# Patient Record
Sex: Female | Born: 1945 | Race: White | Hispanic: No | Marital: Married | Smoking: Current every day smoker
Health system: Southern US, Community
[De-identification: ages and names within clinical notes are randomized; demographics above are authoritative.]

## PROBLEM LIST (undated history)

## (undated) DIAGNOSIS — E785 Hyperlipidemia, unspecified: Secondary | ICD-10-CM

## (undated) DIAGNOSIS — F419 Anxiety disorder, unspecified: Secondary | ICD-10-CM

## (undated) DIAGNOSIS — I2510 Atherosclerotic heart disease of native coronary artery without angina pectoris: Secondary | ICD-10-CM

## (undated) DIAGNOSIS — Z9289 Personal history of other medical treatment: Secondary | ICD-10-CM

## (undated) DIAGNOSIS — G629 Polyneuropathy, unspecified: Secondary | ICD-10-CM

## (undated) DIAGNOSIS — E46 Unspecified protein-calorie malnutrition: Secondary | ICD-10-CM

## (undated) DIAGNOSIS — M810 Age-related osteoporosis without current pathological fracture: Secondary | ICD-10-CM

## (undated) DIAGNOSIS — I771 Stricture of artery: Secondary | ICD-10-CM

## (undated) DIAGNOSIS — M797 Fibromyalgia: Secondary | ICD-10-CM

## (undated) DIAGNOSIS — I10 Essential (primary) hypertension: Secondary | ICD-10-CM

## (undated) DIAGNOSIS — I6529 Occlusion and stenosis of unspecified carotid artery: Secondary | ICD-10-CM

## (undated) DIAGNOSIS — R55 Syncope and collapse: Secondary | ICD-10-CM

## (undated) DIAGNOSIS — J9620 Acute and chronic respiratory failure, unspecified whether with hypoxia or hypercapnia: Secondary | ICD-10-CM

## (undated) DIAGNOSIS — I5030 Unspecified diastolic (congestive) heart failure: Secondary | ICD-10-CM

## (undated) HISTORY — DX: Atherosclerotic heart disease of native coronary artery without angina pectoris: I25.10

## (undated) HISTORY — DX: Unspecified protein-calorie malnutrition: E46

## (undated) HISTORY — DX: Personal history of other medical treatment: Z92.89

## (undated) HISTORY — DX: Acute and chronic respiratory failure, unspecified whether with hypoxia or hypercapnia: J96.20

## (undated) HISTORY — DX: Occlusion and stenosis of unspecified carotid artery: I65.29

## (undated) HISTORY — DX: Age-related osteoporosis without current pathological fracture: M81.0

## (undated) HISTORY — DX: Polyneuropathy, unspecified: G62.9

## (undated) HISTORY — PX: OTHER SURGICAL HISTORY: SHX169

## (undated) HISTORY — DX: Anxiety disorder, unspecified: F41.9

## (undated) HISTORY — DX: Syncope and collapse: R55

## (undated) HISTORY — DX: Essential (primary) hypertension: I10

## (undated) HISTORY — DX: Fibromyalgia: M79.7

## (undated) HISTORY — DX: Hyperlipidemia, unspecified: E78.5

---

## 1998-06-06 ENCOUNTER — Other Ambulatory Visit: Admission: RE | Admit: 1998-06-06 | Discharge: 1998-06-06 | Payer: Self-pay | Admitting: *Deleted

## 1998-11-10 ENCOUNTER — Ambulatory Visit (HOSPITAL_COMMUNITY): Admission: RE | Admit: 1998-11-10 | Discharge: 1998-11-10 | Payer: Self-pay | Admitting: Family Medicine

## 2000-07-10 ENCOUNTER — Other Ambulatory Visit: Admission: RE | Admit: 2000-07-10 | Discharge: 2000-07-10 | Payer: Self-pay | Admitting: *Deleted

## 2001-03-03 ENCOUNTER — Encounter: Admission: RE | Admit: 2001-03-03 | Discharge: 2001-03-03 | Payer: Self-pay | Admitting: Family Medicine

## 2001-03-03 ENCOUNTER — Encounter: Payer: Self-pay | Admitting: Family Medicine

## 2001-05-27 ENCOUNTER — Other Ambulatory Visit: Admission: RE | Admit: 2001-05-27 | Discharge: 2001-05-27 | Payer: Self-pay | Admitting: *Deleted

## 2002-07-27 ENCOUNTER — Other Ambulatory Visit: Admission: RE | Admit: 2002-07-27 | Discharge: 2002-07-27 | Payer: Self-pay | Admitting: Family Medicine

## 2003-09-15 ENCOUNTER — Other Ambulatory Visit: Admission: RE | Admit: 2003-09-15 | Discharge: 2003-09-15 | Payer: Self-pay | Admitting: Family Medicine

## 2004-02-03 ENCOUNTER — Encounter: Admission: RE | Admit: 2004-02-03 | Discharge: 2004-02-03 | Payer: Self-pay | Admitting: Family Medicine

## 2005-03-27 ENCOUNTER — Other Ambulatory Visit: Admission: RE | Admit: 2005-03-27 | Discharge: 2005-03-27 | Payer: Self-pay | Admitting: Family Medicine

## 2006-06-25 ENCOUNTER — Ambulatory Visit: Payer: Self-pay | Admitting: Gastroenterology

## 2006-07-08 ENCOUNTER — Ambulatory Visit: Payer: Self-pay | Admitting: Gastroenterology

## 2006-07-08 ENCOUNTER — Encounter: Payer: Self-pay | Admitting: Gastroenterology

## 2006-09-04 ENCOUNTER — Emergency Department (HOSPITAL_COMMUNITY): Admission: EM | Admit: 2006-09-04 | Discharge: 2006-09-04 | Payer: Self-pay | Admitting: Emergency Medicine

## 2007-10-02 ENCOUNTER — Ambulatory Visit (HOSPITAL_COMMUNITY): Admission: RE | Admit: 2007-10-02 | Discharge: 2007-10-02 | Payer: Self-pay | Admitting: Internal Medicine

## 2009-08-15 DIAGNOSIS — R55 Syncope and collapse: Secondary | ICD-10-CM

## 2009-08-15 HISTORY — DX: Syncope and collapse: R55

## 2009-09-12 ENCOUNTER — Ambulatory Visit: Payer: Self-pay | Admitting: Cardiology

## 2009-09-28 ENCOUNTER — Telehealth (INDEPENDENT_AMBULATORY_CARE_PROVIDER_SITE_OTHER): Payer: Self-pay | Admitting: *Deleted

## 2009-10-26 ENCOUNTER — Encounter: Payer: Self-pay | Admitting: Cardiology

## 2009-10-26 ENCOUNTER — Ambulatory Visit: Payer: Self-pay | Admitting: Cardiovascular Disease

## 2009-10-26 ENCOUNTER — Ambulatory Visit: Payer: Self-pay

## 2009-10-26 ENCOUNTER — Ambulatory Visit (HOSPITAL_COMMUNITY): Admission: RE | Admit: 2009-10-26 | Discharge: 2009-10-26 | Payer: Self-pay | Admitting: Cardiology

## 2009-12-05 ENCOUNTER — Ambulatory Visit: Payer: Self-pay | Admitting: Cardiology

## 2009-12-29 ENCOUNTER — Ambulatory Visit: Payer: Self-pay

## 2009-12-29 ENCOUNTER — Ambulatory Visit: Payer: Self-pay | Admitting: Cardiology

## 2009-12-29 ENCOUNTER — Ambulatory Visit (HOSPITAL_COMMUNITY): Admission: RE | Admit: 2009-12-29 | Discharge: 2009-12-29 | Payer: Self-pay | Admitting: Cardiology

## 2009-12-29 ENCOUNTER — Encounter: Payer: Self-pay | Admitting: Cardiology

## 2010-11-16 NOTE — Assessment & Plan Note (Signed)
Summary: 3 month rov/sl   CC:  pt complains of being unbalanced.  History of Present Illness: Pleasant female I saw in November of 2010 for syncope. I felt at that time it was most likely either cough syncope or vasovagal. A stress echocardiogram was performed in January of 2011 and revealed poor exercise tolerance. There were electrocardiographic changes but there were no wall motion abnormalities and the LV function was normal. Since I saw her previously she feels unsteady at times but denies any dyspnea on exertion, orthopnea, PND, pedal edema, palpitations, syncope or chest pain.  Current Medications (verified): 1)  Lorazepam 1 Mg Tabs (Lorazepam) .Marland Kitchen.. 1 Tab By Mouth At Bedtime 2)  Vitamin D (Ergocalciferol) 50000 Unit Caps (Ergocalciferol) .... Weekly 3)  Aspirin 81 Mg Tbec (Aspirin) .... Take One Tablet By Mouth Daily When Pt Remebers  Past History:  Past Medical History: Reviewed history from 09/12/2009 and no changes required. hyperlipidemia history of anxiety fibromyalgia osteoporosis  Past Surgical History: Reviewed history from 09/12/2009 and no changes required. tonsillectomy  Social History: Reviewed history from 09/12/2009 and no changes required. Tobacco Use - Yes.  Alcohol Use - yes-occasional Drug Use - no Retired  Married   Review of Systems       Pains from Fibromyalgia but no fevers or chills, productive cough, hemoptysis, dysphasia, odynophagia, melena, hematochezia, dysuria, hematuria, rash, seizure activity, orthopnea, PND, pedal edema, claudication. Remaining systems are negative.   Vital Signs:  Patient profile:   65 year old female Height:      59 inches Weight:      92 pounds BMI:     18.65 Pulse rate:   89 / minute Resp:     12 per minute BP sitting:   100 / 63  (left arm)  Vitals Entered By: Burnett Kanaris (December 05, 2009 1:53 PM)  Physical Exam  General:  Well-developed well-nourished in no acute distress.  Skin is warm and dry.   HEENT is normal.  Neck is supple. No thyromegaly.  Chest is clear to auscultation with normal expansion.  Cardiovascular exam is regular rate and rhythm. 2/6 systolic murmur left sternal border. Abdominal exam nontender or distended. No masses palpated. Extremities show no edema. neuro grossly intact    Impression & Recommendations:  Problem # 1:  CARDIAC MURMUR (ICD-785.2) Will schedule full echocardiogram although it does not sound as though she has severe aortic stenosis. Orders: Echocardiogram (Echo)  Problem # 2:  TOBACCO ABUSE (ICD-305.1) Patient counseled on discontinuing for between 3-10 minutes.  Problem # 3:  SYNCOPE (ICD-780.2) No further episodes. I will see back in 6 months. If no further episodes at that time then we will see back as needed. If she does have recurrent episodes and she will need a monitor. Her updated medication list for this problem includes:    Aspirin 81 Mg Tbec (Aspirin) .Marland Kitchen... Take one tablet by mouth daily when pt remebers  Problem # 4:  PURE HYPERCHOLESTEROLEMIA (ICD-272.0)  Problem # 5:  FIBROMYALGIA (ICD-729.1)  Patient Instructions: 1)  Your physician recommends that you schedule a follow-up appointment in: 6 MONTHS 2)  Your physician has requested that you have an echocardiogram.  Echocardiography is a painless test that uses sound waves to create images of your heart. It provides your doctor with information about the size and shape of your heart and how well your heart's chambers and valves are working.  This procedure takes approximately one hour. There are no restrictions for this procedure.

## 2011-06-28 ENCOUNTER — Encounter: Payer: Self-pay | Admitting: Gastroenterology

## 2011-09-18 ENCOUNTER — Other Ambulatory Visit (HOSPITAL_COMMUNITY): Payer: Self-pay | Admitting: Internal Medicine

## 2011-09-18 DIAGNOSIS — R011 Cardiac murmur, unspecified: Secondary | ICD-10-CM

## 2011-09-19 ENCOUNTER — Ambulatory Visit (HOSPITAL_COMMUNITY): Payer: Medicare HMO | Attending: Cardiology | Admitting: Radiology

## 2011-09-19 DIAGNOSIS — I079 Rheumatic tricuspid valve disease, unspecified: Secondary | ICD-10-CM | POA: Insufficient documentation

## 2011-09-19 DIAGNOSIS — E785 Hyperlipidemia, unspecified: Secondary | ICD-10-CM | POA: Insufficient documentation

## 2011-09-19 DIAGNOSIS — R011 Cardiac murmur, unspecified: Secondary | ICD-10-CM | POA: Insufficient documentation

## 2011-09-19 DIAGNOSIS — I059 Rheumatic mitral valve disease, unspecified: Secondary | ICD-10-CM | POA: Insufficient documentation

## 2011-09-19 DIAGNOSIS — F17200 Nicotine dependence, unspecified, uncomplicated: Secondary | ICD-10-CM | POA: Insufficient documentation

## 2011-09-20 ENCOUNTER — Encounter (HOSPITAL_COMMUNITY): Payer: Self-pay | Admitting: Internal Medicine

## 2012-07-10 ENCOUNTER — Encounter: Payer: Self-pay | Admitting: Gastroenterology

## 2012-08-05 ENCOUNTER — Encounter: Payer: Self-pay | Admitting: Physician Assistant

## 2012-08-11 ENCOUNTER — Encounter: Payer: Self-pay | Admitting: Physician Assistant

## 2012-08-11 ENCOUNTER — Ambulatory Visit (INDEPENDENT_AMBULATORY_CARE_PROVIDER_SITE_OTHER): Payer: Medicare HMO | Admitting: Physician Assistant

## 2012-08-11 VITALS — BP 93/65 | HR 78 | Ht 59.0 in | Wt 93.1 lb

## 2012-08-11 DIAGNOSIS — Z720 Tobacco use: Secondary | ICD-10-CM

## 2012-08-11 DIAGNOSIS — R079 Chest pain, unspecified: Secondary | ICD-10-CM

## 2012-08-11 DIAGNOSIS — I959 Hypotension, unspecified: Secondary | ICD-10-CM

## 2012-08-11 DIAGNOSIS — F17200 Nicotine dependence, unspecified, uncomplicated: Secondary | ICD-10-CM

## 2012-08-11 NOTE — Progress Notes (Signed)
[address] [place], [place]  [address] Phone: [phone], Fax:  [phone]  Date:  08/11/2012   Name:  Alexandra House   DOB:  26-May-1946   MRN:  [id number]  PCP:  Janalyn Rouse, MD  Primary Cardiologist:  Dr. Kirk Ruths  Primary Electrophysiologist:  None    History of Present Illness: Alexandra House is a 66 y.o. female who returns for evaluation of chest pain.  She is an active smoker who has a hx of COPD, HL, syncope, depression, mild carotid stenosis. She was evaluated by Dr. Stanford Breed in 11/10 for syncope. ETT-echocardiogram 1/11 demonstrated poor exercise tolerance but no EKG changes and no wall motion abnormalities at submaximal stress. 2-D echo 12/12: EF 99991111, grade 2 diastolic dysfunction, mild MR. Recently seen by her PCP and noted to have symptoms consistent with peripheral neuropathy as well as atypical chest discomfort. She is referred back for further evaluation.  She had noted left-sided chest discomfort for one to 2 years. It seems to be more frequent recently. She can note it with activity. She can note it at rest as well. She can exert herself without chest symptoms. It is left-sided. Feels heavy. She denies any radiating symptoms, associated shortness of breath, nausea or diaphoresis. She denies any further syncopal episodes. She denies orthopnea, PND or edema. She has dyspnea with more extreme activities. She probably describes class IIb symptoms. She denies pleuritic chest pain. She does note occasional chest discomfort lying supine.  Labs (10/13):  TSH 2.62  Wt Readings from Last 3 Encounters:  08/11/12 93 lb 1.9 oz (42.239 kg)  12/05/09 92 lb (41.731 kg)  09/12/09 92 lb (41.731 kg)     Past Medical History  Diagnosis Date  . Syncope 08/2009    a. ETT-Echo 1/11:  poor ex tol, submax exercise, no WMA or ECG changes c/w ischemia;    . Hx of echocardiogram     a. Echo 12/12: EF 99991111, grade 2 diastolic dysfunction, mild MR.  Marland Kitchen  HLD (hyperlipidemia)   . Fibromyalgia   . Neuropathy   . Anxiety   . Osteoporosis   . Carotid stenosis     Current Outpatient Prescriptions  Medication Sig Dispense Refill  . albuterol (PROVENTIL HFA;VENTOLIN HFA) 108 (90 BASE) MCG/ACT inhaler Inhale 2 puffs into the lungs every 6 (six) hours as needed.      Marland Kitchen aspirin 81 MG tablet Take 81 mg by mouth daily.      . budesonide-formoterol (SYMBICORT) 160-4.5 MCG/ACT inhaler Inhale 2 puffs into the lungs as needed.      . calcium carbonate (OS-CAL) 600 MG TABS Take 600 mg by mouth 2 (two) times daily with a meal.      . denosumab (PROLIA) 60 MG/ML SOLN injection Inject 60 mg into the skin every 6 (six) months. Administer in upper arm, thigh, or abdomen      . DULoxetine (CYMBALTA) 30 MG capsule Take 30 mg by mouth daily.      Marland Kitchen EPINEPHrine (EPIPEN JR) 0.15 MG/0.3ML injection Inject 0.15 mg into the muscle as needed.      Marland Kitchen HYDROcodone-homatropine (HYCODAN) 5-1.5 MG/5ML syrup Take by mouth every 6 (six) hours as needed.      Marland Kitchen ibuprofen (ADVIL,MOTRIN) 200 MG tablet Take 200 mg by mouth every 6 (six) hours as needed.      Marland Kitchen LORazepam (ATIVAN) 1 MG tablet Take 1 mg by mouth 2 (two) times daily as needed.      Marland Kitchen  Vitamin D, Ergocalciferol, (DRISDOL) 50000 UNITS CAPS Take 50,000 Units by mouth every 7 (seven) days.        Allergies:   No Known Allergies  Social History:   reports that she has been smoking.  She does not have any smokeless tobacco history on file.   Family History:   family history includes CAD in her mother.   ROS:  Please see the history of present illness.   She has a nonproductive cough. She notes occasional wheezing. She denies melena, hematochezia, vomiting, diarrhea.   All other systems reviewed and negative.   PHYSICAL EXAM: VS:  BP 93/65  Pulse 78  Ht 4\' 11"  (1.499 m)  Wt 93 lb 1.9 oz (42.239 kg)  BMI 18.81 kg/m2 Well nourished, well developed, in no acute distress HEENT: normal Neck: no JVD Cardiac:  normal S1,  S2; RRR; 2/6 systolic murmur heard best at the RUSB Chest: Positive tenderness to palpation over the left chest Lungs:  clear to auscultation bilaterally, no wheezing, rhonchi or rales Abd: soft, nontender, no hepatomegaly Ext: no edema Skin: warm and dry Neuro:  CNs 2-12 intact, no focal abnormalities noted  EKG:  NSR, HR 78, normal axis, nonspecific ST-T wave changes      ASSESSMENT AND PLAN:  1. Chest Pain:   She has typical and atypical features. She has significant risk factors that include family history, smoking and dyslipidemia. I have recommended proceeding with a Lexiscan Myoview. She did not achieve target heart rate on the treadmill 2 years ago. She also has some pain with lying supine as well as a systolic murmur. Last echocardiogram demonstrated only mild mitral regurgitation and normal aortic valve. I will repeat an echocardiogram to rule out pericardial effusion.    2. Tobacco Abuse:   She knows she needs to quit.  She tells me she had a recent CXR with her PCP that was ok.  3. Hypotension:    She has not eaten much or had much to drink today.  She occasionally feels lightheaded.  I have encouraged her to drink plenty of fluid.  Check a bmet and cbc today.    4. Disposition:   Follow up with Dr. Kirk Ruths in one month.   Signed, Richardson Dopp, PA-C  3:50 PM 08/11/2012

## 2012-08-11 NOTE — Patient Instructions (Addendum)
Your physician has requested that you have a lexiscan myoview DX 786.50. For further information please visit [email or web address]. Please follow instruction sheet, as given.  Your physician has requested that you have an echocardiogram DX 786.50. Echocardiography is a painless test that uses sound waves to create images of your heart. It provides your doctor with information about the size and shape of your heart and how well your heart's chambers and valves are working. This procedure takes approximately one hour. There are no restrictions for this procedure.  Your physician recommends that you return for lab work in: [place], CBC W/DIFF  Your physician recommends that you schedule a follow-up appointment in: [place] DR. CRENSHAW

## 2012-08-12 LAB — CBC WITH DIFFERENTIAL/PLATELET
Basophils Relative: 1 % (ref 0.0–3.0)
HCT: 36 % (ref 36.0–46.0)
Hemoglobin: 11.8 g/dL — ABNORMAL LOW (ref 12.0–15.0)
Lymphocytes Relative: 37.7 % (ref 12.0–46.0)
Lymphs Abs: 4 10*3/uL (ref 0.7–4.0)
Monocytes Relative: 6.1 % (ref 3.0–12.0)
Neutro Abs: 5.2 10*3/uL (ref 1.4–7.7)
RBC: 4.07 Mil/uL (ref 3.87–5.11)

## 2012-08-12 LAB — BASIC METABOLIC PANEL
CO2: 28 mEq/L (ref 19–32)
Calcium: 9.1 mg/dL (ref 8.4–10.5)
GFR: 67.77 mL/min (ref >60.00)
Potassium: 4.4 mEq/L (ref 3.5–5.1)
Sodium: 139 mEq/L (ref 135–145)

## 2012-08-13 ENCOUNTER — Telehealth: Payer: Self-pay | Admitting: *Deleted

## 2012-08-13 ENCOUNTER — Telehealth: Payer: Self-pay | Admitting: Cardiology

## 2012-08-13 NOTE — Telephone Encounter (Signed)
Message copied by Michae Kava on Wed Aug 13, 2012  9:53 AM ------      Message from: [place], [place] T      Created: Tue Aug 12, 2012  1:54 PM       Hemoglobin just slightly low      O/w labs ok      Fax labs to PCP      Continue with current treatment plan.      Richardson Dopp, PA-C  1:54 PM 08/12/2012

## 2012-08-13 NOTE — Telephone Encounter (Signed)
lmptcb about lab results, I will fax results to PCP today

## 2012-08-13 NOTE — Telephone Encounter (Signed)
Spoke with pt, aware of lab results and copy mailed to pt at her request.

## 2012-08-13 NOTE — Telephone Encounter (Signed)
Pt returning nurse call, she can be reached at [phone]

## 2012-08-19 ENCOUNTER — Ambulatory Visit (HOSPITAL_COMMUNITY): Payer: Medicare HMO | Attending: Cardiology

## 2012-08-19 DIAGNOSIS — R072 Precordial pain: Secondary | ICD-10-CM

## 2012-08-19 DIAGNOSIS — J449 Chronic obstructive pulmonary disease, unspecified: Secondary | ICD-10-CM | POA: Insufficient documentation

## 2012-08-19 DIAGNOSIS — I369 Nonrheumatic tricuspid valve disorder, unspecified: Secondary | ICD-10-CM | POA: Insufficient documentation

## 2012-08-19 DIAGNOSIS — F17200 Nicotine dependence, unspecified, uncomplicated: Secondary | ICD-10-CM | POA: Insufficient documentation

## 2012-08-19 DIAGNOSIS — J4489 Other specified chronic obstructive pulmonary disease: Secondary | ICD-10-CM | POA: Insufficient documentation

## 2012-08-19 DIAGNOSIS — R079 Chest pain, unspecified: Secondary | ICD-10-CM

## 2012-08-19 NOTE — Progress Notes (Signed)
Echocardiogram performed.  

## 2012-08-20 ENCOUNTER — Telehealth: Payer: Self-pay | Admitting: *Deleted

## 2012-08-20 ENCOUNTER — Encounter: Payer: Self-pay | Admitting: Physician Assistant

## 2012-08-20 NOTE — Telephone Encounter (Signed)
pt notified about echo results with verbal understanding today

## 2012-08-20 NOTE — Telephone Encounter (Signed)
Message copied by Michae Kava on Wed Aug 20, 2012  5:59 PM ------      Message from: [place], [place] T      Created: Wed Aug 20, 2012  5:27 PM       Normal echo.      Richardson Dopp, PA-C  5:27 PM 08/20/2012

## 2012-08-25 ENCOUNTER — Encounter (HOSPITAL_COMMUNITY): Payer: Medicare HMO

## 2012-08-25 ENCOUNTER — Ambulatory Visit (HOSPITAL_COMMUNITY): Payer: Medicare HMO | Attending: Cardiology | Admitting: Radiology

## 2012-08-25 VITALS — BP 123/70 | Ht 59.0 in | Wt 93.0 lb

## 2012-08-25 DIAGNOSIS — R112 Nausea with vomiting, unspecified: Secondary | ICD-10-CM

## 2012-08-25 DIAGNOSIS — R079 Chest pain, unspecified: Secondary | ICD-10-CM

## 2012-08-25 DIAGNOSIS — R0602 Shortness of breath: Secondary | ICD-10-CM

## 2012-08-25 DIAGNOSIS — R9431 Abnormal electrocardiogram [ECG] [EKG]: Secondary | ICD-10-CM

## 2012-08-25 DIAGNOSIS — I4949 Other premature depolarization: Secondary | ICD-10-CM

## 2012-08-25 MED ORDER — TECHNETIUM TC 99M SESTAMIBI GENERIC - CARDIOLITE
10.0000 | Freq: Once | INTRAVENOUS | Status: AC | PRN
  Administered 2012-08-25: 10 via INTRAVENOUS

## 2012-08-25 MED ORDER — TECHNETIUM TC 99M SESTAMIBI GENERIC - CARDIOLITE
30.0000 | Freq: Once | INTRAVENOUS | Status: AC | PRN
  Administered 2012-08-25: 30 via INTRAVENOUS

## 2012-08-25 MED ORDER — REGADENOSON 0.4 MG/5ML IV SOLN
0.4000 mg | Freq: Once | INTRAVENOUS | Status: AC
  Administered 2012-08-25: 0.4 mg via INTRAVENOUS

## 2012-08-25 MED ORDER — AMINOPHYLLINE 25 MG/ML IV SOLN
75.0000 mg | Freq: Once | INTRAVENOUS | Status: AC
  Administered 2012-08-25: 75 mg via INTRAVENOUS

## 2012-08-25 NOTE — Progress Notes (Signed)
[facility] [id number] [place] [address] [phone]  Cardiology Nuclear Med Study  Alexandra House is a 66 y.o. female     MRN : [id number]     DOB: 1946/03/04  Procedure Date: 08/25/2012  Nuclear Med Background Indication for Stress Test:  Evaluation for Ischemia History:  Asthma, COPD, 5-21yrs ago [place];'11 Stress Echo:no ischemia and '12 Echo:EF=50-55% Cardiac Risk Factors: Family History - CAD, Lipids and Smoker  Symptoms:  Chest Pain/Pressure(last date of chest discomfort this am of 1-2/10), DOE and Fatigue   Nuclear Pre-Procedure Caffeine/Decaff Intake:  8:00pm NPO After: 8:00pm   Lungs:  clear O2 Sat: 98% on room air. IV 0.9% NS with Angio Cath:  22g  IV Site: R Hand x 1, tolerated well IV Started by:  Irven Baltimore, RN  Chest Size (in):  32 Cup Size: B  Height: 4\' 11"  (1.499 m)  Weight:  93 lb (42.185 kg)  BMI:  Body mass index is 18.78 kg/(m^2). Tech Comments:  n/a    Nuclear Med Study 1 or 2 day study: 1 day  Stress Test Type:  Treadmill/Lexiscan  Reading MD: Kirk Ruths, MD  Order Authorizing Provider:  Kirk Ruths, MD, and Richardson Dopp, Pac  Resting Radionuclide: Technetium 90m Sestamibi  Resting Radionuclide Dose: 11.0 mCi   Stress Radionuclide:  Technetium 76m Sestamibi  Stress Radionuclide Dose: 33.0 mCi           Stress Protocol Rest HR: 69 Stress HR: 120  Rest BP: 123/70 Stress BP: 113/71  Exercise Time (min): 1:30 METS: 1.5   Predicted Max HR: 152 bpm % Max HR: 78.95 bpm Rate Pressure Product: 14880   Dose of Adenosine (mg):  n/a Dose of Lexiscan: 0.4 mg  Dose of Atropine (mg): n/a Dose of Dobutamine: n/a mcg/kg/min (at max HR)  Stress Test Technologist: Matilde Haymaker, RN  Nuclear Technologist:  Annye Rusk, CNMT     Rest Procedure:  Myocardial perfusion imaging was performed at rest 45 minutes following the intravenous administration of  Technetium 6m Sestamibi. Rest ECG: Sinus rhythm  Stress Procedure:  The patient received IV Lexiscan 0.4 mg over 15-seconds with concurrent low level exercise and then Technetium 78m Sestamibi was injected at 30-seconds while the patient continued walking one more minute.  Patient had SOB and dizziness with infusion. Patient went into complete Heart block within 30 secs in recovery. Patient then converted to second degree heart block until 1mins into recovery. Patient had STT wave changes at 2 minutes of recovery. Patient had occasional PVC's,run of SVT and rare PAC in recovery.  Patient had nausea and vomited in recovery. Aminophylline 75 mg IV given in recovery. Patient Quantitative spect images were obtained after a 45-minute delay. Dr. Harrington Challenger consulted on patient with images and EKG.  Dr. Harrington Challenger orders to discharge patient. Stress ECG: Significant ST abnormalities consistent with ischemia.  QPS Raw Data Images:  There is interference from nuclear activity from structures below the diaphragm. This does not affect the ability to read the study. Stress Images:  Normal homogeneous uptake in all areas of the myocardium. Rest Images:  Normal homogeneous uptake in all areas of the myocardium. Subtraction (SDS):  No evidence of ischemia. Transient Ischemic Dilatation (Normal <1.22):  1.02 Lung/Heart Ratio (Normal <0.45):  0.35  Quantitative Gated Spect Images QGS EDV:  31 ml QGS ESV:  6 ml  Impression Exercise Capacity:  Lexiscan with low level exercise. BP  Response:  Hypotensive blood pressure response. Clinical Symptoms:  There is dyspnea. ECG Impression:  Significant ST abnormalities consistent with ischemia; patient developed transient CHB with infusion. Comparison with Prior Nuclear Study: No images to compare  Overall Impression:  Normal stress nuclear study; note ST changes in recovery and CHB with infusion.  LV Ejection Fraction: 81%.  LV Wall Motion:  NL LV Function; NL Wall  Motion  Kirk Ruths

## 2012-08-26 ENCOUNTER — Telehealth: Payer: Self-pay | Admitting: *Deleted

## 2012-08-26 ENCOUNTER — Encounter: Payer: Self-pay | Admitting: Physician Assistant

## 2012-08-26 NOTE — Telephone Encounter (Signed)
pt notified about myoview results and wants to make sure it is ok for her to leave for her trip Thursday 11/14. I told pt that I will check with Dr. Stanford Breed to make sure and that I w/cb before Thursday. pt said ok

## 2012-08-26 NOTE — Telephone Encounter (Signed)
Would have patient seen by PA in office in AM to make sure symptoms are stable prior to traveling (ECG changes with stress test). Kirk Ruths

## 2012-08-26 NOTE — Telephone Encounter (Signed)
Message copied by Michae Kava on Tue Aug 26, 2012 11:13 AM ------      Message from: [place], [place] T      Created: Tue Aug 26, 2012 10:13 AM       No ischemia.      Keep f/u with Dr. Kirk Ruths 12/2 to discuss further.      Richardson Dopp, PA-C  10:13 AM 08/26/2012

## 2012-08-28 ENCOUNTER — Ambulatory Visit (INDEPENDENT_AMBULATORY_CARE_PROVIDER_SITE_OTHER): Payer: Medicare HMO | Admitting: Physician Assistant

## 2012-08-28 ENCOUNTER — Encounter: Payer: Self-pay | Admitting: Physician Assistant

## 2012-08-28 VITALS — BP 118/84 | HR 78 | Ht 59.0 in | Wt 95.0 lb

## 2012-08-28 DIAGNOSIS — F17200 Nicotine dependence, unspecified, uncomplicated: Secondary | ICD-10-CM

## 2012-08-28 DIAGNOSIS — R079 Chest pain, unspecified: Secondary | ICD-10-CM | POA: Insufficient documentation

## 2012-08-28 MED ORDER — NITROGLYCERIN 0.4 MG SL SUBL: 0.4000 mg | SUBLINGUAL_TABLET | SUBLINGUAL | Status: DC | PRN

## 2012-08-28 NOTE — Patient Instructions (Addendum)
Your physician recommends that you schedule a follow-up appointment in: WITH DR. CRENSHAW ON FEBRUARY 11 AT 12:30PM  Your physician recommends that you continue on your current medications as directed. Please refer to the Current Medication list given to you today.

## 2012-08-28 NOTE — Progress Notes (Signed)
[address], [place], [place]  [address] Phone: [phone], Fax:  [phone]  Date:  08/28/2012   Name:  Alexandra House   DOB:  09-26-1946   MRN:  [id number]  PCP:  Janalyn Rouse, MD  Primary Cardiologist:  Dr. Kirk Ruths  Primary Electrophysiologist:  None    History of Present Illness: Alexandra House is a 66 y.o. female who returns for f/u of chest pain.  She is an active smoker who has a hx of COPD, HL, syncope, depression, mild carotid stenosis. She was evaluated by Dr. Stanford Breed in 11/10 for syncope. ETT-echocardiogram 1/11 demonstrated poor exercise tolerance but no EKG changes and no wall motion abnormalities at submaximal stress. 2-D echo 12/12: EF 99991111, grade 2 diastolic dysfunction, mild MR. Recently seen by her PCP and noted to have symptoms consistent with peripheral neuropathy as well as atypical chest discomfort. She was referred back for further evaluation. I saw her 08/11/12. She really described left-sided chest discomfort present for one to 2 years but more frequent recently. She noted pain with activity as well as at rest.  She also noted absence of pain with activity at times. Echocardiogram and stress test were performed. Echo demonstrated mild LVH and normal LV function. Myoview images demonstrated no ischemia and an EF of 81%. She had a low-level exercise Lexiscan study and did develop significant ST abnormalities consistent with ischemia and transient complete heart block with infusion. She denies any changes in her symptoms.  She denies significant dyspnea.  No syncope.  No orthopnea, edema.  She denies assoc dyspnea, arm or jaw pain, nausea or diaphoresis with her chest symptoms.   Labs (10/13):  TSH 2.62, K 4.4, creatinine 0.9, Hgb 11.8 (MCV 88.6)   Wt Readings from Last 3 Encounters:  08/28/12 95 lb (43.092 kg)  08/25/12 93 lb (42.185 kg)  08/11/12 93 lb 1.9 oz (42.239 kg)     Past Medical History  Diagnosis Date  . Syncope  08/2009    a. ETT-Echo 1/11:  poor ex tol, submax exercise, no WMA or ECG changes c/w ischemia;    . Hx of echocardiogram     a. Echo 12/12: EF 99991111, grade 2 diastolic dysfunction, mild MR.; b. Echo 11/13:  mild LVH, EF 60-65%  . HLD (hyperlipidemia)   . Fibromyalgia   . Neuropathy   . Anxiety   . Osteoporosis   . Carotid stenosis   . Hx of cardiovascular stress test     a. Lex MV 11/13: + significant ECG changes and CHB with infusion; EF 81%, no ischemia,     Current Outpatient Prescriptions  Medication Sig Dispense Refill  . albuterol (PROVENTIL HFA;VENTOLIN HFA) 108 (90 BASE) MCG/ACT inhaler Inhale 2 puffs into the lungs every 6 (six) hours as needed.      Marland Kitchen aspirin 81 MG tablet Take 81 mg by mouth daily.      . budesonide-formoterol (SYMBICORT) 160-4.5 MCG/ACT inhaler Inhale 2 puffs into the lungs as needed.      . calcium carbonate (OS-CAL) 600 MG TABS Take 600 mg by mouth 2 (two) times daily with a meal.      . denosumab (PROLIA) 60 MG/ML SOLN injection Inject 60 mg into the skin every 6 (six) months. Administer in upper arm, thigh, or abdomen      . DULoxetine (CYMBALTA) 30 MG capsule Take 30 mg by mouth daily.      Marland Kitchen EPINEPHrine (EPIPEN JR) 0.15 MG/0.3ML injection Inject 0.15 mg  into the muscle as needed.      Marland Kitchen HYDROcodone-homatropine (HYCODAN) 5-1.5 MG/5ML syrup Take by mouth every 6 (six) hours as needed.      Marland Kitchen ibuprofen (ADVIL,MOTRIN) 200 MG tablet Take 200 mg by mouth every 6 (six) hours as needed.      Marland Kitchen LORazepam (ATIVAN) 1 MG tablet Take 1 mg by mouth 2 (two) times daily as needed.      . Vitamin D, Ergocalciferol, (DRISDOL) 50000 UNITS CAPS Take 50,000 Units by mouth every 7 (seven) days.        Allergies:   No Known Allergies  Social History:   The patient  reports that she has been smoking.  She does not have any smokeless tobacco history on file.   PHYSICAL EXAM: VS:  BP 118/84  Pulse 78  Ht 4\' 11"  (1.499 m)  Wt 95 lb (43.092 kg)  BMI 19.19 kg/m2 Well  nourished, well developed, in no acute distress HEENT: normal Neck: no JVD Cardiac:  normal S1, S2; RRR Lungs:  clear to auscultation bilaterally, no wheezing, rhonchi or rales Abd: soft, nontender, no hepatomegaly Ext: no edema Skin: warm and dry Neuro:  CNs 2-12 intact, no focal abnormalities noted   ASSESSMENT AND PLAN:  1. Chest Pain:   Her symptoms are atypical.  She had some tenderness of her left chest when I saw her before.  I reviewed her stress test with Dr. Kirk Ruths today.  She had normal images but significant EKG changes and complete heart block with infusion of Lexiscan.  Although rare, CHB can occur with Lexiscan.  She does note significant nausea and vomiting with infusion.  Question if she had a vagal episode as well.  At this point, we plan no further cardiac workup.  I will provide her with a Rx for NTG.  I have instructed her how to use it.  She has been instructed to take her ASA daily.  She is cleared to go on her trip to [place] (leaves today).  She can follow up with Dr. Kirk Ruths in 3 mos.  Given the EKG changes on her stress test, we would have a low threshold to proceed with cardiac catheterization should her symptoms change.  We discussed the importance of early follow up should her symptoms change.    2. Tobacco Abuse:   She plans to quit smoking on her trip.    Danton Sewer, PA-C  8:31 AM 08/28/2012

## 2012-09-15 ENCOUNTER — Ambulatory Visit: Payer: Medicare HMO | Admitting: Cardiology

## 2012-11-25 ENCOUNTER — Ambulatory Visit: Payer: Medicare HMO | Admitting: Cardiology

## 2013-01-06 ENCOUNTER — Encounter: Payer: Self-pay | Admitting: Cardiology

## 2013-01-06 ENCOUNTER — Ambulatory Visit (INDEPENDENT_AMBULATORY_CARE_PROVIDER_SITE_OTHER): Payer: Medicare HMO | Admitting: Cardiology

## 2013-01-06 VITALS — BP 131/66 | HR 83 | Ht 59.0 in | Wt 102.4 lb

## 2013-01-06 DIAGNOSIS — R9430 Abnormal result of cardiovascular function study, unspecified: Secondary | ICD-10-CM | POA: Insufficient documentation

## 2013-01-06 DIAGNOSIS — R0989 Other specified symptoms and signs involving the circulatory and respiratory systems: Secondary | ICD-10-CM | POA: Insufficient documentation

## 2013-01-06 DIAGNOSIS — R011 Cardiac murmur, unspecified: Secondary | ICD-10-CM

## 2013-01-06 NOTE — Assessment & Plan Note (Signed)
Schedule carotid Dopplers for left carotid bruit.

## 2013-01-06 NOTE — Progress Notes (Signed)
HPI: Pleasant female for fu of chest pain. Also with h/o COPD, HL, syncope, depression, mild carotid stenosis. ETT-echocardiogram 1/11 demonstrated poor exercise tolerance but no EKG changes and no wall motion abnormalities at submaximal stress. 2-D echo 12/12: EF 99991111, grade 2 diastolic dysfunction, mild MR. Seen in Nov 2013 by Richardson Dopp for atypical CP. Echo demonstrated mild LVH and normal LV function. Myoview in Nov 2013 demonstrated no ischemia and an EF of 81%. She had a low-level exercise Lexiscan study and did develop significant ST abnormalities consistent with ischemia and transient complete heart block with infusion. She has been treated medically with low threshold for cath if recurrent symptoms. Since she was last seen,    Current Outpatient Prescriptions  Medication Sig Dispense Refill  . albuterol (PROVENTIL HFA;VENTOLIN HFA) 108 (90 BASE) MCG/ACT inhaler Inhale 2 puffs into the lungs every 6 (six) hours as needed.      Marland Kitchen aspirin 81 MG tablet Take 81 mg by mouth daily.      . budesonide-formoterol (SYMBICORT) 160-4.5 MCG/ACT inhaler Inhale 2 puffs into the lungs as needed.      . calcium carbonate (OS-CAL) 600 MG TABS Take 600 mg by mouth 2 (two) times daily with a meal.      . denosumab (PROLIA) 60 MG/ML SOLN injection Inject 60 mg into the skin every 6 (six) months. Administer in upper arm, thigh, or abdomen      . EPINEPHrine (EPIPEN JR) 0.15 MG/0.3ML injection Inject 0.15 mg into the muscle as needed.      Marland Kitchen HYDROcodone-homatropine (HYCODAN) 5-1.5 MG/5ML syrup Take by mouth every 6 (six) hours as needed.      Marland Kitchen ibuprofen (ADVIL,MOTRIN) 200 MG tablet Take 200 mg by mouth every 6 (six) hours as needed.      Marland Kitchen LORazepam (ATIVAN) 1 MG tablet Take 1 mg by mouth 2 (two) times daily as needed.      . nitroGLYCERIN (NITROSTAT) 0.4 MG SL tablet Place 1 tablet (0.4 mg total) under the tongue every 5 (five) minutes as needed for chest pain.  25 tablet  3  . Vitamin D, Ergocalciferol,  (DRISDOL) 50000 UNITS CAPS Take 50,000 Units by mouth every 7 (seven) days.       No current facility-administered medications for this visit.     Past Medical History  Diagnosis Date  . Syncope 08/2009    a. ETT-Echo 1/11:  poor ex tol, submax exercise, no WMA or ECG changes c/w ischemia;    . Hx of echocardiogram     a. Echo 12/12: EF 99991111, grade 2 diastolic dysfunction, mild MR.; b. Echo 11/13:  mild LVH, EF 60-65%  . HLD (hyperlipidemia)   . Fibromyalgia   . Neuropathy   . Anxiety   . Osteoporosis   . Carotid stenosis   . Hx of cardiovascular stress test     a. Lex MV 11/13: + significant ECG changes and CHB with infusion; EF 81%, no ischemia,     History reviewed. No pertinent past surgical history.  History   Social History  . Marital Status: Married    Spouse Name: N/A    Number of Children: N/A  . Years of Education: N/A   Occupational History  . Not on file.   Social History Main Topics  . Smoking status: Current Some Day Smoker  . Smokeless tobacco: Not on file  . Alcohol Use: Not on file  . Drug Use: Not on file  . Sexually Active: Not on file  Other Topics Concern  . Not on file   Social History Narrative  . No narrative on file    ROS: no fevers or chills, productive cough, hemoptysis, dysphasia, odynophagia, melena, hematochezia, dysuria, hematuria, rash, seizure activity, orthopnea, PND, pedal edema, claudication. Remaining systems are negative.  Physical Exam: Well-developed well-nourished in no acute distress.  Skin is warm and dry.  HEENT is normal.  Neck is supple. Left carotid bruit Chest is clear to auscultation with normal expansion.  Cardiovascular exam is regular rate and rhythm. 2/6 systolic ejection murmur  Abdominal exam nontender or distended. No masses palpated. Extremities show no edema. neuro grossly intact  Electrocardiogram shows sinus rhythm with nonspecific ST changes.

## 2013-01-06 NOTE — Patient Instructions (Addendum)
**Note De-Identified Alexandra House Obfuscation** Your physician has requested that you have a carotid duplex. This test is an ultrasound of the carotid arteries in your neck. It looks at blood flow through these arteries that supply the brain with blood. Allow one hour for this exam. There are no restrictions or special instructions.  Your physician recommends that you continue on your current medications as directed. Please refer to the Current Medication list given to you today.  Your physician recommends that you schedule a follow-up appointment in: 3 months

## 2013-01-06 NOTE — Assessment & Plan Note (Signed)
Patient counseled on discontinuing. 

## 2013-01-06 NOTE — Assessment & Plan Note (Signed)
Chest pain is atypical and actually improved. Previous functional study showed normal perfusion but EKG changes with low level exercise and Lexiscan. I reviewed options today including medical therapy versus catheterization. Plan to continue medical therapy for now. Consider cardiac catheterization if symptoms worsen. I will see her back in 3 months.

## 2013-01-14 ENCOUNTER — Encounter (INDEPENDENT_AMBULATORY_CARE_PROVIDER_SITE_OTHER): Payer: Medicare HMO

## 2013-01-14 DIAGNOSIS — R0989 Other specified symptoms and signs involving the circulatory and respiratory systems: Secondary | ICD-10-CM

## 2013-01-14 DIAGNOSIS — I6529 Occlusion and stenosis of unspecified carotid artery: Secondary | ICD-10-CM

## 2013-03-30 ENCOUNTER — Telehealth: Payer: Self-pay | Admitting: Gastroenterology

## 2013-03-30 NOTE — Telephone Encounter (Signed)
Blood in stool, requesting pt be seen. Pt scheduled to see Dr. Deatra Ina 04/01/13@9 :Brett Canales to fax records and notify pt of appt date and time.

## 2013-04-01 ENCOUNTER — Encounter: Payer: Self-pay | Admitting: Gastroenterology

## 2013-04-01 ENCOUNTER — Ambulatory Visit (INDEPENDENT_AMBULATORY_CARE_PROVIDER_SITE_OTHER): Payer: Medicare HMO | Admitting: Gastroenterology

## 2013-04-01 VITALS — BP 98/60 | HR 80 | Ht 59.0 in | Wt 98.4 lb

## 2013-04-01 DIAGNOSIS — K921 Melena: Secondary | ICD-10-CM

## 2013-04-01 DIAGNOSIS — K5289 Other specified noninfective gastroenteritis and colitis: Secondary | ICD-10-CM

## 2013-04-01 DIAGNOSIS — K529 Noninfective gastroenteritis and colitis, unspecified: Secondary | ICD-10-CM | POA: Insufficient documentation

## 2013-04-01 NOTE — Progress Notes (Signed)
History of Present Illness: Is a 67 year old white female referred at the request of Dr. Brigitte Pulse for evaluation of rectal bleeding. Approximately 4 days ago she developed acute onset of nausea protracted vomiting, lower abdominal pain and diarrhea. Her husband states that she had some blood in her vomitus. She had several bowel movements consisting of a small amount of bright red blood. Symptoms subsided after several hours. She's not had a bowel movement in 3 days. She complains of weakness. CBC one day following the acute illness was pertinent for white count of 11.6 and hemoglobin 13.1.  She had a colonoscopy several years ago but she does not specifically recall the date.    Past Medical History  Diagnosis Date  . Syncope 08/2009    a. ETT-Echo 1/11:  poor ex tol, submax exercise, no WMA or ECG changes c/w ischemia;    . Hx of echocardiogram     a. Echo 12/12: EF 99991111, grade 2 diastolic dysfunction, mild MR.; b. Echo 11/13:  mild LVH, EF 60-65%  . HLD (hyperlipidemia)   . Fibromyalgia   . Neuropathy   . Anxiety   . Osteoporosis   . Carotid stenosis   . Hx of cardiovascular stress test     a. Lex MV 11/13: + significant ECG changes and CHB with infusion; EF 81%, no ischemia,    History reviewed. No pertinent past surgical history. family history includes CAD in her mother. Current Outpatient Prescriptions  Medication Sig Dispense Refill  . calcium carbonate (OS-CAL) 600 MG TABS Take 600 mg by mouth 2 (two) times daily with a meal.      . denosumab (PROLIA) 60 MG/ML SOLN injection Inject 60 mg into the skin every 6 (six) months. Administer in upper arm, thigh, or abdomen      . EPINEPHrine (EPIPEN JR) 0.15 MG/0.3ML injection Inject 0.15 mg into the muscle as needed.      Marland Kitchen HYDROcodone-homatropine (HYCODAN) 5-1.5 MG/5ML syrup Take by mouth every 6 (six) hours as needed.      Marland Kitchen ibuprofen (ADVIL,MOTRIN) 200 MG tablet Take 200 mg by mouth every 6 (six) hours as needed.      Marland Kitchen LORazepam  (ATIVAN) 1 MG tablet Take 1 mg by mouth 2 (two) times daily as needed.      . nitroGLYCERIN (NITROSTAT) 0.4 MG SL tablet Place 1 tablet (0.4 mg total) under the tongue every 5 (five) minutes as needed for chest pain.  25 tablet  3  . Vitamin D, Ergocalciferol, (DRISDOL) 50000 UNITS CAPS Take 50,000 Units by mouth every 7 (seven) days.      Marland Kitchen albuterol (PROVENTIL HFA;VENTOLIN HFA) 108 (90 BASE) MCG/ACT inhaler Inhale 2 puffs into the lungs every 6 (six) hours as needed.      Marland Kitchen aspirin 81 MG tablet Take 81 mg by mouth daily.      . budesonide-formoterol (SYMBICORT) 160-4.5 MCG/ACT inhaler Inhale 2 puffs into the lungs as needed.       No current facility-administered medications for this visit.   Allergies as of 04/01/2013  . (No Known Allergies)    reports that she has been smoking.  She uses smokeless tobacco. She reports that she uses illicit drugs. She reports that she does not drink alcohol.     Review of Systems: Pertinent positive and negative review of systems were noted in the above HPI section. All other review of systems were otherwise negative.  Vital signs were reviewed in today's medical record Physical Exam: General: Well developed ,  well nourished, no acute distress Skin: anicteric Head: Normocephalic and atraumatic Eyes:  sclerae anicteric, EOMI Ears: Normal auditory acuity Mouth: No deformity or lesions Neck: Supple, no masses or thyromegaly Lungs: Clear throughout to auscultation Heart: Regular rate and rhythm; no murmurs, rubs or bruits Abdomen: Soft, non tender and non distended. No masses, hepatosplenomegaly or hernias noted. Normal Bowel sounds Rectal:deferred Musculoskeletal: Symmetrical with no gross deformities  Skin: No lesions on visible extremities Pulses:  Normal pulses noted Extremities: No clubbing, cyanosis, edema or deformities noted Neurological: Alert oriented x 4, grossly nonfocal Cervical Nodes:  No significant cervical adenopathy Inguinal  Nodes: No significant inguinal adenopathy Psychological:  Alert and cooperative. Normal mood and affect

## 2013-04-01 NOTE — Patient Instructions (Addendum)
Go to the basement and pick up your hemoccult kit Please have returned next week

## 2013-04-01 NOTE — Assessment & Plan Note (Addendum)
The patient had acute onset of nausea, vomiting,  abdominal pain and diarrhea complicated by  limited rectal bleeding and perhaps some blood in the vomitus. Symptoms are suggestive of either food poisoning or gastroenteritis. Rectal bleeding may be do to hemorrhoids. Abrupt onset and resolution of symptoms mitigate against ischemic colitis.  Recommendations #1 followup Hemoccults; if negative no further GI workup. Should Hemoccults be positive I will proceed with colonoscopy. #2 if she has not had colonoscopy for 10 years then she will be scheduled for an  elective procedure.

## 2013-04-02 ENCOUNTER — Other Ambulatory Visit (INDEPENDENT_AMBULATORY_CARE_PROVIDER_SITE_OTHER): Payer: Medicare HMO

## 2013-04-02 DIAGNOSIS — K921 Melena: Secondary | ICD-10-CM

## 2013-04-02 LAB — FECAL OCCULT BLOOD, IMMUNOCHEMICAL: Fecal Occult Bld: POSITIVE

## 2013-04-06 ENCOUNTER — Ambulatory Visit (INDEPENDENT_AMBULATORY_CARE_PROVIDER_SITE_OTHER): Payer: Medicare HMO | Admitting: Cardiology

## 2013-04-06 ENCOUNTER — Encounter: Payer: Self-pay | Admitting: Cardiology

## 2013-04-06 VITALS — BP 110/80 | HR 84 | Ht 59.0 in | Wt 100.0 lb

## 2013-04-06 DIAGNOSIS — E7800 Pure hypercholesterolemia, unspecified: Secondary | ICD-10-CM

## 2013-04-06 DIAGNOSIS — R9430 Abnormal result of cardiovascular function study, unspecified: Secondary | ICD-10-CM

## 2013-04-06 DIAGNOSIS — R079 Chest pain, unspecified: Secondary | ICD-10-CM

## 2013-04-06 DIAGNOSIS — F17200 Nicotine dependence, unspecified, uncomplicated: Secondary | ICD-10-CM

## 2013-04-06 DIAGNOSIS — I679 Cerebrovascular disease, unspecified: Secondary | ICD-10-CM

## 2013-04-06 MED ORDER — PRAVASTATIN SODIUM 40 MG PO TABS: 40.0000 mg | ORAL_TABLET | Freq: Every evening | ORAL | Status: DC

## 2013-04-06 NOTE — Patient Instructions (Addendum)
Your physician wants you to follow-up in: [place] will receive a reminder letter in the mail two months in advance. If you don't receive a letter, please call our office to schedule the follow-up appointment.   START PRAVASTATIN 40 MG ONCE DAILY AT BEDTIME  Your physician recommends that you return for lab work in: [place] = [place]

## 2013-04-06 NOTE — Assessment & Plan Note (Signed)
Continue aspirin. Add Pravachol 40 mg daily. Check lipids and liver in 6 weeks. Repeat carotid Dopplers April 2015.

## 2013-04-06 NOTE — Assessment & Plan Note (Signed)
Previous functional study showed normal perfusion but EKG changes with low level exercise and Lexiscan. Reviewed options previously including medical therapy versus catheterization. Plan to continue medical therapy for now. Consider cardiac catheterization if symptoms worsen. Continue aspirin and add statin.

## 2013-04-06 NOTE — Assessment & Plan Note (Signed)
Patient counseled on discontinuing. 

## 2013-04-06 NOTE — Progress Notes (Signed)
HPI: Pleasant female for fu of chest pain. Also with h/o COPD, HL, syncope, depression, mild carotid stenosis. ETT-echocardiogram 1/11 demonstrated poor exercise tolerance but no EKG changes and no wall motion abnormalities at submaximal stress. Seen in Nov 2013 by Richardson Dopp for atypical CP. Echo demonstrated mild LVH and normal LV function. Myoview in Nov 2013 demonstrated no ischemia and an EF of 81%. She had a low-level exercise Lexiscan study and did develop significant ST abnormalities consistent with ischemia and transient complete heart block with infusion. She has been treated medically with low threshold for cath if recurrent symptoms. Carotid dopplers in April of 2014 showed 40-59% bilateral stenosis and fu recommended in one year. Since she was last seen in March 2014, patient has occasional chest heaviness after exerting herself but not with exertion. She has not had dyspnea on exertion, orthopnea, pedal edema or syncope.   Current Outpatient Prescriptions  Medication Sig Dispense Refill  . albuterol (PROVENTIL HFA;VENTOLIN HFA) 108 (90 BASE) MCG/ACT inhaler Inhale 2 puffs into the lungs every 6 (six) hours as needed.      Marland Kitchen aspirin 81 MG tablet Take 81 mg by mouth daily.      . budesonide-formoterol (SYMBICORT) 160-4.5 MCG/ACT inhaler Inhale 2 puffs into the lungs as needed.      . calcium carbonate (OS-CAL) 600 MG TABS Take 600 mg by mouth 2 (two) times daily with a meal.      . denosumab (PROLIA) 60 MG/ML SOLN injection Inject 60 mg into the skin every 6 (six) months. Administer in upper arm, thigh, or abdomen      . EPINEPHrine (EPIPEN JR) 0.15 MG/0.3ML injection Inject 0.15 mg into the muscle as needed.      Marland Kitchen HYDROcodone-homatropine (HYCODAN) 5-1.5 MG/5ML syrup Take by mouth every 6 (six) hours as needed.      Marland Kitchen ibuprofen (ADVIL,MOTRIN) 200 MG tablet Take 200 mg by mouth every 6 (six) hours as needed.      Marland Kitchen LORazepam (ATIVAN) 1 MG tablet Take 1 mg by mouth 2 (two) times daily  as needed.      . nitroGLYCERIN (NITROSTAT) 0.4 MG SL tablet Place 1 tablet (0.4 mg total) under the tongue every 5 (five) minutes as needed for chest pain.  25 tablet  3  . Vitamin D, Ergocalciferol, (DRISDOL) 50000 UNITS CAPS Take 50,000 Units by mouth every 7 (seven) days.       No current facility-administered medications for this visit.     Past Medical History  Diagnosis Date  . Syncope 08/2009    a. ETT-Echo 1/11:  poor ex tol, submax exercise, no WMA or ECG changes c/w ischemia;    . Hx of echocardiogram     a. Echo 12/12: EF 99991111, grade 2 diastolic dysfunction, mild MR.; b. Echo 11/13:  mild LVH, EF 60-65%  . HLD (hyperlipidemia)   . Fibromyalgia   . Neuropathy   . Anxiety   . Osteoporosis   . Carotid stenosis   . Hx of cardiovascular stress test     a. Lex MV 11/13: + significant ECG changes and CHB with infusion; EF 81%, no ischemia,     History reviewed. No pertinent past surgical history.  History   Social History  . Marital Status: Married    Spouse Name: N/A    Number of Children: N/A  . Years of Education: N/A   Occupational History  . Not on file.   Social History Main Topics  . Smoking status: Current  Some Day Smoker  . Smokeless tobacco: Current User  . Alcohol Use: No  . Drug Use: Yes     Comment: vapor cig  . Sexually Active: Not on file   Other Topics Concern  . Not on file   Social History Narrative  . No narrative on file    ROS: no fevers or chills, productive cough, hemoptysis, dysphasia, odynophagia, melena, hematochezia, dysuria, hematuria, rash, seizure activity, orthopnea, PND, pedal edema, claudication. Remaining systems are negative.  Physical Exam: Well-developed well-nourished in no acute distress.  Skin is warm and dry.  HEENT is normal.  Neck is supple.  Chest is clear to auscultation with normal expansion.  Cardiovascular exam is regular rate and rhythm.  Abdominal exam nontender or distended. No masses  palpated. Extremities show no edema. neuro grossly intact  ECG sinus rhythm at a rate of 84. Nonspecific ST changes.

## 2013-04-06 NOTE — Assessment & Plan Note (Signed)
Add Pravachol 40 mg daily. Check lipids and liver in 6 weeks.

## 2013-04-13 ENCOUNTER — Ambulatory Visit (AMBULATORY_SURGERY_CENTER): Payer: Medicare HMO | Admitting: *Deleted

## 2013-04-13 VITALS — Ht 59.0 in | Wt 99.4 lb

## 2013-04-13 DIAGNOSIS — K921 Melena: Secondary | ICD-10-CM

## 2013-04-13 MED ORDER — NA SULFATE-K SULFATE-MG SULF 17.5-3.13-1.6 GM/177ML PO SOLN: ORAL | Status: DC

## 2013-04-15 ENCOUNTER — Encounter: Payer: Self-pay | Admitting: Gastroenterology

## 2013-04-23 ENCOUNTER — Ambulatory Visit (AMBULATORY_SURGERY_CENTER): Payer: Medicare HMO | Admitting: Gastroenterology

## 2013-04-23 ENCOUNTER — Encounter: Payer: Self-pay | Admitting: Gastroenterology

## 2013-04-23 ENCOUNTER — Other Ambulatory Visit: Payer: Self-pay | Admitting: *Deleted

## 2013-04-23 ENCOUNTER — Other Ambulatory Visit (INDEPENDENT_AMBULATORY_CARE_PROVIDER_SITE_OTHER): Payer: Medicare HMO

## 2013-04-23 ENCOUNTER — Encounter: Payer: Medicare HMO | Admitting: Gastroenterology

## 2013-04-23 VITALS — BP 107/60 | HR 74 | Temp 97.6°F | Resp 14 | Ht 59.0 in | Wt 99.0 lb

## 2013-04-23 DIAGNOSIS — K5730 Diverticulosis of large intestine without perforation or abscess without bleeding: Secondary | ICD-10-CM

## 2013-04-23 DIAGNOSIS — K921 Melena: Secondary | ICD-10-CM

## 2013-04-23 DIAGNOSIS — K648 Other hemorrhoids: Secondary | ICD-10-CM

## 2013-04-23 LAB — CBC WITH DIFFERENTIAL/PLATELET
Basophils Relative: 0.9 % (ref 0.0–3.0)
Eosinophils Relative: 4.5 % (ref 0.0–5.0)
Lymphocytes Relative: 28.7 % (ref 12.0–46.0)
MCV: 86.3 fl (ref 78.0–100.0)
Monocytes Absolute: 0.7 10*3/uL (ref 0.1–1.0)
Neutrophils Relative %: 58.4 % (ref 43.0–77.0)
Platelets: 328 10*3/uL (ref 150.0–400.0)
RBC: 4.34 Mil/uL (ref 3.87–5.11)
WBC: 8.9 10*3/uL (ref 4.5–10.5)

## 2013-04-23 MED ORDER — SODIUM CHLORIDE 0.9 % IV SOLN: 500.0000 mL | INTRAVENOUS | Status: DC

## 2013-04-23 NOTE — Patient Instructions (Addendum)
Discharge instructions given with verbal understanding. Handouts on diverticulosis and hemorrhoids given. Resume previous medications. YOU HAD AN ENDOSCOPIC PROCEDURE TODAY AT THE [facility]: Refer to the procedure report that was given to you for any specific questions about what was found during the examination.  If the procedure report does not answer your questions, please call your gastroenterologist to clarify.  If you requested that your care partner not be given the details of your procedure findings, then the procedure report has been included in a sealed envelope for you to review at your convenience later.  YOU SHOULD EXPECT: Some feelings of bloating in the abdomen. Passage of more gas than usual.  Walking can help get rid of the air that was put into your GI tract during the procedure and reduce the bloating. If you had a lower endoscopy (such as a colonoscopy or flexible sigmoidoscopy) you may notice spotting of blood in your stool or on the toilet paper. If you underwent a bowel prep for your procedure, then you may not have a normal bowel movement for a few days.  DIET: Your first meal following the procedure should be a light meal and then it is ok to progress to your normal diet.  A half-sandwich or bowl of soup is an example of a good first meal.  Heavy or fried foods are harder to digest and may make you feel nauseous or bloated.  Likewise meals heavy in dairy and vegetables can cause extra gas to form and this can also increase the bloating.  Drink plenty of fluids but you should avoid alcoholic beverages for 24 hours.  ACTIVITY: Your care partner should take you home directly after the procedure.  You should plan to take it easy, moving slowly for the rest of the day.  You can resume normal activity the day after the procedure however you should NOT DRIVE or use heavy machinery for 24 hours (because of the sedation medicines used during the test).    SYMPTOMS TO REPORT  IMMEDIATELY: A gastroenterologist can be reached at any hour.  During normal business hours, 8:30 AM to 5:00 PM Monday through Friday, call [phone].  After hours and on weekends, please call the GI answering service at [phone] who will take a message and have the physician on call contact you.   Following lower endoscopy (colonoscopy or flexible sigmoidoscopy):  Excessive amounts of blood in the stool  Significant tenderness or worsening of abdominal pains  Swelling of the abdomen that is new, acute  Fever of 100F or higher  FOLLOW UP: If any biopsies were taken you will be contacted by phone or by letter within the next 1-3 weeks.  Call your gastroenterologist if you have not heard about the biopsies in 3 weeks.  Our staff will call the home number listed on your records the next business day following your procedure to check on you and address any questions or concerns that you may have at that time regarding the information given to you following your procedure. This is a courtesy call and so if there is no answer at the home number and we have not heard from you through the emergency physician on call, we will assume that you have returned to your regular daily activities without incident.  SIGNATURES/CONFIDENTIALITY: You and/or your care partner have signed paperwork which will be entered into your electronic medical record.  These signatures attest to the fact that that the information above on your After Visit   Summary has been reviewed and is understood.  Full responsibility of the confidentiality of this discharge information lies with you and/or your care-partner.  Hemoccult cards given in recovery room.

## 2013-04-23 NOTE — Progress Notes (Signed)
Patient did not experience any of the following events: a burn prior to discharge; a fall within the facility; wrong site/side/patient/procedure/implant event; or a hospital transfer or hospital admission upon discharge from the facility. (G8907) Patient did not have preoperative order for IV antibiotic SSI prophylaxis. (G8918)  

## 2013-04-23 NOTE — Op Note (Signed)
[place]  [address] [place], [id number]   COLONOSCOPY PROCEDURE REPORT  PATIENT: [name], [name]  MR#: [id number] BIRTHDATE: 04/27/1946 , 67  yrs. old GENDER: Female ENDOSCOPIST: Inda Castle, MD REFERRED [id number] Dione Housekeeper, M.D. PROCEDURE DATE:  04/23/2013 PROCEDURE:   Colonoscopy, diagnostic ASA CLASS:   Class II INDICATIONS:Rectal Bleeding and heme-positive stool. MEDICATIONS: MAC sedation, administered by CRNA and Propofol (Diprivan) 70 mg IV  DESCRIPTION OF PROCEDURE:   After the risks benefits and alternatives of the procedure were thoroughly explained, informed consent was obtained.  A digital rectal exam revealed no abnormalities of the rectum.   The LB [id number]  endoscope was introduced through the anus and advanced to the cecum, which was identified by both the appendix and ileocecal valve. No adverse events experienced.   The quality of the prep was excellent using Suprep  The instrument was then slowly withdrawn as the colon was fully examined.      COLON FINDINGS: Moderate diverticulosis was noted in the sigmoid colon.   Internal hemorrhoids were found.   The colon mucosa was otherwise normal.  Retroflexed views revealed no abnormalities. The time to cecum=2 minutes 47 seconds.  Withdrawal time=6 minutes 11 seconds.  The scope was withdrawn and the procedure completed. COMPLICATIONS: There were no complications.  ENDOSCOPIC IMPRESSION: 1.   Moderate diverticulosis was noted in the sigmoid colon 2.   Internal hemorrhoids 3.   The colon mucosa was otherwise normal  RECOMMENDATIONS: Followup hemeoccults in 1 week Repeat CBC   eSigned:  Inda Castle, MD 04/23/2013 10:56 AM   cc:   PATIENT NAME:  Alexandra House, Alexandra House MR#: [id number]

## 2013-04-23 NOTE — Progress Notes (Signed)
Stable to RR 

## 2013-04-24 ENCOUNTER — Telehealth: Payer: Self-pay | Admitting: *Deleted

## 2013-04-24 NOTE — Telephone Encounter (Signed)
Telephone number provided,"invalid."

## 2013-05-21 ENCOUNTER — Other Ambulatory Visit (INDEPENDENT_AMBULATORY_CARE_PROVIDER_SITE_OTHER): Payer: Medicare HMO

## 2013-05-21 ENCOUNTER — Other Ambulatory Visit: Payer: Self-pay | Admitting: *Deleted

## 2013-05-21 DIAGNOSIS — D62 Acute posthemorrhagic anemia: Secondary | ICD-10-CM

## 2013-05-21 LAB — HEMOCCULT SLIDES (X 3 CARDS)
OCCULT 1: NEGATIVE
OCCULT 2: NEGATIVE
OCCULT 3: NEGATIVE
OCCULT 5: NEGATIVE

## 2013-05-25 NOTE — Progress Notes (Signed)
Quick Note:  Please inform the patient that hemeoccults were normal and to continue current plan of action ______

## 2014-06-16 ENCOUNTER — Other Ambulatory Visit (HOSPITAL_COMMUNITY): Payer: Self-pay | Admitting: Internal Medicine

## 2014-06-16 ENCOUNTER — Ambulatory Visit (INDEPENDENT_AMBULATORY_CARE_PROVIDER_SITE_OTHER)
Admission: RE | Admit: 2014-06-16 | Discharge: 2014-06-16 | Disposition: A | Discharge status: Home / Self Care | Payer: Medicare HMO | Source: Ambulatory Visit | Attending: Vascular Surgery | Admitting: Vascular Surgery

## 2014-06-16 ENCOUNTER — Other Ambulatory Visit: Payer: Self-pay | Admitting: Vascular Surgery

## 2014-06-16 ENCOUNTER — Ambulatory Visit (HOSPITAL_COMMUNITY)
Admission: RE | Admit: 2014-06-16 | Discharge: 2014-06-16 | Disposition: A | Discharge status: Home / Self Care | Payer: Medicare HMO | Source: Ambulatory Visit | Attending: Vascular Surgery | Admitting: Vascular Surgery

## 2014-06-16 DIAGNOSIS — I739 Peripheral vascular disease, unspecified: Secondary | ICD-10-CM | POA: Insufficient documentation

## 2014-06-16 DIAGNOSIS — I6529 Occlusion and stenosis of unspecified carotid artery: Secondary | ICD-10-CM

## 2014-06-16 DIAGNOSIS — F17200 Nicotine dependence, unspecified, uncomplicated: Secondary | ICD-10-CM | POA: Diagnosis not present

## 2014-06-16 DIAGNOSIS — I658 Occlusion and stenosis of other precerebral arteries: Secondary | ICD-10-CM | POA: Insufficient documentation

## 2014-06-16 DIAGNOSIS — E785 Hyperlipidemia, unspecified: Secondary | ICD-10-CM | POA: Insufficient documentation

## 2014-06-16 DIAGNOSIS — I7389 Other specified peripheral vascular diseases: Secondary | ICD-10-CM

## 2014-11-15 DIAGNOSIS — Z681 Body mass index (BMI) 19 or less, adult: Secondary | ICD-10-CM | POA: Diagnosis not present

## 2014-11-15 DIAGNOSIS — R05 Cough: Secondary | ICD-10-CM | POA: Diagnosis not present

## 2014-11-15 DIAGNOSIS — J449 Chronic obstructive pulmonary disease, unspecified: Secondary | ICD-10-CM | POA: Diagnosis not present

## 2014-12-23 DIAGNOSIS — E559 Vitamin D deficiency, unspecified: Secondary | ICD-10-CM | POA: Diagnosis not present

## 2014-12-23 DIAGNOSIS — Z681 Body mass index (BMI) 19 or less, adult: Secondary | ICD-10-CM | POA: Diagnosis not present

## 2014-12-23 DIAGNOSIS — M810 Age-related osteoporosis without current pathological fracture: Secondary | ICD-10-CM | POA: Diagnosis not present

## 2015-01-25 DIAGNOSIS — R51 Headache: Secondary | ICD-10-CM | POA: Diagnosis not present

## 2015-01-25 DIAGNOSIS — J42 Unspecified chronic bronchitis: Secondary | ICD-10-CM | POA: Diagnosis not present

## 2015-01-25 DIAGNOSIS — R05 Cough: Secondary | ICD-10-CM | POA: Diagnosis not present

## 2015-01-25 DIAGNOSIS — Z681 Body mass index (BMI) 19 or less, adult: Secondary | ICD-10-CM | POA: Diagnosis not present

## 2015-01-25 DIAGNOSIS — J019 Acute sinusitis, unspecified: Secondary | ICD-10-CM | POA: Diagnosis not present

## 2015-04-12 ENCOUNTER — Encounter: Payer: Self-pay | Admitting: Gastroenterology

## 2015-05-13 DIAGNOSIS — Z6820 Body mass index (BMI) 20.0-20.9, adult: Secondary | ICD-10-CM | POA: Diagnosis not present

## 2015-05-13 DIAGNOSIS — L989 Disorder of the skin and subcutaneous tissue, unspecified: Secondary | ICD-10-CM | POA: Diagnosis not present

## 2015-05-26 ENCOUNTER — Other Ambulatory Visit: Payer: Self-pay | Admitting: Family Medicine

## 2015-05-26 DIAGNOSIS — Z1231 Encounter for screening mammogram for malignant neoplasm of breast: Secondary | ICD-10-CM

## 2015-06-07 DIAGNOSIS — Z1231 Encounter for screening mammogram for malignant neoplasm of breast: Secondary | ICD-10-CM | POA: Diagnosis not present

## 2015-06-10 DIAGNOSIS — L821 Other seborrheic keratosis: Secondary | ICD-10-CM | POA: Diagnosis not present

## 2015-06-10 DIAGNOSIS — D225 Melanocytic nevi of trunk: Secondary | ICD-10-CM | POA: Diagnosis not present

## 2015-06-21 DIAGNOSIS — E785 Hyperlipidemia, unspecified: Secondary | ICD-10-CM | POA: Diagnosis not present

## 2015-06-21 DIAGNOSIS — Z79899 Other long term (current) drug therapy: Secondary | ICD-10-CM | POA: Diagnosis not present

## 2015-06-21 DIAGNOSIS — R7301 Impaired fasting glucose: Secondary | ICD-10-CM | POA: Diagnosis not present

## 2015-06-21 DIAGNOSIS — R8290 Unspecified abnormal findings in urine: Secondary | ICD-10-CM | POA: Diagnosis not present

## 2015-06-21 DIAGNOSIS — M810 Age-related osteoporosis without current pathological fracture: Secondary | ICD-10-CM | POA: Diagnosis not present

## 2015-06-21 DIAGNOSIS — N390 Urinary tract infection, site not specified: Secondary | ICD-10-CM | POA: Diagnosis not present

## 2015-06-28 DIAGNOSIS — Z23 Encounter for immunization: Secondary | ICD-10-CM | POA: Diagnosis not present

## 2015-06-28 DIAGNOSIS — G629 Polyneuropathy, unspecified: Secondary | ICD-10-CM | POA: Diagnosis not present

## 2015-06-28 DIAGNOSIS — J449 Chronic obstructive pulmonary disease, unspecified: Secondary | ICD-10-CM | POA: Diagnosis not present

## 2015-06-28 DIAGNOSIS — I6523 Occlusion and stenosis of bilateral carotid arteries: Secondary | ICD-10-CM | POA: Diagnosis not present

## 2015-06-28 DIAGNOSIS — R7301 Impaired fasting glucose: Secondary | ICD-10-CM | POA: Diagnosis not present

## 2015-06-28 DIAGNOSIS — J42 Unspecified chronic bronchitis: Secondary | ICD-10-CM | POA: Diagnosis not present

## 2015-06-28 DIAGNOSIS — Z1389 Encounter for screening for other disorder: Secondary | ICD-10-CM | POA: Diagnosis not present

## 2015-06-28 DIAGNOSIS — I739 Peripheral vascular disease, unspecified: Secondary | ICD-10-CM | POA: Diagnosis not present

## 2015-06-28 DIAGNOSIS — E785 Hyperlipidemia, unspecified: Secondary | ICD-10-CM | POA: Diagnosis not present

## 2015-06-28 DIAGNOSIS — Z0000 Encounter for general adult medical examination without abnormal findings: Secondary | ICD-10-CM | POA: Diagnosis not present

## 2015-07-01 DIAGNOSIS — J449 Chronic obstructive pulmonary disease, unspecified: Secondary | ICD-10-CM | POA: Diagnosis not present

## 2015-07-01 DIAGNOSIS — Z681 Body mass index (BMI) 19 or less, adult: Secondary | ICD-10-CM | POA: Diagnosis not present

## 2015-07-01 DIAGNOSIS — R0789 Other chest pain: Secondary | ICD-10-CM | POA: Diagnosis not present

## 2015-07-06 ENCOUNTER — Other Ambulatory Visit: Payer: Self-pay | Admitting: Internal Medicine

## 2015-07-06 DIAGNOSIS — R9389 Abnormal findings on diagnostic imaging of other specified body structures: Secondary | ICD-10-CM

## 2015-07-07 ENCOUNTER — Ambulatory Visit
Admission: RE | Admit: 2015-07-07 | Discharge: 2015-07-07 | Disposition: A | Discharge status: Home / Self Care | Payer: Commercial Managed Care - HMO | Source: Ambulatory Visit | Attending: Internal Medicine | Admitting: Internal Medicine

## 2015-07-07 DIAGNOSIS — R9389 Abnormal findings on diagnostic imaging of other specified body structures: Secondary | ICD-10-CM

## 2015-07-07 DIAGNOSIS — J439 Emphysema, unspecified: Secondary | ICD-10-CM | POA: Diagnosis not present

## 2015-07-07 DIAGNOSIS — R911 Solitary pulmonary nodule: Secondary | ICD-10-CM | POA: Diagnosis not present

## 2015-07-07 DIAGNOSIS — J189 Pneumonia, unspecified organism: Secondary | ICD-10-CM | POA: Diagnosis not present

## 2015-07-07 MED ORDER — IOPAMIDOL (ISOVUE-300) INJECTION 61%
75.0000 mL | Freq: Once | INTRAVENOUS | Status: AC | PRN
  Administered 2015-07-07: 75 mL via INTRAVENOUS

## 2015-07-12 DIAGNOSIS — Z1212 Encounter for screening for malignant neoplasm of rectum: Secondary | ICD-10-CM | POA: Diagnosis not present

## 2015-11-04 DIAGNOSIS — L989 Disorder of the skin and subcutaneous tissue, unspecified: Secondary | ICD-10-CM | POA: Diagnosis not present

## 2015-11-04 DIAGNOSIS — Z681 Body mass index (BMI) 19 or less, adult: Secondary | ICD-10-CM | POA: Diagnosis not present

## 2015-11-04 DIAGNOSIS — I6523 Occlusion and stenosis of bilateral carotid arteries: Secondary | ICD-10-CM | POA: Diagnosis not present

## 2015-11-04 DIAGNOSIS — R7301 Impaired fasting glucose: Secondary | ICD-10-CM | POA: Diagnosis not present

## 2015-11-04 DIAGNOSIS — R42 Dizziness and giddiness: Secondary | ICD-10-CM | POA: Diagnosis not present

## 2015-11-04 DIAGNOSIS — I739 Peripheral vascular disease, unspecified: Secondary | ICD-10-CM | POA: Diagnosis not present

## 2015-11-28 ENCOUNTER — Other Ambulatory Visit (HOSPITAL_COMMUNITY): Payer: Self-pay | Admitting: Internal Medicine

## 2015-11-28 DIAGNOSIS — R011 Cardiac murmur, unspecified: Secondary | ICD-10-CM

## 2015-11-28 DIAGNOSIS — R42 Dizziness and giddiness: Secondary | ICD-10-CM

## 2015-12-05 ENCOUNTER — Other Ambulatory Visit (HOSPITAL_COMMUNITY): Payer: Self-pay | Admitting: Internal Medicine

## 2015-12-05 ENCOUNTER — Ambulatory Visit (HOSPITAL_COMMUNITY)
Admission: RE | Admit: 2015-12-05 | Discharge: 2015-12-05 | Disposition: A | Discharge status: Home / Self Care | Payer: Commercial Managed Care - HMO | Source: Ambulatory Visit | Attending: Vascular Surgery | Admitting: Vascular Surgery

## 2015-12-05 DIAGNOSIS — M79673 Pain in unspecified foot: Secondary | ICD-10-CM | POA: Diagnosis not present

## 2015-12-05 DIAGNOSIS — E785 Hyperlipidemia, unspecified: Secondary | ICD-10-CM | POA: Insufficient documentation

## 2015-12-05 DIAGNOSIS — I739 Peripheral vascular disease, unspecified: Secondary | ICD-10-CM

## 2015-12-05 DIAGNOSIS — R938 Abnormal findings on diagnostic imaging of other specified body structures: Secondary | ICD-10-CM | POA: Insufficient documentation

## 2015-12-06 ENCOUNTER — Other Ambulatory Visit: Payer: Self-pay

## 2015-12-06 ENCOUNTER — Ambulatory Visit (HOSPITAL_COMMUNITY): Payer: Commercial Managed Care - HMO | Attending: Cardiology

## 2015-12-06 DIAGNOSIS — I5189 Other ill-defined heart diseases: Secondary | ICD-10-CM | POA: Insufficient documentation

## 2015-12-06 DIAGNOSIS — I340 Nonrheumatic mitral (valve) insufficiency: Secondary | ICD-10-CM | POA: Insufficient documentation

## 2015-12-06 DIAGNOSIS — E785 Hyperlipidemia, unspecified: Secondary | ICD-10-CM | POA: Insufficient documentation

## 2015-12-06 DIAGNOSIS — R42 Dizziness and giddiness: Secondary | ICD-10-CM | POA: Diagnosis not present

## 2015-12-06 DIAGNOSIS — Z720 Tobacco use: Secondary | ICD-10-CM | POA: Diagnosis not present

## 2015-12-06 DIAGNOSIS — Z8249 Family history of ischemic heart disease and other diseases of the circulatory system: Secondary | ICD-10-CM | POA: Diagnosis not present

## 2015-12-06 DIAGNOSIS — R011 Cardiac murmur, unspecified: Secondary | ICD-10-CM | POA: Insufficient documentation

## 2015-12-12 DIAGNOSIS — C44311 Basal cell carcinoma of skin of nose: Secondary | ICD-10-CM | POA: Diagnosis not present

## 2015-12-12 DIAGNOSIS — D485 Neoplasm of uncertain behavior of skin: Secondary | ICD-10-CM | POA: Diagnosis not present

## 2015-12-29 DIAGNOSIS — J209 Acute bronchitis, unspecified: Secondary | ICD-10-CM | POA: Diagnosis not present

## 2015-12-29 DIAGNOSIS — J019 Acute sinusitis, unspecified: Secondary | ICD-10-CM | POA: Diagnosis not present

## 2015-12-29 DIAGNOSIS — R05 Cough: Secondary | ICD-10-CM | POA: Diagnosis not present

## 2015-12-29 DIAGNOSIS — F17200 Nicotine dependence, unspecified, uncomplicated: Secondary | ICD-10-CM | POA: Diagnosis not present

## 2015-12-29 DIAGNOSIS — Z681 Body mass index (BMI) 19 or less, adult: Secondary | ICD-10-CM | POA: Diagnosis not present

## 2016-02-01 DIAGNOSIS — F17200 Nicotine dependence, unspecified, uncomplicated: Secondary | ICD-10-CM | POA: Diagnosis not present

## 2016-02-01 DIAGNOSIS — J301 Allergic rhinitis due to pollen: Secondary | ICD-10-CM | POA: Diagnosis not present

## 2016-02-01 DIAGNOSIS — R05 Cough: Secondary | ICD-10-CM | POA: Diagnosis not present

## 2016-02-01 DIAGNOSIS — Z681 Body mass index (BMI) 19 or less, adult: Secondary | ICD-10-CM | POA: Diagnosis not present

## 2016-02-01 DIAGNOSIS — J42 Unspecified chronic bronchitis: Secondary | ICD-10-CM | POA: Diagnosis not present

## 2016-02-01 DIAGNOSIS — J449 Chronic obstructive pulmonary disease, unspecified: Secondary | ICD-10-CM | POA: Diagnosis not present

## 2016-02-13 DIAGNOSIS — C44311 Basal cell carcinoma of skin of nose: Secondary | ICD-10-CM | POA: Diagnosis not present

## 2016-04-20 DIAGNOSIS — R05 Cough: Secondary | ICD-10-CM | POA: Diagnosis not present

## 2016-04-20 DIAGNOSIS — Z681 Body mass index (BMI) 19 or less, adult: Secondary | ICD-10-CM | POA: Diagnosis not present

## 2016-04-20 DIAGNOSIS — J209 Acute bronchitis, unspecified: Secondary | ICD-10-CM | POA: Diagnosis not present

## 2016-04-20 DIAGNOSIS — J449 Chronic obstructive pulmonary disease, unspecified: Secondary | ICD-10-CM | POA: Diagnosis not present

## 2016-06-22 DIAGNOSIS — R7301 Impaired fasting glucose: Secondary | ICD-10-CM | POA: Diagnosis not present

## 2016-06-22 DIAGNOSIS — E784 Other hyperlipidemia: Secondary | ICD-10-CM | POA: Diagnosis not present

## 2016-06-22 DIAGNOSIS — Z0000 Encounter for general adult medical examination without abnormal findings: Secondary | ICD-10-CM | POA: Diagnosis not present

## 2016-06-28 DIAGNOSIS — Z0000 Encounter for general adult medical examination without abnormal findings: Secondary | ICD-10-CM | POA: Diagnosis not present

## 2016-06-28 DIAGNOSIS — F329 Major depressive disorder, single episode, unspecified: Secondary | ICD-10-CM | POA: Diagnosis not present

## 2016-06-28 DIAGNOSIS — M810 Age-related osteoporosis without current pathological fracture: Secondary | ICD-10-CM | POA: Diagnosis not present

## 2016-06-28 DIAGNOSIS — E559 Vitamin D deficiency, unspecified: Secondary | ICD-10-CM | POA: Diagnosis not present

## 2016-06-28 DIAGNOSIS — E784 Other hyperlipidemia: Secondary | ICD-10-CM | POA: Diagnosis not present

## 2016-06-28 DIAGNOSIS — I6523 Occlusion and stenosis of bilateral carotid arteries: Secondary | ICD-10-CM | POA: Diagnosis not present

## 2016-06-28 DIAGNOSIS — I7389 Other specified peripheral vascular diseases: Secondary | ICD-10-CM | POA: Diagnosis not present

## 2016-06-28 DIAGNOSIS — J449 Chronic obstructive pulmonary disease, unspecified: Secondary | ICD-10-CM | POA: Diagnosis not present

## 2016-06-28 DIAGNOSIS — R7301 Impaired fasting glucose: Secondary | ICD-10-CM | POA: Diagnosis not present

## 2016-07-05 DIAGNOSIS — Z1231 Encounter for screening mammogram for malignant neoplasm of breast: Secondary | ICD-10-CM | POA: Diagnosis not present

## 2016-07-05 DIAGNOSIS — M810 Age-related osteoporosis without current pathological fracture: Secondary | ICD-10-CM | POA: Diagnosis not present

## 2016-07-05 DIAGNOSIS — Z803 Family history of malignant neoplasm of breast: Secondary | ICD-10-CM | POA: Diagnosis not present

## 2016-11-07 DIAGNOSIS — Z681 Body mass index (BMI) 19 or less, adult: Secondary | ICD-10-CM | POA: Diagnosis not present

## 2016-11-07 DIAGNOSIS — M810 Age-related osteoporosis without current pathological fracture: Secondary | ICD-10-CM | POA: Diagnosis not present

## 2016-11-07 DIAGNOSIS — R7301 Impaired fasting glucose: Secondary | ICD-10-CM | POA: Diagnosis not present

## 2016-12-28 ENCOUNTER — Other Ambulatory Visit: Payer: Self-pay | Admitting: Internal Medicine

## 2016-12-28 DIAGNOSIS — J449 Chronic obstructive pulmonary disease, unspecified: Secondary | ICD-10-CM | POA: Diagnosis not present

## 2016-12-28 DIAGNOSIS — F17200 Nicotine dependence, unspecified, uncomplicated: Secondary | ICD-10-CM | POA: Diagnosis not present

## 2016-12-28 DIAGNOSIS — M810 Age-related osteoporosis without current pathological fracture: Secondary | ICD-10-CM | POA: Diagnosis not present

## 2016-12-28 DIAGNOSIS — Z681 Body mass index (BMI) 19 or less, adult: Secondary | ICD-10-CM | POA: Diagnosis not present

## 2016-12-28 DIAGNOSIS — F17209 Nicotine dependence, unspecified, with unspecified nicotine-induced disorders: Secondary | ICD-10-CM

## 2016-12-28 DIAGNOSIS — E784 Other hyperlipidemia: Secondary | ICD-10-CM | POA: Diagnosis not present

## 2016-12-28 DIAGNOSIS — I7389 Other specified peripheral vascular diseases: Secondary | ICD-10-CM | POA: Diagnosis not present

## 2016-12-28 DIAGNOSIS — F3289 Other specified depressive episodes: Secondary | ICD-10-CM | POA: Diagnosis not present

## 2017-01-07 ENCOUNTER — Ambulatory Visit
Admission: RE | Admit: 2017-01-07 | Discharge: 2017-01-07 | Disposition: A | Discharge status: Home / Self Care | Payer: Medicare HMO | Source: Ambulatory Visit | Attending: Internal Medicine | Admitting: Internal Medicine

## 2017-01-07 DIAGNOSIS — R918 Other nonspecific abnormal finding of lung field: Secondary | ICD-10-CM | POA: Diagnosis not present

## 2017-01-07 DIAGNOSIS — F17209 Nicotine dependence, unspecified, with unspecified nicotine-induced disorders: Secondary | ICD-10-CM

## 2017-01-15 DIAGNOSIS — M810 Age-related osteoporosis without current pathological fracture: Secondary | ICD-10-CM | POA: Diagnosis not present

## 2017-01-22 DIAGNOSIS — F329 Major depressive disorder, single episode, unspecified: Secondary | ICD-10-CM | POA: Diagnosis not present

## 2017-03-06 DIAGNOSIS — F329 Major depressive disorder, single episode, unspecified: Secondary | ICD-10-CM | POA: Diagnosis not present

## 2017-03-21 DIAGNOSIS — Z681 Body mass index (BMI) 19 or less, adult: Secondary | ICD-10-CM | POA: Diagnosis not present

## 2017-03-21 DIAGNOSIS — R0781 Pleurodynia: Secondary | ICD-10-CM | POA: Diagnosis not present

## 2017-03-21 DIAGNOSIS — J42 Unspecified chronic bronchitis: Secondary | ICD-10-CM | POA: Diagnosis not present

## 2017-04-07 ENCOUNTER — Emergency Department (HOSPITAL_COMMUNITY): Payer: Medicare HMO

## 2017-04-07 ENCOUNTER — Encounter (HOSPITAL_COMMUNITY): Payer: Self-pay | Admitting: *Deleted

## 2017-04-07 ENCOUNTER — Emergency Department (HOSPITAL_COMMUNITY)
Admission: EM | Admit: 2017-04-07 | Discharge: 2017-04-07 | Disposition: A | Discharge status: Home / Self Care | Payer: Medicare HMO | Attending: Emergency Medicine | Admitting: Emergency Medicine

## 2017-04-07 DIAGNOSIS — F17210 Nicotine dependence, cigarettes, uncomplicated: Secondary | ICD-10-CM | POA: Insufficient documentation

## 2017-04-07 DIAGNOSIS — R404 Transient alteration of awareness: Secondary | ICD-10-CM | POA: Diagnosis not present

## 2017-04-07 DIAGNOSIS — R55 Syncope and collapse: Secondary | ICD-10-CM | POA: Insufficient documentation

## 2017-04-07 DIAGNOSIS — R112 Nausea with vomiting, unspecified: Secondary | ICD-10-CM | POA: Diagnosis not present

## 2017-04-07 DIAGNOSIS — J449 Chronic obstructive pulmonary disease, unspecified: Secondary | ICD-10-CM | POA: Diagnosis not present

## 2017-04-07 LAB — URINALYSIS, ROUTINE W REFLEX MICROSCOPIC
Bacteria, UA: NONE SEEN
Bilirubin Urine: NEGATIVE
Glucose, UA: NEGATIVE mg/dL
Hgb urine dipstick: NEGATIVE
KETONES UR: 5 mg/dL — AB
Nitrite: NEGATIVE
PH: 6 (ref 5.0–8.0)
Protein, ur: NEGATIVE mg/dL
Specific Gravity, Urine: 1.008 (ref 1.005–1.030)
Squamous Epithelial / LPF: NONE SEEN

## 2017-04-07 LAB — HEPATIC FUNCTION PANEL
ALBUMIN: 3 g/dL — AB (ref 3.5–5.0)
ALK PHOS: 98 U/L (ref 38–126)
ALT: 14 U/L (ref 14–54)
AST: 28 U/L (ref 15–41)
Bilirubin, Direct: 0.1 mg/dL (ref 0.1–0.5)
Indirect Bilirubin: 0.5 mg/dL (ref 0.3–0.9)
Total Bilirubin: 0.6 mg/dL (ref 0.3–1.2)
Total Protein: 7.2 g/dL (ref 6.5–8.1)

## 2017-04-07 LAB — I-STAT TROPONIN, ED
TROPONIN I, POC: 0.01 ng/mL (ref 0.00–0.08)
Troponin i, poc: 0.01 ng/mL (ref 0.00–0.08)

## 2017-04-07 LAB — CBC
HCT: 35.7 % — ABNORMAL LOW (ref 36.0–46.0)
HEMOGLOBIN: 11.7 g/dL — AB (ref 12.0–15.0)
MCH: 27.9 pg (ref 26.0–34.0)
MCHC: 32.8 g/dL (ref 30.0–36.0)
MCV: 85.2 fL (ref 78.0–100.0)
PLATELETS: 387 10*3/uL (ref 150–400)
RBC: 4.19 MIL/uL (ref 3.87–5.11)
RDW: 13.5 % (ref 11.5–15.5)
WBC: 13.5 10*3/uL — ABNORMAL HIGH (ref 4.0–10.5)

## 2017-04-07 LAB — BASIC METABOLIC PANEL
Anion gap: 9 (ref 5–15)
BUN: 12 mg/dL (ref 6–20)
CALCIUM: 7.8 mg/dL — AB (ref 8.9–10.3)
CO2: 20 mmol/L — AB (ref 22–32)
CREATININE: 1.02 mg/dL — AB (ref 0.44–1.00)
Chloride: 103 mmol/L (ref 101–111)
GFR calc Af Amer: >60 mL/min (ref >60)
GFR calc non Af Amer: 53 mL/min — ABNORMAL LOW (ref >60)
GLUCOSE: 116 mg/dL — AB (ref 65–99)
Potassium: 3.6 mmol/L (ref 3.5–5.1)
Sodium: 132 mmol/L — ABNORMAL LOW (ref 135–145)

## 2017-04-07 LAB — LIPASE, BLOOD: Lipase: 21 U/L (ref 11–51)

## 2017-04-07 MED ORDER — SODIUM CHLORIDE 0.9 % IV BOLUS (SEPSIS)
500.0000 mL | Freq: Once | INTRAVENOUS | Status: AC
  Administered 2017-04-07: 500 mL via INTRAVENOUS

## 2017-04-07 MED ORDER — LACTATED RINGERS IV BOLUS (SEPSIS)
1000.0000 mL | Freq: Once | INTRAVENOUS | Status: AC
  Administered 2017-04-07: 1000 mL via INTRAVENOUS

## 2017-04-07 MED ORDER — ONDANSETRON HCL 4 MG/2ML IJ SOLN
4.0000 mg | Freq: Once | INTRAMUSCULAR | Status: AC
  Administered 2017-04-07: 4 mg via INTRAVENOUS
  Filled 2017-04-07: qty 2

## 2017-04-07 MED ORDER — ONDANSETRON HCL 4 MG PO TABS: 4.0000 mg | ORAL_TABLET | Freq: Three times a day (TID) | ORAL | 0 refills | Status: DC | PRN

## 2017-04-07 MED ORDER — RANITIDINE HCL 150 MG PO CAPS: 150.0000 mg | ORAL_CAPSULE | Freq: Every day | ORAL | 0 refills | Status: DC

## 2017-04-07 NOTE — ED Triage Notes (Signed)
To ED via [facility] for eval after having syncopal episode while sitting in church. Pt has had n/v x4 days. Felt weak this am. Pt alert on arrival to the ED. No cp or sob

## 2017-04-07 NOTE — ED Provider Notes (Signed)
[place] DEPT Provider Note   CSN: [id number] Arrival date & time: 04/07/17  1241     History   Chief Complaint Chief Complaint  Patient presents with  . Near Syncope  . Weakness    HPI KENYOTTA DORFMAN is a 71 y.o. female.  EVELYNN HENCH is a 71 y.o. Female who presents to the emergency department via EMS with her husband and son after a suspected syncopal episode while sitting in church today. Son reports that she was sitting in church when her head tilted backwards and her eyes rolled to the back of her head and she did not speak to them for about 15 seconds. Patient does not remember the event. She tells me she's been feeling lightheaded today. She's had 5 days of nausea and vomiting, mostly after eating. She reports normal bowel movements and has been passing gas. She denies any abdominal pain. She does me she has lost about 5 pounds in 3 months. She denies previous abdominal surgeries. No treatments prior to arrival. No seizure-like activity. She denies fever, chest pain, shortness of breath, palpitations, numbness, tingling, weakness, headache, changes to her vision, abdominal pain, diarrhea, urinary symptoms, or rashes.   The history is provided by the patient, medical records and a relative. No language interpreter was used.  Near Syncope  Pertinent negatives include no chest pain, no abdominal pain, no headaches and no shortness of breath.  Weakness  Primary symptoms include no dizziness. Associated symptoms include vomiting. Pertinent negatives include no shortness of breath, no chest pain and no headaches.    Past Medical History:  Diagnosis Date  . Anxiety   . Carotid stenosis   . Fibromyalgia   . HLD (hyperlipidemia)   . Hx of cardiovascular stress test    a. Lex MV 11/13: + significant ECG changes and CHB with infusion; EF 81%, no ischemia,   . Hx of echocardiogram    a. Echo 12/12: EF 57-84%, grade 2 diastolic dysfunction, mild MR.; b. Echo 11/13:  mild  LVH, EF 60-65%  . Neuropathy   . Osteoporosis   . Syncope 08/2009   a. ETT-Echo 1/11:  poor ex tol, submax exercise, no WMA or ECG changes c/w ischemia;      Patient Active Problem List   Diagnosis Date Noted  . Cerebrovascular disease 04/06/2013  . Noninfectious gastroenteritis and colitis 04/01/2013  . Bruit 01/06/2013  . Nonspecific abnormal unspecified cardiovascular function study 01/06/2013  . Chest pain 08/28/2012  . CARDIAC MURMUR 12/05/2009  . PURE HYPERCHOLESTEROLEMIA 09/12/2009  . TOBACCO ABUSE 09/12/2009  . FIBROMYALGIA 09/12/2009  . SYNCOPE 09/12/2009    History reviewed. No pertinent surgical history.  OB History    No data available       Home Medications    Prior to Admission medications   Medication Sig Start Date End Date Taking? Authorizing Provider  albuterol (PROVENTIL HFA;VENTOLIN HFA) 108 (90 BASE) MCG/ACT inhaler Inhale 2 puffs into the lungs every 6 (six) hours as needed for wheezing or shortness of breath.    Yes [provider]  BREO ELLIPTA 100-25 MCG/INH AEPB Inhale 1 puff into the lungs daily. 03/08/17  Yes [provider]  denosumab (PROLIA) 60 MG/ML SOLN injection Inject 60 mg into the skin every 6 (six) months. Administer in upper arm, thigh, or abdomen   Yes [provider]  HYDROcodone-homatropine (HYCODAN) 5-1.5 MG/5ML syrup Take 5 mLs by mouth every 6 (six) hours as needed for cough.    Yes [provider]  ibuprofen (ADVIL,MOTRIN) 200 MG tablet Take 200-400 mg by mouth every 6 (six) hours as needed for mild pain.    Yes [provider]  LORazepam (ATIVAN) 1 MG tablet Take 1 mg by mouth 2 (two) times daily as needed for anxiety.    Yes [provider]  pravastatin (PRAVACHOL) 40 MG tablet Take 1 tablet (40 mg total) by mouth every evening. 04/06/13 04/07/17 Yes Crenshaw, Denice Bors, MD  budesonide-formoterol [facility]) 160-4.5 MCG/ACT inhaler Inhale 2 puffs into the lungs as needed.     [provider]  calcium carbonate (OS-CAL) 600 MG TABS Take 600 mg by mouth 2 (two) times daily with a meal.    [provider]  EPINEPHrine (EPIPEN JR) 0.15 MG/0.3ML injection Inject 0.15 mg into the muscle as needed for anaphylaxis.     [provider]  nitroGLYCERIN (NITROSTAT) 0.4 MG SL tablet Place 1 tablet (0.4 mg total) under the tongue every 5 (five) minutes as needed for chest pain. 08/28/12 08/28/13  Liliane Shi, PA-C    Family History Family History  Problem Relation Age of Onset  . CAD Mother        Status post PCI  . Colon cancer Neg Hx     Social History Social History  Substance Use Topics  . Smoking status: Current Every Day Smoker    Packs/day: 0.50    Types: Cigarettes  . Smokeless tobacco: Current User     Comment: vapor cig.  Marland Kitchen Alcohol use Yes     Comment: glass wine once every 6 mos. per patient     Allergies   Bee venom   Review of Systems Review of Systems  Constitutional: Positive for fatigue. Negative for chills and fever.  HENT: Negative for congestion and sore throat.   Eyes: Negative for visual disturbance.  Respiratory: Negative for cough, shortness of breath and wheezing.   Cardiovascular: Positive for near-syncope. Negative for chest pain and palpitations.  Gastrointestinal: Positive for nausea and vomiting. Negative for abdominal pain, blood in stool and diarrhea.  Genitourinary: Negative for difficulty urinating, dysuria, hematuria and urgency.  Musculoskeletal: Negative for back pain and neck pain.  Skin: Negative for rash.  Neurological: Positive for syncope and light-headedness. Negative for dizziness, weakness, numbness and headaches.     Physical Exam Updated Vital Signs BP (!) 101/59   Pulse 77   Temp 98.4 F (36.9 C) (Oral)   Resp 16   SpO2 100%   Physical Exam  Constitutional: She is oriented to person, place, and time. She appears well-developed and well-nourished. No distress.  Nontoxic  appearing.  HENT:  Head: Normocephalic and atraumatic.  Mouth/Throat: Oropharynx is clear and moist.  Eyes: Conjunctivae and EOM are normal. Pupils are equal, round, and reactive to light. Right eye exhibits no discharge. Left eye exhibits no discharge.  Neck: Normal range of motion. Neck supple. No JVD present.  Cardiovascular: Normal rate, regular rhythm and intact distal pulses.  Exam reveals no gallop and no friction rub.   Murmur heard. Pulmonary/Chest: Effort normal and breath sounds normal. No stridor. No respiratory distress. She has no wheezes. She has no rales.  Lungs are clear to ascultation bilaterally. Symmetric chest expansion bilaterally. No increased work of breathing. No rales or rhonchi.    Abdominal: Soft. Bowel sounds are normal. She exhibits no distension. There is no tenderness. There is no guarding.  Abdomen is soft and non-tender to palpation.   Musculoskeletal: Normal range of motion. She exhibits no  edema or tenderness.  Lymphadenopathy:    She has no cervical adenopathy.  Neurological: She is alert and oriented to person, place, and time. No cranial nerve deficit or sensory deficit. She exhibits normal muscle tone. Coordination normal.  Patient is alert and oriented 3. Cranial nerves are intact. Speech is clear and coherent. No pronator drift. Finger to nose intact bilaterally. EOMs are intact bilaterally.  Strength and sensation is intact to her bilateral upper and lower extremities.  Skin: Skin is warm and dry. Capillary refill takes less than 2 seconds. No rash noted. She is not diaphoretic. No erythema. No pallor.  Psychiatric: She has a normal mood and affect. Her behavior is normal.  Nursing note and vitals reviewed.    ED Treatments / Results  Labs (all labs ordered are listed, but only abnormal results are displayed) Labs Reviewed  BASIC METABOLIC PANEL - Abnormal; Notable for the following:       Result Value   Sodium 132 (*)    CO2 20 (*)     Glucose, Bld 116 (*)    Creatinine, Ser 1.02 (*)    Calcium 7.8 (*)    GFR calc non Af Amer 53 (*)    All other components within normal limits  CBC - Abnormal; Notable for the following:    WBC 13.5 (*)    Hemoglobin 11.7 (*)    HCT 35.7 (*)    All other components within normal limits  HEPATIC FUNCTION PANEL - Abnormal; Notable for the following:    Albumin 3.0 (*)    All other components within normal limits  LIPASE, BLOOD  URINALYSIS, ROUTINE W REFLEX MICROSCOPIC  I-STAT TROPOININ, ED    EKG  EKG Interpretation  Date/Time:  Sunday April 07 2017 13:09:10 EDT Ventricular Rate:  84 PR Interval:    QRS Duration: 96 QT Interval:  392 QTC Calculation: 464 R Axis:   87 Text Interpretation:  Sinus rhythm Borderline right axis deviation Borderline repolarization abnormality No significant change since last tracing Confirmed by Gareth Morgan [phone]) on 04/07/2017 4:09:49 PM       Radiology Dg Chest 2 View  Result Date: 04/07/2017 CLINICAL DATA:  Pt states that she was at church this morning and passed out. Pt doesn't remember what happened prior to that or afterwards. Pt states that she has felt weak today because she has been sick and vomiting since Tuesday. Due to N/V, pt has not been able to keep hardly anything down. Pt reports that her back has been hurting this week as well. Non smoker. EXAM: CHEST  2 VIEW COMPARISON:  09/04/2006.  Chest CT, 01/07/2017. FINDINGS: Irregularly thickened interstitial markings bilaterally are stable from the recent prior CT, increased when compared to the prior chest radiographs. There is upper lobe pleuroparenchymal scarring, also stable. Lungs are hyperexpanded. No evidence of pneumonia. No pulmonary edema. Cardiac silhouette is normal in size. No mediastinal or hilar masses. No convincing adenopathy. Skeletal structures are demineralized but intact. IMPRESSION: 1. No acute cardiopulmonary disease. 2. COPD and interstitial lung disease.  Electronically Signed   By: Lajean Manes M.D.   On: 04/07/2017 15:47    Procedures Procedures (including critical care time)  Medications Ordered in ED Medications  lactated ringers bolus 1,000 mL (0 mLs Intravenous Stopped 04/07/17 1624)  ondansetron (ZOFRAN) injection 4 mg (4 mg Intravenous Given 04/07/17 1409)  sodium chloride 0.9 % bolus 500 mL (500 mLs Intravenous New Bag/Given 04/07/17 1615)     Initial Impression / Assessment and  Plan / ED Course  I have reviewed the triage vital signs and the nursing notes.  Pertinent labs & imaging results that were available during my care of the patient were reviewed by me and considered in my medical decision making (see chart for details).     This is a 71 y.o. Female who presents to the emergency department via EMS with her husband and son after a suspected syncopal episode while sitting in church today. Son reports that she was sitting in church when her head tilted backwards and her eyes rolled to the back of her head and she did not speak to them for about 15 seconds. Patient does not remember the event. She tells me she's been feeling lightheaded today. She's had 5 days of nausea and vomiting, mostly after eating. She reports normal bowel movements and has been passing gas. She denies any abdominal pain. She denies chest pain and shortness of breath.  On exam the patient is afebrile nontoxic appearing. She has no focal neurological deficits. EKG shows no significant change from her last tracing. Abdomen is soft and nontender to palpation. Initial troponin is not elevated. Lipase is not elevated. Hepatic function panel is unremarkable. BMP is remarkable for mildly elevated creatinine of 1.02. CBC is remarkable for white count of 13,500. Hemoglobin is around her baseline at 11.7. Chest x-ray shows COPD without acute cardiopulmonary disease.  At shift change patient is awaiting urinalysis and repeat troponin. Plan is for repeat troponin at 5:15.  If these are unremarkable and patient is no longer feeling lightheaded with position change and can tolerate by mouth, plan is for discharge likely. Patient care signed out to Payton Emerald,  MD at shift change who will disposition the patient.   This patient was discussed with and evaluated by Dr. Billy Fischer who agrees with assessment and plan.   Final Clinical Impressions(s) / ED Diagnoses   Final diagnoses:  Syncope and collapse  Non-intractable vomiting with nausea, unspecified vomiting type    New Prescriptions New Prescriptions   No medications on file     Waynetta Pean, Hershal Coria 04/07/17 1631    Gareth Morgan, MD 04/10/17 [phone]

## 2017-04-07 NOTE — ED Notes (Signed)
Patient transported to X-ray 

## 2017-04-07 NOTE — ED Notes (Signed)
ED Provider at bedside. 

## 2017-04-07 NOTE — Discharge Instructions (Signed)
Please begin taking ranitidine (zantac) once daily. Please followup closely with primary doctor. Please return for worsening symptoms.

## 2017-04-07 NOTE — ED Notes (Signed)
Family reports while sitting in church pt head went back, hitting the pew and her eyes were fixed on the ceiling. Family reports this episode last about 15 seconds.

## 2017-04-07 NOTE — ED Provider Notes (Signed)
Pt care received from Will Porfirio Mylar, [place].   71 yo female with 4x days of N/V who presents after syncopal episode while in church. Serial troponin wnl. UA w/o evidence of UTI. Pt is tolerating PO at bedside without additional vomiting. She ambulates without difficulty. Suspect syncope related to orthostatic hypotension in setting of fluid losses and poor PO intake. She will continue oral fluid hydration and return for worsening symptoms.  Return precautions provided for worsening symptoms. Pt will f/u with PCP at first availability. Pt verbalized agreement with plan.   Payton Emerald, MD 04/08/17 3317    Sharlett Iles, MD 04/08/17 1239

## 2017-04-11 DIAGNOSIS — R55 Syncope and collapse: Secondary | ICD-10-CM | POA: Diagnosis not present

## 2017-04-11 DIAGNOSIS — I6523 Occlusion and stenosis of bilateral carotid arteries: Secondary | ICD-10-CM | POA: Diagnosis not present

## 2017-04-11 DIAGNOSIS — D649 Anemia, unspecified: Secondary | ICD-10-CM | POA: Diagnosis not present

## 2017-04-11 DIAGNOSIS — Z681 Body mass index (BMI) 19 or less, adult: Secondary | ICD-10-CM | POA: Diagnosis not present

## 2017-04-11 DIAGNOSIS — R05 Cough: Secondary | ICD-10-CM | POA: Diagnosis not present

## 2017-04-11 DIAGNOSIS — E46 Unspecified protein-calorie malnutrition: Secondary | ICD-10-CM | POA: Diagnosis not present

## 2017-04-14 ENCOUNTER — Encounter (HOSPITAL_COMMUNITY): Payer: Self-pay | Admitting: *Deleted

## 2017-04-14 ENCOUNTER — Emergency Department (HOSPITAL_COMMUNITY): Payer: Medicare HMO

## 2017-04-14 ENCOUNTER — Emergency Department (HOSPITAL_COMMUNITY)
Admission: EM | Admit: 2017-04-14 | Discharge: 2017-04-14 | Disposition: A | Discharge status: Home / Self Care | Payer: Medicare HMO | Attending: Emergency Medicine | Admitting: Emergency Medicine

## 2017-04-14 DIAGNOSIS — F17210 Nicotine dependence, cigarettes, uncomplicated: Secondary | ICD-10-CM | POA: Diagnosis not present

## 2017-04-14 DIAGNOSIS — K56609 Unspecified intestinal obstruction, unspecified as to partial versus complete obstruction: Secondary | ICD-10-CM | POA: Diagnosis not present

## 2017-04-14 DIAGNOSIS — R0602 Shortness of breath: Secondary | ICD-10-CM | POA: Diagnosis not present

## 2017-04-14 DIAGNOSIS — Z79899 Other long term (current) drug therapy: Secondary | ICD-10-CM | POA: Insufficient documentation

## 2017-04-14 DIAGNOSIS — R05 Cough: Secondary | ICD-10-CM | POA: Diagnosis not present

## 2017-04-14 DIAGNOSIS — R112 Nausea with vomiting, unspecified: Secondary | ICD-10-CM | POA: Diagnosis present

## 2017-04-14 DIAGNOSIS — G43A0 Cyclical vomiting, not intractable: Secondary | ICD-10-CM

## 2017-04-14 LAB — COMPREHENSIVE METABOLIC PANEL
ALT: 21 U/L (ref 14–54)
AST: 46 U/L — AB (ref 15–41)
Albumin: 2.5 g/dL — ABNORMAL LOW (ref 3.5–5.0)
Alkaline Phosphatase: 148 U/L — ABNORMAL HIGH (ref 38–126)
Anion gap: 7 (ref 5–15)
BUN: 17 mg/dL (ref 6–20)
CHLORIDE: 103 mmol/L (ref 101–111)
CO2: 21 mmol/L — AB (ref 22–32)
CREATININE: 1.08 mg/dL — AB (ref 0.44–1.00)
Calcium: 8.1 mg/dL — ABNORMAL LOW (ref 8.9–10.3)
GFR calc Af Amer: 58 mL/min — ABNORMAL LOW (ref >60)
GFR calc non Af Amer: 50 mL/min — ABNORMAL LOW (ref >60)
Glucose, Bld: 131 mg/dL — ABNORMAL HIGH (ref 65–99)
Potassium: 3.9 mmol/L (ref 3.5–5.1)
SODIUM: 131 mmol/L — AB (ref 135–145)
Total Bilirubin: 0.3 mg/dL (ref 0.3–1.2)
Total Protein: 6.8 g/dL (ref 6.5–8.1)

## 2017-04-14 LAB — URINALYSIS, ROUTINE W REFLEX MICROSCOPIC
Bilirubin Urine: NEGATIVE
GLUCOSE, UA: NEGATIVE mg/dL
HGB URINE DIPSTICK: NEGATIVE
KETONES UR: NEGATIVE mg/dL
Leukocytes, UA: NEGATIVE
Nitrite: NEGATIVE
PROTEIN: NEGATIVE mg/dL
Specific Gravity, Urine: 1.011 (ref 1.005–1.030)
pH: 6 (ref 5.0–8.0)

## 2017-04-14 LAB — CBC
HCT: 33.1 % — ABNORMAL LOW (ref 36.0–46.0)
Hemoglobin: 11.1 g/dL — ABNORMAL LOW (ref 12.0–15.0)
MCH: 28.4 pg (ref 26.0–34.0)
MCHC: 33.5 g/dL (ref 30.0–36.0)
MCV: 84.7 fL (ref 78.0–100.0)
PLATELETS: 521 10*3/uL — AB (ref 150–400)
RBC: 3.91 MIL/uL (ref 3.87–5.11)
RDW: 13.8 % (ref 11.5–15.5)
WBC: 14.6 10*3/uL — ABNORMAL HIGH (ref 4.0–10.5)

## 2017-04-14 LAB — LIPASE, BLOOD: LIPASE: 20 U/L (ref 11–51)

## 2017-04-14 LAB — I-STAT CG4 LACTIC ACID, ED: Lactic Acid, Venous: 1.03 mmol/L (ref 0.5–1.9)

## 2017-04-14 MED ORDER — SODIUM CHLORIDE 0.9 % IV SOLN: INTRAVENOUS | Status: DC

## 2017-04-14 MED ORDER — ACETAMINOPHEN 325 MG PO TABS
650.0000 mg | ORAL_TABLET | Freq: Once | ORAL | Status: AC
  Administered 2017-04-14: 650 mg via ORAL
  Filled 2017-04-14: qty 2

## 2017-04-14 MED ORDER — SODIUM CHLORIDE 0.9 % IV BOLUS (SEPSIS)
1000.0000 mL | Freq: Once | INTRAVENOUS | Status: AC
Start: 2017-04-14 — End: 2017-04-14
  Administered 2017-04-14: 1000 mL via INTRAVENOUS

## 2017-04-14 MED ORDER — ONDANSETRON HCL 4 MG/2ML IJ SOLN
4.0000 mg | Freq: Once | INTRAMUSCULAR | Status: AC | PRN
  Administered 2017-04-14: 4 mg via INTRAVENOUS
  Filled 2017-04-14: qty 2

## 2017-04-14 NOTE — ED Provider Notes (Signed)
[place] DEPT Provider Note   CSN: [id number] Arrival date & time: 04/14/17  1413     History   Chief Complaint Chief Complaint  Patient presents with  . Emesis    HPI Alexandra House is a 71 y.o. female.  71 year old female presents with two-week history of nausea with intermittent emesis. Denies any abdominal pain. Some cramps. No urinary symptoms. No fever or chills. Her emesis seems to get worse after she coughs. No shortness of breath but had had somewhat increased cough. Denies any chest pain or chest pressure. No diarrhea. Seen here for similar symptoms a week ago and diagnosed with dehydration and given IV fluids and felt better. Was seen by her PCP 3 days ago and diagnosed with gastritis and placed on a PPI. Symptoms are also somewhat worse at night.      Past Medical History:  Diagnosis Date  . Anxiety   . Carotid stenosis   . Fibromyalgia   . HLD (hyperlipidemia)   . Hx of cardiovascular stress test    a. Lex MV 11/13: + significant ECG changes and CHB with infusion; EF 81%, no ischemia,   . Hx of echocardiogram    a. Echo 12/12: EF 40-98%, grade 2 diastolic dysfunction, mild MR.; b. Echo 11/13:  mild LVH, EF 60-65%  . Neuropathy   . Osteoporosis   . Syncope 08/2009   a. ETT-Echo 1/11:  poor ex tol, submax exercise, no WMA or ECG changes c/w ischemia;      Patient Active Problem List   Diagnosis Date Noted  . Cerebrovascular disease 04/06/2013  . Noninfectious gastroenteritis and colitis 04/01/2013  . Bruit 01/06/2013  . Nonspecific abnormal unspecified cardiovascular function study 01/06/2013  . Chest pain 08/28/2012  . CARDIAC MURMUR 12/05/2009  . PURE HYPERCHOLESTEROLEMIA 09/12/2009  . TOBACCO ABUSE 09/12/2009  . FIBROMYALGIA 09/12/2009  . SYNCOPE 09/12/2009    History reviewed. No pertinent surgical history.  OB History    No data available       Home Medications    Prior to Admission medications   Medication Sig Start Date End Date  Taking? Authorizing Provider  albuterol (PROVENTIL HFA;VENTOLIN HFA) 108 (90 BASE) MCG/ACT inhaler Inhale 2 puffs into the lungs every 6 (six) hours as needed for wheezing or shortness of breath.     [provider]  BREO ELLIPTA 100-25 MCG/INH AEPB Inhale 1 puff into the lungs daily. 03/08/17   [provider]  budesonide-formoterol (SYMBICORT) 160-4.5 MCG/ACT inhaler Inhale 2 puffs into the lungs as needed.    [provider]  calcium carbonate (OS-CAL) 600 MG TABS Take 600 mg by mouth 2 (two) times daily with a meal.    [provider]  denosumab (PROLIA) 60 MG/ML SOLN injection Inject 60 mg into the skin every 6 (six) months. Administer in upper arm, thigh, or abdomen    [provider]  EPINEPHrine (EPIPEN JR) 0.15 MG/0.3ML injection Inject 0.15 mg into the muscle as needed for anaphylaxis.     [provider]  HYDROcodone-homatropine (HYCODAN) 5-1.5 MG/5ML syrup Take 5 mLs by mouth every 6 (six) hours as needed for cough.     [provider]  ibuprofen (ADVIL,MOTRIN) 200 MG tablet Take 200-400 mg by mouth every 6 (six) hours as needed for mild pain.     [provider]  LORazepam (ATIVAN) 1 MG tablet Take 1 mg by mouth 2 (two) times daily as needed for anxiety.     [provider]  nitroGLYCERIN (  NITROSTAT) 0.4 MG SL tablet Place 1 tablet (0.4 mg total) under the tongue every 5 (five) minutes as needed for chest pain. 08/28/12 08/28/13  Richardson Dopp T, PA-C  ondansetron (ZOFRAN) 4 MG tablet Take 1 tablet (4 mg total) by mouth every 8 (eight) hours as needed for nausea or vomiting. 04/07/17   Mu, Pilar Plate, MD  pravastatin (PRAVACHOL) 40 MG tablet Take 1 tablet (40 mg total) by mouth every evening. 04/06/13 04/07/17  Lelon Perla, MD  ranitidine (ZANTAC) 150 MG capsule Take 1 capsule (150 mg total) by mouth daily. 04/07/17   Payton Emerald, MD    Family History Family History  Problem Relation Age of Onset  . CAD Mother         Status post PCI  . Colon cancer Neg Hx     Social History Social History  Substance Use Topics  . Smoking status: Current Every Day Smoker    Packs/day: 0.50    Types: Cigarettes  . Smokeless tobacco: Current User     Comment: vapor cig.  Marland Kitchen Alcohol use Yes     Comment: glass wine once every 6 mos. per patient     Allergies   Bee venom   Review of Systems Review of Systems  All other systems reviewed and are negative.    Physical Exam Updated Vital Signs BP 103/68   Pulse (!) 104   Temp 97.8 F (36.6 C) (Oral)   Resp (!) 22   Ht 1.499 m (4\' 11" )   Wt 37.5 kg (82 lb 11.2 oz)   SpO2 97%   BMI 16.70 kg/m   Physical Exam  Constitutional: She is oriented to person, place, and time. She appears well-developed and well-nourished.  Non-toxic appearance. No distress.  HENT:  Head: Normocephalic and atraumatic.  Eyes: Conjunctivae, EOM and lids are normal. Pupils are equal, round, and reactive to light.  Neck: Normal range of motion. Neck supple. No tracheal deviation present. No thyroid mass present.  Cardiovascular: Normal rate, regular rhythm and normal heart sounds.  Exam reveals no gallop.   No murmur heard. Pulmonary/Chest: Effort normal and breath sounds normal. No stridor. No respiratory distress. She has no decreased breath sounds. She has no wheezes. She has no rhonchi. She has no rales.  Abdominal: Soft. Normal appearance and bowel sounds are normal. She exhibits no distension. There is no tenderness. There is no rebound and no CVA tenderness.  Musculoskeletal: Normal range of motion. She exhibits no edema or tenderness.  Neurological: She is alert and oriented to person, place, and time. She has normal strength. No cranial nerve deficit or sensory deficit. GCS eye subscore is 4. GCS verbal subscore is 5. GCS motor subscore is 6.  Skin: Skin is warm and dry. No abrasion and no rash noted.  Psychiatric: She has a normal mood and affect. Her speech is normal  and behavior is normal.  Nursing note and vitals reviewed.    ED Treatments / Results  Labs (all labs ordered are listed, but only abnormal results are displayed) Labs Reviewed  COMPREHENSIVE METABOLIC PANEL - Abnormal; Notable for the following:       Result Value   Sodium 131 (*)    CO2 21 (*)    Glucose, Bld 131 (*)    Creatinine, Ser 1.08 (*)    Calcium 8.1 (*)    Albumin 2.5 (*)    AST 46 (*)    Alkaline Phosphatase 148 (*)    GFR calc non Af  Amer 50 (*)    GFR calc Af Amer 58 (*)    All other components within normal limits  CBC - Abnormal; Notable for the following:    WBC 14.6 (*)    Hemoglobin 11.1 (*)    HCT 33.1 (*)    Platelets 521 (*)    All other components within normal limits  LIPASE, BLOOD  URINALYSIS, ROUTINE W REFLEX MICROSCOPIC  I-STAT CG4 LACTIC ACID, ED    EKG  EKG Interpretation None       Radiology No results found.  Procedures Procedures (including critical care time)  Medications Ordered in ED Medications  sodium chloride 0.9 % bolus 1,000 mL (not administered)  0.9 %  sodium chloride infusion (not administered)     Initial Impression / Assessment and Plan / ED Course  I have reviewed the triage vital signs and the nursing notes.  Pertinent labs & imaging results that were available during my care of the patient were reviewed by me and considered in my medical decision making (see chart for details).    Patient has no signs of dehydration here. Suspect that she needs to have an evaluation by gastroenterologist. She sees Dr. Benson Norway and will follow-up with him  Final Clinical Impressions(s) / ED Diagnoses   Final diagnoses:  SOB (shortness of breath)    New Prescriptions New Prescriptions   No medications on file     Lacretia Leigh, MD 04/14/17 [phone]

## 2017-04-14 NOTE — ED Triage Notes (Signed)
Pt was seen here last week for syncope and dehydration. Family reports that despite taking zofran, pt still having n/v. Denies diarrhea. Has back pain but denies urinary symptoms.

## 2017-04-14 NOTE — ED Notes (Signed)
Patient transported to x-ray. ?

## 2017-04-14 NOTE — ED Notes (Signed)
Dr. Zenia Resides notified that patient is requesting Tylenol. Gave verbal order for 650mg  Tylenol - see orders.

## 2017-04-14 NOTE — ED Notes (Signed)
Patient verbalized understanding of discharge instructions and denies any further needs or questions at this time. VS stable. Patient ambulatory with steady gait. Escorted to ED entrance in wheelchair.   

## 2017-04-14 NOTE — Discharge Instructions (Signed)
You need to be seen by a gastroenterologist. Follow-up with Dr. Benson Norway

## 2017-04-15 ENCOUNTER — Other Ambulatory Visit: Payer: Self-pay | Admitting: Gastroenterology

## 2017-04-15 DIAGNOSIS — R112 Nausea with vomiting, unspecified: Secondary | ICD-10-CM

## 2017-04-15 DIAGNOSIS — R634 Abnormal weight loss: Secondary | ICD-10-CM | POA: Diagnosis not present

## 2017-04-15 DIAGNOSIS — R740 Nonspecific elevation of levels of transaminase and lactic acid dehydrogenase [LDH]: Secondary | ICD-10-CM | POA: Diagnosis not present

## 2017-04-16 DIAGNOSIS — R634 Abnormal weight loss: Secondary | ICD-10-CM | POA: Diagnosis not present

## 2017-04-16 DIAGNOSIS — R112 Nausea with vomiting, unspecified: Secondary | ICD-10-CM | POA: Diagnosis not present

## 2017-04-16 DIAGNOSIS — K449 Diaphragmatic hernia without obstruction or gangrene: Secondary | ICD-10-CM | POA: Diagnosis not present

## 2017-04-18 ENCOUNTER — Ambulatory Visit
Admission: RE | Admit: 2017-04-18 | Discharge: 2017-04-18 | Disposition: A | Discharge status: Home / Self Care | Payer: Medicare HMO | Source: Ambulatory Visit | Attending: Gastroenterology | Admitting: Gastroenterology

## 2017-04-18 DIAGNOSIS — R112 Nausea with vomiting, unspecified: Secondary | ICD-10-CM

## 2017-04-20 ENCOUNTER — Encounter (HOSPITAL_COMMUNITY): Payer: Self-pay | Admitting: Emergency Medicine

## 2017-04-20 ENCOUNTER — Inpatient Hospital Stay (HOSPITAL_COMMUNITY)
Admission: EM | Admit: 2017-04-20 | Discharge: 2017-05-09 | DRG: 871 | Disposition: A | Discharge status: Skilled Nursing Facility | Payer: Medicare HMO | Attending: Internal Medicine | Admitting: Internal Medicine

## 2017-04-20 ENCOUNTER — Emergency Department (HOSPITAL_COMMUNITY): Payer: Medicare HMO

## 2017-04-20 ENCOUNTER — Inpatient Hospital Stay (HOSPITAL_COMMUNITY): Payer: Medicare HMO

## 2017-04-20 DIAGNOSIS — E872 Acidosis: Secondary | ICD-10-CM | POA: Diagnosis present

## 2017-04-20 DIAGNOSIS — I959 Hypotension, unspecified: Secondary | ICD-10-CM | POA: Diagnosis not present

## 2017-04-20 DIAGNOSIS — G9340 Encephalopathy, unspecified: Secondary | ICD-10-CM

## 2017-04-20 DIAGNOSIS — R1312 Dysphagia, oropharyngeal phase: Secondary | ICD-10-CM | POA: Diagnosis not present

## 2017-04-20 DIAGNOSIS — Z931 Gastrostomy status: Secondary | ICD-10-CM

## 2017-04-20 DIAGNOSIS — T68XXXA Hypothermia, initial encounter: Secondary | ICD-10-CM | POA: Diagnosis not present

## 2017-04-20 DIAGNOSIS — E440 Moderate protein-calorie malnutrition: Secondary | ICD-10-CM | POA: Diagnosis present

## 2017-04-20 DIAGNOSIS — R7989 Other specified abnormal findings of blood chemistry: Secondary | ICD-10-CM | POA: Diagnosis present

## 2017-04-20 DIAGNOSIS — M810 Age-related osteoporosis without current pathological fracture: Secondary | ICD-10-CM | POA: Diagnosis present

## 2017-04-20 DIAGNOSIS — K7200 Acute and subacute hepatic failure without coma: Secondary | ICD-10-CM | POA: Diagnosis present

## 2017-04-20 DIAGNOSIS — E876 Hypokalemia: Secondary | ICD-10-CM

## 2017-04-20 DIAGNOSIS — I5032 Chronic diastolic (congestive) heart failure: Secondary | ICD-10-CM | POA: Diagnosis present

## 2017-04-20 DIAGNOSIS — J9601 Acute respiratory failure with hypoxia: Secondary | ICD-10-CM | POA: Diagnosis not present

## 2017-04-20 DIAGNOSIS — R748 Abnormal levels of other serum enzymes: Secondary | ICD-10-CM

## 2017-04-20 DIAGNOSIS — J9690 Respiratory failure, unspecified, unspecified whether with hypoxia or hypercapnia: Secondary | ICD-10-CM | POA: Diagnosis not present

## 2017-04-20 DIAGNOSIS — R570 Cardiogenic shock: Secondary | ICD-10-CM | POA: Diagnosis not present

## 2017-04-20 DIAGNOSIS — R9431 Abnormal electrocardiogram [ECG] [EKG]: Secondary | ICD-10-CM | POA: Diagnosis not present

## 2017-04-20 DIAGNOSIS — R6520 Severe sepsis without septic shock: Secondary | ICD-10-CM | POA: Diagnosis not present

## 2017-04-20 DIAGNOSIS — I441 Atrioventricular block, second degree: Secondary | ICD-10-CM | POA: Diagnosis present

## 2017-04-20 DIAGNOSIS — R0602 Shortness of breath: Secondary | ICD-10-CM | POA: Diagnosis not present

## 2017-04-20 DIAGNOSIS — E875 Hyperkalemia: Secondary | ICD-10-CM | POA: Diagnosis present

## 2017-04-20 DIAGNOSIS — R531 Weakness: Secondary | ICD-10-CM | POA: Diagnosis not present

## 2017-04-20 DIAGNOSIS — I214 Non-ST elevation (NSTEMI) myocardial infarction: Secondary | ICD-10-CM

## 2017-04-20 DIAGNOSIS — K529 Noninfective gastroenteritis and colitis, unspecified: Secondary | ICD-10-CM | POA: Diagnosis not present

## 2017-04-20 DIAGNOSIS — E785 Hyperlipidemia, unspecified: Secondary | ICD-10-CM | POA: Diagnosis present

## 2017-04-20 DIAGNOSIS — I6523 Occlusion and stenosis of bilateral carotid arteries: Secondary | ICD-10-CM | POA: Diagnosis present

## 2017-04-20 DIAGNOSIS — J440 Chronic obstructive pulmonary disease with acute lower respiratory infection: Secondary | ICD-10-CM | POA: Diagnosis present

## 2017-04-20 DIAGNOSIS — R0902 Hypoxemia: Secondary | ICD-10-CM | POA: Diagnosis not present

## 2017-04-20 DIAGNOSIS — E860 Dehydration: Secondary | ICD-10-CM | POA: Diagnosis present

## 2017-04-20 DIAGNOSIS — A419 Sepsis, unspecified organism: Principal | ICD-10-CM | POA: Diagnosis present

## 2017-04-20 DIAGNOSIS — J449 Chronic obstructive pulmonary disease, unspecified: Secondary | ICD-10-CM | POA: Diagnosis present

## 2017-04-20 DIAGNOSIS — I248 Other forms of acute ischemic heart disease: Secondary | ICD-10-CM | POA: Diagnosis not present

## 2017-04-20 DIAGNOSIS — F17210 Nicotine dependence, cigarettes, uncomplicated: Secondary | ICD-10-CM | POA: Diagnosis present

## 2017-04-20 DIAGNOSIS — I272 Pulmonary hypertension, unspecified: Secondary | ICD-10-CM | POA: Diagnosis not present

## 2017-04-20 DIAGNOSIS — I360 Nonrheumatic tricuspid (valve) stenosis: Secondary | ICD-10-CM | POA: Diagnosis not present

## 2017-04-20 DIAGNOSIS — R001 Bradycardia, unspecified: Secondary | ICD-10-CM | POA: Diagnosis not present

## 2017-04-20 DIAGNOSIS — K760 Fatty (change of) liver, not elsewhere classified: Secondary | ICD-10-CM | POA: Diagnosis not present

## 2017-04-20 DIAGNOSIS — R680 Hypothermia, not associated with low environmental temperature: Secondary | ICD-10-CM | POA: Diagnosis present

## 2017-04-20 DIAGNOSIS — Z4659 Encounter for fitting and adjustment of other gastrointestinal appliance and device: Secondary | ICD-10-CM

## 2017-04-20 DIAGNOSIS — R112 Nausea with vomiting, unspecified: Secondary | ICD-10-CM

## 2017-04-20 DIAGNOSIS — Z681 Body mass index (BMI) 19 or less, adult: Secondary | ICD-10-CM

## 2017-04-20 DIAGNOSIS — N179 Acute kidney failure, unspecified: Secondary | ICD-10-CM | POA: Diagnosis present

## 2017-04-20 DIAGNOSIS — J432 Centrilobular emphysema: Secondary | ICD-10-CM | POA: Diagnosis present

## 2017-04-20 DIAGNOSIS — R4182 Altered mental status, unspecified: Secondary | ICD-10-CM | POA: Diagnosis not present

## 2017-04-20 DIAGNOSIS — I5023 Acute on chronic systolic (congestive) heart failure: Secondary | ICD-10-CM | POA: Diagnosis present

## 2017-04-20 DIAGNOSIS — J9600 Acute respiratory failure, unspecified whether with hypoxia or hypercapnia: Secondary | ICD-10-CM

## 2017-04-20 DIAGNOSIS — Z01818 Encounter for other preprocedural examination: Secondary | ICD-10-CM

## 2017-04-20 DIAGNOSIS — G9341 Metabolic encephalopathy: Secondary | ICD-10-CM | POA: Diagnosis present

## 2017-04-20 DIAGNOSIS — R6521 Severe sepsis with septic shock: Secondary | ICD-10-CM | POA: Diagnosis not present

## 2017-04-20 DIAGNOSIS — Z4682 Encounter for fitting and adjustment of non-vascular catheter: Secondary | ICD-10-CM | POA: Diagnosis not present

## 2017-04-20 DIAGNOSIS — I509 Heart failure, unspecified: Secondary | ICD-10-CM | POA: Diagnosis not present

## 2017-04-20 DIAGNOSIS — R918 Other nonspecific abnormal finding of lung field: Secondary | ICD-10-CM | POA: Diagnosis not present

## 2017-04-20 DIAGNOSIS — E162 Hypoglycemia, unspecified: Secondary | ICD-10-CM | POA: Diagnosis present

## 2017-04-20 DIAGNOSIS — Z720 Tobacco use: Secondary | ICD-10-CM | POA: Diagnosis not present

## 2017-04-20 DIAGNOSIS — R188 Other ascites: Secondary | ICD-10-CM | POA: Diagnosis not present

## 2017-04-20 DIAGNOSIS — R2681 Unsteadiness on feet: Secondary | ICD-10-CM | POA: Diagnosis not present

## 2017-04-20 DIAGNOSIS — R5381 Other malaise: Secondary | ICD-10-CM | POA: Diagnosis not present

## 2017-04-20 DIAGNOSIS — M797 Fibromyalgia: Secondary | ICD-10-CM | POA: Diagnosis present

## 2017-04-20 DIAGNOSIS — E871 Hypo-osmolality and hyponatremia: Secondary | ICD-10-CM | POA: Diagnosis present

## 2017-04-20 DIAGNOSIS — R0682 Tachypnea, not elsewhere classified: Secondary | ICD-10-CM | POA: Diagnosis present

## 2017-04-20 DIAGNOSIS — D7289 Other specified disorders of white blood cells: Secondary | ICD-10-CM | POA: Diagnosis not present

## 2017-04-20 DIAGNOSIS — F419 Anxiety disorder, unspecified: Secondary | ICD-10-CM | POA: Diagnosis present

## 2017-04-20 DIAGNOSIS — R740 Nonspecific elevation of levels of transaminase and lactic acid dehydrogenase [LDH]: Secondary | ICD-10-CM | POA: Diagnosis present

## 2017-04-20 DIAGNOSIS — J189 Pneumonia, unspecified organism: Secondary | ICD-10-CM | POA: Diagnosis not present

## 2017-04-20 DIAGNOSIS — Z79899 Other long term (current) drug therapy: Secondary | ICD-10-CM

## 2017-04-20 DIAGNOSIS — Z978 Presence of other specified devices: Secondary | ICD-10-CM

## 2017-04-20 DIAGNOSIS — R579 Shock, unspecified: Secondary | ICD-10-CM | POA: Diagnosis not present

## 2017-04-20 DIAGNOSIS — Z66 Do not resuscitate: Secondary | ICD-10-CM | POA: Diagnosis present

## 2017-04-20 DIAGNOSIS — J90 Pleural effusion, not elsewhere classified: Secondary | ICD-10-CM | POA: Diagnosis not present

## 2017-04-20 DIAGNOSIS — R41841 Cognitive communication deficit: Secondary | ICD-10-CM | POA: Diagnosis not present

## 2017-04-20 DIAGNOSIS — J690 Pneumonitis due to inhalation of food and vomit: Secondary | ICD-10-CM

## 2017-04-20 DIAGNOSIS — R601 Generalized edema: Secondary | ICD-10-CM | POA: Diagnosis not present

## 2017-04-20 DIAGNOSIS — I5042 Chronic combined systolic (congestive) and diastolic (congestive) heart failure: Secondary | ICD-10-CM | POA: Diagnosis not present

## 2017-04-20 DIAGNOSIS — R739 Hyperglycemia, unspecified: Secondary | ICD-10-CM | POA: Diagnosis present

## 2017-04-20 DIAGNOSIS — Z716 Tobacco abuse counseling: Secondary | ICD-10-CM

## 2017-04-20 DIAGNOSIS — E039 Hypothyroidism, unspecified: Secondary | ICD-10-CM | POA: Diagnosis present

## 2017-04-20 DIAGNOSIS — I9589 Other hypotension: Secondary | ICD-10-CM | POA: Diagnosis not present

## 2017-04-20 DIAGNOSIS — Z8249 Family history of ischemic heart disease and other diseases of the circulatory system: Secondary | ICD-10-CM

## 2017-04-20 DIAGNOSIS — R778 Other specified abnormalities of plasma proteins: Secondary | ICD-10-CM | POA: Diagnosis present

## 2017-04-20 DIAGNOSIS — Z0189 Encounter for other specified special examinations: Secondary | ICD-10-CM

## 2017-04-20 DIAGNOSIS — Z801 Family history of malignant neoplasm of trachea, bronchus and lung: Secondary | ICD-10-CM

## 2017-04-20 DIAGNOSIS — J80 Acute respiratory distress syndrome: Secondary | ICD-10-CM | POA: Diagnosis not present

## 2017-04-20 DIAGNOSIS — M6281 Muscle weakness (generalized): Secondary | ICD-10-CM | POA: Diagnosis not present

## 2017-04-20 LAB — TYPE AND SCREEN
ABO/RH(D): AB POS
Antibody Screen: NEGATIVE

## 2017-04-20 LAB — COMPREHENSIVE METABOLIC PANEL
ALK PHOS: 238 U/L — AB (ref 38–126)
ALT: 48 U/L (ref 14–54)
AST: 113 U/L — AB (ref 15–41)
Albumin: 2.5 g/dL — ABNORMAL LOW (ref 3.5–5.0)
Anion gap: 16 — ABNORMAL HIGH (ref 5–15)
BILIRUBIN TOTAL: 0.4 mg/dL (ref 0.3–1.2)
BUN: 35 mg/dL — AB (ref 6–20)
CALCIUM: 8.1 mg/dL — AB (ref 8.9–10.3)
CHLORIDE: 101 mmol/L (ref 101–111)
CO2: 18 mmol/L — ABNORMAL LOW (ref 22–32)
CREATININE: 3.09 mg/dL — AB (ref 0.44–1.00)
GFR, EST AFRICAN AMERICAN: 16 mL/min — AB (ref >60)
GFR, EST NON AFRICAN AMERICAN: 14 mL/min — AB (ref >60)
Glucose, Bld: 140 mg/dL — ABNORMAL HIGH (ref 65–99)
Potassium: 4.9 mmol/L (ref 3.5–5.1)
Sodium: 135 mmol/L (ref 135–145)
TOTAL PROTEIN: 7 g/dL (ref 6.5–8.1)

## 2017-04-20 LAB — I-STAT VENOUS BLOOD GAS, ED
ACID-BASE DEFICIT: 11 mmol/L — AB (ref 0.0–2.0)
Acid-base deficit: 8 mmol/L — ABNORMAL HIGH (ref 0.0–2.0)
BICARBONATE: 15.5 mmol/L — AB (ref 20.0–28.0)
Bicarbonate: 19.3 mmol/L — ABNORMAL LOW (ref 20.0–28.0)
O2 Saturation: 41 %
O2 Saturation: 52 %
PCO2 VEN: 46.6 mmHg (ref 44.0–60.0)
PH VEN: 7.252 (ref 7.250–7.430)
PO2 VEN: 32 mmHg (ref 32.0–45.0)
TCO2: 21 mmol/L (ref 0–100)
TCO2: 17 mmol/L (ref 0–100)
pCO2, Ven: 35.1 mmHg — ABNORMAL LOW (ref 44.0–60.0)
pH, Ven: 7.224 — ABNORMAL LOW (ref 7.250–7.430)
pO2, Ven: 28 mmHg — CL (ref 32.0–45.0)

## 2017-04-20 LAB — URINALYSIS, ROUTINE W REFLEX MICROSCOPIC
BACTERIA UA: NONE SEEN
Bilirubin Urine: NEGATIVE
Glucose, UA: NEGATIVE mg/dL
Hgb urine dipstick: NEGATIVE
KETONES UR: NEGATIVE mg/dL
LEUKOCYTES UA: NEGATIVE
Nitrite: NEGATIVE
PH: 5 (ref 5.0–8.0)
PROTEIN: 30 mg/dL — AB
RBC / HPF: NONE SEEN RBC/hpf (ref 0–5)
Specific Gravity, Urine: 1.021 (ref 1.005–1.030)

## 2017-04-20 LAB — CBC WITH DIFFERENTIAL/PLATELET
BASOS ABS: 0 10*3/uL (ref 0.0–0.1)
BASOS PCT: 0 %
EOS ABS: 0 10*3/uL (ref 0.0–0.7)
Eosinophils Relative: 0 %
HCT: 34 % — ABNORMAL LOW (ref 36.0–46.0)
HEMOGLOBIN: 10.9 g/dL — AB (ref 12.0–15.0)
LYMPHS ABS: 5 10*3/uL — AB (ref 0.7–4.0)
LYMPHS PCT: 18 %
MCH: 27.5 pg (ref 26.0–34.0)
MCHC: 32.1 g/dL (ref 30.0–36.0)
MCV: 85.9 fL (ref 78.0–100.0)
Monocytes Absolute: 1.1 10*3/uL — ABNORMAL HIGH (ref 0.1–1.0)
Monocytes Relative: 4 %
NEUTROS ABS: 21.6 10*3/uL — AB (ref 1.7–7.7)
Neutrophils Relative %: 78 %
Platelets: 709 10*3/uL — ABNORMAL HIGH (ref 150–400)
RBC: 3.96 MIL/uL (ref 3.87–5.11)
RDW: 14 % (ref 11.5–15.5)
WBC: 27.7 10*3/uL — ABNORMAL HIGH (ref 4.0–10.5)

## 2017-04-20 LAB — I-STAT CHEM 8, ED
BUN: 36 mg/dL — ABNORMAL HIGH (ref 6–20)
CALCIUM ION: 1.01 mmol/L — AB (ref 1.15–1.40)
CHLORIDE: 102 mmol/L (ref 101–111)
Creatinine, Ser: 2.8 mg/dL — ABNORMAL HIGH (ref 0.44–1.00)
Glucose, Bld: 132 mg/dL — ABNORMAL HIGH (ref 65–99)
HCT: 40 % (ref 36.0–46.0)
HEMOGLOBIN: 13.6 g/dL (ref 12.0–15.0)
POTASSIUM: 4.8 mmol/L (ref 3.5–5.1)
SODIUM: 137 mmol/L (ref 135–145)
TCO2: 21 mmol/L (ref 0–100)

## 2017-04-20 LAB — I-STAT CG4 LACTIC ACID, ED
LACTIC ACID, VENOUS: 6.39 mmol/L — AB (ref 0.5–1.9)
Lactic Acid, Venous: 3.8 mmol/L (ref 0.5–1.9)

## 2017-04-20 LAB — POC OCCULT BLOOD, ED: FECAL OCCULT BLD: NEGATIVE

## 2017-04-20 LAB — MAGNESIUM
MAGNESIUM: 1.8 mg/dL (ref 1.7–2.4)
Magnesium: 1.9 mg/dL (ref 1.7–2.4)

## 2017-04-20 LAB — CORTISOL: CORTISOL PLASMA: 29.9 ug/dL

## 2017-04-20 LAB — TROPONIN I
Troponin I: 2.02 ng/mL (ref <0.03)
Troponin I: 2.08 ng/mL (ref <0.03)

## 2017-04-20 LAB — HEPARIN LEVEL (UNFRACTIONATED)

## 2017-04-20 LAB — MRSA PCR SCREENING: MRSA BY PCR: NEGATIVE

## 2017-04-20 LAB — PROTIME-INR
INR: 1.1
PROTHROMBIN TIME: 14.2 s (ref 11.4–15.2)

## 2017-04-20 LAB — PHOSPHORUS
Phosphorus: 5.2 mg/dL — ABNORMAL HIGH (ref 2.5–4.6)
Phosphorus: 4.7 mg/dL — ABNORMAL HIGH (ref 2.5–4.6)

## 2017-04-20 LAB — I-STAT TROPONIN, ED: TROPONIN I, POC: 2.05 ng/mL — AB (ref 0.00–0.08)

## 2017-04-20 LAB — LIPASE, BLOOD: LIPASE: 19 U/L (ref 11–51)

## 2017-04-20 LAB — TSH: TSH: 4.801 u[IU]/mL — ABNORMAL HIGH (ref 0.350–4.500)

## 2017-04-20 LAB — PROCALCITONIN: Procalcitonin: 1.07 ng/mL

## 2017-04-20 LAB — ABO/RH: ABO/RH(D): AB POS

## 2017-04-20 MED ORDER — SODIUM CHLORIDE 0.9 % IV BOLUS (SEPSIS)
250.0000 mL | Freq: Once | INTRAVENOUS | Status: AC
  Administered 2017-04-20: 250 mL via INTRAVENOUS

## 2017-04-20 MED ORDER — SODIUM CHLORIDE 0.9 % IV BOLUS (SEPSIS)
1000.0000 mL | Freq: Once | INTRAVENOUS | Status: AC
  Administered 2017-04-20: 1000 mL via INTRAVENOUS

## 2017-04-20 MED ORDER — NOREPINEPHRINE BITARTRATE 1 MG/ML IV SOLN
0.0000 ug/min | INTRAVENOUS | Status: DC
  Filled 2017-04-20: qty 4

## 2017-04-20 MED ORDER — SODIUM BICARBONATE 8.4 % IV SOLN
INTRAVENOUS | Status: DC
  Administered 2017-04-20: 18:00:00 via INTRAVENOUS
  Filled 2017-04-20 (×3): qty 150

## 2017-04-20 MED ORDER — ACETAMINOPHEN 325 MG PO TABS
650.0000 mg | ORAL_TABLET | Freq: Four times a day (QID) | ORAL | Status: DC | PRN
  Administered 2017-04-21: 650 mg via ORAL
  Filled 2017-04-20: qty 2

## 2017-04-20 MED ORDER — HEPARIN (PORCINE) IN NACL 100-0.45 UNIT/ML-% IJ SOLN
300.0000 [IU]/h | INTRAMUSCULAR | Status: DC
  Administered 2017-04-20: 300 [IU]/h via INTRAVENOUS
  Filled 2017-04-20: qty 250

## 2017-04-20 MED ORDER — ONDANSETRON HCL 4 MG/2ML IJ SOLN
4.0000 mg | Freq: Four times a day (QID) | INTRAMUSCULAR | Status: DC | PRN
  Administered 2017-04-20 – 2017-04-28 (×2): 4 mg via INTRAVENOUS
  Filled 2017-04-20 (×2): qty 2

## 2017-04-20 MED ORDER — ONDANSETRON HCL 4 MG/2ML IJ SOLN
INTRAMUSCULAR | Status: AC
  Filled 2017-04-20: qty 2

## 2017-04-20 MED ORDER — SODIUM CHLORIDE 0.9% FLUSH
3.0000 mL | Freq: Two times a day (BID) | INTRAVENOUS | Status: DC
  Administered 2017-04-20 – 2017-04-23 (×7): 3 mL via INTRAVENOUS

## 2017-04-20 MED ORDER — SODIUM CHLORIDE 0.9 % IV SOLN
INTRAVENOUS | Status: DC
  Administered 2017-04-20: 150 mL/h via INTRAVENOUS
  Administered 2017-04-20: 20:00:00 via INTRAVENOUS
  Administered 2017-04-21: 125 mL/h via INTRAVENOUS
  Administered 2017-04-22 – 2017-04-23 (×4): via INTRAVENOUS
  Administered 2017-04-23: 1000 mL via INTRAVENOUS
  Administered 2017-04-26 – 2017-04-30 (×3): via INTRAVENOUS

## 2017-04-20 MED ORDER — ACETAMINOPHEN 650 MG RE SUPP
650.0000 mg | Freq: Four times a day (QID) | RECTAL | Status: DC | PRN
  Administered 2017-04-21: 650 mg via RECTAL
  Filled 2017-04-20: qty 1

## 2017-04-20 MED ORDER — LACTATED RINGERS IV BOLUS (SEPSIS)
1000.0000 mL | Freq: Once | INTRAVENOUS | Status: DC
  Administered 2017-04-20: 1000 mL via INTRAVENOUS

## 2017-04-20 MED ORDER — GUAIFENESIN ER 600 MG PO TB12
600.0000 mg | ORAL_TABLET | Freq: Two times a day (BID) | ORAL | Status: DC
  Administered 2017-04-20 – 2017-05-02 (×5): 600 mg via ORAL
  Filled 2017-04-20 (×25): qty 1

## 2017-04-20 MED ORDER — SODIUM CHLORIDE 0.9 % IV BOLUS (SEPSIS)
500.0000 mL | Freq: Once | INTRAVENOUS | Status: AC
  Administered 2017-04-20: 500 mL via INTRAVENOUS

## 2017-04-20 MED ORDER — ATROPINE SULFATE 1 MG/ML IJ SOLN
0.4000 mg | Freq: Once | INTRAMUSCULAR | Status: AC
  Administered 2017-04-20: 0.4 mg via INTRAVENOUS
  Filled 2017-04-20: qty 1

## 2017-04-20 MED ORDER — VANCOMYCIN HCL IN DEXTROSE 1-5 GM/200ML-% IV SOLN
1000.0000 mg | Freq: Once | INTRAVENOUS | Status: AC
  Administered 2017-04-20: 1000 mg via INTRAVENOUS
  Filled 2017-04-20: qty 200

## 2017-04-20 MED ORDER — ONDANSETRON HCL 4 MG PO TABS: 4.0000 mg | ORAL_TABLET | Freq: Four times a day (QID) | ORAL | Status: DC | PRN

## 2017-04-20 MED ORDER — PIPERACILLIN-TAZOBACTAM IN DEX 2-0.25 GM/50ML IV SOLN
2.2500 g | Freq: Three times a day (TID) | INTRAVENOUS | Status: DC
  Administered 2017-04-20 – 2017-04-24 (×11): 2.25 g via INTRAVENOUS
  Filled 2017-04-20 (×14): qty 50

## 2017-04-20 MED ORDER — SODIUM CHLORIDE 0.9 % IV BOLUS (SEPSIS)
1500.0000 mL | Freq: Once | INTRAVENOUS | Status: AC
  Administered 2017-04-20: 1500 mL via INTRAVENOUS

## 2017-04-20 MED ORDER — PIPERACILLIN-TAZOBACTAM 3.375 G IVPB 30 MIN
3.3750 g | Freq: Once | INTRAVENOUS | Status: AC
  Administered 2017-04-20: 3.375 g via INTRAVENOUS
  Filled 2017-04-20: qty 50

## 2017-04-20 MED ORDER — NOREPINEPHRINE BITARTRATE 1 MG/ML IV SOLN: 0.0000 ug/min | INTRAVENOUS | Status: DC

## 2017-04-20 MED ORDER — HEPARIN (PORCINE) IN NACL 100-0.45 UNIT/ML-% IJ SOLN
700.0000 [IU]/h | INTRAMUSCULAR | Status: DC
  Administered 2017-04-21: 600 [IU]/h via INTRAVENOUS
  Administered 2017-04-23 – 2017-04-24 (×2): 700 [IU]/h via INTRAVENOUS
  Filled 2017-04-20 (×6): qty 250

## 2017-04-20 MED ORDER — ONDANSETRON HCL 4 MG/2ML IJ SOLN
4.0000 mg | Freq: Once | INTRAMUSCULAR | Status: AC
  Administered 2017-04-20: 4 mg via INTRAVENOUS

## 2017-04-20 MED ORDER — MOMETASONE FURO-FORMOTEROL FUM 200-5 MCG/ACT IN AERO
2.0000 | INHALATION_SPRAY | Freq: Two times a day (BID) | RESPIRATORY_TRACT | Status: DC
  Administered 2017-04-21: 2 via RESPIRATORY_TRACT
  Filled 2017-04-20: qty 8.8

## 2017-04-20 MED ORDER — FLUTICASONE FUROATE-VILANTEROL 100-25 MCG/INH IN AEPB
1.0000 | INHALATION_SPRAY | Freq: Every day | RESPIRATORY_TRACT | Status: DC
  Administered 2017-04-21: 09:00:00 1 via RESPIRATORY_TRACT
  Filled 2017-04-20: qty 28

## 2017-04-20 MED ORDER — ALBUTEROL SULFATE (2.5 MG/3ML) 0.083% IN NEBU: 3.0000 mL | INHALATION_SOLUTION | Freq: Four times a day (QID) | RESPIRATORY_TRACT | Status: DC | PRN

## 2017-04-20 MED ORDER — ASPIRIN 81 MG PO CHEW
324.0000 mg | CHEWABLE_TABLET | Freq: Once | ORAL | Status: AC
  Administered 2017-04-20: 324 mg via ORAL
  Filled 2017-04-20: qty 4

## 2017-04-20 NOTE — Progress Notes (Signed)
Pharmacy Antibiotic Note  Alexandra House is a 71 y.o. female admitted on 04/20/2017 with sepsis.  Pharmacy has been consulted for Vancomycin and Zosyn dosing.  Vancomycin 1,000mg  x 1 dose given in ED. Zosyn 3.75g x 1 dose given in ED.   Height: 4\' 11"  (149.9 cm) Weight: 82 lb (37.2 kg) IBW/kg (Calculated) : 43.2   Recent Labs Lab 04/14/17 1433 04/14/17 1449 04/20/17 1050 04/20/17 1119 04/20/17 1156  WBC 14.6*  --  27.7*  --   --   CREATININE 1.08*  --  3.09*  --  2.80*  LATICACIDVEN  --  1.03  --  6.39*  --       Allergies  Allergen Reactions  . Bee Venom Anaphylaxis and Hives   Assessment:  71 yo F presenting w/ sepsis. WBC 27.7 HR 35 BP 71/35 T 96 Hgb 10.9 PLT 709 Lactic acid 6.39; BCx x1, not 2 Cx b/c not enough blood  Estimated Creatinine Clearance: 10.5 mL/min (A) (by C-G formula based on SCr of 2.8 mg/dL (H)).  Plan: Hold off on Vancomycin maintenance dose until vancomycin level comes back on 7/9.  zosyn 2.25g IV q8h for poor renal function.  Antimicrobials this admission: Vancomycin 04/20/17 >>  Zosyn 04/20/17 >>   Dose adjustments this admission: Vanc: hold maintenance dose  Zosyn 2.25g q8h  Microbiology results: F/u BCx:  F/u UCx:   Thank you for allowing pharmacy to be a part of this patient's care.  Nida Boatman, PharmD PGY1 Acute Care Pharmacy Resident Pager: [phone] 04/20/2017 12:39 PM   I discussed / reviewed the pharmacy note by Dr. Haydee Salter and I agree with the resident's findings and plans as documented.   Bonnita Nasuti Pharm.D. CPP, BCPS Clinical Pharmacist [phone] 04/20/2017 1:36 PM

## 2017-04-20 NOTE — ED Triage Notes (Signed)
Pt returns for continues to have weakness, N/V that has been ongoing for more than 4 weeks. Pt has been seen here for same and family is requesting that she be admitted.

## 2017-04-20 NOTE — Progress Notes (Signed)
ANTICOAGULATION CONSULT NOTE - Initial Consult  Pharmacy Consult for heparin  Indication: chest pain/ACS  Allergies  Allergen Reactions  . Bee Venom Anaphylaxis and Hives    Patient Measurements: Height: 4\' 11"  (149.9 cm) Weight: 82 lb (37.2 kg) IBW/kg (Calculated) : 43.2   Vital Signs: Temp: 96 F (35.6 C) (07/07 1245) Temp Source: Rectal (07/07 1245) BP: 102/67 (07/07 1330) Pulse Rate: 44 (07/07 1330)  Labs:  Recent Labs  04/20/17 1050 04/20/17 1156  HGB 10.9* 13.6  HCT 34.0* 40.0  PLT 709*  --   LABPROT 14.2  --   INR 1.10  --   CREATININE 3.09* 2.80*    Estimated Creatinine Clearance: 10.5 mL/min (A) (by C-G formula based on SCr of 2.8 mg/dL (H)).   Medical History: Past Medical History:  Diagnosis Date  . Anxiety   . Carotid stenosis   . Fibromyalgia   . HLD (hyperlipidemia)   . Hx of cardiovascular stress test    a. Lex MV 11/13: + significant ECG changes and CHB with infusion; EF 81%, no ischemia,   . Hx of echocardiogram    a. Echo 12/12: EF 65-79%, grade 2 diastolic dysfunction, mild MR.; b. Echo 11/13:  mild LVH, EF 60-65%  . Neuropathy   . Osteoporosis   . Syncope 08/2009   a. ETT-Echo 1/11:  poor ex tol, submax exercise, no WMA or ECG changes c/w ischemia;       Assessment: 71yof admitted with ARF, N/V dehydration, possible sepsis and bump in troponin 2.  Heparin ordered for possible ACS. H/H low 11/34 pltc700, no bleeding noted.  Goal of Therapy:  Heparin level 0.3-0.7 units/ml Monitor platelets by anticoagulation protocol: Yes   Plan:  No bolus Heparin drip 300 uts/hr  Draw LH 6hr after start Daily HL, CBC  Bonnita Nasuti Pharm.D. CPP, BCPS Clinical Pharmacist [phone] 04/20/2017 1:39 PM

## 2017-04-20 NOTE — Consult Note (Signed)
CARDIOLOGY CONSULT NOTE  Patient ID: Alexandra House MRN: [id number] DOB/AGE: [birth year] 73 y.o.  Admit date: 04/20/2017 Primary Physician Marton Redwood, MD Primary Cardiologist Dr. Stanford Breed Chief Complaint  Weakness  Requesting   Dr. Tomi Bamberger.   HPI:  The patient presented with nausea and vomiting.  She has had an continued problem with this requiring ED visits and leading to EGD and ultrasound.   She has had weight loss and loss of functional level.  Today she presented to the ED with weakness.  This has been progressive but she was even more so this AM.  Her husband says that she was not really able to get out of bed.  She did not have some vague mild chest discomfort but she reports that this was very mild.  She has had continued nausea and anorexia.  She was not having any jaw or arm pain.  She had an episode of syncope two weeks ago while at church but she has not had any since then and no presyncope or orthostasis.  She has not have new SOB, PND or orthopnea.  In the ED she is found to have bradycardia.  She was hypotensive.  Creat is acutely elevated and lactic acid is elevated.  WBC is 27.7.   Her BP has responded to hydration.  We are consulted secondary to a troponin of 2.05.     Past Medical History:  Diagnosis Date  . Anxiety   . Carotid stenosis   . Fibromyalgia   . HLD (hyperlipidemia)   . Hx of cardiovascular stress test    a. Lex MV 11/13: + significant ECG changes and CHB with infusion; EF 81%, no ischemia,   . Hx of echocardiogram    a. Echo 12/12: EF 70-17%, grade 2 diastolic dysfunction, mild MR.; b. Echo 11/13:  mild LVH, EF 60-65%  . Neuropathy   . Osteoporosis   . Syncope 08/2009   a. ETT-Echo 1/11:  poor ex tol, submax exercise, no WMA or ECG changes c/w ischemia;      Past Surgical History:  Procedure Laterality Date  . None      Allergies  Allergen Reactions  . Bee Venom Anaphylaxis and Hives    (Not in a hospital admission) Family History  Problem  Relation Age of Onset  . CAD Mother 59       Status post PCI  . Lung cancer Father   . Colon cancer Neg Hx     Social History   Social History  . Marital status: Married    Spouse name: N/A  . Number of children: 2  . Years of education: N/A   Occupational History  . Not on file.   Social History Main Topics  . Smoking status: Current Every Day Smoker    Packs/day: 0.50    Types: Cigarettes  . Smokeless tobacco: Current User     Comment: vapor cig.  Marland Kitchen Alcohol use Yes     Comment: glass wine once every 6 mos. per patient  . Drug use: No  . Sexual activity: Not on file   Other Topics Concern  . Not on file   Social History Narrative   Lives with husband        ROS:    As stated in the HPI and negative for all other systems.  Physical Exam: Blood pressure (!) 109/51, pulse (!) 43, temperature (!) 96 F (35.6 C), temperature source Rectal, resp. rate (!) 23, height 4\' 11"  (1.499 m),  weight 82 lb (37.2 kg), SpO2 97 %. Marland [place] GENERAL:  Frail appearing and chronically ill.  No acute distress.  HEENT:  Pupils equal round and reactive, fundi not visualized, oral mucosa unremarkable NECK:  No jugular venous distention, waveform within normal limits, carotid upstroke brisk and symmetric, no bruits, no thyromegaly LYMPHATICS:  No cervical, inguinal adenopathy LUNGS:  Clear to auscultation bilaterally BACK:  No CVA tenderness CHEST:  Unremarkable HEART:  PMI not displaced or sustained,S1 and S2 within normal limits, no S3, no S4, no clicks, no rubs, no murmurs ABD:  Flat, positive bowel sounds normal in frequency in pitch, no bruits, no rebound, no guarding, no midline pulsatile mass, no hepatomegaly, no splenomegaly EXT:  2 plus pulses throughout, no edema, no cyanosis no clubbing SKIN:  No rashes no nodules NEURO:  Cranial nerves II through XII grossly intact, motor grossly intact throughout, diffuse muscle wasting.  PSYCH:  Cognitively intact, oriented to person place and  time   Labs: Lab Results  Component Value Date   BUN 36 (H) 04/20/2017   Lab Results  Component Value Date   CREATININE 2.80 (H) 04/20/2017   Lab Results  Component Value Date   NA 137 04/20/2017   K 4.8 04/20/2017   CL 102 04/20/2017   CO2 18 (L) 04/20/2017   No results found for: TROPONINI Lab Results  Component Value Date   WBC 27.7 (H) 04/20/2017   HGB 13.6 04/20/2017   HCT 40.0 04/20/2017   MCV 85.9 04/20/2017   PLT 709 (H) 04/20/2017   No results found for: CHOL, HDL, LDLCALC, LDLDIRECT, TRIG, CHOLHDL Lab Results  Component Value Date   ALT 48 04/20/2017   AST 113 (H) 04/20/2017   ALKPHOS 238 (H) 04/20/2017   BILITOT 0.4 04/20/2017     Radiology:   CXR: Monitoring leads overlie the patient. Normal cardiac and mediastinal  contours. Grossly unchanged coarse interstitial pulmonary opacities. Biapical pleuroparenchymal thickening. No pleural effusion or Pneumothorax.  EKG:  Sinus bradycardia, rate 39 with anterior T wave inversion.  T wave inversion is new compared with EKG 6/24 and rate is slower.   ASSESSMENT AND PLAN:   ELEVATED TROPONIN:  I don't suspect that an acute coronary syndrome is the primary event. Somewhat nonspecific given the AKI.  She does have acute EKG changes. I did a bedside echo and the overall EF appears to be relatively well controlled although the images were suboptimal to evaluate regional wall motion.  No effusion.  I suspect that the overall problem is possibly sepsis with a GI process that has been ongoing leading to the failure to thrive and weight loss.  For now we can continue the heparin and continue to cycle enzymes.  Check a full echo.  Follow up EKGs.     SignedMinus Breeding 04/20/2017, 2:42 PM

## 2017-04-20 NOTE — ED Provider Notes (Signed)
[facility] DEPT Provider Note   CSN: [id number] Arrival date & time: 04/20/17  1018     History   Chief Complaint Chief Complaint  Patient presents with  . Weakness  . Emesis    HPI Alexandra House is a 71 y.o. female.  HPI Patient presents to the emergency room for evaluation of persistent nausea and vomiting and weakness. Patient started having symptoms several weeks ago. She came to the emergency room on both June 24 as well as July 1 because of worsening symptoms. Patient states she's been vomiting a couple times per day. Anytime she tries to eat or drink she will vomit. She's not had any diarrhea but has noticed some dark stools. She denies any abdominal pain but feels that she is having pain in her left upper back. She denies any shortness of breath. No headache. No chest pain. Patient has had additional outpatient workup by her primary doctor as well as a GI doctor. She had an abdominal ultrasound on July 5 that was unremarkable. She also had an endoscopy according to her husband. Past Medical History:  Diagnosis Date  . Anxiety   . Carotid stenosis   . Fibromyalgia   . HLD (hyperlipidemia)   . Hx of cardiovascular stress test    a. Lex MV 11/13: + significant ECG changes and CHB with infusion; EF 81%, no ischemia,   . Hx of echocardiogram    a. Echo 12/12: EF 71-06%, grade 2 diastolic dysfunction, mild MR.; b. Echo 11/13:  mild LVH, EF 60-65%  . Neuropathy   . Osteoporosis   . Syncope 08/2009   a. ETT-Echo 1/11:  poor ex tol, submax exercise, no WMA or ECG changes c/w ischemia;      Patient Active Problem List   Diagnosis Date Noted  . Cerebrovascular disease 04/06/2013  . Noninfectious gastroenteritis and colitis 04/01/2013  . Bruit 01/06/2013  . Nonspecific abnormal unspecified cardiovascular function study 01/06/2013  . Chest pain 08/28/2012  . CARDIAC MURMUR 12/05/2009  . PURE HYPERCHOLESTEROLEMIA 09/12/2009  . TOBACCO ABUSE 09/12/2009  . FIBROMYALGIA  09/12/2009  . SYNCOPE 09/12/2009    History reviewed. No pertinent surgical history.  OB History    No data available       Home Medications    Prior to Admission medications   Medication Sig Start Date End Date Taking? Authorizing Provider  benzonatate (TESSALON) 100 MG capsule Take 100 mg by mouth every 8 (eight) hours as needed for cough. 04/11/17  Yes [provider]  BREO ELLIPTA 100-25 MCG/INH AEPB Inhale 1 puff into the lungs daily. 03/08/17  Yes [provider]  HYDROcodone-homatropine (HYCODAN) 5-1.5 MG/5ML syrup Take 5 mLs by mouth every 6 (six) hours as needed for cough.    Yes [provider]  omeprazole (PRILOSEC) 40 MG capsule Take 40 mg by mouth daily. 04/11/17  Yes [provider]  ranitidine (ZANTAC) 150 MG capsule Take 1 capsule (150 mg total) by mouth daily. 04/07/17  Yes Mu, Pilar Plate, MD  albuterol (PROVENTIL HFA;VENTOLIN HFA) 108 (90 BASE) MCG/ACT inhaler Inhale 2 puffs into the lungs every 6 (six) hours as needed for wheezing or shortness of breath.     [provider]  budesonide-formoterol (SYMBICORT) 160-4.5 MCG/ACT inhaler Inhale 2 puffs into the lungs as needed.    [provider]  calcium carbonate (OS-CAL) 600 MG TABS Take 600 mg by mouth 2 (two) times daily with a meal.    [provider]  denosumab (PROLIA) 60 MG/ML  SOLN injection Inject 60 mg into the skin every 6 (six) months. Administer in upper arm, thigh, or abdomen    [provider]  EPINEPHrine (EPIPEN JR) 0.15 MG/0.3ML injection Inject 0.15 mg into the muscle as needed for anaphylaxis.     [provider]  ibuprofen (ADVIL,MOTRIN) 200 MG tablet Take 200-400 mg by mouth every 6 (six) hours as needed for mild pain.     [provider]  LORazepam (ATIVAN) 1 MG tablet Take 1 mg by mouth 2 (two) times daily as needed for anxiety.     [provider]  nitroGLYCERIN (NITROSTAT) 0.4 MG SL tablet Place 1 tablet (0.4  mg total) under the tongue every 5 (five) minutes as needed for chest pain. 08/28/12 08/28/13  Richardson Dopp T, PA-C  ondansetron (ZOFRAN) 4 MG tablet Take 1 tablet (4 mg total) by mouth every 8 (eight) hours as needed for nausea or vomiting. 04/07/17   Mu, Pilar Plate, MD  pravastatin (PRAVACHOL) 40 MG tablet Take 1 tablet (40 mg total) by mouth every evening. 04/06/13 04/07/17  Lelon Perla, MD    Family History Family History  Problem Relation Age of Onset  . CAD Mother        Status post PCI  . Colon cancer Neg Hx     Social History Social History  Substance Use Topics  . Smoking status: Current Every Day Smoker    Packs/day: 0.50    Types: Cigarettes  . Smokeless tobacco: Current User     Comment: vapor cig.  Marland Kitchen Alcohol use Yes     Comment: glass wine once every 6 mos. per patient     Allergies   Bee venom   Review of Systems Review of Systems  Constitutional: Negative for fever.  Respiratory: Negative for shortness of breath.   Gastrointestinal: Positive for nausea.  Neurological: Positive for weakness.  All other systems reviewed and are negative.    Physical Exam Updated Vital Signs BP (!) 93/39   Pulse (!) 32   Resp (!) 23   Ht 1.499 m (4\' 11" )   Wt 37.2 kg (82 lb)   SpO2 96%   BMI 16.56 kg/m   Physical Exam  Constitutional: She appears distressed.  Elderly, frail  HENT:  Head: Normocephalic and atraumatic.  Right Ear: External ear normal.  Left Ear: External ear normal.  Eyes: Conjunctivae are normal. Right eye exhibits no discharge. Left eye exhibits no discharge. No scleral icterus.  Neck: Neck supple. No tracheal deviation present.  Cardiovascular: Normal rate, regular rhythm and intact distal pulses.   Pulmonary/Chest: Effort normal and breath sounds normal. No stridor. No respiratory distress. She has no wheezes. She has no rales.  Abdominal: Soft. Bowel sounds are normal. She exhibits no distension. There is tenderness. There is no rebound and  no guarding.  Mild in the epigastric region  Genitourinary:  Genitourinary Comments: Brown stool  Musculoskeletal: She exhibits no edema or tenderness.  Neurological: She is alert. No cranial nerve deficit (no facial droop, extraocular movements intact, no slurred speech) or sensory deficit. She exhibits normal muscle tone. She displays no seizure activity. Coordination normal.  Generalized weakness  Skin: Skin is warm and dry. No rash noted. There is pallor.  Psychiatric: She has a normal mood and affect.  Nursing note and vitals reviewed.    ED Treatments / Results  Labs (all labs ordered are listed, but only abnormal results are displayed) Labs Reviewed  COMPREHENSIVE METABOLIC PANEL - Abnormal; Notable for  the following:       Result Value   CO2 18 (*)    Glucose, Bld 140 (*)    BUN 35 (*)    Creatinine, Ser 3.09 (*)    Calcium 8.1 (*)    Albumin 2.5 (*)    AST 113 (*)    Alkaline Phosphatase 238 (*)    GFR calc non Af Amer 14 (*)    GFR calc Af Amer 16 (*)    Anion gap 16 (*)    All other components within normal limits  CBC WITH DIFFERENTIAL/PLATELET - Abnormal; Notable for the following:    WBC 27.7 (*)    Hemoglobin 10.9 (*)    HCT 34.0 (*)    Platelets 709 (*)    All other components within normal limits  I-STAT CG4 LACTIC ACID, ED - Abnormal; Notable for the following:    Lactic Acid, Venous 6.39 (*)    All other components within normal limits  I-STAT TROPOININ, ED - Abnormal; Notable for the following:    Troponin i, poc 2.05 (*)    All other components within normal limits  I-STAT VENOUS BLOOD GAS, ED - Abnormal; Notable for the following:    pH, Ven 7.224 (*)    pO2, Ven 28.0 (*)    Bicarbonate 19.3 (*)    Acid-base deficit 8.0 (*)    All other components within normal limits  I-STAT CHEM 8, ED - Abnormal; Notable for the following:    BUN 36 (*)    Creatinine, Ser 2.80 (*)    Glucose, Bld 132 (*)    Calcium, Ion 1.01 (*)    All other components  within normal limits  CULTURE, BLOOD (ROUTINE X 2)  CULTURE, BLOOD (ROUTINE X 2)  URINE CULTURE  PROTIME-INR  LIPASE, BLOOD  URINALYSIS, ROUTINE W REFLEX MICROSCOPIC  I-STAT CG4 LACTIC ACID, ED  POC OCCULT BLOOD, ED  TYPE AND SCREEN  ABO/RH    EKG  EKG Interpretation  Date/Time:  Saturday April 20 2017 11:08:25 EDT Ventricular Rate:  39 PR Interval:    QRS Duration: 99 QT Interval:  617 QTC Calculation: 497 R Axis:   88 Text Interpretation:  Sinus bradycardia Borderline right axis deviation Abnrm T, consider ischemia, anterolateral lds bradycardia and t wave changes are new since last tracing Confirmed by Dorie Rank [phone]) on 04/20/2017 11:26:33 AM Also confirmed by Dorie Rank [phone]), editor Drema Pry [phone])  on 04/20/2017 11:41:17 AM       Radiology Dg Chest Port 1 View  Result Date: 04/20/2017 CLINICAL DATA:  Patient with continued weakness, nausea and vomiting. EXAM: PORTABLE CHEST 1 VIEW COMPARISON:  Chest radiograph 04/14/2017 FINDINGS: Monitoring leads overlie the patient. Normal cardiac and mediastinal contours. Grossly unchanged coarse interstitial pulmonary opacities. Biapical pleuroparenchymal thickening. No pleural effusion or pneumothorax. IMPRESSION: No acute cardiopulmonary process. Chronic interstitial pulmonary opacities. Electronically Signed   By: Lovey Newcomer M.D.   On: 04/20/2017 11:14    Procedures Procedures (including critical care time)  Medications Ordered in ED Medications  sodium chloride 0.9 % bolus 1,000 mL (1,000 mLs Intravenous New Bag/Given 04/20/17 1203)    And  sodium chloride 0.9 % bolus 250 mL (0 mLs Intravenous Stopped 04/20/17 1209)  piperacillin-tazobactam (ZOSYN) IVPB 3.375 g (3.375 g Intravenous New Bag/Given 04/20/17 1204)  vancomycin (VANCOCIN) IVPB 1000 mg/200 mL premix (1,000 mg Intravenous New Bag/Given 04/20/17 1210)  aspirin chewable tablet 324 mg (not administered)  sodium chloride 0.9 % bolus 1,000 mL (0 mLs  Intravenous  Stopped 04/20/17 1132)    And  sodium chloride 0.9 % bolus 250 mL (250 mLs Intravenous New Bag/Given 04/20/17 1202)     Initial Impression / Assessment and Plan / ED Course  I have reviewed the triage vital signs and the nursing notes.  Pertinent labs & imaging results that were available during my care of the patient were reviewed by me and considered in my medical decision making (see chart for details).  Clinical Course as of Apr 21 1211  Sat Apr 20, 2017  1057 ABD [place] result reviewed from earlier 2 days ago. No evidence of abdominal aortic aneurysm Family also states that she had an endoscopy that showed a hiatal hernia but otherwise normal  [JK]  1105 Previous imaging tests reviewed. Patient also had a CT scan of her chest on March 18. No evidence of any aneurysm.  [RD]  4081 Patient presents to the emergency room with persistent nausea vomiting and weakness. She is hypotensive and bradycardic in the emergency room. We'll start with fluid hydration. EKG and cardiac enzymes ordered in addition to sepsis and abd pain workup.  [JK]  1128 Blood pressure is improving with fluids. Laboratory tests are notable for what appears to be a metabolic acidosis. Patient also has an elevated lactic acid level. Her history is not suggestive of an infectious etiology but I will cover her with antibiotics to cover for possible sepsis.  [JK]  1152 Her persistent bradycardia is concerning.  CMET is not back.  I am concerned about acute renal failure as a possibility causing her bradycardia, elevated troponin an her nausea.  Possible nstemi as well  [JK]  1210 Discussed with Dr Percival Spanish, cardiology.  Will see patient.  I will consult with the medical service considering her multiple other medical issues  [JK]    Clinical Course User Index [JK] Dorie Rank, MD   Patient presents to the emergency room for evaluation of persistent issues with nausea vomiting.  She has had an extensive outpatient workup. Patient today  however is hypotensive and bradycardic. Elevated troponin concerning for nstemi.  Bradycardia but no heart block.  Her laboratory tests are also concerning for the possibility of sepsis. We'll continue treatment for possible sepsis and an NSTEMI. Admit to the hospital for further treatment  Final Clinical Impressions(s) / ED Diagnoses   Final diagnoses:  NSTEMI (non-ST elevated myocardial infarction) ([place])  AKI (acute kidney injury) ([place])  Elevated lactic acid level      Dorie Rank, MD 04/20/17 1214

## 2017-04-20 NOTE — Plan of Care (Signed)
PULMONARY / CRITICAL CARE MEDICINE   Name: [name] MRN: [id number] DOB: [birth year]    ADMISSION DATE:  04/20/2017 CHIEF COMPLAINT:   Abdominal pain, nausea vomitting, weight loss, weakness  HISTORY OF PRESENT ILLNESS:    Alexandra House is a 71 y.o. female with medical history significant for tobacco abuse with COPD, osteoporosis, dyslipidemia, bilateral carotid stenosis, history of syncope, and history of depression. For the last 4 weeks patient has developed intractable nausea and vomiting without abdominal pain. She has been unable to keep down anything that she has eaten or drunk. She is not having dysphagia. She is unable to keep medications down.   She has been to the ER on several occasions with no definitive etiology found and has briefly improved after the administration of IV fluids. She has undergone outpatient ultrasound of the abdomen and EGD which are also unrevealing. She is not had any fevers or chills. She's had a weight loss of at least 10 pounds over 4 weeks.   She's not had any blood in her emesis. Since developing the symptoms she has lost the urge to smoke. Husband reports that for 6 months prior to the symptoms patient's activity tolerance has markedly decreased and she primarily complained of dyspnea on exertion noting she was no longer able to do activities she likes such as going shopping.   Today she presents to the ER with symptoms as above. She was noted to be hypothermic, tachypneic, bradycardic with pulses as low as 32 bpm, initial blood pressure was 79/39 with an MAP of 48. She was not hypoxemic. She had an acute kidney injury with a creatinine of 3.09. Lactic acid was elevated at 6.39. White count was 27,700 with left shift, platelets were also elevated at 709,000. Troponin elevated at 2.05.   Clinically she appears significantly dehydrated. She had poor capillary refill and her color was poor noting she was quite pale in appearance.   She has received  normal saline bolus in the ER her blood pressure has rebounded to 107/35   Patient having nausea and vomitting off and on for days now. Recent ABG USG 04/18/17 unrevealing. Weight loss and weakness present on admission. Also vague abdominal discomfort present.   She was not having any jaw or arm pain.  She had an episode of syncope two weeks ago while at church but she has not had any since then and no presyncope or orthostasis.  She has not have new SOB, PND or orthopnea.  In the ED she is found to have bradycardia.  She was hypotensive.  Creat is acutely elevated and lactic acid is elevated.  WBC is 27.7.   Her BP has responded to hydration.    PAST MEDICAL HISTORY :  She  has a past medical history of Anxiety; Carotid stenosis; Fibromyalgia; HLD (hyperlipidemia); cardiovascular stress test; echocardiogram; Neuropathy; Osteoporosis; and Syncope (08/2009).  PAST SURGICAL HISTORY: She  has a past surgical history that includes None.  Allergies  Allergen Reactions  . Bee Venom Anaphylaxis and Hives    No current facility-administered medications on file prior to encounter.    Current Outpatient Prescriptions on File Prior to Encounter  Medication Sig  . BREO ELLIPTA 100-25 MCG/INH AEPB Inhale 1 puff into the lungs daily.  . budesonide-formoterol (SYMBICORT) 160-4.5 MCG/ACT inhaler Inhale 2 puffs into the lungs as needed.  . calcium carbonate (OS-CAL) 600 MG TABS Take 600 mg by mouth 2 (two) times daily with a meal.  . denosumab (PROLIA) 60  MG/ML SOLN injection Inject 60 mg into the skin every 6 (six) months. Administer in upper arm, thigh, or abdomen  . HYDROcodone-homatropine (HYCODAN) 5-1.5 MG/5ML syrup Take 5 mLs by mouth every 6 (six) hours as needed for cough.   Marland Kitchen LORazepam (ATIVAN) 1 MG tablet Take 1 mg by mouth 2 (two) times daily as needed for anxiety.   . ondansetron (ZOFRAN) 4 MG tablet Take 1 tablet (4 mg total) by mouth every 8 (eight) hours as needed for nausea or vomiting.   . ranitidine (ZANTAC) 150 MG capsule Take 1 capsule (150 mg total) by mouth daily.  Marland Kitchen albuterol (PROVENTIL HFA;VENTOLIN HFA) 108 (90 BASE) MCG/ACT inhaler Inhale 2 puffs into the lungs every 6 (six) hours as needed for wheezing or shortness of breath.   . EPINEPHrine (EPIPEN JR) 0.15 MG/0.3ML injection Inject 0.15 mg into the muscle as needed for anaphylaxis.   Marland Kitchen ibuprofen (ADVIL,MOTRIN) 200 MG tablet Take 200-400 mg by mouth every 6 (six) hours as needed for mild pain.   . pravastatin (PRAVACHOL) 40 MG tablet Take 1 tablet (40 mg total) by mouth every evening. (Patient not taking: Reported on 04/20/2017)    FAMILY HISTORY:  Her indicated that her mother is deceased. She indicated that her father is deceased. She indicated that the status of her neg hx is unknown.    SOCIAL HISTORY: She  reports that she has been smoking Cigarettes.  She has been smoking about 0.50 packs per day. She uses smokeless tobacco. She reports that she drinks alcohol. She reports that she does not use drugs.  REVIEW OF SYSTEMS:   As above VITAL SIGNS: BP 117/75 (BP Location: Right Arm)   Pulse (!) 46   Temp 97.8 F (36.6 C) (Rectal)   Resp 15   Ht 4\' 11"  (1.499 m)   Wt 42.8 kg (94 lb 6.4 oz)   SpO2 100%   BMI 19.07 kg/m   INTAKE / OUTPUT: No intake/output data recorded.  PHYSICAL EXAMINATION: General:  Alert awake oriented 3 , sick appearance, lethargic Neuro:  Nonfocal, follows commands HEENT:  Atraumatic normocephalic PERRLA EOMI Cardiovascular:  S1-S2 positive, Lungs:  Clear to auscultation bilaterally Abdomen:  Soft vaguely tender nondistended bowel sounds present and faint Musculoskeletal:  No cyanosis clubbing edema Skin:  No rashes  LABS:  BMET  Recent Labs Lab 04/14/17 1433 04/20/17 1050 04/20/17 1156  NA 131* 135 137  K 3.9 4.9 4.8  CL 103 101 102  CO2 21* 18*  --   BUN 17 35* 36*  CREATININE 1.08* 3.09* 2.80*  GLUCOSE 131* 140* 132*    Electrolytes  Recent Labs Lab  04/14/17 1433 04/20/17 1050 04/20/17 1340  CALCIUM 8.1* 8.1*  --   MG  --   --  1.8  PHOS  --   --  5.2*    CBC  Recent Labs Lab 04/14/17 1433 04/20/17 1050 04/20/17 1156  WBC 14.6* 27.7*  --   HGB 11.1* 10.9* 13.6  HCT 33.1* 34.0* 40.0  PLT 521* 709*  --     Coag's  Recent Labs Lab 04/20/17 1050  INR 1.10    Sepsis Markers  Recent Labs Lab 04/14/17 1449 04/20/17 1119 04/20/17 1340 04/20/17 1353  LATICACIDVEN 1.03 6.39*  --  3.80*  PROCALCITON  --   --  1.07  --     ABG No results for input(s): PHART, PCO2ART, PO2ART in the last 168 hours.  Liver Enzymes  Recent Labs Lab 04/14/17 1433 04/20/17 1050  AST 46* 113*  ALT 21 48  ALKPHOS 148* 238*  BILITOT 0.3 0.4  ALBUMIN 2.5* 2.5*    Cardiac Enzymes  Recent Labs Lab 04/20/17 1340  TROPONINI 2.02*    Glucose No results for input(s): GLUCAP in the last 168 hours.  Imaging Ct Abdomen Pelvis Wo Contrast  Result Date: 04/20/2017 CLINICAL DATA:  Patient with weakness, nausea and vomiting. EXAM: CT CHEST, ABDOMEN AND PELVIS WITHOUT CONTRAST TECHNIQUE: Multidetector CT imaging of the chest, abdomen and pelvis was performed following the standard protocol without IV contrast. COMPARISON:  CT chest 01/07/2017 FINDINGS: CT CHEST FINDINGS Cardiovascular: Normal heart size. No pericardial effusion. Aortic atherosclerosis. Coronary arterial atherosclerosis. Mediastinum/Nodes: Interval development of mediastinal adenopathy including a 1.1 cm subcarinal lymph node (image 27; series 3) and a 1.0 cm prevascular lymph node. Additionally there is new fullness within the hila bilaterally most suggestive of hilar adenopathy, poorly visualized without IV contrast. No axillary adenopathy. Small hiatal hernia. Lungs/Pleura: Centrilobular and paraseptal emphysematous changes. Interval development of patchy irregular consolidative opacities throughout the lungs bilaterally with a somewhat basilar predominance. Additionally  there is new peribronchial thickening. No pleural effusion or pneumothorax. Musculoskeletal: No aggressive or acute appearing osseous lesions. CT ABDOMEN PELVIS FINDINGS Hepatobiliary: Liver is diffusely low in attenuation compatible with steatosis. Additionally there is suggestion of mild gallbladder wall thickening measuring up to 7 mm (image 66; series 3). There is fat stranding within the porta hepatis and about the pancreas. Small amount of fluid inferior to the right hepatic lobe. Pancreas: Mild parenchymal atrophy. Small amount of surrounding fat stranding. Spleen: Unremarkable Adrenals/Urinary Tract: Adrenal glands are normal. Kidneys are symmetric in size. No hydronephrosis. Urinary bladder is unremarkable. Stomach/Bowel: No abnormal bowel wall thickening or evidence for bowel obstruction. Decompressed sigmoid colon. Sigmoid colonic diverticulosis without evidence for acute diverticulitis. No free intraperitoneal air. Vascular/Lymphatic: Normal caliber abdominal aorta. No retroperitoneal lymphadenopathy. Reproductive: Uterus and adnexal structures are unremarkable. Other: None. Musculoskeletal: No aggressive or acute appearing osseous lesions. IMPRESSION: Interval development of irregular patchy consolidative opacities throughout the lungs bilaterally concerning for the possibility of multifocal infection. There is fat stranding within the porta hepatis and about the pancreatic parenchyma. Findings are nonspecific in etiology and may be reactive. Sequelae of acalculous cholecystitis, pancreatitis or potentially hepatitis are not excluded. Recommend clinical and laboratory correlation. Hepatic steatosis. These results will be called to the ordering clinician or representative by the Radiologist Assistant, and communication documented in the PACS or zVision Dashboard. Electronically Signed   By: Lovey Newcomer M.D.   On: 04/20/2017 16:03   Ct Chest Wo Contrast  Result Date: 04/20/2017 CLINICAL DATA:  Patient  with weakness, nausea and vomiting. EXAM: CT CHEST, ABDOMEN AND PELVIS WITHOUT CONTRAST TECHNIQUE: Multidetector CT imaging of the chest, abdomen and pelvis was performed following the standard protocol without IV contrast. COMPARISON:  CT chest 01/07/2017 FINDINGS: CT CHEST FINDINGS Cardiovascular: Normal heart size. No pericardial effusion. Aortic atherosclerosis. Coronary arterial atherosclerosis. Mediastinum/Nodes: Interval development of mediastinal adenopathy including a 1.1 cm subcarinal lymph node (image 27; series 3) and a 1.0 cm prevascular lymph node. Additionally there is new fullness within the hila bilaterally most suggestive of hilar adenopathy, poorly visualized without IV contrast. No axillary adenopathy. Small hiatal hernia. Lungs/Pleura: Centrilobular and paraseptal emphysematous changes. Interval development of patchy irregular consolidative opacities throughout the lungs bilaterally with a somewhat basilar predominance. Additionally there is new peribronchial thickening. No pleural effusion or pneumothorax. Musculoskeletal: No aggressive or acute appearing osseous lesions. CT ABDOMEN PELVIS FINDINGS Hepatobiliary:  Liver is diffusely low in attenuation compatible with steatosis. Additionally there is suggestion of mild gallbladder wall thickening measuring up to 7 mm (image 66; series 3). There is fat stranding within the porta hepatis and about the pancreas. Small amount of fluid inferior to the right hepatic lobe. Pancreas: Mild parenchymal atrophy. Small amount of surrounding fat stranding. Spleen: Unremarkable Adrenals/Urinary Tract: Adrenal glands are normal. Kidneys are symmetric in size. No hydronephrosis. Urinary bladder is unremarkable. Stomach/Bowel: No abnormal bowel wall thickening or evidence for bowel obstruction. Decompressed sigmoid colon. Sigmoid colonic diverticulosis without evidence for acute diverticulitis. No free intraperitoneal air. Vascular/Lymphatic: Normal caliber  abdominal aorta. No retroperitoneal lymphadenopathy. Reproductive: Uterus and adnexal structures are unremarkable. Other: None. Musculoskeletal: No aggressive or acute appearing osseous lesions. IMPRESSION: Interval development of irregular patchy consolidative opacities throughout the lungs bilaterally concerning for the possibility of multifocal infection. There is fat stranding within the porta hepatis and about the pancreatic parenchyma. Findings are nonspecific in etiology and may be reactive. Sequelae of acalculous cholecystitis, pancreatitis or potentially hepatitis are not excluded. Recommend clinical and laboratory correlation. Hepatic steatosis. These results will be called to the ordering clinician or representative by the Radiologist Assistant, and communication documented in the PACS or zVision Dashboard. Electronically Signed   By: Lovey Newcomer M.D.   On: 04/20/2017 16:03   Dg Chest Port 1 View  Result Date: 04/20/2017 CLINICAL DATA:  Patient with continued weakness, nausea and vomiting. EXAM: PORTABLE CHEST 1 VIEW COMPARISON:  Chest radiograph 04/14/2017 FINDINGS: Monitoring leads overlie the patient. Normal cardiac and mediastinal contours. Grossly unchanged coarse interstitial pulmonary opacities. Biapical pleuroparenchymal thickening. No pleural effusion or pneumothorax. IMPRESSION: No acute cardiopulmonary process. Chronic interstitial pulmonary opacities. Electronically Signed   By: Lovey Newcomer M.D.   On: 04/20/2017 11:14    DISCUSSION:  We were consulted for admission to ICU however since BP has responded well with IVF and BP is now in normal range and saturation is 98% on room air, she will get admitted to step down unit.  Cardiology has seen the patient for NSTEMI and patient is on heparin gtt for anticoagulation.  CT A/P non specific stranding around liver and pancreas with normal LFTs. I suspect mesentric ischemia considering her history of weight loss and extensive GI work up  which has been unrevealing , lactic acidosis and inability to keep anything down.  IVF and anticoagulation should help her. Agree with broad spectrum antibiotics. Will add bicarbonate gtt to help compensate for metabolic acidosis. CT chest also showing patchy opacity suggestive of concomitant pneumonia  Patient is DNR.  Would recommend GI consultation also. With her Cr being elevated, CT Angio of abdomen may not be an option. Colonoscopy/EGD can help when suspecting mesentric ischemia. Would leave it upto GI specialist to decide on that.  Follow serial lactate, her initial value was 6.8 which improved to 3.0 after resuscitation  Patient is critical and should she get worse or hypotensive, please call us back and we would be happy to provide critical care services for her.   As of now, her BP is 117/75, HR 45, Saturation 98% on room air, Temp 97.8. Venous blood gas showing metabolic acidosis with inadequate respiratory compensation.   FAMILY  D/w husband at bedside  STAFF NOTE: I, Sharia Reeve, MD have personally reviewed patient's available data, including medical history, events of note, physical examination and test results as part of my evaluation. The patient is critically ill with multiple organ systems failure and requires high complexity  decision making for assessment and support, frequent evaluation and titration of therapies, application of advanced monitoring technologies and extensive interpretation of multiple databases.   Critical Care Time devoted to patient care services described in this note is 40  Minutes. This time reflects time of care of this signee: Sharia Reeve, MD.    [facility] Pager: [phone]  04/20/2017, 5:18 PM

## 2017-04-20 NOTE — Progress Notes (Signed)
Spoke with Dr Aggie Moats, pt does not want a foley catheter. MD stated pt would have to have foley placed for I and O if she is unable to void. Will continue to monitor. Placed pt on warming pad with rectal probe.

## 2017-04-20 NOTE — Progress Notes (Signed)
ANTICOAGULATION CONSULT NOTE - Follow Up Consult  Pharmacy Consult for Heparin Indication: chest pain/ACS  Allergies  Allergen Reactions  . Bee Venom Anaphylaxis and Hives    Patient Measurements: Height: 4\' 11"  (149.9 cm) Weight: 94 lb 6.4 oz (42.8 kg) IBW/kg (Calculated) : 43.2  Vital Signs: Temp: 98.2 F (36.8 C) (07/07 1933) Temp Source: Oral (07/07 1933) BP: 104/43 (07/07 1933) Pulse Rate: 46 (07/07 1933)  Labs:  Recent Labs  04/20/17 1050 04/20/17 1156 04/20/17 1340 04/20/17 2004  HGB 10.9* 13.6  --   --   HCT 34.0* 40.0  --   --   PLT 709*  --   --   --   LABPROT 14.2  --   --   --   INR 1.10  --   --   --   HEPARINUNFRC  --   --   --  <0.10*  CREATININE 3.09* 2.80*  --   --   TROPONINI  --   --  2.02*  --     Estimated Creatinine Clearance: 12.1 mL/min (A) (by C-G formula based on SCr of 2.8 mg/dL (H)).   Medications:  Heparin @ 300 units/hr  Assessment: 71yof started on heparin earlier today for possible ACS. Initial heparin level is < 0.10. No issues with infusion.   Goal of Therapy:  Heparin level 0.3-0.7 units/ml Monitor platelets by anticoagulation protocol: Yes   Plan:  1) Increase heparin to 500 units/hr 2) Follow up daily heparin level  Deboraha Sprang 04/20/2017,8:55 PM

## 2017-04-20 NOTE — ED Notes (Signed)
Tomi Bamberger, Md aware of one IV access, IV team order placed

## 2017-04-20 NOTE — Progress Notes (Signed)
Bladder scan only shows 73mls, MD notified.

## 2017-04-20 NOTE — H&P (Signed)
History and Physical    Alexandra House SEG:[id number] DOB: 1946/09/01 DOA: 04/20/2017   PCP: Marton Redwood, MD   Patient coming from/Resides with: Private residence/husband  Chief Complaint: Generalized weakness with intractable nausea and vomiting for 4 weeks  HPI: Alexandra House is a 71 y.o. female with medical history significant for tobacco abuse with COPD, osteoporosis, dyslipidemia, bilateral carotid stenosis, history of syncope, and history of depression. For the last 4 weeks patient has developed intractable nausea and vomiting without abdominal pain. She has been unable to keep down anything that she has eaten or drunk. She is not having dysphagia. She is unable to keep medications down. She has been to the ER on several occasions with no definitive etiology found and has briefly improved after the administration of IV fluids. She has undergone outpatient ultrasound of the abdomen and EGD which are also unrevealing. She is not had any fevers or chills. She's had a weight loss of at least 10 pounds over 4 weeks. She's not had any blood in her emesis. Since developing the symptoms she has lost the urge to smoke. Husband reports that for 6 months prior to the symptoms patient's activity tolerance has markedly decreased and she primarily complained of dyspnea on exertion noting she was no longer able to do activities she likes such as going shopping.   Today she presents to the ER with symptoms as above. She was noted to be hypothermic, tachypneic, bradycardic with pulses as low as 32 bpm, initial blood pressure was 79/39 with an MAP of 48. She was not hypoxemic. She had an acute kidney injury with a creatinine of 3.09. Lactic acid was elevated at 6.39. White count was 27,700 with left shift, platelets were also elevated at 709,000. Troponin elevated at 2.05. Clinically she appears significantly dehydrated. She had poor capillary refill and her color was poor noting she was quite pale in  appearance. She has received 1200 mL normal saline bolus in the ER her blood pressure has rebounded to 107/35 and she is now receiving an additional 500 mL saline bolus followed by IV fluids at 150 mL/hr. and warming blanket history is benign applied. PCCM was consulted and felt the patient was appropriate to be admitted to stepdown unit. IV Levophed is available at the bedside in the event patient's blood pressure decreases again after warming. Discussed extensively with husband and patient is a DO NOT RESUSCITATE although patient/has been agreeable with short-term pressors and utilization of BiPAP if necessary. Patient is awake and oriented. Because of bradycardia and abnormal troponin and cardiology has been consulted by the EDP.  ED Course:  Vital Signs: BP (!) 107/35   Pulse (!) 43   Temp (!) 96 F (35.6 C) (Rectal)   Resp 18   Ht 4\' 11"  (1.499 m)   Wt 37.2 kg (82 lb)   SpO2 100%   BMI 16.56 kg/m  PCXR: Neg Lab data: Sodium 135, potassium 4.9, chloride 101, CO2 18, anion gap 16, glucose 140, BUN 35, creatinine 3.9, alkaline phosphatase 238, albumin 2.5, LFTs normal except for AST 113, troponin 2.05, lactic acid 6.39, white count 27,700 with neutrophils 70% and absolute neutrophils 21.6%, hemoglobin 10.9, platelets 709,000, coags are normal, urinalysis and culture pending collection, blood cultures have been obtained in the ER Medications and treatments: NS bolus 500 mL, Zosyn 3.375 g IV 1, vancomycin 1 g IV 1, atropine 0.4 mg IV 1  Review of Systems:  In addition to the HPI above,  No Fever-chills,  myalgias or other constitutional symptoms No Headache, changes with Vision or hearing, new weakness, tingling, numbness in any extremity, dizziness, dysarthria or word finding difficulty, gait disturbance or imbalance, tremors or seizure activity No problems swallowing food or Liquids, indigestion/reflux, choking or coughing while eating, abdominal pain with or after eating No Chest pain,  Cough, palpitations, orthopnea  No Abdominal pain, melena,hematochezia, dark tarry stools, constipation No dysuria, malodorous urine, hematuria or flank pain No new skin rashes, lesions, masses or bruises, No new joint pains, aches, swelling or redness No recent unintentional weight gain No polyuria, polydypsia or polyphagia   Past Medical History:  Diagnosis Date  . Anxiety   . Carotid stenosis   . Fibromyalgia   . HLD (hyperlipidemia)   . Hx of cardiovascular stress test    a. Lex MV 11/13: + significant ECG changes and CHB with infusion; EF 81%, no ischemia,   . Hx of echocardiogram    a. Echo 12/12: EF 95-09%, grade 2 diastolic dysfunction, mild MR.; b. Echo 11/13:  mild LVH, EF 60-65%  . Neuropathy   . Osteoporosis   . Syncope 08/2009   a. ETT-Echo 1/11:  poor ex tol, submax exercise, no WMA or ECG changes c/w ischemia;      History reviewed. No pertinent surgical history.  Social History   Social History  . Marital status: Married    Spouse name: N/A  . Number of children: N/A  . Years of education: N/A   Occupational History  . Not on file.   Social History Main Topics  . Smoking status: Current Every Day Smoker    Packs/day: 0.50    Types: Cigarettes  . Smokeless tobacco: Current User     Comment: vapor cig.  Marland Kitchen Alcohol use Yes     Comment: glass wine once every 6 mos. per patient  . Drug use: No  . Sexual activity: Not on file   Other Topics Concern  . Not on file   Social History Narrative  . No narrative on file    Mobility: Independent Work history: Not obtained   Allergies  Allergen Reactions  . Bee Venom Anaphylaxis and Hives    Family History  Problem Relation Age of Onset  . CAD Mother        Status post PCI  . Colon cancer Neg Hx      Prior to Admission medications   Medication Sig Start Date End Date Taking? Authorizing Provider  benzonatate (TESSALON) 100 MG capsule Take 100 mg by mouth every 8 (eight) hours as needed for  cough. 04/11/17  Yes [provider]  BREO ELLIPTA 100-25 MCG/INH AEPB Inhale 1 puff into the lungs daily. 03/08/17  Yes [provider]  budesonide-formoterol (SYMBICORT) 160-4.5 MCG/ACT inhaler Inhale 2 puffs into the lungs as needed.   Yes [provider]  calcium carbonate (OS-CAL) 600 MG TABS Take 600 mg by mouth 2 (two) times daily with a meal.   Yes [provider]  denosumab (PROLIA) 60 MG/ML SOLN injection Inject 60 mg into the skin every 6 (six) months. Administer in upper arm, thigh, or abdomen   Yes [provider]  ENSURE PLUS (ENSURE PLUS) LIQD Take 237 mLs by mouth daily.   Yes [provider]  HYDROcodone-homatropine (HYCODAN) 5-1.5 MG/5ML syrup Take 5 mLs by mouth every 6 (six) hours as needed for cough.    Yes [provider]  LORazepam (ATIVAN) 1 MG tablet Take 1 mg by mouth 2 (two) times  daily as needed for anxiety.    Yes [provider]  Multiple Vitamins-Minerals (MULTIVITAMIN GUMMIES ADULT PO) Take 2 tablets by mouth daily.   Yes [provider]  omeprazole (PRILOSEC) 40 MG capsule Take 40 mg by mouth daily. 04/11/17  Yes [provider]  ondansetron (ZOFRAN) 4 MG tablet Take 1 tablet (4 mg total) by mouth every 8 (eight) hours as needed for nausea or vomiting. 04/07/17  Yes Mu, Pilar Plate, MD  ranitidine (ZANTAC) 150 MG capsule Take 1 capsule (150 mg total) by mouth daily. 04/07/17  Yes Mu, Pilar Plate, MD  albuterol (PROVENTIL HFA;VENTOLIN HFA) 108 (90 BASE) MCG/ACT inhaler Inhale 2 puffs into the lungs every 6 (six) hours as needed for wheezing or shortness of breath.     [provider]  EPINEPHrine (EPIPEN JR) 0.15 MG/0.3ML injection Inject 0.15 mg into the muscle as needed for anaphylaxis.     [provider]  ibuprofen (ADVIL,MOTRIN) 200 MG tablet Take 200-400 mg by mouth every 6 (six) hours as needed for mild pain.     [provider]  pravastatin (PRAVACHOL) 40 MG  tablet Take 1 tablet (40 mg total) by mouth every evening. Patient not taking: Reported on 04/20/2017 04/06/13 04/07/17  Lelon Perla, MD    Physical Exam: Vitals:   04/20/17 1215 04/20/17 1225 04/20/17 1230 04/20/17 1245  BP: (!) 71/56  (!) 107/35   Pulse:  (!) 45 (!) 43   Resp: (!) 24 (!) 22 18   Temp:    (!) 96 F (35.6 C)  TempSrc:    Rectal  SpO2: 99% 100% 100%   Weight:      Height:          Constitutional: Sleepy but easily awakened, very pale in appearance and skin quite cool to the touch Eyes: PERRL, lids normal without edema and conjunctivae pale; no scleredema ENMT: Mucous membranes are dry- Posterior pharynx clear of any exudate or lesions. Neck: normal, supple, no masses, no thyromegaly Respiratory: clear to auscultation bilaterally, no wheezing, no crackles but diminished throughout Normal respiratory effort. No accessory muscle use. Not hypoxic but given hypotension and bradycardia 2 L nasal cannula applied.  Cardiovascular: Regular bradycardic rate and sinus rhythm, no murmurs / rubs / gallops. No extremity edema. 2+ pedal pulses. No carotid bruits. Poor capillary refill greater than 2 seconds Abdomen: ?? tenderness left side abdomen/lower quadrant, no masses palpated although questionable fullness of her suprapubic region. No hepatosplenomegaly. Bowel sounds positive but hypoactive.  Musculoskeletal: no clubbing / cyanosis. No joint deformity upper and lower extremities. Good ROM, no contractures. Normal muscle tone.  Skin: no rashes, lesions, ulcers. No induration-very pale and skin quite cool to touch with poor capillary refill is noted above Neurologic: CN 2-12 grossly intact. Sensation intact, DTR normal. Strength 5/5 x all 4 extremities.  Psychiatric: Awakens easily and oriented x 3.    Labs on Admission: I have personally reviewed following labs and imaging studies  CBC:  Recent Labs Lab 04/14/17 1433 04/20/17 1050 04/20/17 1156  WBC 14.6* 27.7*  --     NEUTROABS  --  21.6*  --   HGB 11.1* 10.9* 13.6  HCT 33.1* 34.0* 40.0  MCV 84.7 85.9  --   PLT 521* 709*  --    Basic Metabolic Panel:  Recent Labs Lab 04/14/17 1433 04/20/17 1050 04/20/17 1156  NA 131* 135 137  K 3.9 4.9 4.8  CL 103 101 102  CO2 21* 18*  --   GLUCOSE  131* 140* 132*  BUN 17 35* 36*  CREATININE 1.08* 3.09* 2.80*  CALCIUM 8.1* 8.1*  --    GFR: Estimated Creatinine Clearance: 10.5 mL/min (A) (by C-G formula based on SCr of 2.8 mg/dL (H)). Liver Function Tests:  Recent Labs Lab 04/14/17 1433 04/20/17 1050  AST 46* 113*  ALT 21 48  ALKPHOS 148* 238*  BILITOT 0.3 0.4  PROT 6.8 7.0  ALBUMIN 2.5* 2.5*    Recent Labs Lab 04/14/17 1433 04/20/17 1050  LIPASE 20 19   No results for input(s): AMMONIA in the last 168 hours. Coagulation Profile:  Recent Labs Lab 04/20/17 1050  INR 1.10   Cardiac Enzymes: No results for input(s): CKTOTAL, CKMB, CKMBINDEX, TROPONINI in the last 168 hours. BNP (last 3 results) No results for input(s): PROBNP in the last 8760 hours. HbA1C: No results for input(s): HGBA1C in the last 72 hours. CBG: No results for input(s): GLUCAP in the last 168 hours. Lipid Profile: No results for input(s): CHOL, HDL, LDLCALC, TRIG, CHOLHDL, LDLDIRECT in the last 72 hours. Thyroid Function Tests: No results for input(s): TSH, T4TOTAL, FREET4, T3FREE, THYROIDAB in the last 72 hours. Anemia Panel: No results for input(s): VITAMINB12, FOLATE, FERRITIN, TIBC, IRON, RETICCTPCT in the last 72 hours. Urine analysis:    Component Value Date/Time   COLORURINE YELLOW 04/14/2017 1549   APPEARANCEUR CLEAR 04/14/2017 1549   LABSPEC 1.011 04/14/2017 1549   PHURINE 6.0 04/14/2017 1549   GLUCOSEU NEGATIVE 04/14/2017 1549   HGBUR NEGATIVE 04/14/2017 1549   BILIRUBINUR NEGATIVE 04/14/2017 1549   KETONESUR NEGATIVE 04/14/2017 1549   PROTEINUR NEGATIVE 04/14/2017 1549   NITRITE NEGATIVE 04/14/2017 1549   LEUKOCYTESUR NEGATIVE 04/14/2017 1549    Sepsis Labs: @LABRCNTIP (procalcitonin:4,lacticidven:4) )No results found for this or any previous visit (from the past 240 hour(s)).   Radiological Exams on Admission: Dg Chest Port 1 View  Result Date: 04/20/2017 CLINICAL DATA:  Patient with continued weakness, nausea and vomiting. EXAM: PORTABLE CHEST 1 VIEW COMPARISON:  Chest radiograph 04/14/2017 FINDINGS: Monitoring leads overlie the patient. Normal cardiac and mediastinal contours. Grossly unchanged coarse interstitial pulmonary opacities. Biapical pleuroparenchymal thickening. No pleural effusion or pneumothorax. IMPRESSION: No acute cardiopulmonary process. Chronic interstitial pulmonary opacities. Electronically Signed   By: Lovey Newcomer M.D.   On: 04/20/2017 11:14    EKG: (Independently reviewed) Sinus bradycardia with ventricular rate 39 bpm, QTC 497 ms, T-wave inversion V2 through V5 with subtle downsloping likely rate related although ischemia cannot be completely excluded since there's questionable similar T-wave inversion with downsloping ST segment in lead 1  Assessment/Plan Principal Problem:   Hypotension/?? Sepsis -Patient presents with altered mental status, hypotension, bradycardia, mild elevation AST and elevated lactic acid without source of infection likely related to profound dehydration in setting of intractable nausea and vomiting but sepsis unable to be excluded at time of admission -Initiate adult sepsis focused orders -Continue broad-spectrum empiric antibiotics -Has completed at 30 mL/kg fluid challenge based on lactic acid greater than 4.0 -Continue to cycle lactic acid -Procalcitonin -Serum cortisol -Follow up on blood and urine culture-obtain urinalysis -Insert Foley catheter for accurate urinary output -Give additional 500 mL normal saline bolus and continue IV fluids at 150 mL/hr -Okay to use pressors if indicated for short-term use  Active Problems:   Severe dehydration/Intractable nausea and  vomiting -Underwent outpatient EGD on 7/3 with Dr. Benson Norway with no findings to explain patient's current symptoms -Complete abdominal ultrasound on 7/5 without findings that would explain patient's symptoms -Continue supportive care as above  with IV fluid hydration -Symptom management with anti-emetics -No abdominal distention or other signs consistent with bowel obstruction -Obtain CT abdomen and pelvis without contrast (in setting of AKI) to look for other intra-abdominal pathology that would explain symptoms **There is non-specified stranding around the liver and pancreas-lipase normal and only AST mildly elevated -Patient with marked thrombocytosis likely explained by severe dehydration-follow CBC after hydration    Acute kidney injury  -Has occurred in setting of profound dehydration with associated hypoperfusion and hypotension -Baseline: BUN 17 and creatinine 1.08 -Current: BUN 35 and creatinine 3.09 with decrease in creatinine to 2.80 after 1200 mL IV fluid in ER -Continue to follow labs -Avoid nephrotoxins if possible    Hypothermia -Likely related to hypoperfusion in setting of profound dehydration -Continue warming blanket -Anticipate rebound hypotension once patient becomes normothermic therefore may require short-term pressors -Follow body temperature hourly or per nursing protocol for warming blanket -Follow-up on cortisol as above -TSH    Elevated lactic acid level -Sepsis and differential as above -Likely explained by hypoperfusion related to above stated problems -Continue to cycle lactis acid -Treat underlying causes    Sinus bradycardia -Likely secondary to hypothermia -No evidence of heart block and was not on beta blockers prior to admission -Treat underlying causes -Agree with cardiology consultation -Cycle troponin -For completeness of exam obtain echocardiogram    COPD (chronic obstructive pulmonary disease)/tobacco abuse -Patient has been smoking for  greater than 25 years and only quit 4 weeks ago when she developed GI symptoms as described above -Has 4 mm subpleural left lower lobe nodule which is stable based on most recent CT of the chest March 2018 -Given presentation no explanation for patient's symptoms will obtain CT of the chest to ensure no acute changes since this most recent CT -Continue preadmission MDI -Flutter valve    Elevated troponin -Likely reflective of demand ischemia in setting of hypothermia, profound dehydration and recent hypotension -Until ischemia rule out as precaution EDP has requested heparin infusion and we will continue -Continue to cycle troponin -Cardiology consulted as above -EKG has some new changes that could be related to ischemia although likely related to demand ischemia; patient apparently with 6 months of dyspnea on exertion as well    HLD (hyperlipidemia) -Hold preadmission statin    Carotid stenosis, bilateral -Carotid Dopplers in 2014 with bilateral 40-59% stenosis -Currently asymptomatic      DVT prophylaxis: Heparin infusion Code Status: DO NOT RESUSCITATE but okay with pressors and BiPAP Family Communication: Husband Disposition Plan: Home Consults called: Cardiology/[place]; PCCM    Cormick Moss L. ANP-BC [facility] Pager [phone]   If 7PM-7AM, please contact night-coverage [email or web address] Password [email or web address]  04/20/2017, 1:23 PM

## 2017-04-20 NOTE — ED Notes (Signed)
Admitting PA verbalized the pt is appropriate for 4E Stepdown, Cardiologist speaking with pt at this time

## 2017-04-20 NOTE — ED Notes (Signed)
Pt placed on zoll pads due to heart rate in the 30's

## 2017-04-20 NOTE — ED Notes (Signed)
Cardiology at bedside.

## 2017-04-21 ENCOUNTER — Inpatient Hospital Stay (HOSPITAL_COMMUNITY): Payer: Medicare HMO

## 2017-04-21 ENCOUNTER — Other Ambulatory Visit: Payer: Self-pay

## 2017-04-21 DIAGNOSIS — I959 Hypotension, unspecified: Secondary | ICD-10-CM

## 2017-04-21 DIAGNOSIS — R112 Nausea with vomiting, unspecified: Secondary | ICD-10-CM

## 2017-04-21 DIAGNOSIS — R7989 Other specified abnormal findings of blood chemistry: Secondary | ICD-10-CM

## 2017-04-21 DIAGNOSIS — I214 Non-ST elevation (NSTEMI) myocardial infarction: Secondary | ICD-10-CM

## 2017-04-21 DIAGNOSIS — J690 Pneumonitis due to inhalation of food and vomit: Secondary | ICD-10-CM

## 2017-04-21 DIAGNOSIS — J9601 Acute respiratory failure with hypoxia: Secondary | ICD-10-CM

## 2017-04-21 DIAGNOSIS — R6521 Severe sepsis with septic shock: Secondary | ICD-10-CM

## 2017-04-21 DIAGNOSIS — A419 Sepsis, unspecified organism: Principal | ICD-10-CM

## 2017-04-21 DIAGNOSIS — N179 Acute kidney failure, unspecified: Secondary | ICD-10-CM

## 2017-04-21 DIAGNOSIS — R9431 Abnormal electrocardiogram [ECG] [EKG]: Secondary | ICD-10-CM

## 2017-04-21 DIAGNOSIS — R6520 Severe sepsis without septic shock: Secondary | ICD-10-CM

## 2017-04-21 LAB — COMPREHENSIVE METABOLIC PANEL
ALT: 54 U/L (ref 14–54)
ALT: 71 U/L — AB (ref 14–54)
AST: 142 U/L — ABNORMAL HIGH (ref 15–41)
AST: 195 U/L — AB (ref 15–41)
Albumin: 1.7 g/dL — ABNORMAL LOW (ref 3.5–5.0)
Albumin: 1.8 g/dL — ABNORMAL LOW (ref 3.5–5.0)
Alkaline Phosphatase: 168 U/L — ABNORMAL HIGH (ref 38–126)
Alkaline Phosphatase: 142 U/L — ABNORMAL HIGH (ref 38–126)
Anion gap: 10 (ref 5–15)
Anion gap: 18 — ABNORMAL HIGH (ref 5–15)
BILIRUBIN TOTAL: 0.7 mg/dL (ref 0.3–1.2)
BILIRUBIN TOTAL: 0.5 mg/dL (ref 0.3–1.2)
BUN: 31 mg/dL — ABNORMAL HIGH (ref 6–20)
BUN: 32 mg/dL — AB (ref 6–20)
CALCIUM: 5.3 mg/dL — AB (ref 8.9–10.3)
CHLORIDE: 108 mmol/L (ref 101–111)
CO2: 18 mmol/L — ABNORMAL LOW (ref 22–32)
CO2: 18 mmol/L — ABNORMAL LOW (ref 22–32)
CREATININE: 2.55 mg/dL — AB (ref 0.44–1.00)
Calcium: 5.7 mg/dL — CL (ref 8.9–10.3)
Chloride: 108 mmol/L (ref 101–111)
Creatinine, Ser: 2.64 mg/dL — ABNORMAL HIGH (ref 0.44–1.00)
GFR calc non Af Amer: 17 mL/min — ABNORMAL LOW (ref >60)
GFR, EST AFRICAN AMERICAN: 20 mL/min — AB (ref >60)
GFR, EST AFRICAN AMERICAN: 20 mL/min — AB (ref >60)
GFR, EST NON AFRICAN AMERICAN: 18 mL/min — AB (ref >60)
Glucose, Bld: 146 mg/dL — ABNORMAL HIGH (ref 65–99)
Glucose, Bld: 163 mg/dL — ABNORMAL HIGH (ref 65–99)
POTASSIUM: 4.1 mmol/L (ref 3.5–5.1)
Potassium: 3.1 mmol/L — ABNORMAL LOW (ref 3.5–5.1)
Sodium: 136 mmol/L (ref 135–145)
Sodium: 144 mmol/L (ref 135–145)
TOTAL PROTEIN: 5.1 g/dL — AB (ref 6.5–8.1)
TOTAL PROTEIN: 4.6 g/dL — AB (ref 6.5–8.1)

## 2017-04-21 LAB — CBC
HEMATOCRIT: 27.8 % — AB (ref 36.0–46.0)
HEMATOCRIT: 26.1 % — AB (ref 36.0–46.0)
Hemoglobin: 9.1 g/dL — ABNORMAL LOW (ref 12.0–15.0)
Hemoglobin: 8.4 g/dL — ABNORMAL LOW (ref 12.0–15.0)
MCH: 26.8 pg (ref 26.0–34.0)
MCH: 27.1 pg (ref 26.0–34.0)
MCHC: 32 g/dL (ref 30.0–36.0)
MCHC: 32.2 g/dL (ref 30.0–36.0)
MCV: 83.7 fL (ref 78.0–100.0)
MCV: 84.2 fL (ref 78.0–100.0)
PLATELETS: 624 10*3/uL — AB (ref 150–400)
Platelets: 636 10*3/uL — ABNORMAL HIGH (ref 150–400)
RBC: 3.32 MIL/uL — AB (ref 3.87–5.11)
RBC: 3.1 MIL/uL — AB (ref 3.87–5.11)
RDW: 14.1 % (ref 11.5–15.5)
RDW: 14.2 % (ref 11.5–15.5)
WBC: 20.1 10*3/uL — AB (ref 4.0–10.5)
WBC: 22.2 10*3/uL — AB (ref 4.0–10.5)

## 2017-04-21 LAB — ECHOCARDIOGRAM COMPLETE
CHL CUP MV DEC (S): 156
CHL CUP STROKE VOLUME: 17 mL
EWDT: 156 ms
FS: 16 % — AB (ref 28–44)
Height: 60 in
IV/PV OW: 0.87
LA diam end sys: 32 mm
LA diam index: 2.27 cm/m2
LASIZE: 32 mm
LAVOL: 34.8 mL
LAVOLA4C: 30.3 mL
LAVOLIN: 24.7 mL/m2
LDCA: 2.54 cm2
LV PW d: 7.88 mm — AB (ref 0.6–1.1)
LV sys vol: 28 mL (ref 14–42)
LVDIAVOL: 44 mL — AB (ref 46–106)
LVDIAVOLIN: 31 mL/m2
LVOT SV: 29 mL
LVOT VTI: 11.5 cm
LVOTD: 18 mm
LVOTPV: 65 cm/s
LVSYSVOLIN: 20 mL/m2
MV Peak grad: 5 mmHg
MV pk A vel: 103 m/s
MV pk E vel: 110 m/s
Simpson's disk: 38
TAPSE: 14.3 mm
Weight: 1656 oz

## 2017-04-21 LAB — COOXEMETRY PANEL
Carboxyhemoglobin: 1 % (ref 0.5–1.5)
Carboxyhemoglobin: 0.3 % — ABNORMAL LOW (ref 0.5–1.5)
Carboxyhemoglobin: 0.7 % (ref 0.5–1.5)
METHEMOGLOBIN: 1 % (ref 0.0–1.5)
Methemoglobin: 0.8 % (ref 0.0–1.5)
Methemoglobin: 1.2 % (ref 0.0–1.5)
O2 SAT: 59.3 %
O2 SAT: 69.3 %
O2 Saturation: 64.5 %
TOTAL HEMOGLOBIN: 8.8 g/dL — AB (ref 12.0–16.0)
TOTAL HEMOGLOBIN: 13.4 g/dL (ref 12.0–16.0)
TOTAL HEMOGLOBIN: 8.4 g/dL — AB (ref 12.0–16.0)

## 2017-04-21 LAB — HEPARIN LEVEL (UNFRACTIONATED)
HEPARIN UNFRACTIONATED: 0.14 [IU]/mL — AB (ref 0.30–0.70)
Heparin Unfractionated: <0.1 IU/mL — ABNORMAL LOW (ref 0.30–0.70)

## 2017-04-21 LAB — LACTIC ACID, PLASMA
Lactic Acid, Venous: 9.7 mmol/L (ref 0.5–1.9)
Lactic Acid, Venous: 6.4 mmol/L (ref 0.5–1.9)

## 2017-04-21 LAB — TROPONIN I: TROPONIN I: 3.82 ng/mL — AB (ref <0.03)

## 2017-04-21 LAB — BLOOD GAS, ARTERIAL
ACID-BASE DEFICIT: 16.2 mmol/L — AB (ref 0.0–2.0)
BICARBONATE: 9 mmol/L — AB (ref 20.0–28.0)
DRAWN BY: 441371
O2 Content: 4 L/min
O2 Saturation: 97 %
PATIENT TEMPERATURE: 96.3
pH, Arterial: 7.331 — ABNORMAL LOW (ref 7.350–7.450)
pO2, Arterial: 121 mmHg — ABNORMAL HIGH (ref 83.0–108.0)

## 2017-04-21 LAB — GLUCOSE, CAPILLARY
Glucose-Capillary: 148 mg/dL — ABNORMAL HIGH (ref 65–99)
Glucose-Capillary: 143 mg/dL — ABNORMAL HIGH (ref 65–99)

## 2017-04-21 LAB — BRAIN NATRIURETIC PEPTIDE: B Natriuretic Peptide: 1528.1 pg/mL — ABNORMAL HIGH (ref 0.0–100.0)

## 2017-04-21 MED ORDER — SODIUM CHLORIDE 0.9% FLUSH: 10.0000 mL | INTRAVENOUS | Status: DC | PRN

## 2017-04-21 MED ORDER — ORAL CARE MOUTH RINSE
15.0000 mL | Freq: Two times a day (BID) | OROMUCOSAL | Status: DC
  Administered 2017-04-21: 15 mL via OROMUCOSAL

## 2017-04-21 MED ORDER — SODIUM BICARBONATE 8.4 % IV SOLN
INTRAVENOUS | Status: AC
  Administered 2017-04-21: 50 meq
  Filled 2017-04-21: qty 100

## 2017-04-21 MED ORDER — MIDAZOLAM HCL 2 MG/2ML IJ SOLN
INTRAMUSCULAR | Status: AC
  Administered 2017-04-21: 2 mg
  Filled 2017-04-21: qty 2

## 2017-04-21 MED ORDER — FENTANYL BOLUS VIA INFUSION
50.0000 ug | INTRAVENOUS | Status: DC | PRN
  Administered 2017-04-22: 50 ug via INTRAVENOUS
  Filled 2017-04-21: qty 50

## 2017-04-21 MED ORDER — FENTANYL CITRATE (PF) 100 MCG/2ML IJ SOLN: 50.0000 ug | Freq: Once | INTRAMUSCULAR | Status: DC

## 2017-04-21 MED ORDER — BUDESONIDE 0.5 MG/2ML IN SUSP
0.5000 mg | Freq: Two times a day (BID) | RESPIRATORY_TRACT | Status: DC
  Administered 2017-04-21 – 2017-05-09 (×35): 0.5 mg via RESPIRATORY_TRACT
  Filled 2017-04-21 (×37): qty 2

## 2017-04-21 MED ORDER — CHLORHEXIDINE GLUCONATE CLOTH 2 % EX PADS
6.0000 | MEDICATED_PAD | Freq: Every day | CUTANEOUS | Status: DC
Start: 2017-04-22 — End: 2017-05-09
  Administered 2017-04-22 – 2017-05-09 (×15): 6 via TOPICAL

## 2017-04-21 MED ORDER — FAMOTIDINE 40 MG/5ML PO SUSR: 20.0000 mg | Freq: Two times a day (BID) | ORAL | Status: DC

## 2017-04-21 MED ORDER — CHLORHEXIDINE GLUCONATE 0.12 % MT SOLN: 15.0000 mL | Freq: Two times a day (BID) | OROMUCOSAL | Status: DC

## 2017-04-21 MED ORDER — DOBUTAMINE IN D5W 4-5 MG/ML-% IV SOLN
5.0000 ug/kg/min | INTRAVENOUS | Status: DC
  Administered 2017-04-21: 2.5 ug/kg/min via INTRAVENOUS
  Administered 2017-04-24: 5 ug/kg/min via INTRAVENOUS
  Filled 2017-04-21 (×3): qty 250

## 2017-04-21 MED ORDER — PANTOPRAZOLE SODIUM 40 MG IV SOLR
40.0000 mg | Freq: Two times a day (BID) | INTRAVENOUS | Status: DC
  Administered 2017-04-21 – 2017-04-30 (×19): 40 mg via INTRAVENOUS
  Filled 2017-04-21 (×21): qty 40

## 2017-04-21 MED ORDER — SODIUM BICARBONATE 8.4 % IV SOLN
50.0000 meq | Freq: Once | INTRAVENOUS | Status: AC
  Administered 2017-04-21: 50 meq via INTRAVENOUS

## 2017-04-21 MED ORDER — ROCURONIUM BROMIDE 50 MG/5ML IV SOLN
40.0000 mg | Freq: Once | INTRAVENOUS | Status: AC
  Administered 2017-04-21: 40 mg via INTRAVENOUS

## 2017-04-21 MED ORDER — ARFORMOTEROL TARTRATE 15 MCG/2ML IN NEBU
15.0000 ug | INHALATION_SOLUTION | Freq: Two times a day (BID) | RESPIRATORY_TRACT | Status: DC
  Administered 2017-04-21 – 2017-05-09 (×35): 15 ug via RESPIRATORY_TRACT
  Filled 2017-04-21 (×37): qty 2

## 2017-04-21 MED ORDER — MIDAZOLAM HCL 2 MG/2ML IJ SOLN
1.0000 mg | INTRAMUSCULAR | Status: DC | PRN
  Administered 2017-04-22: 1 mg via INTRAVENOUS
  Administered 2017-04-26: 2 mg via INTRAVENOUS
  Filled 2017-04-21 (×2): qty 2

## 2017-04-21 MED ORDER — METHYLPREDNISOLONE SODIUM SUCC 125 MG IJ SOLR
60.0000 mg | Freq: Two times a day (BID) | INTRAMUSCULAR | Status: DC
  Administered 2017-04-21: 60 mg via INTRAVENOUS

## 2017-04-21 MED ORDER — FENTANYL 2500MCG IN NS 250ML (10MCG/ML) PREMIX INFUSION
25.0000 ug/h | INTRAVENOUS | Status: DC
  Administered 2017-04-21: 50 ug/h via INTRAVENOUS
  Administered 2017-04-22: 125 ug/h via INTRAVENOUS
  Filled 2017-04-21 (×3): qty 250

## 2017-04-21 MED ORDER — SODIUM CHLORIDE 0.9 % IV SOLN
1.0000 g | Freq: Once | INTRAVENOUS | Status: AC
  Administered 2017-04-21: 1 g via INTRAVENOUS
  Filled 2017-04-21: qty 10

## 2017-04-21 MED ORDER — IPRATROPIUM-ALBUTEROL 0.5-2.5 (3) MG/3ML IN SOLN
3.0000 mL | Freq: Four times a day (QID) | RESPIRATORY_TRACT | Status: DC
  Administered 2017-04-21 – 2017-04-24 (×14): 3 mL via RESPIRATORY_TRACT
  Filled 2017-04-21 (×14): qty 3

## 2017-04-21 MED ORDER — SODIUM CHLORIDE 0.9% FLUSH
10.0000 mL | Freq: Two times a day (BID) | INTRAVENOUS | Status: DC
  Administered 2017-04-21 – 2017-05-01 (×16): 10 mL
  Administered 2017-05-01: 20 mL
  Administered 2017-05-02 – 2017-05-07 (×5): 10 mL

## 2017-04-21 MED ORDER — SODIUM CHLORIDE 0.9 % IV BOLUS (SEPSIS)
500.0000 mL | Freq: Once | INTRAVENOUS | Status: AC
  Administered 2017-04-21: 500 mL via INTRAVENOUS

## 2017-04-21 MED ORDER — ETOMIDATE 2 MG/ML IV SOLN
20.0000 mg | Freq: Once | INTRAVENOUS | Status: AC
  Administered 2017-04-21: 20 mg via INTRAVENOUS

## 2017-04-21 MED ORDER — FENTANYL CITRATE (PF) 100 MCG/2ML IJ SOLN
INTRAMUSCULAR | Status: AC
  Administered 2017-04-21: 75 ug
  Filled 2017-04-21: qty 2

## 2017-04-21 MED ORDER — DOCUSATE SODIUM 50 MG/5ML PO LIQD: 100.0000 mg | Freq: Two times a day (BID) | ORAL | Status: DC | PRN

## 2017-04-21 MED ORDER — ATROPINE SULFATE 1 MG/10ML IJ SOSY
PREFILLED_SYRINGE | INTRAMUSCULAR | Status: AC
  Administered 2017-04-21: 1 mg
  Filled 2017-04-21: qty 10

## 2017-04-21 MED ORDER — ORAL CARE MOUTH RINSE
15.0000 mL | Freq: Four times a day (QID) | OROMUCOSAL | Status: DC
  Administered 2017-04-21 – 2017-04-25 (×14): 15 mL via OROMUCOSAL

## 2017-04-21 MED ORDER — MIDAZOLAM HCL 2 MG/2ML IJ SOLN
1.0000 mg | INTRAMUSCULAR | Status: AC | PRN
  Administered 2017-04-21 – 2017-04-22 (×3): 1 mg via INTRAVENOUS
  Filled 2017-04-21 (×3): qty 2

## 2017-04-21 MED ORDER — CHLORHEXIDINE GLUCONATE 0.12% ORAL RINSE (MEDLINE KIT)
15.0000 mL | Freq: Two times a day (BID) | OROMUCOSAL | Status: DC
  Administered 2017-04-21 – 2017-04-25 (×9): 15 mL via OROMUCOSAL

## 2017-04-21 MED ORDER — METHYLPREDNISOLONE SODIUM SUCC 125 MG IJ SOLR
INTRAMUSCULAR | Status: AC
Start: 2017-04-21 — End: 2017-04-21
  Filled 2017-04-21: qty 2

## 2017-04-21 MED ORDER — NOREPINEPHRINE BITARTRATE 1 MG/ML IV SOLN
0.0000 ug/min | INTRAVENOUS | Status: DC
  Administered 2017-04-21: 10 ug/min via INTRAVENOUS
  Administered 2017-04-22: 6 ug/min via INTRAVENOUS
  Administered 2017-04-22 – 2017-04-23 (×2): 4 ug/min via INTRAVENOUS
  Administered 2017-04-23: 11 ug/min via INTRAVENOUS
  Administered 2017-04-24: 10 ug/min via INTRAVENOUS
  Administered 2017-04-24: 7 ug/min via INTRAVENOUS
  Filled 2017-04-21 (×10): qty 4

## 2017-04-21 MED ORDER — ALBUMIN HUMAN 25 % IV SOLN
12.5000 g | Freq: Once | INTRAVENOUS | Status: AC
  Administered 2017-04-21: 12.5 g via INTRAVENOUS
  Filled 2017-04-21: qty 50

## 2017-04-21 NOTE — Progress Notes (Signed)
Pt brady to 55, MD notified and came to bedside. Rapid Response called. Atropine 1mg  given, 2L NS bolus given, heparin on hold per MD. Levophed started by rapid response RN, EKG completed. Spouse called and at bedside. Will transfer pt to ICU.

## 2017-04-21 NOTE — H&P (Addendum)
[facility] PROGRESS NOTE    Progress Note  Alexandra House  LEX:[id number] DOB: 27-Apr-1946 DOA: 04/20/2017 PCP: Marton Redwood, MD     Brief Narrative:   Alexandra House is an 71 y.o. female past medical history of COPD, osteoporosis and history of syncope though relates 4 weeks of intractable nausea vomiting without pain she had an EGD and abdominal ultrasound which were unremarkable. She relates she's lost about 10 pounds over the last 4 weeks for nausea and vomiting. She was she's also had dyspnea with productive cough in the ED she was found to be hypothermic tachypneic bradycardic in the 30s with a blood pressure initially of 79/39 and a map of 48 she was not however hypoxemic, but she was also in acute renal failure with lactic acid 6.3 white count of 27 and a platelet count of 700,000, cardiac biomarkers were 2.4. She was started on the sepsis pathway with IV empiric antibiotics fluid resuscitation and critical care was consulted and recommended management in the stepdown.  Today when I went and asked the nurse called me as she was significantly bradycardic her blood pressure was in the 80s after 5 L of normal saline bolus, coughing and complaining of shortness of breath on physical exam she had a JVD with crackles in her lungs bilaterally. We ordered Lactic acid , Prcalcitonin and chest x-ray, she was started on the third peripherally as she continued to be bradycardic after several doses of atropine, her heart rate was in the 40s with a blood pressure of 80/70. Critical care was called and they recommended transfer to the floor. She was also given IV Solu-Medrol. She also has new acute renal failure baseline creatinine less than 1 on admission it was 2.8  Assessment/Plan:   Severe Septic shock with sepsis ([place]) due to CAP: CT chest showed Bilateral pulmonary infiltrate, Hypothermic with a white count of 27,000, platelet count is 700,000 lactic acid on admission of 6.8, which improved  to 3.8 after 5 L of normal saline, new acute renal failure , anion gap metabolic acidosis. She has gotten 5.0L of NS, now her blood pressure is lower than on admission in BP and is now bradycardic, repeated lactic acid is pending. She relates she cont to have Nausea and vomting with SOB. She is on empiric Vanc and zosyn, and a stress dose steroids. Will start levophed peripherally. I had a long discussion with the patient and her husband and they relate she would want to be occluded.  Acute respiratory failure with hypoxia due to community-acquired pneumonia and probable COPD: On physical exam she's not wheezing, she was started empirically on IV vancomycin and Zosyn. Continue inhalers. Repeated chest x-ray as she has JVD with crackles at bases, also get an ABG.  AKI: In the setting of sepsis.Baseline creatinine less than 1.0 on admission 2.8 Cont IV fluid hydration and start levophed.  Severe dehydration/Intractable nausea and vomiting Multifactorial in the setting of hypotension and pneumonia continue supportive care with Zofran and Phenergan when necessary.  Hypothermia: In the setting of sepsis This is improved.  Sinus bradycardia/elevated troponins: I think is likely due to sepsis etiology no improvement IV fluid or atropine, did improve after fluid was started. Cardiology was consulted, totally EKG showed no evidence of heart block patient is not on aspirin prior to admission. 2-D echo is pending. Cardiology does not suspect acute coronary syndrome as a primary event elevation in troponins can probably be due to acute renal failure in the setting of  sepsis. 2-D echoes is pending at this point.  Elevated lactic acid level Due to sepsis initially responds to IV fluids on admission was 6.8, after IV fluid hydration is 3.8.   DVT prophylaxis: heparin Family Communication:husband and son Disposition Plan/Barrier to D/C: unable to determine Code Status: Full code       IV  Access:    Peripheral IV   Procedures and diagnostic studies:   Ct Abdomen Pelvis Wo Contrast  Result Date: 04/20/2017 CLINICAL DATA:  Patient with weakness, nausea and vomiting. EXAM: CT CHEST, ABDOMEN AND PELVIS WITHOUT CONTRAST TECHNIQUE: Multidetector CT imaging of the chest, abdomen and pelvis was performed following the standard protocol without IV contrast. COMPARISON:  CT chest 01/07/2017 FINDINGS: CT CHEST FINDINGS Cardiovascular: Normal heart size. No pericardial effusion. Aortic atherosclerosis. Coronary arterial atherosclerosis. Mediastinum/Nodes: Interval development of mediastinal adenopathy including a 1.1 cm subcarinal lymph node (image 27; series 3) and a 1.0 cm prevascular lymph node. Additionally there is new fullness within the hila bilaterally most suggestive of hilar adenopathy, poorly visualized without IV contrast. No axillary adenopathy. Small hiatal hernia. Lungs/Pleura: Centrilobular and paraseptal emphysematous changes. Interval development of patchy irregular consolidative opacities throughout the lungs bilaterally with a somewhat basilar predominance. Additionally there is new peribronchial thickening. No pleural effusion or pneumothorax. Musculoskeletal: No aggressive or acute appearing osseous lesions. CT ABDOMEN PELVIS FINDINGS Hepatobiliary: Liver is diffusely low in attenuation compatible with steatosis. Additionally there is suggestion of mild gallbladder wall thickening measuring up to 7 mm (image 66; series 3). There is fat stranding within the porta hepatis and about the pancreas. Small amount of fluid inferior to the right hepatic lobe. Pancreas: Mild parenchymal atrophy. Small amount of surrounding fat stranding. Spleen: Unremarkable Adrenals/Urinary Tract: Adrenal glands are normal. Kidneys are symmetric in size. No hydronephrosis. Urinary bladder is unremarkable. Stomach/Bowel: No abnormal bowel wall thickening or evidence for bowel obstruction. Decompressed  sigmoid colon. Sigmoid colonic diverticulosis without evidence for acute diverticulitis. No free intraperitoneal air. Vascular/Lymphatic: Normal caliber abdominal aorta. No retroperitoneal lymphadenopathy. Reproductive: Uterus and adnexal structures are unremarkable. Other: None. Musculoskeletal: No aggressive or acute appearing osseous lesions. IMPRESSION: Interval development of irregular patchy consolidative opacities throughout the lungs bilaterally concerning for the possibility of multifocal infection. There is fat stranding within the porta hepatis and about the pancreatic parenchyma. Findings are nonspecific in etiology and may be reactive. Sequelae of acalculous cholecystitis, pancreatitis or potentially hepatitis are not excluded. Recommend clinical and laboratory correlation. Hepatic steatosis. These results will be called to the ordering clinician or representative by the Radiologist Assistant, and communication documented in the PACS or zVision Dashboard. Electronically Signed   By: Lovey Newcomer M.D.   On: 04/20/2017 16:03   Ct Chest Wo Contrast  Result Date: 04/20/2017 CLINICAL DATA:  Patient with weakness, nausea and vomiting. EXAM: CT CHEST, ABDOMEN AND PELVIS WITHOUT CONTRAST TECHNIQUE: Multidetector CT imaging of the chest, abdomen and pelvis was performed following the standard protocol without IV contrast. COMPARISON:  CT chest 01/07/2017 FINDINGS: CT CHEST FINDINGS Cardiovascular: Normal heart size. No pericardial effusion. Aortic atherosclerosis. Coronary arterial atherosclerosis. Mediastinum/Nodes: Interval development of mediastinal adenopathy including a 1.1 cm subcarinal lymph node (image 27; series 3) and a 1.0 cm prevascular lymph node. Additionally there is new fullness within the hila bilaterally most suggestive of hilar adenopathy, poorly visualized without IV contrast. No axillary adenopathy. Small hiatal hernia. Lungs/Pleura: Centrilobular and paraseptal emphysematous changes.  Interval development of patchy irregular consolidative opacities throughout the lungs bilaterally with a somewhat basilar predominance.  Additionally there is new peribronchial thickening. No pleural effusion or pneumothorax. Musculoskeletal: No aggressive or acute appearing osseous lesions. CT ABDOMEN PELVIS FINDINGS Hepatobiliary: Liver is diffusely low in attenuation compatible with steatosis. Additionally there is suggestion of mild gallbladder wall thickening measuring up to 7 mm (image 66; series 3). There is fat stranding within the porta hepatis and about the pancreas. Small amount of fluid inferior to the right hepatic lobe. Pancreas: Mild parenchymal atrophy. Small amount of surrounding fat stranding. Spleen: Unremarkable Adrenals/Urinary Tract: Adrenal glands are normal. Kidneys are symmetric in size. No hydronephrosis. Urinary bladder is unremarkable. Stomach/Bowel: No abnormal bowel wall thickening or evidence for bowel obstruction. Decompressed sigmoid colon. Sigmoid colonic diverticulosis without evidence for acute diverticulitis. No free intraperitoneal air. Vascular/Lymphatic: Normal caliber abdominal aorta. No retroperitoneal lymphadenopathy. Reproductive: Uterus and adnexal structures are unremarkable. Other: None. Musculoskeletal: No aggressive or acute appearing osseous lesions. IMPRESSION: Interval development of irregular patchy consolidative opacities throughout the lungs bilaterally concerning for the possibility of multifocal infection. There is fat stranding within the porta hepatis and about the pancreatic parenchyma. Findings are nonspecific in etiology and may be reactive. Sequelae of acalculous cholecystitis, pancreatitis or potentially hepatitis are not excluded. Recommend clinical and laboratory correlation. Hepatic steatosis. These results will be called to the ordering clinician or representative by the Radiologist Assistant, and communication documented in the PACS or zVision  Dashboard. Electronically Signed   By: Lovey Newcomer M.D.   On: 04/20/2017 16:03   Dg Chest Port 1 View  Result Date: 04/20/2017 CLINICAL DATA:  Patient with continued weakness, nausea and vomiting. EXAM: PORTABLE CHEST 1 VIEW COMPARISON:  Chest radiograph 04/14/2017 FINDINGS: Monitoring leads overlie the patient. Normal cardiac and mediastinal contours. Grossly unchanged coarse interstitial pulmonary opacities. Biapical pleuroparenchymal thickening. No pleural effusion or pneumothorax. IMPRESSION: No acute cardiopulmonary process. Chronic interstitial pulmonary opacities. Electronically Signed   By: Lovey Newcomer M.D.   On: 04/20/2017 11:14     Medical Consultants:    None.  Anti-Infectives:   IV vancomycin and Zosyn.  Subjective:    [name] she will initiate does not feel better than yesterday, she still short of breath with productive cough and dizzy. She continues to have nausea which has not improved.  Objective:    Vitals:   04/21/17 1110 04/21/17 1115 04/21/17 1118 04/21/17 1120  BP: (!) 80/50 (!) 80/55 94/64 100/60  Pulse: (!) 48 (!) 59 (!) 52 (!) 53  Resp: (!) 24 19 19 19   Temp:      TempSrc:      SpO2: 96% 100% 100% 100%  Weight:      Height:        Intake/Output Summary (Last 24 hours) at 04/21/17 1154 Last data filed at 04/21/17 0927  Gross per 24 hour  Intake          4185.65 ml  Output               90 ml  Net          4095.65 ml   Filed Weights   04/20/17 1031 04/20/17 1537 04/21/17 0405  Weight: 37.2 kg (82 lb) 42.8 kg (94 lb 6.4 oz) 46.9 kg (103 lb 8 oz)    Exam: General exam: Acute distress mildly confused and restless. Respiratory system: Good air movement with crackles at bases bilaterally. Cardiovascular system: Bradycardic with regular rate with positive JVD no murmurs Gastrointestinal system: Abdomen is soft nontender nondistended Central nervous system: He is awake alert and oriented  3 but at times in distress. Extremities without any  difficulties extraocular movements are intact Extremities: No lower extremity edema. Skin: No rashes. Psychiatry: Judgment and insight appeared normal, mood and affect are appropriate.   Data Reviewed:    Labs: Basic Metabolic Panel:  Recent Labs Lab 04/14/17 1433 04/20/17 1050 04/20/17 1156 04/20/17 1340 04/20/17 2004 04/21/17 0138  NA 131* 135 137  --   --  136  K 3.9 4.9 4.8  --   --  4.1  CL 103 101 102  --   --  108  CO2 21* 18*  --   --   --  18*  GLUCOSE 131* 140* 132*  --   --  146*  BUN 17 35* 36*  --   --  31*  CREATININE 1.08* 3.09* 2.80*  --   --  2.64*  CALCIUM 8.1* 8.1*  --   --   --  5.7*  MG  --   --   --  1.8 1.9  --   PHOS  --   --   --  5.2* 4.7*  --    GFR Estimated Creatinine Clearance: 12.9 mL/min (A) (by C-G formula based on SCr of 2.64 mg/dL (H)). Liver Function Tests:  Recent Labs Lab 04/14/17 1433 04/20/17 1050 04/21/17 0138  AST 46* 113* 142*  ALT 21 48 54  ALKPHOS 148* 238* 168*  BILITOT 0.3 0.4 0.7  PROT 6.8 7.0 5.1*  ALBUMIN 2.5* 2.5* 1.7*    Recent Labs Lab 04/14/17 1433 04/20/17 1050  LIPASE 20 19   No results for input(s): AMMONIA in the last 168 hours. Coagulation profile  Recent Labs Lab 04/20/17 1050  INR 1.10    CBC:  Recent Labs Lab 04/14/17 1433 04/20/17 1050 04/20/17 1156 04/21/17 0138  WBC 14.6* 27.7*  --  20.1*  NEUTROABS  --  21.6*  --   --   HGB 11.1* 10.9* 13.6 9.1*  HCT 33.1* 34.0* 40.0 27.8*  MCV 84.7 85.9  --  83.7  PLT 521* 709*  --  624*   Cardiac Enzymes:  Recent Labs Lab 04/20/17 1340 04/20/17 2004 04/21/17 0138  TROPONINI 2.02* 2.08* 3.82*   BNP (last 3 results) No results for input(s): PROBNP in the last 8760 hours. CBG: No results for input(s): GLUCAP in the last 168 hours. D-Dimer: No results for input(s): DDIMER in the last 72 hours. Hgb A1c: No results for input(s): HGBA1C in the last 72 hours. Lipid Profile: No results for input(s): CHOL, HDL, LDLCALC, TRIG,  CHOLHDL, LDLDIRECT in the last 72 hours. Thyroid function studies:  Recent Labs  04/20/17 1340  TSH 4.801*   Anemia work up: No results for input(s): VITAMINB12, FOLATE, FERRITIN, TIBC, IRON, RETICCTPCT in the last 72 hours. Sepsis Labs:  Recent Labs Lab 04/14/17 1433 04/14/17 1449 04/20/17 1050 04/20/17 1119 04/20/17 1340 04/20/17 1353 04/21/17 0138  PROCALCITON  --   --   --   --  1.07  --   --   WBC 14.6*  --  27.7*  --   --   --  20.1*  LATICACIDVEN  --  1.03  --  6.39*  --  3.80*  --    Microbiology Recent Results (from the past 240 hour(s))  Culture, blood (Routine x 2)     Status: None (Preliminary result)   Collection Time: 04/20/17 11:07 AM  Result Value Ref Range Status   Specimen Description BLOOD LEFT WRIST  Final   Special Requests  Final    BOTTLES DRAWN AEROBIC ONLY Blood Culture results may not be optimal due to an inadequate volume of blood received in culture bottles   Culture NO GROWTH < 24 HOURS  Final   Report Status PENDING  Incomplete  Culture, blood (Routine x 2)     Status: None (Preliminary result)   Collection Time: 04/20/17  1:40 PM  Result Value Ref Range Status   Specimen Description BLOOD RIGHT HAND  Final   Special Requests   Final    BOTTLES DRAWN AEROBIC AND ANAEROBIC Blood Culture adequate volume   Culture NO GROWTH < 24 HOURS  Final   Report Status PENDING  Incomplete  MRSA PCR Screening     Status: None   Collection Time: 04/20/17  3:35 PM  Result Value Ref Range Status   MRSA by PCR NEGATIVE NEGATIVE Final    Comment:        The GeneXpert MRSA Assay (FDA approved for NASAL specimens only), is one component of a comprehensive MRSA colonization surveillance program. It is not intended to diagnose MRSA infection nor to guide or monitor treatment for MRSA infections.      Medications:   . fluticasone furoate-vilanterol  1 puff Inhalation Daily  . guaiFENesin  600 mg Oral BID  . methylPREDNISolone (SOLU-MEDROL)  injection  60 mg Intravenous Q12H  . methylPREDNISolone sodium succinate      . mometasone-formoterol  2 puff Inhalation BID  . pantoprazole (PROTONIX) IV  40 mg Intravenous Q12H  . sodium chloride flush  3 mL Intravenous Q12H   Continuous Infusions: . sodium chloride 125 mL/hr (04/21/17 0927)  . heparin Stopped (04/21/17 1116)  . norepinephrine (LEVOPHED) Adult infusion 10 mcg/min (04/21/17 1114)  . piperacillin-tazobactam (ZOSYN)  IV 2.25 g (04/21/17 0609)     LOS: 1 day   Charlynne Cousins  [facility] Pager [phone]  *Please refer to [email or web address], password [email or web address] to get updated schedule on who will round on this patient, as hospitalists switch teams weekly. If 7PM-7AM, please contact night-coverage at [email or web address], password [email or web address] for any overnight needs.  04/21/2017, 11:54 AM

## 2017-04-21 NOTE — Progress Notes (Signed)
Rn notified of pt temp of 97.3 f Orally

## 2017-04-21 NOTE — Progress Notes (Signed)
RN notified of pt temp of 97.4 F orally

## 2017-04-21 NOTE — Procedures (Signed)
Central Venous Catheter Insertion Procedure Note Alexandra House [id number] Jul 31, 1946  Procedure: Insertion of Central Venous Catheter Indications: Assessment of intravascular volume  Procedure Details Consent: Risks of procedure as well as the alternatives and risks of each were explained to the (patient/caregiver).  Consent for procedure obtained. Time Out: Verified patient identification, verified procedure, site/side was marked, verified correct patient position, special equipment/implants available, medications/allergies/relevent history reviewed, required imaging and test results available.  Performed  Maximum sterile technique was used including antiseptics, cap, gloves, gown, hand hygiene, mask and sheet. Skin prep: Chlorhexidine; local anesthetic administered A antimicrobial bonded/coated triple lumen catheter was placed in the left internal jugular vein using the Seldinger technique.  Ultrasound was used to verify the patency of the vein and for real time needle guidance.  Evaluation Blood flow good Complications: No apparent complications Patient did tolerate procedure well. Chest X-ray ordered to verify placement.  CXR: pending.  Alexandra House 04/21/2017, 1:39 PM

## 2017-04-21 NOTE — Progress Notes (Signed)
Lung sounds have crackles, fluid stopped per Dr Olevia Bowens

## 2017-04-21 NOTE — Progress Notes (Signed)
ANTICOAGULATION CONSULT NOTE - Follow Up Consult  Pharmacy Consult for Heparin  Indication: chest pain/ACS  Allergies  Allergen Reactions  . Bee Venom Anaphylaxis and Hives    Patient Measurements: Height: 4\' 11"  (149.9 cm) Weight: 94 lb 6.4 oz (42.8 kg) IBW/kg (Calculated) : 43.2  Vital Signs: Temp: 98.6 F (37 C) (07/07 2339) Temp Source: Rectal (07/07 2339) BP: 98/53 (07/07 2339) Pulse Rate: 47 (07/07 2339)  Labs:  Recent Labs  04/20/17 1050 04/20/17 1156 04/20/17 1340 04/20/17 2004 04/21/17 0138  HGB 10.9* 13.6  --   --  9.1*  HCT 34.0* 40.0  --   --  27.8*  PLT 709*  --   --   --  624*  LABPROT 14.2  --   --   --   --   INR 1.10  --   --   --   --   HEPARINUNFRC  --   --   --  <0.10* 0.14*  CREATININE 3.09* 2.80*  --   --  2.64*  TROPONINI  --   --  2.02* 2.08* 3.82*    Estimated Creatinine Clearance: 12.8 mL/min (A) (by C-G formula based on SCr of 2.64 mg/dL (H)).  Assessment: Heparin for elevated troponin (trending up), heparin level is sub-therapeutic this AM, no issues per RN.   Goal of Therapy:  Heparin level 0.3-0.7 units/ml Monitor platelets by anticoagulation protocol: Yes   Plan:  -Inc heparin to 600 units/hr -1100 HL  Narda Bonds 04/21/2017,3:08 AM

## 2017-04-21 NOTE — Progress Notes (Signed)
PULMONARY / CRITICAL CARE MEDICINE   Name: Alexandra House MRN: [id number] DOB: [birth year]    ADMISSION DATE:  04/20/2017 CONSULTATION DATE:  04/21/17  REFERRING MD:  Dr. Aileen Fass   CHIEF COMPLAINT:  AMS, Hypotension, Hypothermia, Bradycardia   BRIEF SUMMARY:   71 y/o F who was admitted 7/7 with reports of weakness.  Per chart review, the patient reported she had been feeling weak for more than 4 weeks.  She also had been experiencing persistent nausea & vomiting.  She was seen in the ER on June 24th and July 1st for the same.  She reported she had been vomiting 2-3 times per day, has not been able to eat and has lost >10lbs in the last few weeks.  She denied abdominal pain.  No diarrhea but the patient noted dark stool.  She was recently referred to GI as an outpatient.  Abdominal US on 7/5 which was negative for gallstones, hepatic abnormality, normal pancreas & no hydronephrosis.  She also had an EGD but results are not available in EPIC. In addition, she had shortness of breath with a productive cough.    Initial ER evaluation notable for bradycardia in the 30's, hypotension (79/39), tachypnea, hypothermic.  Labs - Na 135, K 4.9, BUN 35 / Sr Cr 3.09, Ionized calcium 1.01, phos 5.2, mag 1.8, alk phos 238, albumin 2.5, lactic acid 6.39, lipase 19, AST 113 / ALT 48, troponin 2.05, WBC 27.7, hgb 10.9 and platelets 624. Initial CXR showed unchanged coarse interstitial opacities.  She was treated with IVF with improvement in BP.  The patient was admitted per [facility] and treated for suspected sepsis.  She developed worsening hypotension, bradycardia, altered mental status and increased work of breathing on 7/8.  PCCM called back for re-evaluation.  SUBJECTIVE:    VITAL SIGNS: BP 134/70 (BP Location: Right Arm)   Pulse (!) 53   Temp (!) 97.3 F (36.3 C) (Oral)   Resp 19   Ht '4\' 11"'  (1.638 m)   Wt 103 lb 8 oz (46.9 kg)   SpO2 100%   BMI 20.90 kg/m   HEMODYNAMICS:    VENTILATOR SETTINGS:     INTAKE / OUTPUT: I/O last 3 completed shifts: In: 4507.7 [I.V.:2907.7; IV Piggyback:1600] Out: 50 [Urine:50]  PHYSICAL EXAMINATION:  General:  In marked respiratory distress HENT: NCAT OP with dry mucus membranes PULM: CTA B, tachypnea, accessory muscle use CV: Bradycardic rhythm, JVD noted GI: minimal bowel sounds, soft, mildly tender to palpation but no guarding or rebound MSK: normal bulk and tone Neuro: anxious, awake, confused   LABS:  BMET  Recent Labs Lab 04/14/17 1433 04/20/17 1050 04/20/17 1156 04/21/17 0138  NA 131* 135 137 136  K 3.9 4.9 4.8 4.1  CL 103 101 102 108  CO2 21* 18*  --  18*  BUN 17 35* 36* 31*  CREATININE 1.08* 3.09* 2.80* 2.64*  GLUCOSE 131* 140* 132* 146*    Electrolytes  Recent Labs Lab 04/14/17 1433 04/20/17 1050 04/20/17 1340 04/20/17 2004 04/21/17 0138  CALCIUM 8.1* 8.1*  --   --  5.7*  MG  --   --  1.8 1.9  --   PHOS  --   --  5.2* 4.7*  --     CBC  Recent Labs Lab 04/14/17 1433 04/20/17 1050 04/20/17 1156 04/21/17 0138  WBC 14.6* 27.7*  --  20.1*  HGB 11.1* 10.9* 13.6 9.1*  HCT 33.1* 34.0* 40.0 27.8*  PLT 521* 709*  --  624*  Coag's  Recent Labs Lab 04/20/17 1050  INR 1.10    Sepsis Markers  Recent Labs Lab 04/14/17 1449 04/20/17 1119 04/20/17 1340 04/20/17 1353  LATICACIDVEN 1.03 6.39*  --  3.80*  PROCALCITON  --   --  1.07  --     ABG No results for input(s): PHART, PCO2ART, PO2ART in the last 168 hours.  Liver Enzymes  Recent Labs Lab 04/14/17 1433 04/20/17 1050 04/21/17 0138  AST 46* 113* 142*  ALT 21 48 54  ALKPHOS 148* 238* 168*  BILITOT 0.3 0.4 0.7  ALBUMIN 2.5* 2.5* 1.7*    Cardiac Enzymes  Recent Labs Lab 04/20/17 1340 04/20/17 2004 04/21/17 0138  TROPONINI 2.02* 2.08* 3.82*    Glucose  Recent Labs Lab 04/21/17 1142  GLUCAP 148*    Imaging Ct Abdomen Pelvis Wo Contrast  Result Date: 04/20/2017 CLINICAL DATA:  Patient with weakness, nausea and vomiting.  EXAM: CT CHEST, ABDOMEN AND PELVIS WITHOUT CONTRAST TECHNIQUE: Multidetector CT imaging of the chest, abdomen and pelvis was performed following the standard protocol without IV contrast. COMPARISON:  CT chest 01/07/2017 FINDINGS: CT CHEST FINDINGS Cardiovascular: Normal heart size. No pericardial effusion. Aortic atherosclerosis. Coronary arterial atherosclerosis. Mediastinum/Nodes: Interval development of mediastinal adenopathy including a 1.1 cm subcarinal lymph node (image 27; series 3) and a 1.0 cm prevascular lymph node. Additionally there is new fullness within the hila bilaterally most suggestive of hilar adenopathy, poorly visualized without IV contrast. No axillary adenopathy. Small hiatal hernia. Lungs/Pleura: Centrilobular and paraseptal emphysematous changes. Interval development of patchy irregular consolidative opacities throughout the lungs bilaterally with a somewhat basilar predominance. Additionally there is new peribronchial thickening. No pleural effusion or pneumothorax. Musculoskeletal: No aggressive or acute appearing osseous lesions. CT ABDOMEN PELVIS FINDINGS Hepatobiliary: Liver is diffusely low in attenuation compatible with steatosis. Additionally there is suggestion of mild gallbladder wall thickening measuring up to 7 mm (image 66; series 3). There is fat stranding within the porta hepatis and about the pancreas. Small amount of fluid inferior to the right hepatic lobe. Pancreas: Mild parenchymal atrophy. Small amount of surrounding fat stranding. Spleen: Unremarkable Adrenals/Urinary Tract: Adrenal glands are normal. Kidneys are symmetric in size. No hydronephrosis. Urinary bladder is unremarkable. Stomach/Bowel: No abnormal bowel wall thickening or evidence for bowel obstruction. Decompressed sigmoid colon. Sigmoid colonic diverticulosis without evidence for acute diverticulitis. No free intraperitoneal air. Vascular/Lymphatic: Normal caliber abdominal aorta. No retroperitoneal  lymphadenopathy. Reproductive: Uterus and adnexal structures are unremarkable. Other: None. Musculoskeletal: No aggressive or acute appearing osseous lesions. IMPRESSION: Interval development of irregular patchy consolidative opacities throughout the lungs bilaterally concerning for the possibility of multifocal infection. There is fat stranding within the porta hepatis and about the pancreatic parenchyma. Findings are nonspecific in etiology and may be reactive. Sequelae of acalculous cholecystitis, pancreatitis or potentially hepatitis are not excluded. Recommend clinical and laboratory correlation. Hepatic steatosis. These results will be called to the ordering clinician or representative by the Radiologist Assistant, and communication documented in the PACS or zVision Dashboard. Electronically Signed   By: Lovey Newcomer M.D.   On: 04/20/2017 16:03   Ct Chest Wo Contrast  Result Date: 04/20/2017 CLINICAL DATA:  Patient with weakness, nausea and vomiting. EXAM: CT CHEST, ABDOMEN AND PELVIS WITHOUT CONTRAST TECHNIQUE: Multidetector CT imaging of the chest, abdomen and pelvis was performed following the standard protocol without IV contrast. COMPARISON:  CT chest 01/07/2017 FINDINGS: CT CHEST FINDINGS Cardiovascular: Normal heart size. No pericardial effusion. Aortic atherosclerosis. Coronary arterial atherosclerosis. Mediastinum/Nodes: Interval development of mediastinal adenopathy  including a 1.1 cm subcarinal lymph node (image 27; series 3) and a 1.0 cm prevascular lymph node. Additionally there is new fullness within the hila bilaterally most suggestive of hilar adenopathy, poorly visualized without IV contrast. No axillary adenopathy. Small hiatal hernia. Lungs/Pleura: Centrilobular and paraseptal emphysematous changes. Interval development of patchy irregular consolidative opacities throughout the lungs bilaterally with a somewhat basilar predominance. Additionally there is new peribronchial thickening. No  pleural effusion or pneumothorax. Musculoskeletal: No aggressive or acute appearing osseous lesions. CT ABDOMEN PELVIS FINDINGS Hepatobiliary: Liver is diffusely low in attenuation compatible with steatosis. Additionally there is suggestion of mild gallbladder wall thickening measuring up to 7 mm (image 66; series 3). There is fat stranding within the porta hepatis and about the pancreas. Small amount of fluid inferior to the right hepatic lobe. Pancreas: Mild parenchymal atrophy. Small amount of surrounding fat stranding. Spleen: Unremarkable Adrenals/Urinary Tract: Adrenal glands are normal. Kidneys are symmetric in size. No hydronephrosis. Urinary bladder is unremarkable. Stomach/Bowel: No abnormal bowel wall thickening or evidence for bowel obstruction. Decompressed sigmoid colon. Sigmoid colonic diverticulosis without evidence for acute diverticulitis. No free intraperitoneal air. Vascular/Lymphatic: Normal caliber abdominal aorta. No retroperitoneal lymphadenopathy. Reproductive: Uterus and adnexal structures are unremarkable. Other: None. Musculoskeletal: No aggressive or acute appearing osseous lesions. IMPRESSION: Interval development of irregular patchy consolidative opacities throughout the lungs bilaterally concerning for the possibility of multifocal infection. There is fat stranding within the porta hepatis and about the pancreatic parenchyma. Findings are nonspecific in etiology and may be reactive. Sequelae of acalculous cholecystitis, pancreatitis or potentially hepatitis are not excluded. Recommend clinical and laboratory correlation. Hepatic steatosis. These results will be called to the ordering clinician or representative by the Radiologist Assistant, and communication documented in the PACS or zVision Dashboard. Electronically Signed   By: Lovey Newcomer M.D.   On: 04/20/2017 16:03   Dg Chest Port 1 View  Result Date: 04/21/2017 CLINICAL DATA:  Shortness of breath.  COPD.  Syncope.  Smoker.  EXAM: PORTABLE CHEST 1 VIEW COMPARISON:  CT 04/20/2017 plain films 04/20/2017. FINDINGS: External pacer projects over the left side of the chest. Midline trachea. Mild cardiomegaly. Biapical pleural thickening. No pleural effusion or pneumothorax. Since the prior plain film, patchy right lower and left perihilar airspace disease is consistent with developing infection. This is superimposed upon hyperinflation and chronic interstitial thickening of COPD/chronic bronchitis. IMPRESSION: Multifocal airspace opacities, most consistent with infection. Appears progressive since the radiograph of 1 day prior but grossly similar to yesterday's CT. Electronically Signed   By: Abigail Miyamoto M.D.   On: 04/21/2017 12:03     STUDIES:  7/05  [place] ABD >> abdominal US on 7/5 which was negative for gallstones, hepatic abnormality, normal pancreas & no hydronephrosis.   7/7 CT chest/abdomen/pelvis> emphysema, mult-lobar pneumonia, fatty liver, fat stranding around head of pancreas  CULTURES: BCx2 7/7 >>  UC 7/7 >>  resp culture 7/8 >   ANTIBIOTICS: Zosyn 7/7 >>  Vanco 7/7 >>   SIGNIFICANT EVENTS: 7/07  Admit with N/V, weakness, hypotension   LINES/TUBES: ETT 7/8 >>  [facility] 7/8 >>   DISCUSSION: 71 y/o female with a past medical history of osteoporosis treated with prolia was admitted with several months of nausea, vomiting which lead to dyspnea related to aspiration pneumonia and septic shock.  Now with progressive metabolic acidosis, acute respiratory failure and likely demand ischemia.  DDx of shock 7/8 is septic shock vs cardiogenic shock.    ASSESSMENT / PLAN:  PULMONARY A: Acute Respiratory  Failure with hypoxemia due to aspiration pneumonia  Centrilobular Emphysema  P:   Full mechanical vent support > high RR for now to keep up with metabolic acidosis, but will need to watch closely for air trapping VAP prevention Daily WUA/SBT D/C Breo Start brovana/pulmicort Start duoneb  CARDIOVASCULAR A:   Bradycardia  Elevated Troponin - suspect demand ischemia  Hx Grade II Diastolic Dysfunction, LVEF Hx Carotid Stenosis  Shock: presumably septic vs cardiogenic P:  Echo now Continue heparin Check probnp Levophed titrated to MAP > 65 Check CVP Check [facility] tele  RENAL A:   AKI Non-gap metabolic acidosis P:   Monitor BMET and UOP Replace electrolytes as needed Check   GASTROINTESTINAL A:   Nausea / Vomiting, Weight Loss - ? If related to Prolia  Elevated alk phos without evidence of biliary obstruction P:   NPO / OGT Pepcid for SUP  Consider tube feedings tomorrow  HEMATOLOGIC A:   Anemia  P:  Monitor for bleeding  INFECTIOUS A:   Aspiration pneumonia P:   Send resp culture Continue zosyn  ENDOCRINE A:   Hyperglycemia    P:   SSI  NEUROLOGIC A:   Mild confusion/acute encephalopathy due to sepsis/shock P:   RASS goal: -1 Fentanyl gtt Versed prn   FAMILY  - Updates: updated husband and son at bedside at length 7/8  - Inter-disciplinary family meet or Palliative Care meeting due by:  day 7   CC Time: 90 minutes independent of procedures  Roselie Awkward, MD [facility] Pager: [phone] Cell: [phone] After 3pm or if no response, call [phone]

## 2017-04-21 NOTE — Progress Notes (Signed)
[facility] Progress Note Patient Name: Alexandra House DOB: [birth year] MRN: [id number]   Date of Service  04/21/2017  HPI/Events of Note  Lactic Acid = 9.7 --> 6.4. Currently on Dobutamine IV infusion at 5 mcg/kg/min for COOX = 59.3.   eICU Interventions  Will order: 1. Continue to trend lactic acid.  2. Await CzOOX at 12 midnight.      Intervention Category Major Interventions: Acid-Base disturbance - evaluation and management  Henritta Mutz Eugene 04/21/2017, 10:06 PM

## 2017-04-21 NOTE — Progress Notes (Signed)
[facility] Progress Note Patient Name: Alexandra House DOB: 06/27/1946 MRN: [id number]   Date of Service  04/21/2017  HPI/Events of Note  COOX = 59.3% on Dobutamine at 2.5 mcg/kg/min. BP = 106/61 and HR = 92.   eICU Interventions  Will order: 1. Increase Dobutamine iV infusion to 5 mcg/kg/min. 2. Repeat COOX at 12 midnight.      Intervention Category Major Interventions: Acid-Base disturbance - evaluation and management  Sommer,Steven Eugene 04/21/2017, 9:43 PM

## 2017-04-21 NOTE — Significant Event (Signed)
Rapid Response Event Note  Overview: Time Called: 1100 Arrival Time: 1104 Event Type: Hypotension, Cardiac  Initial Focused Assessment: Patient with symptomatic hypotension and bradycardia per staff. Dr Venetia Constable at bedside Upon my arrival patient in trendelenburg with NS bolus infusing.  She was beginning to become restless and wanted to sit up.  Per Rn no crackles noted. BP 80/50  SB 40-50 post 1 atropine, RR 30-40  O2 sats 100% on RA  Temp 97.3 She is dusky and mottled.  Interventions: Adjusted bed position and assisted patient to sitting up right. Placed patient on 4L [place] Levophed started at 66mcg Crackles and JVD noted.  Fluids stopped Solumedrol given IV BP improved to 100/60 Then dropped to 68/40  And she began to complain of being dizzy. Levophed increased to 27mcg Patient color much improved but she is still very restless and confused. Family at bedside, MD spoke with Husband and son  Transferred to [place] (if not transferred):  Event Summary: Name of Physician Notified: Aileen Fass at bedside at    Name of Consulting Physician Notified: CCM at 1105  Outcome: Transferred (Comment)  Event End Time: [place]  Raliegh Ip

## 2017-04-21 NOTE — Progress Notes (Signed)
Rt attempted to collect a sputum sample, but was unsuccessful at this time.

## 2017-04-21 NOTE — Patient Care Conference (Signed)
Pt transferred to 53M room 13 with Rapid Response RN. Bedside report given to 53M RN

## 2017-04-21 NOTE — Progress Notes (Signed)
Received call from [place] from IV team, she wanted OK from MD that it is ok to place picc with Cr 2.64. MD paged, waiting for call back.

## 2017-04-21 NOTE — Progress Notes (Signed)
Critical lactic acid 9.7 results received from [facility] and relayed to Dr Oletta Darter eMD.

## 2017-04-21 NOTE — Progress Notes (Signed)
  Echocardiogram 2D Echocardiogram has been performed.  Alexandra House 04/21/2017, 4:58 PM

## 2017-04-21 NOTE — Progress Notes (Signed)
Progress Note  Patient Name: Alexandra House Date of Encounter: 04/21/2017  Primary Cardiologist:   Dr. Stanford Breed  Subjective   No chest pain.   However, she is acutely ill.   Possible aspiration  Inpatient Medications    Scheduled Meds: . fentaNYL      . fluticasone furoate-vilanterol  1 puff Inhalation Daily  . guaiFENesin  600 mg Oral BID  . methylPREDNISolone (SOLU-MEDROL) injection  60 mg Intravenous Q12H  . methylPREDNISolone sodium succinate      . midazolam      . mometasone-formoterol  2 puff Inhalation BID  . pantoprazole (PROTONIX) IV  40 mg Intravenous Q12H  . sodium bicarbonate  50 mEq Intravenous Once  . sodium chloride flush  3 mL Intravenous Q12H   Continuous Infusions: . sodium chloride 125 mL/hr (04/21/17 0927)  . calcium gluconate    . heparin Stopped (04/21/17 1116)  . norepinephrine (LEVOPHED) Adult infusion 5 mcg/min (04/21/17 1224)  . piperacillin-tazobactam (ZOSYN)  IV 2.25 g (04/21/17 0609)   PRN Meds: acetaminophen **OR** acetaminophen, albuterol, ondansetron **OR** ondansetron (ZOFRAN) IV   Vital Signs    Vitals:   04/21/17 1118 04/21/17 1120 04/21/17 1157 04/21/17 1200  BP: 94/64 100/60  134/70  Pulse: (!) 52 (!) 53    Resp: 19 19    Temp:   (!) 97.3 F (36.3 C)   TempSrc:   Oral   SpO2: 100% 100%    Weight:      Height:        Intake/Output Summary (Last 24 hours) at 04/21/17 1254 Last data filed at 04/21/17 0927  Gross per 24 hour  Intake          3685.65 ml  Output               90 ml  Net          3595.65 ml   Filed Weights   04/20/17 1031 04/20/17 1537 04/21/17 0405  Weight: 82 lb (37.2 kg) 94 lb 6.4 oz (42.8 kg) 103 lb 8 oz (46.9 kg)    Telemetry    NSR, sinus brady - Personally Reviewed  ECG    NA - Personally Reviewed  Physical Exam   GEN: Positive acute distress.   Neck: No  JVD Cardiac: rRR, NO murmurs, rubs, or gallops.  Respiratory: Clear  to auscultation bilaterally. GI: Soft, nontender,  non-distended  MS: No  edema; No deformity. Neuro:  Nonfocal  Psych: Normal affect   Labs    Chemistry Recent Labs Lab 04/14/17 1433 04/20/17 1050 04/20/17 1156 04/21/17 0138  NA 131* 135 137 136  K 3.9 4.9 4.8 4.1  CL 103 101 102 108  CO2 21* 18*  --  18*  GLUCOSE 131* 140* 132* 146*  BUN 17 35* 36* 31*  CREATININE 1.08* 3.09* 2.80* 2.64*  CALCIUM 8.1* 8.1*  --  5.7*  PROT 6.8 7.0  --  5.1*  ALBUMIN 2.5* 2.5*  --  1.7*  AST 46* 113*  --  142*  ALT 21 48  --  54  ALKPHOS 148* 238*  --  168*  BILITOT 0.3 0.4  --  0.7  GFRNONAA 50* 14*  --  17*  GFRAA 58* 16*  --  20*  ANIONGAP 7 16*  --  10     Hematology Recent Labs Lab 04/14/17 1433 04/20/17 1050 04/20/17 1156 04/21/17 0138  WBC 14.6* 27.7*  --  20.1*  RBC 3.91 3.96  --  3.32*  HGB 11.1* 10.9* 13.6 9.1*  HCT 33.1* 34.0* 40.0 27.8*  MCV 84.7 85.9  --  83.7  MCH 28.4 27.5  --  26.8  MCHC 33.5 32.1  --  32.0  RDW 13.8 14.0  --  14.1  PLT 521* 709*  --  624*    Cardiac Enzymes Recent Labs Lab 04/20/17 1340 04/20/17 2004 04/21/17 0138  TROPONINI 2.02* 2.08* 3.82*    Recent Labs Lab 04/20/17 1116  TROPIPOC 2.05*     BNPNo results for input(s): BNP, PROBNP in the last 168 hours.   DDimer No results for input(s): DDIMER in the last 168 hours.   Radiology    Ct Abdomen Pelvis Wo Contrast  Result Date: 04/20/2017 CLINICAL DATA:  Patient with weakness, nausea and vomiting. EXAM: CT CHEST, ABDOMEN AND PELVIS WITHOUT CONTRAST TECHNIQUE: Multidetector CT imaging of the chest, abdomen and pelvis was performed following the standard protocol without IV contrast. COMPARISON:  CT chest 01/07/2017 FINDINGS: CT CHEST FINDINGS Cardiovascular: Normal heart size. No pericardial effusion. Aortic atherosclerosis. Coronary arterial atherosclerosis. Mediastinum/Nodes: Interval development of mediastinal adenopathy including a 1.1 cm subcarinal lymph node (image 27; series 3) and a 1.0 cm prevascular lymph node.  Additionally there is new fullness within the hila bilaterally most suggestive of hilar adenopathy, poorly visualized without IV contrast. No axillary adenopathy. Small hiatal hernia. Lungs/Pleura: Centrilobular and paraseptal emphysematous changes. Interval development of patchy irregular consolidative opacities throughout the lungs bilaterally with a somewhat basilar predominance. Additionally there is new peribronchial thickening. No pleural effusion or pneumothorax. Musculoskeletal: No aggressive or acute appearing osseous lesions. CT ABDOMEN PELVIS FINDINGS Hepatobiliary: Liver is diffusely low in attenuation compatible with steatosis. Additionally there is suggestion of mild gallbladder wall thickening measuring up to 7 mm (image 66; series 3). There is fat stranding within the porta hepatis and about the pancreas. Small amount of fluid inferior to the right hepatic lobe. Pancreas: Mild parenchymal atrophy. Small amount of surrounding fat stranding. Spleen: Unremarkable Adrenals/Urinary Tract: Adrenal glands are normal. Kidneys are symmetric in size. No hydronephrosis. Urinary bladder is unremarkable. Stomach/Bowel: No abnormal bowel wall thickening or evidence for bowel obstruction. Decompressed sigmoid colon. Sigmoid colonic diverticulosis without evidence for acute diverticulitis. No free intraperitoneal air. Vascular/Lymphatic: Normal caliber abdominal aorta. No retroperitoneal lymphadenopathy. Reproductive: Uterus and adnexal structures are unremarkable. Other: None. Musculoskeletal: No aggressive or acute appearing osseous lesions. IMPRESSION: Interval development of irregular patchy consolidative opacities throughout the lungs bilaterally concerning for the possibility of multifocal infection. There is fat stranding within the porta hepatis and about the pancreatic parenchyma. Findings are nonspecific in etiology and may be reactive. Sequelae of acalculous cholecystitis, pancreatitis or potentially  hepatitis are not excluded. Recommend clinical and laboratory correlation. Hepatic steatosis. These results will be called to the ordering clinician or representative by the Radiologist Assistant, and communication documented in the PACS or zVision Dashboard. Electronically Signed   By: Lovey Newcomer M.D.   On: 04/20/2017 16:03   Ct Chest Wo Contrast  Result Date: 04/20/2017 CLINICAL DATA:  Patient with weakness, nausea and vomiting. EXAM: CT CHEST, ABDOMEN AND PELVIS WITHOUT CONTRAST TECHNIQUE: Multidetector CT imaging of the chest, abdomen and pelvis was performed following the standard protocol without IV contrast. COMPARISON:  CT chest 01/07/2017 FINDINGS: CT CHEST FINDINGS Cardiovascular: Normal heart size. No pericardial effusion. Aortic atherosclerosis. Coronary arterial atherosclerosis. Mediastinum/Nodes: Interval development of mediastinal adenopathy including a 1.1 cm subcarinal lymph node (image 27; series 3) and a 1.0 cm prevascular lymph node. Additionally there is new fullness  within the hila bilaterally most suggestive of hilar adenopathy, poorly visualized without IV contrast. No axillary adenopathy. Small hiatal hernia. Lungs/Pleura: Centrilobular and paraseptal emphysematous changes. Interval development of patchy irregular consolidative opacities throughout the lungs bilaterally with a somewhat basilar predominance. Additionally there is new peribronchial thickening. No pleural effusion or pneumothorax. Musculoskeletal: No aggressive or acute appearing osseous lesions. CT ABDOMEN PELVIS FINDINGS Hepatobiliary: Liver is diffusely low in attenuation compatible with steatosis. Additionally there is suggestion of mild gallbladder wall thickening measuring up to 7 mm (image 66; series 3). There is fat stranding within the porta hepatis and about the pancreas. Small amount of fluid inferior to the right hepatic lobe. Pancreas: Mild parenchymal atrophy. Small amount of surrounding fat stranding. Spleen:  Unremarkable Adrenals/Urinary Tract: Adrenal glands are normal. Kidneys are symmetric in size. No hydronephrosis. Urinary bladder is unremarkable. Stomach/Bowel: No abnormal bowel wall thickening or evidence for bowel obstruction. Decompressed sigmoid colon. Sigmoid colonic diverticulosis without evidence for acute diverticulitis. No free intraperitoneal air. Vascular/Lymphatic: Normal caliber abdominal aorta. No retroperitoneal lymphadenopathy. Reproductive: Uterus and adnexal structures are unremarkable. Other: None. Musculoskeletal: No aggressive or acute appearing osseous lesions. IMPRESSION: Interval development of irregular patchy consolidative opacities throughout the lungs bilaterally concerning for the possibility of multifocal infection. There is fat stranding within the porta hepatis and about the pancreatic parenchyma. Findings are nonspecific in etiology and may be reactive. Sequelae of acalculous cholecystitis, pancreatitis or potentially hepatitis are not excluded. Recommend clinical and laboratory correlation. Hepatic steatosis. These results will be called to the ordering clinician or representative by the Radiologist Assistant, and communication documented in the PACS or zVision Dashboard. Electronically Signed   By: Lovey Newcomer M.D.   On: 04/20/2017 16:03   Dg Chest Port 1 View  Result Date: 04/21/2017 CLINICAL DATA:  Shortness of breath.  COPD.  Syncope.  Smoker. EXAM: PORTABLE CHEST 1 VIEW COMPARISON:  CT 04/20/2017 plain films 04/20/2017. FINDINGS: External pacer projects over the left side of the chest. Midline trachea. Mild cardiomegaly. Biapical pleural thickening. No pleural effusion or pneumothorax. Since the prior plain film, patchy right lower and left perihilar airspace disease is consistent with developing infection. This is superimposed upon hyperinflation and chronic interstitial thickening of COPD/chronic bronchitis. IMPRESSION: Multifocal airspace opacities, most consistent with  infection. Appears progressive since the radiograph of 1 day prior but grossly similar to yesterday's CT. Electronically Signed   By: Abigail Miyamoto M.D.   On: 04/21/2017 12:03   Dg Chest Port 1 View  Result Date: 04/20/2017 CLINICAL DATA:  Patient with continued weakness, nausea and vomiting. EXAM: PORTABLE CHEST 1 VIEW COMPARISON:  Chest radiograph 04/14/2017 FINDINGS: Monitoring leads overlie the patient. Normal cardiac and mediastinal contours. Grossly unchanged coarse interstitial pulmonary opacities. Biapical pleuroparenchymal thickening. No pleural effusion or pneumothorax. IMPRESSION: No acute cardiopulmonary process. Chronic interstitial pulmonary opacities. Electronically Signed   By: Lovey Newcomer M.D.   On: 04/20/2017 11:14    Cardiac Studies   Echo pending.  Patient Profile     71 y.o. female who presented with progressive weakness and ongoing abdominal complaints.  In the ED she was hypotensive.  Creat is acutely elevated and lactic acid is elevated.  WBC is 27.7.   Her BP has responded to hydration.  We are consulted secondary to a troponin of 2.05.    Assessment & Plan    HYPOTENSION:  Suspect septic shock.  Now with possible aspiration.  She is going to need intubation. Acute management per CCM.  Echo will be done  today.  Bedside echo yesterday with poor windows but overall preserved EF.    ELEVATED TROPONIN:  I think this is secondary.  Continuing heparin for now.  BRADYCARDIA:  Continued bradycardia.  However, rate now up slightly with acute illness.  No pauses.  Continue to follow.    Signed, Minus Breeding, MD  04/21/2017, 12:54 PM

## 2017-04-21 NOTE — Procedures (Signed)
Intubation Procedure Note SHAENA PARKERSON [id number] 08-05-1946  Procedure: Intubation Indications: Airway protection and maintenance  Procedure Details Consent: Risks of procedure as well as the alternatives and risks of each were explained to the (patient/caregiver).  Consent for procedure obtained. Time Out: Verified patient identification, verified procedure, site/side was marked, verified correct patient position, special equipment/implants available, medications/allergies/relevent history reviewed, required imaging and test results available.  Performed  Drugs Etomidate 40m Versed 12m Fentanyl 25 mcg, Rocuronium 4084mDL x 1 with MAC 3 blade Grade 1 view 7.5 ET tube passed through cords under direct visualization Placement confirmed with bilateral breath sounds, positive EtCO2 change and smoke in tube   Evaluation Hemodynamic Status: BP stable throughout; O2 sats: stable throughout Patient's Current Condition: stable Complications: No apparent complications Patient did tolerate procedure well. Chest X-ray ordered to verify placement.  CXR: pending.   DouSimonne Maffucci8/2018

## 2017-04-21 NOTE — Progress Notes (Signed)
[facility] Progress Note Patient Name: Alexandra House DOB: 03/23/1946 MRN: [id number]   Date of Service  04/21/2017  HPI/Events of Note  Lactic Acid = 9.7 - BP = 131/69 (MAP = 86). CVP = 10-11. Hgb = 8.4 and Coox = 64.5%.  eICU Interventions  Will order: 1. Dobuamine IV infusion at 2.5 mcg/kg/min. 2. Repeat COOX at 7 PM.     Intervention Category Major Interventions: Acid-Base disturbance - evaluation and management  Allexis Bordenave Eugene 04/21/2017, 5:00 PM

## 2017-04-22 ENCOUNTER — Inpatient Hospital Stay (HOSPITAL_COMMUNITY): Payer: Medicare HMO

## 2017-04-22 DIAGNOSIS — E876 Hypokalemia: Secondary | ICD-10-CM

## 2017-04-22 DIAGNOSIS — I248 Other forms of acute ischemic heart disease: Secondary | ICD-10-CM

## 2017-04-22 LAB — CBC
HEMATOCRIT: 24 % — AB (ref 36.0–46.0)
HEMOGLOBIN: 8.1 g/dL — AB (ref 12.0–15.0)
MCH: 27.7 pg (ref 26.0–34.0)
MCHC: 33.8 g/dL (ref 30.0–36.0)
MCV: 82.2 fL (ref 78.0–100.0)
Platelets: 585 10*3/uL — ABNORMAL HIGH (ref 150–400)
RBC: 2.92 MIL/uL — ABNORMAL LOW (ref 3.87–5.11)
RDW: 13.9 % (ref 11.5–15.5)
WBC: 22.2 10*3/uL — ABNORMAL HIGH (ref 4.0–10.5)

## 2017-04-22 LAB — LACTIC ACID, PLASMA: LACTIC ACID, VENOUS: 2.3 mmol/L — AB (ref 0.5–1.9)

## 2017-04-22 LAB — GLUCOSE, CAPILLARY
GLUCOSE-CAPILLARY: 130 mg/dL — AB (ref 65–99)
GLUCOSE-CAPILLARY: 127 mg/dL — AB (ref 65–99)
Glucose-Capillary: 115 mg/dL — ABNORMAL HIGH (ref 65–99)
Glucose-Capillary: 143 mg/dL — ABNORMAL HIGH (ref 65–99)
Glucose-Capillary: 138 mg/dL — ABNORMAL HIGH (ref 65–99)

## 2017-04-22 LAB — BLOOD GAS, ARTERIAL
Acid-base deficit: 6.3 mmol/L — ABNORMAL HIGH (ref 0.0–2.0)
BICARBONATE: 17.3 mmol/L — AB (ref 20.0–28.0)
FIO2: 30
MECHVT: 360 mL
O2 Saturation: 98.7 %
PEEP/CPAP: 5 cmH2O
PH ART: 7.432 (ref 7.350–7.450)
PO2 ART: 158 mmHg — AB (ref 83.0–108.0)
Patient temperature: 97.3
RATE: 30 resp/min
SAMPLE TYPE: 441371
pCO2 arterial: 26.2 mmHg — ABNORMAL LOW (ref 32.0–48.0)

## 2017-04-22 LAB — VANCOMYCIN, RANDOM: Vancomycin Rm: 13

## 2017-04-22 LAB — HEPARIN LEVEL (UNFRACTIONATED)
Heparin Unfractionated: 0.44 IU/mL (ref 0.30–0.70)
Heparin Unfractionated: 0.16 IU/mL — ABNORMAL LOW (ref 0.30–0.70)

## 2017-04-22 LAB — BASIC METABOLIC PANEL
ANION GAP: 10 (ref 5–15)
BUN: 31 mg/dL — ABNORMAL HIGH (ref 6–20)
CO2: 21 mmol/L — AB (ref 22–32)
Calcium: 5.6 mg/dL — CL (ref 8.9–10.3)
Chloride: 111 mmol/L (ref 101–111)
Creatinine, Ser: 1.89 mg/dL — ABNORMAL HIGH (ref 0.44–1.00)
GFR calc Af Amer: 29 mL/min — ABNORMAL LOW (ref >60)
GFR calc non Af Amer: 25 mL/min — ABNORMAL LOW (ref >60)
GLUCOSE: 126 mg/dL — AB (ref 65–99)
POTASSIUM: 2.7 mmol/L — AB (ref 3.5–5.1)
Sodium: 142 mmol/L (ref 135–145)

## 2017-04-22 LAB — URINE CULTURE: Culture: NO GROWTH

## 2017-04-22 MED ORDER — CALCIUM GLUCONATE 10 % IV SOLN
1.0000 g | Freq: Once | INTRAVENOUS | Status: AC
  Administered 2017-04-22: 1 g via INTRAVENOUS
  Filled 2017-04-22: qty 10

## 2017-04-22 MED ORDER — PRO-STAT SUGAR FREE PO LIQD
30.0000 mL | Freq: Two times a day (BID) | ORAL | Status: DC
  Administered 2017-04-22: 30 mL
  Filled 2017-04-22: qty 30

## 2017-04-22 MED ORDER — POTASSIUM CHLORIDE 20 MEQ/15ML (10%) PO SOLN
40.0000 meq | ORAL | Status: AC
  Administered 2017-04-22 (×2): 40 meq
  Filled 2017-04-22 (×2): qty 30

## 2017-04-22 MED ORDER — VITAL AF 1.2 CAL PO LIQD
1000.0000 mL | ORAL | Status: DC
  Administered 2017-04-23 – 2017-04-24 (×2): 1000 mL
  Filled 2017-04-22 (×3): qty 1000

## 2017-04-22 MED ORDER — VANCOMYCIN HCL 500 MG IV SOLR
500.0000 mg | INTRAVENOUS | Status: DC
  Administered 2017-04-22 – 2017-04-24 (×2): 500 mg via INTRAVENOUS
  Filled 2017-04-22 (×2): qty 500

## 2017-04-22 MED ORDER — LORAZEPAM 2 MG/ML IJ SOLN
1.0000 mg | Freq: Two times a day (BID) | INTRAMUSCULAR | Status: DC | PRN
  Administered 2017-04-22 – 2017-04-26 (×7): 1 mg via INTRAVENOUS
  Filled 2017-04-22 (×7): qty 1

## 2017-04-22 MED ORDER — VITAL HIGH PROTEIN PO LIQD
1000.0000 mL | ORAL | Status: DC
  Administered 2017-04-22: 1000 mL

## 2017-04-22 NOTE — Progress Notes (Signed)
PULMONARY / CRITICAL CARE MEDICINE   Name: Alexandra House MRN: [id number] DOB: 1946-04-25    ADMISSION DATE:  04/20/2017 CONSULTATION DATE:  04/21/17  REFERRING MD:  Dr. Aileen Fass   CHIEF COMPLAINT:  AMS, Hypotension, Hypothermia, Bradycardia   HISTORY OF PRESENT ILLNESS:   The patient presented with nausea and vomiting.  She has had an continued problem with this requiring ED visits and leading to EGD and ultrasound.   She has had weight loss and loss of functional level.  Today she presented to the ED with weakness.  This has been progressive but she was even more so this AM.  Her husband says that she was not really able to get out of bed.  She did not have some vague mild chest discomfort but she reports that this was very mild.  She has had continued nausea and anorexia.  She was not having any jaw or arm pain.  She had an episode of syncope two weeks ago while at church but she has not had any since then and no presyncope or orthostasis.  She has not have new SOB, PND or orthopnea.  In the ED she is found to have bradycardia.  She was hypotensive.  Creat is acutely elevated and lactic acid is elevated.  WBC is 27.7.   Her BP has responded to hydration.  We are consulted secondary to a troponin of 2.05.      SUBJECTIVE:  No acute overnight events. Levophed turned off overnight. Back on at 53mg this AM. Appearing somewhat agitated this AM.   VITAL SIGNS: BP (!) 119/102 (BP Location: Right Arm)   Pulse 68   Temp 98 F (36.7 C) (Oral)   Resp (!) 25   Ht 5' (1.524 m)   Wt 100 lb 15.5 oz (45.8 kg)   SpO2 100%   BMI 19.72 kg/m   HEMODYNAMICS: CVP:  [3 mmHg-11 mmHg] 9 mmHg  VENTILATOR SETTINGS: Vent Mode: PRVC FiO2 (%):  [40 %-100 %] 40 % Set Rate:  [30 bmp] 30 bmp Vt Set:  [360 mL] 360 mL PEEP:  [5 cmH20] 5 cmH20 Plateau Pressure:  [14 cmH20-18 cmH20] 15 cmH20  INTAKE / OUTPUT: I/O last 3 completed shifts: In: 5547.5 [I.V.:5077.5; Other:120; IV Piggyback:350] Out: 720  [Urine:720]  PHYSICAL EXAMINATION: General:  71yoF intubated sitting up in bed appearing uncomfortable, but in NAD Neuro: alert, responds to commands, attempting to speak HEENT:  AT/[place], MMM, PERRL Cardiovascular:  RRR, no MRG Lungs:  CTABL Abdomen:  +BS, soft, NTND Musculoskeletal:  No gross deformities Extremities: trace edema  LABS:  BMET  Recent Labs Lab 04/21/17 0138 04/21/17 1357 04/22/17 0343  NA 136 144 142  K 4.1 3.1* 2.7*  CL 108 108 111  CO2 18* 18* 21*  BUN 31* 32* 31*  CREATININE 2.64* 2.55* 1.89*  GLUCOSE 146* 163* 126*    Electrolytes  Recent Labs Lab 04/20/17 1340 04/20/17 2004 04/21/17 0138 04/21/17 1357 04/22/17 0343  CALCIUM  --   --  5.7* 5.3* 5.6*  MG 1.8 1.9  --   --   --   PHOS 5.2* 4.7*  --   --   --     CBC  Recent Labs Lab 04/21/17 0138 04/21/17 1357 04/22/17 0343  WBC 20.1* 22.2* 22.2*  HGB 9.1* 8.4* 8.1*  HCT 27.8* 26.1* 24.0*  PLT 624* 636* 585*    Coag's  Recent Labs Lab 04/20/17 1050  INR 1.10    Sepsis Markers  Recent  Labs Lab 04/20/17 1340 04/20/17 1353 04/21/17 1357 04/21/17 1755  LATICACIDVEN  --  3.80* 9.7* 6.4*  PROCALCITON 1.07  --   --   --     ABG  Recent Labs Lab 04/21/17 1220 04/21/17 1557  PHART 7.331* 7.432  PCO2ART BELOW REPORTABLE RANGE 26.2*  PO2ART 121.00* 158*    Liver Enzymes  Recent Labs Lab 04/20/17 1050 04/21/17 0138 04/21/17 1357  AST 113* 142* 195*  ALT 48 54 71*  ALKPHOS 238* 168* 142*  BILITOT 0.4 0.7 0.5  ALBUMIN 2.5* 1.7* 1.8*    Cardiac Enzymes  Recent Labs Lab 04/20/17 1340 04/20/17 2004 04/21/17 0138  TROPONINI 2.02* 2.08* 3.82*    Glucose  Recent Labs Lab 04/21/17 1142 04/21/17 2004  GLUCAP 148* 143*    Imaging Dg Chest Port 1 View  Result Date: 04/22/2017 CLINICAL DATA:  Initial evaluation for acute respiratory failure with hypoxia. EXAM: PORTABLE CHEST 1 VIEW COMPARISON:  Prior radiograph from 04/21/2017. FINDINGS: Patient remains  intubated with the tip of the endotracheal tube positioned approximately 2.7 cm above the carina. Left IJ approach centra venous catheter remains in place with tip overlying the distal SVC. Enteric tube courses in the the abdomen. Stable cardiac and mediastinal silhouettes. Lungs mildly hypoinflated. Similar bilateral coarse interstitial opacities. Slightly worsened hazy density within the peripheral right lung base. Suspected small right pleural effusion. No pulmonary edema. No pneumothorax. Osseous structures unchanged. IMPRESSION: 1. Support apparatus in satisfactory position as above. 2. Similar coarse bilateral interstitial opacities, which may reflect sequelae of underlying infection in the correct clinical setting. Slightly increased hazy opacity at the peripheral right lung base as compared to previous. 3. Suspected small right pleural effusion. Electronically Signed   By: Jeannine Boga M.D.   On: 04/22/2017 06:53   Dg Chest Port 1 View  Result Date: 04/21/2017 CLINICAL DATA:  Patient status post intubation. Acute respiratory failure. EXAM: PORTABLE CHEST 1 VIEW COMPARISON:  Chest radiograph 04/21/2017 FINDINGS: ET tube terminates in the proximal right mainstem bronchus. Left IJ central venous catheter tip projects over the superior vena cava. Enteric tube appears coiled overlying the thoracic inlet. Pacer pad overlies the left hemithorax. Monitoring leads overlie the patient. Stable cardiac and mediastinal contours. Grossly unchanged diffuse bilateral coarse interstitial pulmonary opacities. No pleural effusion or pneumothorax. IMPRESSION: ET tube terminates in the proximal right mainstem bronchus, recommend retraction approximately 2.5 cm. Enteric tube appears coiled at the thoracic inlet, recommend repositioning. Similar-appearing diffuse bilateral coarse interstitial pulmonary opacities concerning for pneumonia in the appropriate clinical setting. Followup PA and lateral chest X-ray is  recommended in 3-4 weeks following trial of antibiotic therapy to ensure resolution and exclude underlying malignancy. These results were called by telephone at the time of interpretation on 04/21/2017 at 2:17 pm to Nurse Eilleen Kempf , who verbally acknowledged these results. Electronically Signed   By: Lovey Newcomer M.D.   On: 04/21/2017 14:24   Dg Chest Port 1 View  Result Date: 04/21/2017 CLINICAL DATA:  Shortness of breath.  COPD.  Syncope.  Smoker. EXAM: PORTABLE CHEST 1 VIEW COMPARISON:  CT 04/20/2017 plain films 04/20/2017. FINDINGS: External pacer projects over the left side of the chest. Midline trachea. Mild cardiomegaly. Biapical pleural thickening. No pleural effusion or pneumothorax. Since the prior plain film, patchy right lower and left perihilar airspace disease is consistent with developing infection. This is superimposed upon hyperinflation and chronic interstitial thickening of COPD/chronic bronchitis. IMPRESSION: Multifocal airspace opacities, most consistent with infection. Appears progressive since the  radiograph of 1 day prior but grossly similar to yesterday's CT. Electronically Signed   By: Abigail Miyamoto M.D.   On: 04/21/2017 12:03   Dg Abd Portable 1v  Result Date: 04/21/2017 CLINICAL DATA:  Orogastric tube placement EXAM: PORTABLE ABDOMEN - 1 VIEW COMPARISON:  CT abdomen pelvis April 20, 2017 FINDINGS: Orogastric tube tip and side port are in the stomach. There is no bowel dilatation or air-fluid level to suggest bowel obstruction. No free air. There is aortoiliac atherosclerosis. Visualized lung bases are clear. IMPRESSION: Orogastric tube tip and side port in stomach. No bowel obstruction or free air evident. There is aortoiliac atherosclerosis. Aortic Atherosclerosis (ICD10-I70.0). Electronically Signed   By: Lowella Grip III M.D.   On: 04/21/2017 15:32     STUDIES:  7/05  [place] ABD >> abdominal US on 7/5 which was negative for gallstones, hepatic abnormality, normal  pancreas & no hydronephrosis.   7/7 CT chest/abdomen/pelvis> emphysema, mult-lobar pneumonia, fatty liver, fat stranding around head of pancreas  CULTURES: BCx2 7/7 >>  UC 7/7 >>  resp culture 7/8 >   ANTIBIOTICS: Zosyn 7/7 >> 7/9 Vanco 7/7 >> 7/9  SIGNIFICANT EVENTS: 7/07  Admit with N/V, weakness, hypotension  7/8 >> intubated  LINES/TUBES: ETT 7/8 >>  CVC [facility] 7/8 >>  PIV x2 LA forearm, right wrist 7/8>>  DISCUSSION: 71 y/o female with a past medical history of osteoporosis treated with prolia was admitted with several months of nausea, vomiting which lead to dyspnea related to aspiration pneumonia and septic shock.  Now with progressive metabolic acidosis, acute respiratory failure and likely demand ischemia.  DDx of shock 7/8 is septic shock vs cardiogenic shock.    ASSESSMENT / PLAN:  PULMONARY A: Acute Respiratory Failure with hypoxemia due to aspiration pneumonia  Centrilobular Emphysema  P:   Full mechanical vent support > high RR for now to keep up with metabolic acidosis, but will need to watch closely for air trapping VAP prevention Daily WUA/SBT Start brovana/pulmicort, mucinex Start duoneb  CARDIOVASCULAR A:  Bradycardia  Elevated Troponin - suspect demand ischemia, 2.02>2.08>3.8 Hx Grade II Diastolic Dysfunction, LVEF Hx Carotid Stenosis  Shock: presumably septic vs cardiogenic P:  Echo now; Regional wall motion abnormality: Hypokinesis of the basal-mid   anteroseptal, basal-mid inferoseptal, basal-mid inferior, and   apical septal myocardium. Continue heparin Check probnp Levophed titrated to MAP > 65, map to 55 Check CVP Check coox tele  RENAL A:   AKI Non-gap metabolic acidosis P:   Monitor BMET and UOP Replace electrolytes as needed; KCl 48mQ per tube q4hrs  GASTROINTESTINAL A:   Nausea / Vomiting, Weight Loss - ? If related to Prolia  Elevated alk phos without evidence of biliary obstruction P:   NPO / OGT Pepcid for  SUP  Consider tube feedings 7/9  HEMATOLOGIC A:   Anemia, Dilutional? P:  Monitor for bleeding Transfusion threshold <8  INFECTIOUS A:   Aspiration pneumonia P:   Send resp culture Continue zosyn Added vancomycin, monitor troph level  ENDOCRINE A:   Hyperglycemia    P:   SSI  NEUROLOGIC A:   Mild confusion/acute encephalopathy due to sepsis/shock P:   RASS goal: -1 Fentanyl gtt Versed prn SBT daily Added ativan 16mBID PRN (patient takes this at home)   FAMILY  - Updates: updated husband and son at bedside at length 7/9  - Inter-disciplinary family meet or Palliative Care meeting due by:  day 7    [facility] Pager: [phone]  04/22/2017, 7:46 AM

## 2017-04-22 NOTE — Progress Notes (Signed)
CRITICAL VALUE ALERT  Critical Value:  K+ 2.7 Ca 5.6  Date & Time Notied:  04/22/17 0435  Provider Notified: Dr. Elsworth Soho  Orders Received/Actions taken: will replace electrolytes per order

## 2017-04-22 NOTE — Progress Notes (Signed)
Pharmacy Antibiotic Note  Alexandra House is a 71 y.o. female admitted on 04/20/2017 with sepsis.  Pharmacy has been consulted for Vancomycin dosing. Random vancomycin level this AM is 13 ( around 40 hours after Vancomycin 1000 mg IV x 1)  Plan: -Start vancomycin 500 mg IV q48h -Already on Zosyn -Re-check drug levels as indicated    Height: 5' (152.4 cm) Weight: 103 lb 8 oz (46.9 kg) IBW/kg (Calculated) : 45.5  Temp (24hrs), Avg:97.5 F (36.4 C), Min:96.3 F (35.7 C), Max:98.2 F (36.8 C)   Recent Labs Lab 04/20/17 1050 04/20/17 1119 04/20/17 1156 04/20/17 1353 04/21/17 0138 04/21/17 1357 04/21/17 1755 04/22/17 0343  WBC 27.7*  --   --   --  20.1* 22.2*  --  22.2*  CREATININE 3.09*  --  2.80*  --  2.64* 2.55*  --  1.89*  LATICACIDVEN  --  6.39*  --  3.80*  --  9.7* 6.4*  --   VANCORANDOM  --   --   --   --   --   --   --  13    Estimated Creatinine Clearance: 19 mL/min (A) (by C-G formula based on SCr of 1.89 mg/dL (H)).    Allergies  Allergen Reactions  . Bee Venom Anaphylaxis and Hives     Narda Bonds 04/22/2017 4:39 AM

## 2017-04-22 NOTE — Progress Notes (Signed)
Progress Note  Patient Name: Alexandra House Date of Encounter: 04/22/2017  Primary Cardiologist:   Dr. Stanford Breed  Subjective   No chest pain.  Awake on the vent. Wants to get the tube out. Son at bedside.  Inpatient Medications    Scheduled Meds: . arformoterol  15 mcg Nebulization BID  . budesonide (PULMICORT) nebulizer solution  0.5 mg Nebulization BID  . chlorhexidine gluconate (MEDLINE KIT)  15 mL Mouth Rinse BID  . Chlorhexidine Gluconate Cloth  6 each Topical Daily  . fentaNYL (SUBLIMAZE) injection  50 mcg Intravenous Once  . guaiFENesin  600 mg Oral BID  . ipratropium-albuterol  3 mL Nebulization Q6H  . mouth rinse  15 mL Mouth Rinse QID  . pantoprazole (PROTONIX) IV  40 mg Intravenous Q12H  . potassium chloride  40 mEq Per Tube Q4H  . sodium chloride flush  10-40 mL Intracatheter Q12H  . sodium chloride flush  3 mL Intravenous Q12H   Continuous Infusions: . sodium chloride 125 mL/hr at 04/22/17 0139  . DOBUTamine 5 mcg/kg/min (04/21/17 2143)  . fentaNYL infusion INTRAVENOUS 75 mcg/hr (04/22/17 0719)  . heparin 600 Units/hr (04/21/17 1745)  . norepinephrine (LEVOPHED) Adult infusion 4 mcg/min (04/22/17 0729)  . piperacillin-tazobactam (ZOSYN)  IV Stopped (04/22/17 3832)  . vancomycin Stopped (04/22/17 0610)   PRN Meds: acetaminophen **OR** acetaminophen, docusate, fentaNYL, midazolam, ondansetron **OR** ondansetron (ZOFRAN) IV, sodium chloride flush   Vital Signs    Vitals:   04/22/17 0700 04/22/17 0715 04/22/17 0730 04/22/17 0745  BP: (!) 69/39 115/77 (!) 119/102 (!) 81/43  Pulse: 64 97 92 68  Resp: 17 19 (!) 21 (!) 25  Temp:      TempSrc:      SpO2: 99% 100% 100% 100%  Weight:      Height:        Intake/Output Summary (Last 24 hours) at 04/22/17 0804 Last data filed at 04/22/17 0700  Gross per 24 hour  Intake          2671.85 ml  Output              670 ml  Net          2001.85 ml   Filed Weights   04/20/17 1537 04/21/17 0405 04/22/17 0500    Weight: 94 lb 6.4 oz (42.8 kg) 103 lb 8 oz (46.9 kg) 100 lb 15.5 oz (45.8 kg)    Telemetry    NSR, sinus brady - PACs and ?Wenkebach  ECG    NA - Personally Reviewed  Physical Exam   GEN: Positive agitation 2nd breathing tube, otherwise not acutely uncomfortable Neck: No  JVD seen but difficult to assess 2nd lines and equipment Cardiac: RRR, no sig R/G, soft murmur Respiratory: bibasilar rales GI: Soft, nontender, non-distended  MS: No  edema; No deformity. Neuro:  Nonfocal, wants to get the tube out Psych: Normal affect   Labs    Chemistry Recent Labs Lab 04/20/17 1050  04/21/17 0138 04/21/17 1357 04/22/17 0343  NA 135  < > 136 144 142  K 4.9  < > 4.1 3.1* 2.7*  CL 101  < > 108 108 111  CO2 18*  --  18* 18* 21*  GLUCOSE 140*  < > 146* 163* 126*  BUN 35*  < > 31* 32* 31*  CREATININE 3.09*  < > 2.64* 2.55* 1.89*  CALCIUM 8.1*  --  5.7* 5.3* 5.6*  PROT 7.0  --  5.1* 4.6*  --  ALBUMIN 2.5*  --  1.7* 1.8*  --   AST 113*  --  142* 195*  --   ALT 48  --  54 71*  --   ALKPHOS 238*  --  168* 142*  --   BILITOT 0.4  --  0.7 0.5  --   GFRNONAA 14*  --  17* 18* 25*  GFRAA 16*  --  20* 20* 29*  ANIONGAP 16*  --  10 18* 10  < > = values in this interval not displayed.   Hematology  Recent Labs Lab 04/21/17 0138 04/21/17 1357 04/22/17 0343  WBC 20.1* 22.2* 22.2*  RBC 3.32* 3.10* 2.92*  HGB 9.1* 8.4* 8.1*  HCT 27.8* 26.1* 24.0*  MCV 83.7 84.2 82.2  MCH 26.8 27.1 27.7  MCHC 32.0 32.2 33.8  RDW 14.1 14.2 13.9  PLT 624* 636* 585*    Cardiac Enzymes  Recent Labs Lab 04/20/17 1340 04/20/17 2004 04/21/17 0138  TROPONINI 2.02* 2.08* 3.82*     Recent Labs Lab 04/20/17 1116  TROPIPOC 2.05*     BNP  Recent Labs Lab 04/21/17 1357  BNP 1,528.1*    Radiology    Ct Abdomen Pelvis Wo Contrast Result Date: 04/20/2017 CLINICAL DATA:  Patient with weakness, nausea and vomiting. EXAM: CT CHEST, ABDOMEN AND PELVIS WITHOUT CONTRAST TECHNIQUE: Multidetector  CT imaging of the chest, abdomen and pelvis was performed following the standard protocol without IV contrast. COMPARISON:  CT chest 01/07/2017 FINDINGS: CT CHEST FINDINGS Cardiovascular: Normal heart size. No pericardial effusion. Aortic atherosclerosis. Coronary arterial atherosclerosis. Mediastinum/Nodes: Interval development of mediastinal adenopathy including a 1.1 cm subcarinal lymph node (image 27; series 3) and a 1.0 cm prevascular lymph node. Additionally there is new fullness within the hila bilaterally most suggestive of hilar adenopathy, poorly visualized without IV contrast. No axillary adenopathy. Small hiatal hernia. Lungs/Pleura: Centrilobular and paraseptal emphysematous changes. Interval development of patchy irregular consolidative opacities throughout the lungs bilaterally with a somewhat basilar predominance. Additionally there is new peribronchial thickening. No pleural effusion or pneumothorax. Musculoskeletal: No aggressive or acute appearing osseous lesions. CT ABDOMEN PELVIS FINDINGS Hepatobiliary: Liver is diffusely low in attenuation compatible with steatosis. Additionally there is suggestion of mild gallbladder wall thickening measuring up to 7 mm (image 66; series 3). There is fat stranding within the porta hepatis and about the pancreas. Small amount of fluid inferior to the right hepatic lobe. Pancreas: Mild parenchymal atrophy. Small amount of surrounding fat stranding. Spleen: Unremarkable Adrenals/Urinary Tract: Adrenal glands are normal. Kidneys are symmetric in size. No hydronephrosis. Urinary bladder is unremarkable. Stomach/Bowel: No abnormal bowel wall thickening or evidence for bowel obstruction. Decompressed sigmoid colon. Sigmoid colonic diverticulosis without evidence for acute diverticulitis. No free intraperitoneal air. Vascular/Lymphatic: Normal caliber abdominal aorta. No retroperitoneal lymphadenopathy. Reproductive: Uterus and adnexal structures are unremarkable.  Other: None. Musculoskeletal: No aggressive or acute appearing osseous lesions. IMPRESSION: Interval development of irregular patchy consolidative opacities throughout the lungs bilaterally concerning for the possibility of multifocal infection. There is fat stranding within the porta hepatis and about the pancreatic parenchyma. Findings are nonspecific in etiology and may be reactive. Sequelae of acalculous cholecystitis, pancreatitis or potentially hepatitis are not excluded. Recommend clinical and laboratory correlation. Hepatic steatosis. These results will be called to the ordering clinician or representative by the Radiologist Assistant, and communication documented in the PACS or zVision Dashboard. Electronically Signed   By: Lovey Newcomer M.D.   On: 04/20/2017 16:03   Ct Chest Wo Contrast Result Date: 04/20/2017 CLINICAL  DATA:  Patient with weakness, nausea and vomiting. EXAM: CT CHEST, ABDOMEN AND PELVIS WITHOUT CONTRAST TECHNIQUE: Multidetector CT imaging of the chest, abdomen and pelvis was performed following the standard protocol without IV contrast. COMPARISON:  CT chest 01/07/2017 FINDINGS: CT CHEST FINDINGS Cardiovascular: Normal heart size. No pericardial effusion. Aortic atherosclerosis. Coronary arterial atherosclerosis. Mediastinum/Nodes: Interval development of mediastinal adenopathy including a 1.1 cm subcarinal lymph node (image 27; series 3) and a 1.0 cm prevascular lymph node. Additionally there is new fullness within the hila bilaterally most suggestive of hilar adenopathy, poorly visualized without IV contrast. No axillary adenopathy. Small hiatal hernia. Lungs/Pleura: Centrilobular and paraseptal emphysematous changes. Interval development of patchy irregular consolidative opacities throughout the lungs bilaterally with a somewhat basilar predominance. Additionally there is new peribronchial thickening. No pleural effusion or pneumothorax. Musculoskeletal: No aggressive or acute appearing  osseous lesions. CT ABDOMEN PELVIS FINDINGS Hepatobiliary: Liver is diffusely low in attenuation compatible with steatosis. Additionally there is suggestion of mild gallbladder wall thickening measuring up to 7 mm (image 66; series 3). There is fat stranding within the porta hepatis and about the pancreas. Small amount of fluid inferior to the right hepatic lobe. Pancreas: Mild parenchymal atrophy. Small amount of surrounding fat stranding. Spleen: Unremarkable Adrenals/Urinary Tract: Adrenal glands are normal. Kidneys are symmetric in size. No hydronephrosis. Urinary bladder is unremarkable. Stomach/Bowel: No abnormal bowel wall thickening or evidence for bowel obstruction. Decompressed sigmoid colon. Sigmoid colonic diverticulosis without evidence for acute diverticulitis. No free intraperitoneal air. Vascular/Lymphatic: Normal caliber abdominal aorta. No retroperitoneal lymphadenopathy. Reproductive: Uterus and adnexal structures are unremarkable. Other: None. Musculoskeletal: No aggressive or acute appearing osseous lesions. IMPRESSION: Interval development of irregular patchy consolidative opacities throughout the lungs bilaterally concerning for the possibility of multifocal infection. There is fat stranding within the porta hepatis and about the pancreatic parenchyma. Findings are nonspecific in etiology and may be reactive. Sequelae of acalculous cholecystitis, pancreatitis or potentially hepatitis are not excluded. Recommend clinical and laboratory correlation. Hepatic steatosis. These results will be called to the ordering clinician or representative by the Radiologist Assistant, and communication documented in the PACS or zVision Dashboard. Electronically Signed   By: Lovey Newcomer M.D.   On: 04/20/2017 16:03   Dg Chest Port 1 View Result Date: 04/22/2017 CLINICAL DATA:  Initial evaluation for acute respiratory failure with hypoxia. EXAM: PORTABLE CHEST 1 VIEW COMPARISON:  Prior radiograph from  04/21/2017. FINDINGS: Patient remains intubated with the tip of the endotracheal tube positioned approximately 2.7 cm above the carina. Left IJ approach centra venous catheter remains in place with tip overlying the distal SVC. Enteric tube courses in the the abdomen. Stable cardiac and mediastinal silhouettes. Lungs mildly hypoinflated. Similar bilateral coarse interstitial opacities. Slightly worsened hazy density within the peripheral right lung base. Suspected small right pleural effusion. No pulmonary edema. No pneumothorax. Osseous structures unchanged. IMPRESSION: 1. Support apparatus in satisfactory position as above. 2. Similar coarse bilateral interstitial opacities, which may reflect sequelae of underlying infection in the correct clinical setting. Slightly increased hazy opacity at the peripheral right lung base as compared to previous. 3. Suspected small right pleural effusion. Electronically Signed   By: Jeannine Boga M.D.   On: 04/22/2017 06:53   Dg Chest Port 1 View Result Date: 04/21/2017 CLINICAL DATA:  Patient status post intubation. Acute respiratory failure. EXAM: PORTABLE CHEST 1 VIEW COMPARISON:  Chest radiograph 04/21/2017 FINDINGS: ET tube terminates in the proximal right mainstem bronchus. Left IJ central venous catheter tip projects over the superior vena cava.  Enteric tube appears coiled overlying the thoracic inlet. Pacer pad overlies the left hemithorax. Monitoring leads overlie the patient. Stable cardiac and mediastinal contours. Grossly unchanged diffuse bilateral coarse interstitial pulmonary opacities. No pleural effusion or pneumothorax. IMPRESSION: ET tube terminates in the proximal right mainstem bronchus, recommend retraction approximately 2.5 cm. Enteric tube appears coiled at the thoracic inlet, recommend repositioning. Similar-appearing diffuse bilateral coarse interstitial pulmonary opacities concerning for pneumonia in the appropriate clinical setting. Followup PA  and lateral chest X-ray is recommended in 3-4 weeks following trial of antibiotic therapy to ensure resolution and exclude underlying malignancy. These results were called by telephone at the time of interpretation on 04/21/2017 at 2:17 pm to Nurse Eilleen Kempf , who verbally acknowledged these results. Electronically Signed   By: Lovey Newcomer M.D.   On: 04/21/2017 14:24   Dg Chest Port 1 View Result Date: 04/21/2017 CLINICAL DATA:  Shortness of breath.  COPD.  Syncope.  Smoker. EXAM: PORTABLE CHEST 1 VIEW COMPARISON:  CT 04/20/2017 plain films 04/20/2017. FINDINGS: External pacer projects over the left side of the chest. Midline trachea. Mild cardiomegaly. Biapical pleural thickening. No pleural effusion or pneumothorax. Since the prior plain film, patchy right lower and left perihilar airspace disease is consistent with developing infection. This is superimposed upon hyperinflation and chronic interstitial thickening of COPD/chronic bronchitis. IMPRESSION: Multifocal airspace opacities, most consistent with infection. Appears progressive since the radiograph of 1 day prior but grossly similar to yesterday's CT. Electronically Signed   By: Abigail Miyamoto M.D.   On: 04/21/2017 12:03   Dg Chest Port 1 View Result Date: 04/20/2017 CLINICAL DATA:  Patient with continued weakness, nausea and vomiting. EXAM: PORTABLE CHEST 1 VIEW COMPARISON:  Chest radiograph 04/14/2017 FINDINGS: Monitoring leads overlie the patient. Normal cardiac and mediastinal contours. Grossly unchanged coarse interstitial pulmonary opacities. Biapical pleuroparenchymal thickening. No pleural effusion or pneumothorax. IMPRESSION: No acute cardiopulmonary process. Chronic interstitial pulmonary opacities. Electronically Signed   By: Lovey Newcomer M.D.   On: 04/20/2017 11:14   Dg Abd Portable 1v Result Date: 04/21/2017 CLINICAL DATA:  Orogastric tube placement EXAM: PORTABLE ABDOMEN - 1 VIEW COMPARISON:  CT abdomen pelvis April 20, 2017 FINDINGS:  Orogastric tube tip and side port are in the stomach. There is no bowel dilatation or air-fluid level to suggest bowel obstruction. No free air. There is aortoiliac atherosclerosis. Visualized lung bases are clear. IMPRESSION: Orogastric tube tip and side port in stomach. No bowel obstruction or free air evident. There is aortoiliac atherosclerosis. Aortic Atherosclerosis (ICD10-I70.0). Electronically Signed   By: Lowella Grip III M.D.   On: 04/21/2017 15:32    Cardiac Studies   Echo 04/21/2017 - Left ventricle: The cavity size was normal. Wall thickness was   normal. Systolic function was mildly reduced. The estimated   ejection fraction was 45%. - Regional wall motion abnormality: Hypokinesis of the basal-mid   anteroseptal, basal-mid inferoseptal, basal-mid inferior, and   apical septal myocardium. - Mitral valve: There was moderate regurgitation. - Right ventricle: Systolic function was mildly reduced. - Tricuspid valve: There was mild regurgitation.  Patient Profile     71 y.o. female who presented with progressive weakness and ongoing abdominal complaints.  In the ED she was hypotensive.  Creat is acutely elevated and lactic acid is elevated.  WBC is 27.7.   Her BP has responded to hydration.  We are consulted secondary to a troponin of 2.05.    Assessment & Plan    HYPOTENSION:  Suspect septic shock.  Now with possible aspiration.  She is intubated. Management per CCM.   ELEVATED TROPONIN:  Likely secondary to her acute illness.  Continuing heparin for now.  Echo with mildly decreased EF, WMA may not have clinical significance. Reassess after recovery from acute illness, family agrees with holding off on ischemic eval until she can be reassessed after recovery.  BRADYCARDIA:  Still with bradycardia at times. No pauses. Continue to follow. Not on rate-lowering rx. HR increases appropriately when she coughs or gets agitated.   Hypokalemia: has total of 80 meq ordered po today.  Consider changing NS 125 cc/hr to LR or IVF w/ K+, will leave to CCM  Signed, Barrett, Rhonda, PA-C  04/22/2017, 8:04 AM    I have seen and examined the patient along with Barrett, Suanne Marker, PA-C.  I have reviewed the chart, notes and new data.  I agree with PA's note.  Key new complaints: intubated, but alert and trying to speak Key examination changes: RRR, no S3, no JVD, no edema, rhonchi bilat. Key new findings / data: reviewed the echo images. While the wall motion abnormalities are definite, they are peculiar, not following typical anatomical coronary distribution.  No further bradycardia is seen. At times, rhythm is suggestive of second degree AV block, Mobitz type I. Currently on dobutamine and norepi IV.  PLAN: Differential diagnosis is CAD with demand myocardial ischemia/small infarction versus takotsubo sd. Repeat echo in a week. If wall motion abnormalities are still present, plan coronary angiography once recovered from the acute illness. If wall motion abnormalities persist, would plan an outpatient nuclear perfusion study. Bradycardia might become an issue again as catecholaminergic drugs are weaned off. Avoid negative chronotropes.  Sanda Klein, MD, [place] [phone] 04/22/2017, 9:38 AM

## 2017-04-22 NOTE — Progress Notes (Signed)
ANTICOAGULATION CONSULT NOTE - Follow Up Consult  Pharmacy Consult for Heparin  Indication: chest pain/ACS  Allergies  Allergen Reactions  . Bee Venom Anaphylaxis and Hives    Patient Measurements: Height: 5' (152.4 cm) Weight: 103 lb 8 oz (46.9 kg) IBW/kg (Calculated) : 45.5  Vital Signs: Temp: 98 F (36.7 C) (07/09 0340) Temp Source: Oral (07/09 0340) BP: 58/41 (07/09 0411) Pulse Rate: 98 (07/09 0411)  Labs:  Recent Labs  04/20/17 1050  04/20/17 1340  04/20/17 2004 04/21/17 0138 04/21/17 1357 04/22/17 0343  HGB 10.9*  < >  --   --   --  9.1* 8.4* 8.1*  HCT 34.0*  < >  --   --   --  27.8* 26.1* 24.0*  PLT 709*  --   --   --   --  624* 636* 585*  LABPROT 14.2  --   --   --   --   --   --   --   INR 1.10  --   --   --   --   --   --   --   HEPARINUNFRC  --   --   --   < > <0.10* 0.14* <0.10* 0.44  CREATININE 3.09*  < >  --   --   --  2.64* 2.55* 1.89*  TROPONINI  --   --  2.02*  --  2.08* 3.82*  --   --   < > = values in this interval not displayed.  Estimated Creatinine Clearance: 19 mL/min (A) (by C-G formula based on SCr of 1.89 mg/dL (H)).  Assessment: Heparin for elevated troponin , heparin level is therapeutic this AM, cardiology following, heparin running at 600 units/hr  Goal of Therapy:  Heparin level 0.3-0.7 units/ml Monitor platelets by anticoagulation protocol: Yes   Plan:  -Cont heparin at 600 units/hr -1200 HL  Narda Bonds 04/22/2017,4:36 AM

## 2017-04-22 NOTE — Progress Notes (Signed)
[facility] Progress Note Patient Name: Alexandra House DOB: [birth year] MRN: [id number]   Date of Service  04/22/2017  HPI/Events of Note    eICU Interventions  Hypokalemia -repleted      Intervention Category Intermediate Interventions: Electrolyte abnormality - evaluation and management  ALVA,RAKESH V. 04/22/2017, 4:39 AM

## 2017-04-22 NOTE — Consult Note (Signed)
Reason for Consult: Sepsis, recent history of nausea and vomiting Referring Physician: CCM  [name] HPI: This is a 71 year old female who is well-known to me for her recent work up of nausea, vomiting, and leukocytosis.  Her symptoms started approximately 3 weeks ago.  She suffered with nausea and vomiting one week before her first visit to the ER.  The work up at that time and the subsequent work up one week later was negative for any clearly identifiable abnormalities with the exception of leukocytosis.  The patient was evaluated in the office on 04/15/2017 in the office and then underwent an EGD the next day, which was normal.  A RUQ U/S was obtained and it was negative for any stones or biliary ductal dilation.  On Thursday I relayed the information to the patient and at that time she seemed stable, even well, however, her husband Ludwig Clarks reports that she under reports her symptoms.  The next day I spoke with her husband as he called late and very concerned.  I encouraged them to present to the ER if she continues to worsen.  She was resistant to going to the ER, but on Saturday AM her husband brought her to the ER again with a subsequent admission.  She was hypotensive and it was not certain if she was in septic shock versus cardiogenic shock.  Currently she is on levophed and dobutamine.  Her urine output is lower, but her creatinine has declined.  There is concern that she has an aspiration pneumonia.    Past Medical History:  Diagnosis Date  . Anxiety   . Carotid stenosis   . Fibromyalgia   . HLD (hyperlipidemia)   . Hx of cardiovascular stress test    a. Lex MV 11/13: + significant ECG changes and CHB with infusion; EF 81%, no ischemia,   . Hx of echocardiogram    a. Echo 12/12: EF 67-54%, grade 2 diastolic dysfunction, mild MR.; b. Echo 11/13:  mild LVH, EF 60-65%  . Neuropathy   . Osteoporosis   . Syncope 08/2009   a. ETT-Echo 1/11:  poor ex tol, submax exercise, no WMA or ECG  changes c/w ischemia;      Past Surgical History:  Procedure Laterality Date  . None      Family History  Problem Relation Age of Onset  . CAD Mother 19       Status post PCI  . Lung cancer Father   . Colon cancer Neg Hx     Social History:  reports that she has been smoking Cigarettes.  She has been smoking about 0.50 packs per day. She uses smokeless tobacco. She reports that she drinks alcohol. She reports that she does not use drugs.  Allergies:  Allergies  Allergen Reactions  . Bee Venom Anaphylaxis and Hives    Medications:  Scheduled: . arformoterol  15 mcg Nebulization BID  . budesonide (PULMICORT) nebulizer solution  0.5 mg Nebulization BID  . chlorhexidine gluconate (MEDLINE KIT)  15 mL Mouth Rinse BID  . Chlorhexidine Gluconate Cloth  6 each Topical Daily  . fentaNYL (SUBLIMAZE) injection  50 mcg Intravenous Once  . guaiFENesin  600 mg Oral BID  . ipratropium-albuterol  3 mL Nebulization Q6H  . mouth rinse  15 mL Mouth Rinse QID  . pantoprazole (PROTONIX) IV  40 mg Intravenous Q12H  . sodium chloride flush  10-40 mL Intracatheter Q12H  . sodium chloride flush  3 mL Intravenous Q12H  Continuous: . sodium chloride 125 mL/hr at 04/22/17 1805  . DOBUTamine 5 mcg/kg/min (04/21/17 2143)  . feeding supplement (VITAL AF 1.2 CAL)    . fentaNYL infusion INTRAVENOUS 125 mcg/hr (04/22/17 1232)  . heparin 600 Units/hr (04/22/17 0700)  . norepinephrine (LEVOPHED) Adult infusion 6 mcg/min (04/22/17 1819)  . piperacillin-tazobactam (ZOSYN)  IV Stopped (04/22/17 1427)  . vancomycin Stopped (04/22/17 9702)    Results for orders placed or performed during the hospital encounter of 04/20/17 (from the past 24 hour(s))  .Cooxemetry Panel (carboxy, met, total hgb, O2 sat)     Status: Abnormal   Collection Time: 04/21/17  7:10 PM  Result Value Ref Range   Total hemoglobin 13.4 12.0 - 16.0 g/dL   O2 Saturation 59.3 %   Carboxyhemoglobin 0.3 (L) 0.5 - 1.5 %   Methemoglobin  1.0 0.0 - 1.5 %  Glucose, capillary     Status: Abnormal   Collection Time: 04/21/17  8:04 PM  Result Value Ref Range   Glucose-Capillary 143 (H) 65 - 99 mg/dL   Comment 1 Notify RN   .Cooxemetry Panel (carboxy, met, total hgb, O2 sat)     Status: Abnormal   Collection Time: 04/21/17 11:20 PM  Result Value Ref Range   Total hemoglobin 8.4 (L) 12.0 - 16.0 g/dL   O2 Saturation 69.3 %   Carboxyhemoglobin 0.7 0.5 - 1.5 %   Methemoglobin 1.2 0.0 - 1.5 %  Vancomycin, random     Status: None   Collection Time: 04/22/17  3:43 AM  Result Value Ref Range   Vancomycin Rm 13   Basic metabolic panel     Status: Abnormal   Collection Time: 04/22/17  3:43 AM  Result Value Ref Range   Sodium 142 135 - 145 mmol/L   Potassium 2.7 (LL) 3.5 - 5.1 mmol/L   Chloride 111 101 - 111 mmol/L   CO2 21 (L) 22 - 32 mmol/L   Glucose, Bld 126 (H) 65 - 99 mg/dL   BUN 31 (H) 6 - 20 mg/dL   Creatinine, Ser 1.89 (H) 0.44 - 1.00 mg/dL   Calcium 5.6 (LL) 8.9 - 10.3 mg/dL   GFR calc non Af Amer 25 (L) >60 mL/min   GFR calc Af Amer 29 (L) >60 mL/min   Anion gap 10 5 - 15  CBC     Status: Abnormal   Collection Time: 04/22/17  3:43 AM  Result Value Ref Range   WBC 22.2 (H) 4.0 - 10.5 K/uL   RBC 2.92 (L) 3.87 - 5.11 MIL/uL   Hemoglobin 8.1 (L) 12.0 - 15.0 g/dL   HCT 24.0 (L) 36.0 - 46.0 %   MCV 82.2 78.0 - 100.0 fL   MCH 27.7 26.0 - 34.0 pg   MCHC 33.8 30.0 - 36.0 g/dL   RDW 13.9 11.5 - 15.5 %   Platelets 585 (H) 150 - 400 K/uL  Heparin level (unfractionated)     Status: None   Collection Time: 04/22/17  3:43 AM  Result Value Ref Range   Heparin Unfractionated 0.44 0.30 - 0.70 IU/mL  Glucose, capillary     Status: Abnormal   Collection Time: 04/22/17  8:08 AM  Result Value Ref Range   Glucose-Capillary 115 (H) 65 - 99 mg/dL   Comment 1 Notify RN   Glucose, capillary     Status: Abnormal   Collection Time: 04/22/17 11:56 AM  Result Value Ref Range   Glucose-Capillary 130 (H) 65 - 99 mg/dL   Comment 1  Capillary Specimen   Heparin level (unfractionated)     Status: Abnormal   Collection Time: 04/22/17 12:10 PM  Result Value Ref Range   Heparin Unfractionated 0.16 (L) 0.30 - 0.70 IU/mL  Glucose, capillary     Status: Abnormal   Collection Time: 04/22/17  3:33 PM  Result Value Ref Range   Glucose-Capillary 127 (H) 65 - 99 mg/dL   Comment 1 Capillary Specimen   Lactic acid, plasma     Status: Abnormal   Collection Time: 04/22/17  5:00 PM  Result Value Ref Range   Lactic Acid, Venous 2.3 (HH) 0.5 - 1.9 mmol/L     Dg Chest Port 1 View  Result Date: 04/22/2017 CLINICAL DATA:  Initial evaluation for acute respiratory failure with hypoxia. EXAM: PORTABLE CHEST 1 VIEW COMPARISON:  Prior radiograph from 04/21/2017. FINDINGS: Patient remains intubated with the tip of the endotracheal tube positioned approximately 2.7 cm above the carina. Left IJ approach centra venous catheter remains in place with tip overlying the distal SVC. Enteric tube courses in the the abdomen. Stable cardiac and mediastinal silhouettes. Lungs mildly hypoinflated. Similar bilateral coarse interstitial opacities. Slightly worsened hazy density within the peripheral right lung base. Suspected small right pleural effusion. No pulmonary edema. No pneumothorax. Osseous structures unchanged. IMPRESSION: 1. Support apparatus in satisfactory position as above. 2. Similar coarse bilateral interstitial opacities, which may reflect sequelae of underlying infection in the correct clinical setting. Slightly increased hazy opacity at the peripheral right lung base as compared to previous. 3. Suspected small right pleural effusion. Electronically Signed   By: Jeannine Boga M.D.   On: 04/22/2017 06:53   Dg Chest Port 1 View  Result Date: 04/21/2017 CLINICAL DATA:  Patient status post intubation. Acute respiratory failure. EXAM: PORTABLE CHEST 1 VIEW COMPARISON:  Chest radiograph 04/21/2017 FINDINGS: ET tube terminates in the proximal right  mainstem bronchus. Left IJ central venous catheter tip projects over the superior vena cava. Enteric tube appears coiled overlying the thoracic inlet. Pacer pad overlies the left hemithorax. Monitoring leads overlie the patient. Stable cardiac and mediastinal contours. Grossly unchanged diffuse bilateral coarse interstitial pulmonary opacities. No pleural effusion or pneumothorax. IMPRESSION: ET tube terminates in the proximal right mainstem bronchus, recommend retraction approximately 2.5 cm. Enteric tube appears coiled at the thoracic inlet, recommend repositioning. Similar-appearing diffuse bilateral coarse interstitial pulmonary opacities concerning for pneumonia in the appropriate clinical setting. Followup PA and lateral chest X-ray is recommended in 3-4 weeks following trial of antibiotic therapy to ensure resolution and exclude underlying malignancy. These results were called by telephone at the time of interpretation on 04/21/2017 at 2:17 pm to Nurse Eilleen Kempf , who verbally acknowledged these results. Electronically Signed   By: Lovey Newcomer M.D.   On: 04/21/2017 14:24   Dg Chest Port 1 View  Result Date: 04/21/2017 CLINICAL DATA:  Shortness of breath.  COPD.  Syncope.  Smoker. EXAM: PORTABLE CHEST 1 VIEW COMPARISON:  CT 04/20/2017 plain films 04/20/2017. FINDINGS: External pacer projects over the left side of the chest. Midline trachea. Mild cardiomegaly. Biapical pleural thickening. No pleural effusion or pneumothorax. Since the prior plain film, patchy right lower and left perihilar airspace disease is consistent with developing infection. This is superimposed upon hyperinflation and chronic interstitial thickening of COPD/chronic bronchitis. IMPRESSION: Multifocal airspace opacities, most consistent with infection. Appears progressive since the radiograph of 1 day prior but grossly similar to yesterday's CT. Electronically Signed   By: Abigail Miyamoto M.D.   On: 04/21/2017 12:03  Dg Abd  Portable 1v  Result Date: 04/21/2017 CLINICAL DATA:  Orogastric tube placement EXAM: PORTABLE ABDOMEN - 1 VIEW COMPARISON:  CT abdomen pelvis April 20, 2017 FINDINGS: Orogastric tube tip and side port are in the stomach. There is no bowel dilatation or air-fluid level to suggest bowel obstruction. No free air. There is aortoiliac atherosclerosis. Visualized lung bases are clear. IMPRESSION: Orogastric tube tip and side port in stomach. No bowel obstruction or free air evident. There is aortoiliac atherosclerosis. Aortic Atherosclerosis (ICD10-I70.0). Electronically Signed   By: Lowella Grip III M.D.   On: 04/21/2017 15:32    ROS:  As stated above in the HPI otherwise negative.  Blood pressure 113/61, pulse 67, temperature (!) 97.3 F (36.3 C), temperature source Axillary, resp. rate (!) 23, height 4' 10" (1.473 m), weight 45.8 kg (100 lb 15.5 oz), SpO2 100 %.    PE: Gen: NAD, mild to moderately sedated, restless HEENT:  [place]/AT, EOMI Lungs: CTA Bilaterally CV: RRR without M/G/R ABM: Soft, NTND, +BS Ext: edematous upper extremities  Assessment/Plan: 1) Sepsis versus cardiogenic shock. 2) Leukocytosis. 3) Recent history of nausea and vomiting. 4) Respiratory failure.   Currently I am unable to assess her GI issues, however, these are secondary to her current critical state.  I am very familiar with her husband, who is my patient, and requested that I see her in the office last week  I will continue provide GI support during this admission.  Hopefully she will improve from the critical care standpoint and I can assess her GI symptoms better at that juncture.    Plan: 1) Continue supportive care per CCM and Cardiology.  Matea Stanard D 04/22/2017, 6:57 PM

## 2017-04-22 NOTE — Progress Notes (Signed)
Initial Nutrition Assessment  DOCUMENTATION CODES:   Non-severe (moderate) malnutrition in context of chronic illness  INTERVENTION:    Vital AF 1.2 at 40 ml/h (960 ml per day)   Provides 1152 kcal, 72 gm protein, 779 ml free water daily  NUTRITION DIAGNOSIS:   Malnutrition (moderate) related to chronic illness (smoker, neuropathy, osteoporosis, CAD) as evidenced by mild depletion of body fat, mild depletion of muscle mass, percent weight loss (12% weight loss within one month).  GOAL:   Patient will meet greater than or equal to 90% of their needs  MONITOR:   Vent status, TF tolerance, Labs, I & O's  REASON FOR ASSESSMENT:   Consult Enteral/tube feeding initiation and management  ASSESSMENT:   71 yo female with PMH of fibromyalgia, HLD, neuropathy, osteoporosis, anxiety, carotid stenosis, who was admitted on 7/7 with progressive metabolic acidosis, acute respiratory failure, requiring intubation.   Discussed patient in ICU rounds and with RN today. Received MD Consult for TF initiation and management. Nutrition-Focused physical exam completed. Findings are mild-moderate fat depletion, mild-moderate muscle depletion, and mild edema.  Patient is currently intubated on ventilator support MV: 11.3 L/min Temp (24hrs), Avg:97.7 F (36.5 C), Min:97.3 F (36.3 C), Max:98.2 F (36.8 C)   Labs reviewed: potassium 2.7 (L) CBG's: 115-130 Medications reviewed.  Per discussion with patient's husband and family, patient "eats like a bird." She recently had a GI illness and lost ~10 lbs in the past month. 12% weight loss within the past month is significant. Current weight is above usual weight due to fluid overload.  Diet Order:  Diet NPO time specified  Skin:  Reviewed, no issues  Last BM:  7/8  Height:   Ht Readings from Last 1 Encounters:  04/22/17 4\' 10"  (1.473 m)    Weight:   Wt Readings from Last 1 Encounters:  04/22/17 100 lb 15.5 oz (45.8 kg)    Ideal  Body Weight:  43.9 kg  BMI:  Body mass index is 21.1 kg/m.  Estimated Nutritional Needs:   Kcal:  1141  Protein:  65-75 gm  Fluid:  1.3-1.5 L  EDUCATION NEEDS:   No education needs identified at this time  Molli Barrows, [place], [place], [place] Pager [phone] After Hours Pager [phone]

## 2017-04-22 NOTE — Progress Notes (Signed)
ANTICOAGULATION CONSULT NOTE - Follow Up Consult  Pharmacy Consult for heparin Indication: chest pain/ACS  Allergies  Allergen Reactions  . Bee Venom Anaphylaxis and Hives    Patient Measurements: Height: 5' (152.4 cm) Weight: 100 lb 15.5 oz (45.8 kg) IBW/kg (Calculated) : 45.5 Heparin Dosing Weight: 43 kg  Vital Signs: Temp: 97.9 F (36.6 C) (07/09 0810) Temp Source: Oral (07/09 0810) BP: 127/65 (07/09 0815) Pulse Rate: 106 (07/09 0815)  Labs:  Recent Labs  04/20/17 1050  04/20/17 1340  04/20/17 2004 04/21/17 0138 04/21/17 1357 04/22/17 0343  HGB 10.9*  < >  --   --   --  9.1* 8.4* 8.1*  HCT 34.0*  < >  --   --   --  27.8* 26.1* 24.0*  PLT 709*  --   --   --   --  624* 636* 585*  LABPROT 14.2  --   --   --   --   --   --   --   INR 1.10  --   --   --   --   --   --   --   HEPARINUNFRC  --   --   --   < > <0.10* 0.14* <0.10* 0.44  CREATININE 3.09*  < >  --   --   --  2.64* 2.55* 1.89*  TROPONINI  --   --  2.02*  --  2.08* 3.82*  --   --   < > = values in this interval not displayed.  Estimated Creatinine Clearance: 19 mL/min (A) (by C-G formula based on SCr of 1.89 mg/dL (H)).  Assessment: CC/HPI: 71 yo F presenting to ED w/ persistent N/V and weakness, sepsis r/o   PMH: anxiety, carotid stenosis, fibromyalgia, HLD, neuropathy, osteoporosis, syncope  Anticoag: none pta on hep gtt 600 units/hr with lvl 0.44  Renal: AKI SCr 1.89, K 2.7, Ca 5.6 (co 7.4)  Heme/Onc: H&H 8.1/24, Plt 505  Goal of Therapy:  Heparin level 0.3-0.7 units/ml Monitor platelets by anticoagulation protocol: Yes   Plan:  Heparin 600 units/hrs Daily HL, CBC  Levester Fresh, PharmD, BCPS, BCCCP Clinical Pharmacist Clinical phone for 04/22/2017 from 7a-3:30p: [phone] If after 3:30p, please call main pharmacy at: [phone] 04/22/2017 8:20 AM

## 2017-04-22 NOTE — Care Management Note (Signed)
Case Management Note  Patient Details  Name: Alexandra House MRN: [id number] Date of Birth: December 20, 1945  Subjective/Objective:   Pt admitted with AMS                 Action/Plan:   PTA from home with husband.  Pt is intubated   Expected Discharge Date:                  Expected Discharge Plan:     In-House Referral:     Discharge planning Services  CM Consult  Post Acute Care Choice:    Choice offered to:     DME Arranged:    DME Agency:     HH Arranged:    HH Agency:     Status of Service:     If discussed at [employer], dates discussed:    Additional Comments:  Maryclare Labrador, RN 04/22/2017, 4:41 PM

## 2017-04-23 ENCOUNTER — Inpatient Hospital Stay (HOSPITAL_COMMUNITY): Payer: Medicare HMO

## 2017-04-23 DIAGNOSIS — I441 Atrioventricular block, second degree: Secondary | ICD-10-CM

## 2017-04-23 LAB — GLUCOSE, CAPILLARY
GLUCOSE-CAPILLARY: 118 mg/dL — AB (ref 65–99)
GLUCOSE-CAPILLARY: 136 mg/dL — AB (ref 65–99)
GLUCOSE-CAPILLARY: 139 mg/dL — AB (ref 65–99)
GLUCOSE-CAPILLARY: 142 mg/dL — AB (ref 65–99)
Glucose-Capillary: 134 mg/dL — ABNORMAL HIGH (ref 65–99)
Glucose-Capillary: 107 mg/dL — ABNORMAL HIGH (ref 65–99)

## 2017-04-23 LAB — BASIC METABOLIC PANEL
Anion gap: 9 (ref 5–15)
BUN: 34 mg/dL — AB (ref 6–20)
CALCIUM: 5.6 mg/dL — AB (ref 8.9–10.3)
CO2: 18 mmol/L — ABNORMAL LOW (ref 22–32)
CREATININE: 1.5 mg/dL — AB (ref 0.44–1.00)
Chloride: 115 mmol/L — ABNORMAL HIGH (ref 101–111)
GFR, EST AFRICAN AMERICAN: 39 mL/min — AB (ref >60)
GFR, EST NON AFRICAN AMERICAN: 33 mL/min — AB (ref >60)
Glucose, Bld: 152 mg/dL — ABNORMAL HIGH (ref 65–99)
Potassium: 3.8 mmol/L (ref 3.5–5.1)
SODIUM: 142 mmol/L (ref 135–145)

## 2017-04-23 LAB — HEPATIC FUNCTION PANEL
ALBUMIN: 1.7 g/dL — AB (ref 3.5–5.0)
ALK PHOS: 155 U/L — AB (ref 38–126)
ALT: 76 U/L — AB (ref 14–54)
AST: 70 U/L — ABNORMAL HIGH (ref 15–41)
Bilirubin, Direct: <0.1 mg/dL — ABNORMAL LOW (ref 0.1–0.5)
TOTAL PROTEIN: 4.8 g/dL — AB (ref 6.5–8.1)
Total Bilirubin: 0.2 mg/dL — ABNORMAL LOW (ref 0.3–1.2)

## 2017-04-23 LAB — HEPARIN LEVEL (UNFRACTIONATED)
Heparin Unfractionated: 0.26 IU/mL — ABNORMAL LOW (ref 0.30–0.70)
Heparin Unfractionated: 0.3 IU/mL (ref 0.30–0.70)

## 2017-04-23 LAB — CBC
HCT: 27.4 % — ABNORMAL LOW (ref 36.0–46.0)
HEMOGLOBIN: 8.7 g/dL — AB (ref 12.0–15.0)
MCH: 27.2 pg (ref 26.0–34.0)
MCHC: 31.8 g/dL (ref 30.0–36.0)
MCV: 85.6 fL (ref 78.0–100.0)
PLATELETS: 542 10*3/uL — AB (ref 150–400)
RBC: 3.2 MIL/uL — AB (ref 3.87–5.11)
RDW: 14.4 % (ref 11.5–15.5)
WBC: 38.7 10*3/uL — AB (ref 4.0–10.5)

## 2017-04-23 LAB — LIPASE, BLOOD: LIPASE: 25 U/L (ref 11–51)

## 2017-04-23 LAB — COOXEMETRY PANEL
Carboxyhemoglobin: 0.5 % (ref 0.5–1.5)
Methemoglobin: 1.4 % (ref 0.0–1.5)
O2 SAT: 53.3 %
Total hemoglobin: 8 g/dL — ABNORMAL LOW (ref 12.0–16.0)

## 2017-04-23 LAB — APTT: APTT: 68 s — AB (ref 24–36)

## 2017-04-23 LAB — PROCALCITONIN: Procalcitonin: 0.72 ng/mL

## 2017-04-23 LAB — TROPONIN I
TROPONIN I: 4.04 ng/mL — AB (ref <0.03)
Troponin I: 4.39 ng/mL (ref <0.03)

## 2017-04-23 LAB — AMYLASE: Amylase: 38 U/L (ref 28–100)

## 2017-04-23 MED ORDER — CALCIUM GLUCONATE 10 % IV SOLN
1.0000 g | Freq: Once | INTRAVENOUS | Status: AC
  Administered 2017-04-23: 1 g via INTRAVENOUS
  Filled 2017-04-23: qty 10

## 2017-04-23 MED ORDER — ASPIRIN 325 MG PO TABS
325.0000 mg | ORAL_TABLET | Freq: Every day | ORAL | Status: DC
  Administered 2017-04-23 – 2017-05-02 (×10): 325 mg
  Filled 2017-04-23 (×11): qty 1

## 2017-04-23 NOTE — Progress Notes (Signed)
PULMONARY / CRITICAL CARE MEDICINE   Name: Alexandra House MRN: [id number] DOB: Feb 26, 1946    ADMISSION DATE:  04/20/2017 REFERRING MD:Dr. Aileen House   CHIEF COMPLAINT:AMS, Hypotension, Hypothermia, Bradycardia   HISTORY OF PRESENT ILLNESS:   [name] who initially presented with bradycardia in the 30's, hypotension (79/39), tachypnea, hypothermic. She was treated with IVF with improvement in BP.  The patient was admitted per [facility] and treated for suspected sepsis.  She developed worsening hypotension, bradycardia, altered mental status and increased work of breathing on 7/8.  PCCM called back for re-evaluation. Currently intubated and on pressors.   SUBJECTIVE:  No acute overnight events. Levophed up to 69mg this AM and continuing dobutamine.  VITAL SIGNS: BP 130/61   Pulse 90   Temp (!) 97.4 F (36.3 C) (Oral) Comment: Brandy, RN notified  Resp (!) 27   Ht _0  (1.473 m)   Wt 112 lb 7 oz (51 kg)   SpO2 100%   BMI 23.50 kg/m   HEMODYNAMICS: CVP:  [9 mmHg-15 mmHg] 15 mmHg  VENTILATOR SETTINGS: Vent Mode: PRVC FiO2 (%):  [40 %] 40 % Set Rate:  [30 bmp] 30 bmp Vt Set:  [360 mL] 360 mL PEEP:  [5 cmH20] 5 cmH20 Pressure Support:  [12 cmH20] 12 cmH20 Plateau Pressure:  [17 cmH20-19 cmH20] 17 cmH20  INTAKE / OUTPUT: I/O last 3 completed shifts: In: 7275.4 [I.V.:5863.4; Other:110; NG/GT:942; IV Piggyback:360] Out: 773 [Urine:773]  PHYSICAL EXAMINATION: General:  [name] intubated sitting up in bed appearing comfortable and in NAD Neuro: alert, responds to commands HEENT:  AT/[place], MMM, PERRL Cardiovascular:  RRR, no MRG Lungs:  CTABL Abdomen:  +BS, soft, NTND Musculoskeletal:  No gross deformities Extremities: trace edema  LABS:  BMET  Recent Labs Lab 04/21/17 0138 04/21/17 1357 04/22/17 0343  NA 136 144 142  K 4.1 3.1* 2.7*  CL 108 108 111  CO2 18* 18* 21*  BUN 31* 32* 31*  CREATININE 2.64* 2.55* 1.89*  GLUCOSE 146* 163* 126*     Electrolytes  Recent Labs Lab 04/20/17 1340 04/20/17 2004 04/21/17 0138 04/21/17 1357 04/22/17 0343  CALCIUM  --   --  5.7* 5.3* 5.6*  MG 1.8 1.9  --   --   --   PHOS 5.2* 4.7*  --   --   --     CBC  Recent Labs Lab 04/21/17 0138 04/21/17 1357 04/22/17 0343  WBC 20.1* 22.2* 22.2*  HGB 9.1* 8.4* 8.1*  HCT 27.8* 26.1* 24.0*  PLT 624* 636* 585*    Coag's  Recent Labs Lab 04/20/17 1050  INR 1.10    Sepsis Markers  Recent Labs Lab 04/20/17 1340  04/21/17 1357 04/21/17 1755 04/22/17 1700  LATICACIDVEN  --   < > 9.7* 6.4* 2.3*  PROCALCITON 1.07  --   --   --   --   < > = values in this interval not displayed.  ABG  Recent Labs Lab 04/21/17 1220 04/21/17 1557  PHART 7.331* 7.432  PCO2ART BELOW REPORTABLE RANGE 26.2*  PO2ART 121.00* 158*    Liver Enzymes  Recent Labs Lab 04/20/17 1050 04/21/17 0138 04/21/17 1357  AST 113* 142* 195*  ALT 48 54 71*  ALKPHOS 238* 168* 142*  BILITOT 0.4 0.7 0.5  ALBUMIN 2.5* 1.7* 1.8*    Cardiac Enzymes  Recent Labs Lab 04/20/17 1340 04/20/17 2004 04/21/17 0138  TROPONINI 2.02* 2.08* 3.82*    Glucose  Recent Labs Lab 04/22/17 0808 04/22/17 1156 04/22/17 1533  04/22/17 1936 04/22/17 2332 04/23/17 0323  GLUCAP 115* 130* 127* 143* 138* 139*    Imaging No results found.   STUDIES: 7/05 [place] ABD >>abdominal US on 7/5 which was negative for gallstones, hepatic abnormality, normal pancreas &no hydronephrosis.  7/7 CT chest/abdomen/pelvis> emphysema, mult-lobar pneumonia, fatty liver, fat stranding around head of pancreas  CULTURES: BCx2 7/7 >> UC 7/7 >> resp culture 7/8 > Ucx: 7/7>> negative  ANTIBIOTICS: Zosyn 7/7 >> Vanco 7/7 >>  SIGNIFICANT EVENTS: 7/07 Admit with N/V, weakness, hypotension  7/8 >> intubated  LINES/TUBES: ETT 7/8 >> CVC LIJ 7/8 >> PIV x2 LA forearm, right wrist 7/8>>  DISCUSSION: 71 y/o female with a past medical history of osteoporosis  treated with prolia was admitted with several months of nausea, vomiting which lead to dyspnea related to aspiration pneumonia and septic shock. Now with progressive metabolic acidosis, acute respiratory failure and likely demand ischemia. DDx of shock 7/8 is septic shock vs cardiogenic shock.   ASSESSMENT / PLAN:  PULMONARY A: Acute Respiratory Failure with hypoxemia due to aspiration pneumonia  Centrilobular Emphysema  P: Full mechanical vent support  VAP prevention Daily WUA/SBT Start brovana/pulmicort, mucinex Start duoneb  CARDIOVASCULAR A:  Bradycardia  Elevated Troponin - suspect demand ischemia, 2.02>2.08>3.8 Hx Grade II Diastolic Dysfunction, LVEF Hx Carotid Stenosis  Shock: presumably septic vs cardiogenic P: Continue heparin Check probnp Levophed titrated to MAP > 65, MAP 81 Check CVP: 15 tele  RENAL A:  AKI Non-gap metabolic acidosis Hypokalemia, resolved Hypocalcemia: 5.6 s/p calcium gluconate  P: Monitor BMET and UOP Replace electrolytes as needed; KCl 76mQ per tube q4hrs Calcium gluconate  GASTROINTESTINAL A:  Nausea / Vomiting, Weight Loss - ? If related to Prolia  Elevated alk phos without evidence of biliary obstruction P: NPO / OGT Pepcid for SUP  Consider tube feedings 7/9 GI following; no recommendations at this time.   HEMATOLOGIC A:  Anemia, stable. Hgb 8.7 (8.1) P: Monitor for bleeding Transfusion threshold <8  INFECTIOUS A:  Aspiration pneumonia P: F/u resp culture Continue zosyn Cont vanc  ENDOCRINE A:  Hyperglycemia  P: SSI  NEUROLOGIC A:  Mild confusion/acute encephalopathy due to sepsis/shock P: RASS goal: -1 Fentanyl gtt Versed prn SBT daily ativan 167mBID PRN    FAMILY - Updates: updated husband and son at bedside at length  - Inter-disciplinary family meet or Palliative Care meeting due by: day 7   Pulmonary and [place]: [phone]/07/2017, 7:24 AM

## 2017-04-23 NOTE — Progress Notes (Signed)
Progress Note  Patient Name: DONNICE NIELSEN Date of Encounter: 04/23/2017  Primary Cardiologist:   Dr. Stanford Breed  Subjective   Denies chest pain.  Awake on the vent. Responds to verbal. Son at bedside.  Inpatient Medications    Scheduled Meds: . arformoterol  15 mcg Nebulization BID  . budesonide (PULMICORT) nebulizer solution  0.5 mg Nebulization BID  . chlorhexidine gluconate (MEDLINE KIT)  15 mL Mouth Rinse BID  . Chlorhexidine Gluconate Cloth  6 each Topical Daily  . fentaNYL (SUBLIMAZE) injection  50 mcg Intravenous Once  . guaiFENesin  600 mg Oral BID  . ipratropium-albuterol  3 mL Nebulization Q6H  . mouth rinse  15 mL Mouth Rinse QID  . pantoprazole (PROTONIX) IV  40 mg Intravenous Q12H  . potassium chloride  40 mEq Per Tube Q4H  . sodium chloride flush  10-40 mL Intracatheter Q12H  . sodium chloride flush  3 mL Intravenous Q12H   Continuous Infusions: . sodium chloride 125 mL/hr at 04/23/17 0200  . DOBUTamine 5 mcg/kg/min (04/23/17 0800)  . feeding supplement (VITAL AF 1.2 CAL) 1,000 mL (04/23/17 0602)  . fentaNYL infusion INTRAVENOUS Stopped (04/23/17 0829)  . heparin 700 Units/hr (04/23/17 4917)  . norepinephrine (LEVOPHED) Adult infusion 11 mcg/min (04/23/17 0829)  . piperacillin-tazobactam (ZOSYN)  IV Stopped (04/23/17 0612)  . vancomycin Stopped (04/22/17 0610)   PRN Meds: acetaminophen **OR** acetaminophen, docusate, fentaNYL, LORazepam, midazolam, ondansetron **OR** ondansetron (ZOFRAN) IV, sodium chloride flush   Vital Signs    Vitals:   04/23/17 0645 04/23/17 0700 04/23/17 0800 04/23/17 0817  BP: 127/64 130/61 (!) 80/40 (!) 94/45  Pulse: 86 90 79 71  Resp: (!) 32 (!) _0 Temp:   97.9 F (36.6 C)   TempSrc:   Oral   SpO2: 100% 100% 100% 100%  Weight:      Height:        Intake/Output Summary (Last 24 hours) at 04/23/17 0834 Last data filed at 04/23/17 0800  Gross per 24 hour  Intake          5292.38 ml  Output              298 ml    Net          4994.38 ml   Filed Weights   04/21/17 0405 04/22/17 0500 04/23/17 0446  Weight: 103 lb 8 oz (46.9 kg) 100 lb 15.5 oz (45.8 kg) 112 lb 7 oz (51 kg)    Telemetry    NSR, sinus brady - PACs and Wenkebach overnight, prolonged between 10 pm and midnight  ECG    NA - Personally Reviewed  Physical Exam   GEN: Tolerating breathing tube pretty well today, not quite ready for extubation, not acutely uncomfortable Neck: JVD elevated on the R, Lines and equipment on the L Cardiac: RRR, no sig R/G, soft murmur Respiratory: bibasilar rales GI: Soft, nontender, non-distended  MS: 2+ LE edema; No deformity. Neuro:  Nonfocal, wants to get the tube out Psych: Normal affect   Labs    Chemistry Recent Labs Lab 04/20/17 1050  04/21/17 0138 04/21/17 1357 04/22/17 0343 04/23/17 0745  NA 135  < > 136 144 142 142  K 4.9  < > 4.1 3.1* 2.7* 3.8  CL 101  < > 108 108 111 115*  CO2 18*  --  18* 18* 21* 18*  GLUCOSE 140*  < > 146* 163* 126* 152*  BUN 35*  < > 31* 32* 31* 34*  CREATININE 3.09*  < > 2.64* 2.55* 1.89* 1.50*  CALCIUM 8.1*  --  5.7* 5.3* 5.6* 5.6*  PROT 7.0  --  5.1* 4.6*  --   --   ALBUMIN 2.5*  --  1.7* 1.8*  --   --   AST 113*  --  142* 195*  --   --   ALT 48  --  54 71*  --   --   ALKPHOS 238*  --  168* 142*  --   --   BILITOT 0.4  --  0.7 0.5  --   --   GFRNONAA 14*  --  17* 18* 25* 33*  GFRAA 16*  --  20* 20* 29* 39*  ANIONGAP 16*  --  10 18* 10 9  < > = values in this interval not displayed.   Hematology  Recent Labs Lab 04/21/17 1357 04/22/17 0343 04/23/17 0745  WBC 22.2* 22.2* 38.7*  RBC 3.10* 2.92* 3.20*  HGB 8.4* 8.1* 8.7*  HCT 26.1* 24.0* 27.4*  MCV 84.2 82.2 85.6  MCH 27.1 27.7 27.2  MCHC 32.2 33.8 31.8  RDW 14.2 13.9 14.4  PLT 636* 585* 542*    Cardiac Enzymes  Recent Labs Lab 04/20/17 1340 04/20/17 2004 04/21/17 0138  TROPONINI 2.02* 2.08* 3.82*     Recent Labs Lab 04/20/17 1116  TROPIPOC 2.05*     BNP  Recent  Labs Lab 04/21/17 1357  BNP 1,528.1*    Radiology    Dg Chest Port 1 View - no CXR 07/10 Result Date: 04/22/2017 CLINICAL DATA:  Initial evaluation for acute respiratory failure with hypoxia. EXAM: PORTABLE CHEST 1 VIEW COMPARISON:  Prior radiograph from 04/21/2017. FINDINGS: Patient remains intubated with the tip of the endotracheal tube positioned approximately 2.7 cm above the carina. Left IJ approach centra venous catheter remains in place with tip overlying the distal SVC. Enteric tube courses in the the abdomen. Stable cardiac and mediastinal silhouettes. Lungs mildly hypoinflated. Similar bilateral coarse interstitial opacities. Slightly worsened hazy density within the peripheral right lung base. Suspected small right pleural effusion. No pulmonary edema. No pneumothorax. Osseous structures unchanged. IMPRESSION: 1. Support apparatus in satisfactory position as above. 2. Similar coarse bilateral interstitial opacities, which may reflect sequelae of underlying infection in the correct clinical setting. Slightly increased hazy opacity at the peripheral right lung base as compared to previous. 3. Suspected small right pleural effusion. Electronically Signed   By: Benjamin  McClintock M.D.   On: 04/22/2017 06:53   Cardiac Studies   Echo 04/21/2017 - Left ventricle: The cavity size was normal. Wall thickness was   normal. Systolic function was mildly reduced. The estimated   ejection fraction was 45%. - Regional wall motion abnormality: Hypokinesis of the basal-mid   anteroseptal, basal-mid inferoseptal, basal-mid inferior, and   apical septal myocardium. - Mitral valve: There was moderate regurgitation. - Right ventricle: Systolic function was mildly reduced. - Tricuspid valve: There was mild regurgitation.  Patient Profile     71 y.o. female who presented with progressive weakness and ongoing abdominal complaints.  In the ED she was hypotensive.  Creat is acutely elevated and lactic acid  is elevated.  WBC is 27.7.   Her BP has responded to hydration.  We are consulted secondary to a troponin of 2.05.    Assessment & Plan    HYPOTENSION:  Suspect septic shock.  Now with possible aspiration.  She is intubated. Management per CCM. Increased Levo from yesterday, not ready for extubation. - still   on IVF NS at 125 cc/hr, I/O + by 11.9 L since admission, volume overload on exam. Diuretics per MDs  ELEVATED TROPONIN:  Likely secondary to her acute illness.   - Has been on heparin since admission, MD advise on duration of therapy.   - Echo with mildly decreased EF, WMA may not have clinical significance. Reassess after recovery from acute illness, family agrees with holding off on ischemic eval until she can be reassessed after recovery.  BRADYCARDIA:  Still with bradycardia 2nd Wenkebach at times. No pauses > 2 sec. Continue to follow. Not on rate-lowering rx. HR increases appropriately when she coughs or gets agitated.   Hypokalemia: treated and improved  Signed, Barrett, Rhonda, PA-C  04/23/2017, 8:34 AM    I have seen and examined the patient along with Barrett, Rhonda, PA-C.  I have reviewed the chart, notes and new data.  I agree with PA's note.  PLAN: Differential diagnosis is CAD with demand myocardial ischemia/small infarction versus takotsubo sd. Repeat echo in a week. If wall motion abnormalities are still present, plan coronary angiography once recovered from the acute illness. If wall motion abnormalities persist, would plan an outpatient nuclear perfusion study. Avoid negative chronotropes.  Mihai Croitoru, MD, FACC [facility] [phone] 04/23/2017, 8:34 AM     

## 2017-04-23 NOTE — Progress Notes (Signed)
Patient has no secretions at this time for a sputum sample. RT will continue to try.

## 2017-04-23 NOTE — Progress Notes (Signed)
ANTICOAGULATION CONSULT NOTE - Follow Up Consult  Pharmacy Consult for heparin Indication: chest pain/ACS  Allergies  Allergen Reactions  . Bee Venom Anaphylaxis and Hives    Patient Measurements: Height: 4\' 10"  (147.3 cm) Weight: 112 lb 7 oz (51 kg) IBW/kg (Calculated) : 40.9 Heparin Dosing Weight: 43 kg  Vital Signs: Temp: 97.9 F (36.6 C) (07/10 1140) Temp Source: Oral (07/10 1140) BP: 103/64 (07/10 1200) Pulse Rate: 71 (07/10 1200)  Labs:  Recent Labs  04/20/17 1340  04/20/17 2004  04/21/17 0138 04/21/17 1357 04/22/17 0343 04/22/17 1210 04/23/17 0201 04/23/17 0745 04/23/17 1046  HGB  --   --   --   < > 9.1* 8.4* 8.1*  --   --  8.7*  --   HCT  --   --   --   < > 27.8* 26.1* 24.0*  --   --  27.4*  --   PLT  --   --   --   < > 624* 636* 585*  --   --  542*  --   APTT  --   --   --   --   --   --   --   --   --  68*  --   HEPARINUNFRC  --   < > <0.10*  --  0.14* <0.10* 0.44 0.16* 0.26*  --  0.30  CREATININE  --   --   --   < > 2.64* 2.55* 1.89*  --   --  1.50*  --   TROPONINI 2.02*  --  2.08*  --  3.82*  --   --   --   --   --   --   < > = values in this interval not displayed.  Estimated Creatinine Clearance: 23.7 mL/min (A) (by C-G formula based on SCr of 1.5 mg/dL (H)).  Assessment: CC/HPI: 71 yo F presenting to ED w/ persistent N/V and weakness, sepsis r/o   PMH: anxiety, carotid stenosis, fibromyalgia, HLD, neuropathy, osteoporosis, syncope  Anticoag: none pta on hep gtt 700 units/hr with lvl 0.3  Renal: AKI SCr 1.5, Ca 5.6  Heme/Onc: H&H 8.7/27.4, Plt 542  Goal of Therapy:  Heparin level 0.3-0.7 units/ml Monitor platelets by anticoagulation protocol: Yes   Plan:  Heparin 700 units/hrs Daily HL, CBC  Levester Fresh, PharmD, BCPS, BCCCP Clinical Pharmacist Clinical phone for 04/23/2017 from 7a-3:30p: [phone] If after 3:30p, please call main pharmacy at: [phone] 04/23/2017 12:22 PM

## 2017-04-23 NOTE — Progress Notes (Signed)
Sputum collected and sent to lab 

## 2017-04-23 NOTE — Progress Notes (Signed)
[place] Progress Note Patient Name: Alexandra House DOB: 1945/11/26 MRN: [id number]   Date of Service  04/23/2017  HPI/Events of Note  Trop I 4.39 (slightly up from previously). Pt is on DBA and NE infusions. Has been on heparin gtt since admission. Not on ASA. EKG from 07/08 reviewed. Cardiology has seen patient. There will be little further that they can add presently  eICU Interventions  Continue resuscitation, vasopressors, inotrope Continue heparin Add ASA Repeat trop I and EKG in AM 07/11     Intervention Category Major Interventions: Other:  Wilhelmina Mcardle 04/23/2017, 6:23 PM

## 2017-04-23 NOTE — Progress Notes (Signed)
Subjective: No acute events.  Still on vent.  No significant change.  Objective: Vital signs in last 24 hours: Temp:  [97.4 F (36.3 C)-98.7 F (37.1 C)] 97.9 F (36.6 C) (07/10 1538) Pulse Rate:  [48-90] 62 (07/10 1500) Resp:  [0-34] 0 (07/10 1500) BP: (59-154)/(35-106) 74/41 (07/10 1500) SpO2:  [98 %-100 %] 99 % (07/10 1516) FiO2 (%):  [30 %-40 %] 30 % (07/10 1516) Weight:  [51 kg (112 lb 7 oz)] 51 kg (112 lb 7 oz) (07/10 0446) Last BM Date: 04/21/17  Intake/Output from previous day: 07/09 0701 - 07/10 0700 In: 5239 [I.V.:4137; NG/GT:942; IV Piggyback:160] Out: 268 [Urine:268] Intake/Output this shift: Total I/O In: 1603.3 [I.V.:1213.3; NG/GT:280; IV Piggyback:110] Out: 185 [Urine:185]  General appearance: semi-conscious, responsive Resp: clear to auscultation bilaterally Cardio: regular rate and rhythm GI: soft, non-tender; bowel sounds normal; no masses,  no organomegaly Extremities: edematous upper extremities  Lab Results:  Recent Labs  04/21/17 1357 04/22/17 0343 04/23/17 0745  WBC 22.2* 22.2* 38.7*  HGB 8.4* 8.1* 8.7*  HCT 26.1* 24.0* 27.4*  PLT 636* 585* 542*   BMET  Recent Labs  04/21/17 1357 04/22/17 0343 04/23/17 0745  NA 144 142 142  K 3.1* 2.7* 3.8  CL 108 111 115*  CO2 18* 21* 18*  GLUCOSE 163* 126* 152*  BUN 32* 31* 34*  CREATININE 2.55* 1.89* 1.50*  CALCIUM 5.3* 5.6* 5.6*   LFT  Recent Labs  04/23/17 1046  PROT 4.8*  ALBUMIN 1.7*  AST 70*  ALT 76*  ALKPHOS 155*  BILITOT 0.2*  BILIDIR <0.1*  IBILI NOT CALCULATED   PT/INR No results for input(s): LABPROT, INR in the last 72 hours. Hepatitis Panel No results for input(s): HEPBSAG, HCVAB, HEPAIGM, HEPBIGM in the last 72 hours. C-Diff No results for input(s): CDIFFTOX in the last 72 hours. Fecal Lactopherrin No results for input(s): FECLLACTOFRN in the last 72 hours.  Studies/Results: Dg Chest Port 1 View  Result Date: 04/23/2017 CLINICAL DATA:  Respiratory failure,  evaluate endotracheal tube position EXAM: PORTABLE CHEST 1 VIEW COMPARISON:  Portable chest x-ray of 04/22/2017 FINDINGS: The tip of the endotracheal tube is approximately 1.9 cm above the carina. Left central venous line tip overlies the lower SVC. Minimal pulmonary vascular congestion is present. Small pleural effusions cannot be excluded. Heart size is stable. IMPRESSION: 1. Tip of endotracheal tube 1.9 cm above the carina. 2. Little change in probable mild pulmonary vascular congestion and small effusions. Electronically Signed   By: Ivar Drape M.D.   On: 04/23/2017 11:09   Dg Chest Port 1 View  Result Date: 04/22/2017 CLINICAL DATA:  Initial evaluation for acute respiratory failure with hypoxia. EXAM: PORTABLE CHEST 1 VIEW COMPARISON:  Prior radiograph from 04/21/2017. FINDINGS: Patient remains intubated with the tip of the endotracheal tube positioned approximately 2.7 cm above the carina. Left IJ approach centra venous catheter remains in place with tip overlying the distal SVC. Enteric tube courses in the the abdomen. Stable cardiac and mediastinal silhouettes. Lungs mildly hypoinflated. Similar bilateral coarse interstitial opacities. Slightly worsened hazy density within the peripheral right lung base. Suspected small right pleural effusion. No pulmonary edema. No pneumothorax. Osseous structures unchanged. IMPRESSION: 1. Support apparatus in satisfactory position as above. 2. Similar coarse bilateral interstitial opacities, which may reflect sequelae of underlying infection in the correct clinical setting. Slightly increased hazy opacity at the peripheral right lung base as compared to previous. 3. Suspected small right pleural effusion. Electronically Signed   By: Jeannine Boga  M.D.   On: 04/22/2017 06:53    Medications:  Scheduled: . arformoterol  15 mcg Nebulization BID  . budesonide (PULMICORT) nebulizer solution  0.5 mg Nebulization BID  . chlorhexidine gluconate (MEDLINE KIT)  15  mL Mouth Rinse BID  . Chlorhexidine Gluconate Cloth  6 each Topical Daily  . fentaNYL (SUBLIMAZE) injection  50 mcg Intravenous Once  . guaiFENesin  600 mg Oral BID  . ipratropium-albuterol  3 mL Nebulization Q6H  . mouth rinse  15 mL Mouth Rinse QID  . pantoprazole (PROTONIX) IV  40 mg Intravenous Q12H  . sodium chloride flush  10-40 mL Intracatheter Q12H  . sodium chloride flush  3 mL Intravenous Q12H   Continuous: . sodium chloride Stopped (04/23/17 1400)  . DOBUTamine 5 mcg/kg/min (04/23/17 0800)  . feeding supplement (VITAL AF 1.2 CAL) 1,000 mL (04/23/17 0602)  . fentaNYL infusion INTRAVENOUS 35 mcg/hr (04/23/17 1200)  . heparin 700 Units/hr (04/23/17 0349)  . norepinephrine (LEVOPHED) Adult infusion 3 mcg/min (04/23/17 1400)  . piperacillin-tazobactam (ZOSYN)  IV Stopped (04/23/17 0612)  . vancomycin Stopped (04/22/17 0610)    Assessment/Plan: 1) Aspiration pneumonia. 2) Persistent hypotension. 3) Increased leukocytosis. 4) Respiratory failure. 5) AKI.   There is no significant change.  Her WBC has increased and she still remains on levophed and dobutamine.  Unable to wean from vent at this time.  No evidence of any diarrhea to help explain increase in WBC, i.e., C. Diff.  Creatinine has improved, but urine output is declining.  She remains in critical condition.  Plan: 1) Continue with current supportive care.  LOS: 3 days   Shalicia Craghead D 04/23/2017, 4:14 PM

## 2017-04-23 NOTE — Progress Notes (Signed)
ANTICOAGULATION CONSULT NOTE - Follow Up Consult  Pharmacy Consult for heparin Indication: chest pain/ACS  Allergies  Allergen Reactions  . Bee Venom Anaphylaxis and Hives    Patient Measurements: Height: 4\' 10"  (147.3 cm) Weight: 100 lb 15.5 oz (45.8 kg) IBW/kg (Calculated) : 40.9 Heparin Dosing Weight: 43 kg  Vital Signs: Temp: 98.7 F (37.1 C) (07/09 2337) Temp Source: Axillary (07/09 2337) BP: 154/59 (07/10 0000) Pulse Rate: 80 (07/10 0000)  Labs:  Recent Labs  04/20/17 1050  04/20/17 1340  04/20/17 2004 04/21/17 0138 04/21/17 1357 04/22/17 0343 04/22/17 1210 04/23/17 0201  HGB 10.9*  < >  --   --   --  9.1* 8.4* 8.1*  --   --   HCT 34.0*  < >  --   --   --  27.8* 26.1* 24.0*  --   --   PLT 709*  --   --   --   --  624* 636* 585*  --   --   LABPROT 14.2  --   --   --   --   --   --   --   --   --   INR 1.10  --   --   --   --   --   --   --   --   --   HEPARINUNFRC  --   --   --   < > <0.10* 0.14* <0.10* 0.44 0.16* 0.26*  CREATININE 3.09*  < >  --   --   --  2.64* 2.55* 1.89*  --   --   TROPONINI  --   --  2.02*  --  2.08* 3.82*  --   --   --   --   < > = values in this interval not displayed.  Estimated Creatinine Clearance: 17.1 mL/min (A) (by C-G formula based on SCr of 1.89 mg/dL (H)).  Assessment: CC/HPI: 71 yo F presenting to ED w/ persistent N/V and weakness, sepsis r/o. Pharmacy consulted to dose heparin for ACS secondary to elevated troponins. No oral anticoagulation PTA.    Heparin level subtherapeutic at 0.26 with no interruptions/issues per RN. Hgb is low-stable at 8.1 (last CBC 7/9). No overt s/s bleeding noted.    Goal of Therapy:  Heparin level 0.3-0.7 units/ml Monitor platelets by anticoagulation protocol: Yes   Plan:  Increase heparin gtt to 700 units/hr Heparin level and CBC in 8 hours Daily heparin level and CBC Monitor for s/s bleeding F/u cards plans   Argie Ramming, PharmD Clinical Pharmacist 04/23/17 2:50 AM

## 2017-04-24 ENCOUNTER — Inpatient Hospital Stay (HOSPITAL_COMMUNITY): Payer: Medicare HMO

## 2017-04-24 LAB — BASIC METABOLIC PANEL
ANION GAP: 7 (ref 5–15)
BUN: 35 mg/dL — ABNORMAL HIGH (ref 6–20)
CALCIUM: 6 mg/dL — AB (ref 8.9–10.3)
CO2: 18 mmol/L — AB (ref 22–32)
CREATININE: 1.35 mg/dL — AB (ref 0.44–1.00)
Chloride: 116 mmol/L — ABNORMAL HIGH (ref 101–111)
GFR, EST AFRICAN AMERICAN: 44 mL/min — AB (ref >60)
GFR, EST NON AFRICAN AMERICAN: 38 mL/min — AB (ref >60)
Glucose, Bld: 121 mg/dL — ABNORMAL HIGH (ref 65–99)
Potassium: 4.3 mmol/L (ref 3.5–5.1)
SODIUM: 141 mmol/L (ref 135–145)

## 2017-04-24 LAB — GLUCOSE, CAPILLARY
GLUCOSE-CAPILLARY: 105 mg/dL — AB (ref 65–99)
GLUCOSE-CAPILLARY: 116 mg/dL — AB (ref 65–99)
GLUCOSE-CAPILLARY: 146 mg/dL — AB (ref 65–99)
GLUCOSE-CAPILLARY: 150 mg/dL — AB (ref 65–99)
Glucose-Capillary: 144 mg/dL — ABNORMAL HIGH (ref 65–99)
Glucose-Capillary: 123 mg/dL — ABNORMAL HIGH (ref 65–99)

## 2017-04-24 LAB — CBC
HCT: 25.3 % — ABNORMAL LOW (ref 36.0–46.0)
HEMOGLOBIN: 7.9 g/dL — AB (ref 12.0–15.0)
MCH: 27.2 pg (ref 26.0–34.0)
MCHC: 31.2 g/dL (ref 30.0–36.0)
MCV: 87.2 fL (ref 78.0–100.0)
PLATELETS: 532 10*3/uL — AB (ref 150–400)
RBC: 2.9 MIL/uL — AB (ref 3.87–5.11)
RDW: 14.9 % (ref 11.5–15.5)
WBC: 36.7 10*3/uL — AB (ref 4.0–10.5)

## 2017-04-24 LAB — LACTIC ACID, PLASMA: Lactic Acid, Venous: 0.8 mmol/L (ref 0.5–1.9)

## 2017-04-24 LAB — TROPONIN I: TROPONIN I: 3.61 ng/mL — AB (ref <0.03)

## 2017-04-24 LAB — HEPARIN LEVEL (UNFRACTIONATED): Heparin Unfractionated: 0.37 IU/mL (ref 0.30–0.70)

## 2017-04-24 MED ORDER — IPRATROPIUM-ALBUTEROL 0.5-2.5 (3) MG/3ML IN SOLN: 3.0000 mL | RESPIRATORY_TRACT | Status: DC | PRN

## 2017-04-24 MED ORDER — SODIUM CHLORIDE 0.9 % IV SOLN
1.0000 g | Freq: Once | INTRAVENOUS | Status: AC
  Administered 2017-04-24: 1 g via INTRAVENOUS
  Filled 2017-04-24: qty 10

## 2017-04-24 MED ORDER — DOCUSATE SODIUM 50 MG/5ML PO LIQD
100.0000 mg | Freq: Two times a day (BID) | ORAL | Status: DC
  Administered 2017-04-24 – 2017-04-25 (×3): 100 mg
  Filled 2017-04-24 (×2): qty 10

## 2017-04-24 MED ORDER — SENNOSIDES 8.8 MG/5ML PO SYRP
5.0000 mL | ORAL_SOLUTION | Freq: Two times a day (BID) | ORAL | Status: DC
  Administered 2017-04-24 – 2017-04-25 (×3): 5 mL
  Filled 2017-04-24 (×3): qty 5

## 2017-04-24 MED ORDER — ATROPINE SULFATE 1 MG/10ML IJ SOSY
PREFILLED_SYRINGE | INTRAMUSCULAR | Status: AC
  Filled 2017-04-24: qty 10

## 2017-04-24 MED ORDER — PIPERACILLIN-TAZOBACTAM 3.375 G IVPB
3.3750 g | Freq: Three times a day (TID) | INTRAVENOUS | Status: DC
  Administered 2017-04-24 – 2017-04-26 (×6): 3.375 g via INTRAVENOUS
  Filled 2017-04-24 (×7): qty 50

## 2017-04-24 NOTE — Progress Notes (Signed)
ANTICOAGULATION CONSULT NOTE - Follow Up Consult  Pharmacy Consult for heparin Indication: chest pain/ACS  Allergies  Allergen Reactions  . Bee Venom Anaphylaxis and Hives    Patient Measurements: Height: 4\' 10"  (147.3 cm) Weight: 119 lb 4.3 oz (54.1 kg) IBW/kg (Calculated) : 40.9 Heparin Dosing Weight: 43 kg  Vital Signs: Temp: 98.3 F (36.8 C) (07/11 0805) Temp Source: Oral (07/11 0805) BP: 119/66 (07/11 0700) Pulse Rate: 88 (07/11 0700)  Labs:  Recent Labs  04/22/17 0343  04/23/17 0201 04/23/17 0745 04/23/17 1046 04/23/17 1700 04/23/17 2205 04/24/17 0330  HGB 8.1*  --   --  8.7*  --   --   --  7.9*  HCT 24.0*  --   --  27.4*  --   --   --  25.3*  PLT 585*  --   --  542*  --   --   --  532*  APTT  --   --   --  68*  --   --   --   --   HEPARINUNFRC 0.44  < > 0.26*  --  0.30  --   --  0.37  CREATININE 1.89*  --   --  1.50*  --   --   --  1.35*  TROPONINI  --   --   --   --   --  4.39* 4.04* 3.61*  < > = values in this interval not displayed.  Estimated Creatinine Clearance: 27.1 mL/min (A) (by C-G formula based on SCr of 1.35 mg/dL (H)).  Assessment: CC/HPI: 71 yo F presenting to ED w/ persistent N/V and weakness, sepsis r/o   PMH: anxiety, carotid stenosis, fibromyalgia, HLD, neuropathy, osteoporosis, syncope  Anticoag: none pta on hep gtt 700 units/hr with lvl 0.37  Renal: AKI SCr 1.35, Ca 6  Heme/Onc: H&H 7.9/25.3, PLt 532  Goal of Therapy:  Heparin level 0.3-0.7 units/ml Monitor platelets by anticoagulation protocol: Yes   Plan:  Heparin 700 units/hrs Daily HL, CBC  Levester Fresh, PharmD, BCPS, BCCCP Clinical Pharmacist Clinical phone for 04/24/2017 from 7a-3:30p: [phone] If after 3:30p, please call main pharmacy at: [phone] 04/24/2017 8:07 AM

## 2017-04-24 NOTE — Progress Notes (Signed)
Pharmacy Antibiotic Note  Alexandra House is a 71 y.o. female admitted on 04/20/2017 with sepsis.    Admitted with AKI; now with some improvement  Plan: Adjust zosyn to 3.375 gm iv q8h Dc vanc Continue to follow cx, including new ones drawn 7/10  Height: 4\' 10"  (147.3 cm) Weight: 119 lb 4.3 oz (54.1 kg) IBW/kg (Calculated) : 40.9  Temp (24hrs), Avg:98 F (36.7 C), Min:97.9 F (36.6 C), Max:98.3 F (36.8 C)   Recent Labs Lab 04/20/17 1119  04/20/17 1353 04/21/17 0138 04/21/17 1357 04/21/17 1755 04/22/17 0343 04/22/17 1700 04/23/17 0745 04/24/17 0330  WBC  --   --   --  20.1* 22.2*  --  22.2*  --  38.7* 36.7*  CREATININE  --   < >  --  2.64* 2.55*  --  1.89*  --  1.50* 1.35*  LATICACIDVEN 6.39*  --  3.80*  --  9.7* 6.4*  --  2.3*  --   --   VANCORANDOM  --   --   --   --   --   --  13  --   --   --   < > = values in this interval not displayed.  Estimated Creatinine Clearance: 27.1 mL/min (A) (by C-G formula based on SCr of 1.35 mg/dL (H)).    Allergies  Allergen Reactions  . Bee Venom Anaphylaxis and Hives   Levester Fresh, PharmD, BCPS, BCCCP Clinical Pharmacist Clinical phone for 04/24/2017 from 7a-3:30p: [phone] If after 3:30p, please call main pharmacy at: [phone] 04/24/2017 8:09 AM

## 2017-04-24 NOTE — Progress Notes (Signed)
Progress Note  Patient Name: Alexandra House Date of Encounter: 04/24/2017  Primary Cardiologist:   Dr. Stanford Breed  Subjective   Denies chest pain.  Awake on the vent. Responds to verbal. Family at bedside.  Inpatient Medications    Scheduled Meds: . arformoterol  15 mcg Nebulization BID  . budesonide (PULMICORT) nebulizer solution  0.5 mg Nebulization BID  . chlorhexidine gluconate (MEDLINE KIT)  15 mL Mouth Rinse BID  . Chlorhexidine Gluconate Cloth  6 each Topical Daily  . fentaNYL (SUBLIMAZE) injection  50 mcg Intravenous Once  . guaiFENesin  600 mg Oral BID  . ipratropium-albuterol  3 mL Nebulization Q6H  . mouth rinse  15 mL Mouth Rinse QID  . pantoprazole (PROTONIX) IV  40 mg Intravenous Q12H  . potassium chloride  40 mEq Per Tube Q4H  . sodium chloride flush  10-40 mL Intracatheter Q12H  . sodium chloride flush  3 mL Intravenous Q12H   Continuous Infusions: . sodium chloride Stopped (04/23/17 1400)  . DOBUTamine 5 mcg/kg/min (04/23/17 0800)  . feeding supplement (VITAL AF 1.2 CAL) 1,000 mL (04/23/17 0602)  . fentaNYL infusion INTRAVENOUS 25 mcg/hr (04/24/17 0419)  . heparin 700 Units/hr (04/23/17 5003)  . norepinephrine (LEVOPHED) Adult infusion 5 mcg/min (04/24/17 0502)  . piperacillin-tazobactam (ZOSYN)  IV Stopped (04/24/17 0646)  . vancomycin Stopped (04/24/17 0518)   PRN Meds: acetaminophen **OR** acetaminophen, docusate, fentaNYL, LORazepam, midazolam, ondansetron **OR** ondansetron (ZOFRAN) IV, sodium chloride flush   Vital Signs    Vitals:   04/24/17 0700 04/24/17 0720 04/24/17 0724 04/24/17 0726  BP: 119/66     Pulse: 88     Resp: (!) 24     Temp:      TempSrc:      SpO2: 100% 100% 100% 100%  Weight:      Height:        Intake/Output Summary (Last 24 hours) at 04/24/17 0742 Last data filed at 04/24/17 0700  Gross per 24 hour  Intake          3217.74 ml  Output              758 ml  Net          2459.74 ml   Filed Weights   04/22/17 0500  04/23/17 0446 04/24/17 0336  Weight: 100 lb 15.5 oz (45.8 kg) 112 lb 7 oz (51 kg) 119 lb 4.3 oz (54.1 kg)    Telemetry    NSR, sinus brady - Frequent PACs, some Wenkebach overnight, HR lowest about 3 am. No pauses > 2 sec  ECG    07/11, SR, improved T waves from 07/07, no pathologic Q waves but decreased voltage - Personally Reviewed  Physical Exam   GEN: Tolerating breathing tube pretty well today, coughs at times, not acutely uncomfortable Neck: JVD elevated on the R, Lines and equipment on the L Cardiac: RRR, no sig R/G, soft murmur Respiratory: on the vent,  Coarse rhonchi,  Especially with cough  GI: Soft, nontender, non-distended  MS: 2+ LE edema 1+ UE edema; No deformity. Neuro:  Nonfocal, wants to get the tube out Psych: Normal affect   Labs    Chemistry  Recent Labs Lab 04/21/17 0138 04/21/17 1357 04/22/17 0343 04/23/17 0745 04/23/17 1046 04/24/17 0330  NA 136 144 142 142  --  141  K 4.1 3.1* 2.7* 3.8  --  4.3  CL 108 108 111 115*  --  116*  CO2 18* 18* 21* 18*  --  18*  GLUCOSE 146* 163* 126* 152*  --  121*  BUN 31* 32* 31* 34*  --  35*  CREATININE 2.64* 2.55* 1.89* 1.50*  --  1.35*  CALCIUM 5.7* 5.3* 5.6* 5.6*  --  6.0*  PROT 5.1* 4.6*  --   --  4.8*  --   ALBUMIN 1.7* 1.8*  --   --  1.7*  --   AST 142* 195*  --   --  70*  --   ALT 54 71*  --   --  76*  --   ALKPHOS 168* 142*  --   --  155*  --   BILITOT 0.7 0.5  --   --  0.2*  --   GFRNONAA 17* 18* 25* 33*  --  38*  GFRAA 20* 20* 29* 39*  --  44*  ANIONGAP 10 18* 10 9  --  7     Hematology  Recent Labs Lab 04/22/17 0343 04/23/17 0745 04/24/17 0330  WBC 22.2* 38.7* 36.7*  RBC 2.92* 3.20* 2.90*  HGB 8.1* 8.7* 7.9*  HCT 24.0* 27.4* 25.3*  MCV 82.2 85.6 87.2  MCH 27.7 27.2 27.2  MCHC 33.8 31.8 31.2  RDW 13.9 14.4 14.9  PLT 585* 542* 532*    Cardiac Enzymes  Recent Labs Lab 04/21/17 0138 04/23/17 1700 04/23/17 2205 04/24/17 0330  TROPONINI 3.82* 4.39* 4.04* 3.61*     Recent  Labs Lab 04/20/17 1116  TROPIPOC 2.05*     BNP  Recent Labs Lab 04/21/17 1357  BNP 1,528.1*    Radiology    Dg Chest Port 1 View Result Date: 04/23/2017 CLINICAL DATA:  Respiratory failure, evaluate endotracheal tube position EXAM: PORTABLE CHEST 1 VIEW COMPARISON:  Portable chest x-ray of 04/22/2017 FINDINGS: The tip of the endotracheal tube is approximately 1.9 cm above the carina. Left central venous line tip overlies the lower SVC. Minimal pulmonary vascular congestion is present. Small pleural effusions cannot be excluded. Heart size is stable. IMPRESSION: 1. Tip of endotracheal tube 1.9 cm above the carina. 2. Little change in probable mild pulmonary vascular congestion and small effusions. Electronically Signed   By: Ivar Drape M.D.   On: 04/23/2017 11:09   Dg Chest Port 1 View - no CXR 07/10 Result Date: 04/22/2017 CLINICAL DATA:  Initial evaluation for acute respiratory failure with hypoxia. EXAM: PORTABLE CHEST 1 VIEW COMPARISON:  Prior radiograph from 04/21/2017. FINDINGS: Patient remains intubated with the tip of the endotracheal tube positioned approximately 2.7 cm above the carina. Left IJ approach centra venous catheter remains in place with tip overlying the distal SVC. Enteric tube courses in the the abdomen. Stable cardiac and mediastinal silhouettes. Lungs mildly hypoinflated. Similar bilateral coarse interstitial opacities. Slightly worsened hazy density within the peripheral right lung base. Suspected small right pleural effusion. No pulmonary edema. No pneumothorax. Osseous structures unchanged. IMPRESSION: 1. Support apparatus in satisfactory position as above. 2. Similar coarse bilateral interstitial opacities, which may reflect sequelae of underlying infection in the correct clinical setting. Slightly increased hazy opacity at the peripheral right lung base as compared to previous. 3. Suspected small right pleural effusion. Electronically Signed   By: Jeannine Boga  M.D.   On: 04/22/2017 06:53   Cardiac Studies   Echo 04/21/2017 - Left ventricle: The cavity size was normal. Wall thickness was   normal. Systolic function was mildly reduced. The estimated   ejection fraction was 45%. - Regional wall motion abnormality: Hypokinesis of the basal-mid   anteroseptal, basal-mid inferoseptal,  basal-mid inferior, and   apical septal myocardium. - Mitral valve: There was moderate regurgitation. - Right ventricle: Systolic function was mildly reduced. - Tricuspid valve: There was mild regurgitation.  Patient Profile     71 y.o. female who presented with progressive weakness and ongoing abdominal complaints.  In the ED she was hypotensive.  Creat is acutely elevated and lactic acid is elevated.  WBC is 27.7.   Her BP has responded to hydration.  We are consulted secondary to a troponin of 2.05.    Assessment & Plan    HYPOTENSION:  Suspect septic shock. She is intubated.  - Management per CCM. Decreased Levo from yesterday, no change in DBA - still on IVF NS at 125 cc/hr, I/O + by 14.3 L since admission, wt up 25 lbs, volume overload on exam but CXR with only mild extra volume. Diuretics per MDs  ELEVATED TROPONIN:  Likely secondary to her acute illness.   - Has been on heparin since admission, MD advise on duration of therapy.  Troponin was 2, now > 4 but trending down - Echo with mildly decreased EF, WMA - Reassess after recovery from acute illness, family understands that we may hold off on ischemic eval until she can be reassessed after recovery. - recheck echo in 1 week or prior to d/c>>if still w/ WMA, may need cath  BRADYCARDIA:  Still with bradycardia 2nd Wenkebach at times. No pauses > 2 sec. Continue to follow. Not on rate-lowering rx. HR increases appropriately when she coughs or gets agitated.    Jonetta Speak, PA-C  04/24/2017, 7:42 AM     Attending Note:   The patient was seen and examined.  Agree with assessment and plan as  noted above.  Changes made to the above note as needed.  Patient seen and independently examined with Rosaria Ferries, PA .   We discussed all aspects of the encounter. I agree with the assessment and plan as stated above.  1.  Elevated troponin- likely demand ischemia due to underlying illness, shock.  LV function is mildly depressed.  She has an underlying infection ( WBC = 36.7K) with septic shock. The mildly depressed LV function, MR is not the cause of her hypotension.   2.   COPD  3.  Sepsis syndrome     I have spent a total of 40 minutes with patient reviewing hospital  notes , telemetry, EKGs, labs and examining patient as well as establishing an assessment and plan that was discussed with the patient. > 50% of time was spent in direct patient care.    Thayer Headings, Brooke Bonito., MD, [facility] 04/24/2017, 8:20 AM [address]  [place] Pager [phone]

## 2017-04-24 NOTE — Progress Notes (Signed)
Made Dr Vaughan Browner aware of pt decreased urine output. Will continue to monitor.

## 2017-04-24 NOTE — Progress Notes (Signed)
PULMONARY / CRITICAL CARE MEDICINE   Name: Alexandra House MRN: [id number] DOB: 27-Jun-1946    ADMISSION DATE:  04/20/2017 REFERRING MD:Dr. Aileen Fass   CHIEF COMPLAINT:AMS, Hypotension, Hypothermia, Bradycardia   HISTORY OF PRESENT ILLNESS: [name] who initially presented with bradycardia in the 30's, hypotension (79/39), tachypnea, hypothermic. She was treated with IVF with improvement in BP. The patient was admitted per [facility] and treated for suspected sepsis. She developed worsening hypotension, bradycardia, altered mental status and increased work of breathing on 7/8. PCCM called back for re-evaluation. Currently intubated and on pressors.   SUBJECTIVE: No acute overnight events. Levophed down to 6 this AM and continuing dobutamine.  VITAL SIGNS: BP 119/66   Pulse 88   Temp 97.9 F (36.6 C) (Oral)   Resp (!) 24   Ht _0  (1.473 m)   Wt 119 lb 4.3 oz (54.1 kg)   SpO2 100%   BMI 24.93 kg/m   HEMODYNAMICS:    VENTILATOR SETTINGS: Vent Mode: PSV;CPAP FiO2 (%):  [30 %-40 %] 40 % Set Rate:  [30 bmp] 30 bmp Vt Set:  [360 mL] 360 mL PEEP:  [5 cmH20] 5 cmH20 Pressure Support:  [5 cmH20-12 cmH20] 5 cmH20 Plateau Pressure:  [12 cmH20-18 cmH20] 18 cmH20  INTAKE / OUTPUT: I/O last 3 completed shifts: In: 2751 [I.V.:3861.3; NG/GT:1438.7; IV Piggyback:460] Out: 828 [Urine:828]  PHYSICAL EXAMINATION: General: [name] intubated sitting up in bed appearing comfortable and in NAD Neuro: alert, responds to commands HEENT: AT/[place], MMM, PERRL Cardiovascular: RRR, no MRG Lungs: CTABL Abdomen: +BS, soft, NTND Musculoskeletal: No gross deformities Extremities: significant edema in upper extremities  LABS:  BMET  Recent Labs Lab 04/22/17 0343 04/23/17 0745 04/24/17 0330  NA 142 142 141  K 2.7* 3.8 4.3  CL 111 115* 116*  CO2 21* 18* 18*  BUN 31* 34* 35*  CREATININE 1.89* 1.50* 1.35*  GLUCOSE 126* 152* 121*    Electrolytes  Recent Labs Lab  04/20/17 1340 04/20/17 2004  04/22/17 0343 04/23/17 0745 04/24/17 0330  CALCIUM  --   --   < > 5.6* 5.6* 6.0*  MG 1.8 1.9  --   --   --   --   PHOS 5.2* 4.7*  --   --   --   --   < > = values in this interval not displayed.  CBC  Recent Labs Lab 04/22/17 0343 04/23/17 0745 04/24/17 0330  WBC 22.2* 38.7* 36.7*  HGB 8.1* 8.7* 7.9*  HCT 24.0* 27.4* 25.3*  PLT 585* 542* 532*    Coag's  Recent Labs Lab 04/20/17 1050 04/23/17 0745  APTT  --  68*  INR 1.10  --     Sepsis Markers  Recent Labs Lab 04/20/17 1340  04/21/17 1357 04/21/17 1755 04/22/17 1700 04/23/17 1046  LATICACIDVEN  --   < > 9.7* 6.4* 2.3*  --   PROCALCITON 1.07  --   --   --   --  0.72  < > = values in this interval not displayed.  ABG  Recent Labs Lab 04/21/17 1220 04/21/17 1557  PHART 7.331* 7.432  PCO2ART BELOW REPORTABLE RANGE 26.2*  PO2ART 121.00* 158*    Liver Enzymes  Recent Labs Lab 04/21/17 0138 04/21/17 1357 04/23/17 1046  AST 142* 195* 70*  ALT 54 71* 76*  ALKPHOS 168* 142* 155*  BILITOT 0.7 0.5 0.2*  ALBUMIN 1.7* 1.8* 1.7*    Cardiac Enzymes  Recent Labs Lab 04/23/17 1700 04/23/17 2205 04/24/17 0330  TROPONINI 4.39* 4.04* 3.61*    Glucose  Recent Labs Lab 04/23/17 0806 04/23/17 1146 04/23/17 1537 04/23/17 1937 04/23/17 2346 04/24/17 0421  GLUCAP 142* 134* 107* 118* 136* 105*    Imaging Dg Chest Port 1 View  Result Date: 04/23/2017 CLINICAL DATA:  Respiratory failure, evaluate endotracheal tube position EXAM: PORTABLE CHEST 1 VIEW COMPARISON:  Portable chest x-ray of 04/22/2017 FINDINGS: The tip of the endotracheal tube is approximately 1.9 cm above the carina. Left central venous line tip overlies the lower SVC. Minimal pulmonary vascular congestion is present. Small pleural effusions cannot be excluded. Heart size is stable. IMPRESSION: 1. Tip of endotracheal tube 1.9 cm above the carina. 2. Little change in probable mild pulmonary vascular  congestion and small effusions. Electronically Signed   By: Ivar Drape M.D.   On: 04/23/2017 11:09     STUDIES: 7/05 [place] ABD >>abdominal US on 7/5 which was negative for gallstones, hepatic abnormality, normal pancreas &no hydronephrosis.  7/7 CT chest/abdomen/pelvis> emphysema, mult-lobar pneumonia, fatty liver, fat stranding around head of pancreas  CULTURES: BCx2 7/7 >> UC 7/7 >> resp culture 7/8 > Ucx: 7/7>> negative  ANTIBIOTICS: Zosyn 7/7 >> Vanco 7/7 >>  SIGNIFICANT EVENTS: 7/07 Admit with N/V, weakness, hypotension  7/8 >> intubated  LINES/TUBES: ETT 7/8 >> CVC LIJ 7/8 >>  DISCUSSION: 71 y/o female with a past medical history of osteoporosis treated with prolia was admitted with several months of nausea, vomiting which lead to dyspnea related to aspiration pneumonia and septic shock. Now with progressive metabolic acidosis, acute respiratory failure and likely demand ischemia. DDx of shock 7/8 is septic shock vs cardiogenic shock.   ASSESSMENT / PLAN:  PULMONARY A: Acute Respiratory Failure with hypoxemia due to aspiration pneumonia  Centrilobular Emphysema  P: Full mechanical vent support  VAP prevention Daily WUA/SBT Start brovana/pulmicort, mucinex Start duoneb  CARDIOVASCULAR A:  Bradycardia  Elevated Troponin - suspect demand ischemia, 2.02>2.08>3.8 Hx Grade II Diastolic Dysfunction, LVEF Hx Carotid Stenosis  Shock: presumably septic vs cardiogenic P: Continue heparin Check probnp Levophed titrated to MAP > 65, MAP 81 Tele Lasix if BP can tolerate Recheck echo in one week  RENAL A:  AKI Non-gap metabolic acidosis Hypokalemia, resolved Hypocalcemia: 6.0 s/p calcium gluconate  P: Monitor BMET and UOP Replace electrolytes as needed Calcium gluconate  GASTROINTESTINAL A:  Nausea / Vomiting, Weight Loss - ? If related to Prolia  Elevated alk phos without evidence of biliary obstruction P: NPO /  OGT Pepcid for SUP  Cont tube feeds GI following; no recommendations at this time.  Consider abd xray  Lipase/amylase WNL  HEMATOLOGIC A:  Anemia, stable. Hgb 7.9 (8.7) P: Monitor for bleeding Transfusion threshold <8 Cont heparin drip (0.37)  INFECTIOUS A:  Aspiration pneumonia P: F/u resp culture Continue zosyn Cont vanc  ENDOCRINE A:  Hyperglycemia  P: SSI  NEUROLOGIC A:  Mild confusion/acute encephalopathy due to sepsis/shock P: RASS goal: -1 Fentanyl gtt Versed prn SBT daily ativan 36m BID PRN    FAMILY - Updates: updated husband and son at bedside at length  - Inter-disciplinary family meet or Palliative Care meeting due by: day 7   Pulmonary and [place]ake [place]ager: [phone] 04/24/2017, 7:56 AM

## 2017-04-24 NOTE — Progress Notes (Signed)
Placed patient back on full support for the night.

## 2017-04-25 ENCOUNTER — Inpatient Hospital Stay (HOSPITAL_COMMUNITY): Payer: Medicare HMO

## 2017-04-25 DIAGNOSIS — R601 Generalized edema: Secondary | ICD-10-CM

## 2017-04-25 LAB — CBC
HEMATOCRIT: 25.9 % — AB (ref 36.0–46.0)
HEMOGLOBIN: 8.1 g/dL — AB (ref 12.0–15.0)
MCH: 27.6 pg (ref 26.0–34.0)
MCHC: 31.3 g/dL (ref 30.0–36.0)
MCV: 88.4 fL (ref 78.0–100.0)
Platelets: 602 10*3/uL — ABNORMAL HIGH (ref 150–400)
RBC: 2.93 MIL/uL — AB (ref 3.87–5.11)
RDW: 15.4 % (ref 11.5–15.5)
WBC: 37.8 10*3/uL — ABNORMAL HIGH (ref 4.0–10.5)

## 2017-04-25 LAB — CULTURE, BLOOD (ROUTINE X 2)
CULTURE: NO GROWTH
Culture: NO GROWTH
Special Requests: ADEQUATE

## 2017-04-25 LAB — PROCALCITONIN: PROCALCITONIN: 0.68 ng/mL

## 2017-04-25 LAB — GLUCOSE, CAPILLARY
GLUCOSE-CAPILLARY: 109 mg/dL — AB (ref 65–99)
GLUCOSE-CAPILLARY: 114 mg/dL — AB (ref 65–99)
Glucose-Capillary: 101 mg/dL — ABNORMAL HIGH (ref 65–99)
Glucose-Capillary: 105 mg/dL — ABNORMAL HIGH (ref 65–99)
Glucose-Capillary: 109 mg/dL — ABNORMAL HIGH (ref 65–99)

## 2017-04-25 LAB — BASIC METABOLIC PANEL
Anion gap: 7 (ref 5–15)
BUN: 38 mg/dL — AB (ref 6–20)
CHLORIDE: 113 mmol/L — AB (ref 101–111)
CO2: 18 mmol/L — AB (ref 22–32)
Calcium: 6.2 mg/dL — CL (ref 8.9–10.3)
Creatinine, Ser: 1.53 mg/dL — ABNORMAL HIGH (ref 0.44–1.00)
GFR calc Af Amer: 38 mL/min — ABNORMAL LOW (ref >60)
GFR calc non Af Amer: 33 mL/min — ABNORMAL LOW (ref >60)
Glucose, Bld: 137 mg/dL — ABNORMAL HIGH (ref 65–99)
POTASSIUM: 4.4 mmol/L (ref 3.5–5.1)
Sodium: 138 mmol/L (ref 135–145)

## 2017-04-25 LAB — CULTURE, RESPIRATORY

## 2017-04-25 LAB — CULTURE, RESPIRATORY W GRAM STAIN

## 2017-04-25 LAB — HEPARIN LEVEL (UNFRACTIONATED): Heparin Unfractionated: 0.45 IU/mL (ref 0.30–0.70)

## 2017-04-25 LAB — TROPONIN I: TROPONIN I: 3.45 ng/mL — AB (ref <0.03)

## 2017-04-25 MED ORDER — WHITE PETROLATUM GEL
Status: AC
  Administered 2017-04-25: 13:00:00
  Filled 2017-04-25: qty 1

## 2017-04-25 MED ORDER — NOREPINEPHRINE BITARTRATE 1 MG/ML IV SOLN
0.0000 ug/min | INTRAVENOUS | Status: DC
  Administered 2017-04-25: 8 ug/min via INTRAVENOUS
  Administered 2017-04-26 (×2): 40 ug/min via INTRAVENOUS
  Administered 2017-04-27: 14 ug/min via INTRAVENOUS
  Filled 2017-04-25 (×4): qty 16

## 2017-04-25 MED ORDER — FUROSEMIDE 10 MG/ML IJ SOLN
40.0000 mg | Freq: Two times a day (BID) | INTRAMUSCULAR | Status: DC
  Administered 2017-04-25: 40 mg via INTRAVENOUS
  Filled 2017-04-25 (×2): qty 4

## 2017-04-25 MED ORDER — FUROSEMIDE 10 MG/ML IJ SOLN
20.0000 mg | Freq: Once | INTRAMUSCULAR | Status: AC
  Administered 2017-04-25: 20 mg via INTRAVENOUS
  Filled 2017-04-25: qty 2

## 2017-04-25 MED ORDER — SODIUM CHLORIDE 0.9 % IV SOLN
1.0000 g | Freq: Once | INTRAVENOUS | Status: AC
  Administered 2017-04-25: 1 g via INTRAVENOUS
  Filled 2017-04-25: qty 10

## 2017-04-25 MED ORDER — FUROSEMIDE 10 MG/ML IJ SOLN: 40.0000 mg | Freq: Once | INTRAMUSCULAR | Status: DC

## 2017-04-25 MED ORDER — HEPARIN SODIUM (PORCINE) 5000 UNIT/ML IJ SOLN
5000.0000 [IU] | Freq: Three times a day (TID) | INTRAMUSCULAR | Status: DC
  Administered 2017-04-25 – 2017-05-09 (×41): 5000 [IU] via SUBCUTANEOUS
  Filled 2017-04-25 (×41): qty 1

## 2017-04-25 MED ORDER — FUROSEMIDE 10 MG/ML IJ SOLN
20.0000 mg | Freq: Once | INTRAMUSCULAR | Status: AC
  Administered 2017-04-25: 20 mg via INTRAVENOUS

## 2017-04-25 MED ORDER — FUROSEMIDE 10 MG/ML IJ SOLN
INTRAMUSCULAR | Status: AC
  Filled 2017-04-25: qty 2

## 2017-04-25 NOTE — Progress Notes (Addendum)
Progress Note  Patient Name: Alexandra House Date of Encounter: 04/25/2017  Primary Cardiologist:   Dr. Stanford Breed  Subjective   Denies chest pain.  Awake on the vent. Responds to verbal. Family at bedside.  Inpatient Medications    Scheduled Meds: . arformoterol  15 mcg Nebulization BID  . budesonide (PULMICORT) nebulizer solution  0.5 mg Nebulization BID  . chlorhexidine gluconate (MEDLINE KIT)  15 mL Mouth Rinse BID  . Chlorhexidine Gluconate Cloth  6 each Topical Daily  . fentaNYL (SUBLIMAZE) injection  50 mcg Intravenous Once  . guaiFENesin  600 mg Oral BID  . ipratropium-albuterol  3 mL Nebulization Q6H  . mouth rinse  15 mL Mouth Rinse QID  . pantoprazole (PROTONIX) IV  40 mg Intravenous Q12H  . potassium chloride  40 mEq Per Tube Q4H  . sodium chloride flush  10-40 mL Intracatheter Q12H  . sodium chloride flush  3 mL Intravenous Q12H   Continuous Infusions: . sodium chloride 10 mL/hr at 04/25/17 0400  . DOBUTamine Stopped (04/24/17 7425)  . feeding supplement (VITAL AF 1.2 CAL) 1,000 mL (04/24/17 1900)  . fentaNYL infusion INTRAVENOUS 25 mcg/hr (04/25/17 0802)  . heparin 700 Units/hr (04/24/17 1900)  . norepinephrine (LEVOPHED) Adult infusion 8 mcg/min (04/25/17 9563)  . piperacillin-tazobactam (ZOSYN)  IV 3.375 g (04/25/17 0656)   PRN Meds: acetaminophen **OR** acetaminophen, fentaNYL, ipratropium-albuterol, LORazepam, midazolam, ondansetron **OR** ondansetron (ZOFRAN) IV, sodium chloride flush   Vital Signs    Vitals:   04/25/17 0615 04/25/17 0800 04/25/17 0807 04/25/17 0832  BP: (!) 103/45 (!) 116/50    Pulse: (!) 50     Resp: (!) 27     Temp:   98 F (36.7 C)   TempSrc:   Oral   SpO2: 100%   100%  Weight:      Height:        Intake/Output Summary (Last 24 hours) at 04/25/17 0851 Last data filed at 04/25/17 0800  Gross per 24 hour  Intake          2271.92 ml  Output              392 ml  Net          1879.92 ml   Filed Weights   04/23/17 0446  04/24/17 0336 04/25/17 0315  Weight: 112 lb 7 oz (51 kg) 119 lb 4.3 oz (54.1 kg) 120 lb 9.5 oz (54.7 kg)    Telemetry    NSR, sinus brady - Frequent PACs, some Wenkebach overnight, HR lowest about 3 am. No pauses > 2 sec  ECG    07/11, SR, improved T waves from 07/07, no pathologic Q waves but decreased voltage - Personally Reviewed  Physical Exam   GEN: Tolerating breathing tube pretty well today, coughs at times, not acutely uncomfortable Neck: JVD elevated on the R, Lines and equipment on the L Cardiac: RRR, no sig R/G, soft murmur Respiratory: on the vent,  Coarse rhonchi,  Especially with cough  GI: Soft, nontender, non-distended  MS: 2+ LE edema 1+ UE edema; No deformity. Neuro:  Nonfocal, wants to get the tube out Psych: Normal affect   Labs    Chemistry  Recent Labs Lab 04/21/17 0138 04/21/17 1357  04/23/17 0745 04/23/17 1046 04/24/17 0330 04/25/17 0433  NA 136 144  < > 142  --  141 138  K 4.1 3.1*  < > 3.8  --  4.3 4.4  CL 108 108  < > 115*  --  116* 113*  CO2 18* 18*  < > 18*  --  18* 18*  GLUCOSE 146* 163*  < > 152*  --  121* 137*  BUN 31* 32*  < > 34*  --  35* 38*  CREATININE 2.64* 2.55*  < > 1.50*  --  1.35* 1.53*  CALCIUM 5.7* 5.3*  < > 5.6*  --  6.0* 6.2*  PROT 5.1* 4.6*  --   --  4.8*  --   --   ALBUMIN 1.7* 1.8*  --   --  1.7*  --   --   AST 142* 195*  --   --  70*  --   --   ALT 54 71*  --   --  76*  --   --   ALKPHOS 168* 142*  --   --  155*  --   --   BILITOT 0.7 0.5  --   --  0.2*  --   --   GFRNONAA 17* 18*  < > 33*  --  38* 33*  GFRAA 20* 20*  < > 39*  --  44* 38*  ANIONGAP 10 18*  < > 9  --  7 7  < > = values in this interval not displayed.   Hematology  Recent Labs Lab 04/23/17 0745 04/24/17 0330 04/25/17 0433  WBC 38.7* 36.7* 37.8*  RBC 3.20* 2.90* 2.93*  HGB 8.7* 7.9* 8.1*  HCT 27.4* 25.3* 25.9*  MCV 85.6 87.2 88.4  MCH 27.2 27.2 27.6  MCHC 31.8 31.2 31.3  RDW 14.4 14.9 15.4  PLT 542* 532* 602*    Cardiac  Enzymes  Recent Labs Lab 04/21/17 0138 04/23/17 1700 04/23/17 2205 04/24/17 0330  TROPONINI 3.82* 4.39* 4.04* 3.61*     Recent Labs Lab 04/20/17 1116  TROPIPOC 2.05*     BNP  Recent Labs Lab 04/21/17 1357  BNP 1,528.1*    Radiology    Dg Chest Port 1 View Result Date: 04/23/2017 CLINICAL DATA:  Respiratory failure, evaluate endotracheal tube position EXAM: PORTABLE CHEST 1 VIEW COMPARISON:  Portable chest x-ray of 04/22/2017 FINDINGS: The tip of the endotracheal tube is approximately 1.9 cm above the carina. Left central venous line tip overlies the lower SVC. Minimal pulmonary vascular congestion is present. Small pleural effusions cannot be excluded. Heart size is stable. IMPRESSION: 1. Tip of endotracheal tube 1.9 cm above the carina. 2. Little change in probable mild pulmonary vascular congestion and small effusions. Electronically Signed   By: Ivar Drape M.D.   On: 04/23/2017 11:09   Dg Chest Port 1 View - no CXR 07/10 Result Date: 04/22/2017 CLINICAL DATA:  Initial evaluation for acute respiratory failure with hypoxia. EXAM: PORTABLE CHEST 1 VIEW COMPARISON:  Prior radiograph from 04/21/2017. FINDINGS: Patient remains intubated with the tip of the endotracheal tube positioned approximately 2.7 cm above the carina. Left IJ approach centra venous catheter remains in place with tip overlying the distal SVC. Enteric tube courses in the the abdomen. Stable cardiac and mediastinal silhouettes. Lungs mildly hypoinflated. Similar bilateral coarse interstitial opacities. Slightly worsened hazy density within the peripheral right lung base. Suspected small right pleural effusion. No pulmonary edema. No pneumothorax. Osseous structures unchanged. IMPRESSION: 1. Support apparatus in satisfactory position as above. 2. Similar coarse bilateral interstitial opacities, which may reflect sequelae of underlying infection in the correct clinical setting. Slightly increased hazy opacity at the  peripheral right lung base as compared to previous. 3. Suspected small right pleural effusion. Electronically Signed  By: Jeannine Boga M.D.   On: 04/22/2017 06:53   Cardiac Studies   Echo 04/21/2017 - Left ventricle: The cavity size was normal. Wall thickness was   normal. Systolic function was mildly reduced. The estimated   ejection fraction was 45%. - Regional wall motion abnormality: Hypokinesis of the basal-mid   anteroseptal, basal-mid inferoseptal, basal-mid inferior, and   apical septal myocardium. - Mitral valve: There was moderate regurgitation. - Right ventricle: Systolic function was mildly reduced. - Tricuspid valve: There was mild regurgitation.  Patient Profile     70 y.o. female who presented with progressive weakness and ongoing abdominal complaints.  In the ED she was hypotensive.  Creat is acutely elevated and lactic acid is elevated.  WBC is 27.7.   Her BP has responded to hydration.  We are consulted secondary to a troponin of 2.05.    Assessment & Plan    HYPOTENSION:  Suspect septic shock.  WBC is even higher today .  ? Infection vs. Leukemia  She is still net + for this admission I/O + 16.1 liters.  BP was too low to try lasix yesterday  Creatinine continue to increase I think she could receive lasix at this point .    2.  Acute systolic CHF:   EF 66%. Continue pressors for support   3.  ELEVATED TROPONIN:  Likely secondary to her acute illness.   Doubt this is ACS .  Will DC heparin    Mertie Moores, MD  04/25/2017 9:00 AM    [address],  [place], [place]  [address] Pager [phone] Phone: [phone]; Fax: [phone]

## 2017-04-25 NOTE — Progress Notes (Signed)
ANTICOAGULATION CONSULT NOTE - Follow Up Consult  Pharmacy Consult for heparin Indication: chest pain/ACS  Allergies  Allergen Reactions  . Bee Venom Anaphylaxis and Hives    Patient Measurements: Height: 4\' 10"  (147.3 cm) Weight: 120 lb 9.5 oz (54.7 kg) IBW/kg (Calculated) : 40.9 Heparin Dosing Weight: 43 kg  Vital Signs: Temp: 98 F (36.7 C) (07/12 0807) Temp Source: Oral (07/12 0807) BP: 116/50 (07/12 0800) Pulse Rate: 50 (07/12 0615)  Labs:  Recent Labs  04/23/17 0745 04/23/17 1046 04/23/17 1700 04/23/17 2205 04/24/17 0330 04/25/17 0433  HGB 8.7*  --   --   --  7.9* 8.1*  HCT 27.4*  --   --   --  25.3* 25.9*  PLT 542*  --   --   --  532* 602*  APTT 68*  --   --   --   --   --   HEPARINUNFRC  --  0.30  --   --  0.37 0.45  CREATININE 1.50*  --   --   --  1.35* 1.53*  TROPONINI  --   --  4.39* 4.04* 3.61*  --     Estimated Creatinine Clearance: 24 mL/min (A) (by C-G formula based on SCr of 1.53 mg/dL (H)).  Assessment: CC/HPI: 71 yo F presenting to ED w/ persistent N/V and weakness, sepsis r/o   PMH: anxiety, carotid stenosis, fibromyalgia, HLD, neuropathy, osteoporosis, syncope  Anticoag: none pta on hep gtt 700 units/hr therapeutic levels x 3  Renal: AKI SCr SCr 1.53, Ca 6.2 (8)  Heme/Onc: H&H 8.1/25.9, Plt 605  Goal of Therapy:  Heparin level 0.3-0.7 units/ml Monitor platelets by anticoagulation protocol: Yes   Plan:  Heparin 700 units/hrs Daily HL, CBC  Levester Fresh, PharmD, BCPS, BCCCP Clinical Pharmacist Clinical phone for 04/25/2017 from 7a-3:30p: [phone] If after 3:30p, please call main pharmacy at: [phone] 04/25/2017 8:24 AM

## 2017-04-25 NOTE — Progress Notes (Signed)
PULMONARY / CRITICAL CARE MEDICINE   Name: Alexandra House MRN: [id number] DOB: 1946-06-06    ADMISSION DATE:  04/20/2017 REFERRING MD:Dr. Aileen Fass   CHIEF COMPLAINT:AMS, Hypotension, Hypothermia, Bradycardia   HISTORY OF PRESENT ILLNESS: [name] who initially presented with bradycardia in the 30's, hypotension (79/39), tachypnea, hypothermic. She was treated with IVF with improvement in BP. The patient was admitted per [facility] and treated for suspected sepsis. She developed worsening hypotension, bradycardia, altered mental status and increased work of breathing on 7/8. PCCM called back for re-evaluation. Currently intubated and on pressors.   SUBJECTIVE: No acute overnight events. Did well on CPAP 5/5 for most of day yesterday.  Levophed down to 2 this AM. Currently weaning at 5/5.   VITAL SIGNS: BP (!) 103/45   Pulse (!) 50   Temp 98.1 F (36.7 C) (Oral)   Resp (!) 27   Ht '4\' 10"'  (1.473 m)   Wt 120 lb 9.5 oz (54.7 kg)   SpO2 100%   BMI 25.20 kg/m   HEMODYNAMICS: CVP:  [10 mmHg-16 mmHg] 13 mmHg  VENTILATOR SETTINGS: Vent Mode: PRVC FiO2 (%):  [40 %] 40 % Set Rate:  [30 bmp] 30 bmp Vt Set:  [360 mL] 360 mL PEEP:  [5 cmH20] 5 cmH20 Pressure Support:  [5 cmH20-10 cmH20] 10 cmH20 Plateau Pressure:  [17 cmH20-18 cmH20] 17 cmH20  INTAKE / OUTPUT: I/O last 3 completed shifts: In: 3500.7 [I.V.:1460.7; NG/GT:1520; IV Piggyback:520] Out: 835 [Urine:835]  PHYSICAL EXAMINATION: General: [name] intubated sitting up in bed appearing comfortable and in NAD Neuro: alert, responds to commands HEENT: AT/[place], MMM, PERRL Cardiovascular: RRR, no MRG Lungs: CTABL Abdomen: +BS, soft, NTND Musculoskeletal: No gross deformities Extremities: significant edema in upper extremities  LABS:  BMET  Recent Labs Lab 04/23/17 0745 04/24/17 0330 04/25/17 0433  NA 142 141 138  K 3.8 4.3 4.4  CL 115* 116* 113*  CO2 18* 18* 18*  BUN 34* 35* 38*  CREATININE 1.50* 1.35*  1.53*  GLUCOSE 152* 121* 137*    Electrolytes  Recent Labs Lab 04/20/17 1340 04/20/17 2004  04/23/17 0745 04/24/17 0330 04/25/17 0433  CALCIUM  --   --   < > 5.6* 6.0* 6.2*  MG 1.8 1.9  --   --   --   --   PHOS 5.2* 4.7*  --   --   --   --   < > = values in this interval not displayed.  CBC  Recent Labs Lab 04/23/17 0745 04/24/17 0330 04/25/17 0433  WBC 38.7* 36.7* 37.8*  HGB 8.7* 7.9* 8.1*  HCT 27.4* 25.3* 25.9*  PLT 542* 532* 602*    Coag's  Recent Labs Lab 04/20/17 1050 04/23/17 0745  APTT  --  68*  INR 1.10  --     Sepsis Markers  Recent Labs Lab 04/20/17 1340  04/21/17 1755 04/22/17 1700 04/23/17 1046 04/24/17 1017  LATICACIDVEN  --   < > 6.4* 2.3*  --  0.8  PROCALCITON 1.07  --   --   --  0.72  --   < > = values in this interval not displayed.  ABG  Recent Labs Lab 04/21/17 1220 04/21/17 1557  PHART 7.331* 7.432  PCO2ART BELOW REPORTABLE RANGE 26.2*  PO2ART 121.00* 158*    Liver Enzymes  Recent Labs Lab 04/21/17 0138 04/21/17 1357 04/23/17 1046  AST 142* 195* 70*  ALT 54 71* 76*  ALKPHOS 168* 142* 155*  BILITOT 0.7 0.5 0.2*  ALBUMIN  1.7* 1.8* 1.7*    Cardiac Enzymes  Recent Labs Lab 04/23/17 1700 04/23/17 2205 04/24/17 0330  TROPONINI 4.39* 4.04* 3.61*    Glucose  Recent Labs Lab 04/24/17 0421 04/24/17 0802 04/24/17 1208 04/24/17 1625 04/24/17 2009 04/24/17 2328  GLUCAP 105* 116* 144* 123* 146* 150*    Imaging Dg Chest Port 1 View  Result Date: 04/24/2017 CLINICAL DATA:  Intubation EXAM: PORTABLE CHEST 1 VIEW COMPARISON:  Portable exam 1388 hours compared to 04/23/2017 FINDINGS: Tip of endotracheal tube projects 3.4 cm above carina. Nasogastric tube extends into stomach. LEFT jugular central venous catheter tip projects over cavoatrial junction. Normal heart size, mediastinal contours, and pulmonary vascularity. Atherosclerotic calcification at aortic arch. Emphysematous and bronchitic changes with biapical  scarring greater on RIGHT. Scattered interstitial infiltrates likely pulmonary edema. Minimal bibasilar atelectasis. No pneumothorax. Bones demineralized. IMPRESSION: Line and tube positions as above Emphysematous changes with bibasilar atelectasis and probable mild pulmonary edema. Aortic Atherosclerosis (ICD10-I70.0) and Emphysema (ICD10-J43.9). Electronically Signed   By: Lavonia Dana M.D.   On: 04/24/2017 09:11     STUDIES: 7/05 [place] ABD >>abdominal US on 7/5 which was negative for gallstones, hepatic abnormality, normal pancreas &no hydronephrosis.  7/7 CT chest/abdomen/pelvis> emphysema, mult-lobar pneumonia, fatty liver, fat stranding around head of pancreas 7/11 CXR: Emphysematous changes with bibasilar atelectasis and probable mild pulmonary edema.  CULTURES: BCx2 7/7, 7/10 >> UC 7/7 >> resp culture 7/8 > Ucx: 7/7>> negative  ANTIBIOTICS: Zosyn 7/7 >> Vanco 7/7 >>7/10  SIGNIFICANT EVENTS: 7/07 Admit with N/V, weakness, hypotension  7/8 >> intubated  LINES/TUBES: ETT 7/8 >> CVC LIJ 7/8 >> Urethral cath 7/8>> NGT 7/8>>  DISCUSSION: 71 y/o female septic shock, aspiration and elevated troponin likely 2/2 demand ischemia vs: NSTEMI  ASSESSMENT / PLAN:  PULMONARY A: Acute Respiratory Failure with hypoxemia due to aspiration pneumonia  Centrilobular Emphysema  P: Wean vent as tolerated today VAP prevention Daily WUA/SBT Start brovana/pulmicort, mucinex Start duoneb  CARDIOVASCULAR A:  Bradycardia  Elevated Troponin - suspect demand ischemia, 4.04>3.6 Hx Grade II Diastolic Dysfunction, LVEF Hx Carotid Stenosis  Shock: presumably septic vs cardiogenic P: Continue heparin Levophed titrated to MAP > 65, MAP 63 Tele Lasix if BP can tolerate Recheck echo in one week  RENAL A:  AKI Non-gap metabolic acidosis Hypokalemia, resolved Hypocalcemia: 6.2 P: Monitor BMET and UOP Replace electrolytes as needed Calcium gluconate  1g  GASTROINTESTINAL A:  Nausea / Vomiting, Weight Loss - ? If related to Prolia  Elevated alk phos without evidence of biliary obstruction P: NPO / OGT Pepcid for SUP  Cont tube feeds GI following; no recommendations at this time.  Consider abd xray  Lipase/amylase WNL  HEMATOLOGIC A:  Anemia, stable. Hgb 8,1 P: Monitor for bleeding Transfusion threshold <8 Cont heparin drip (0.45)  INFECTIOUS A:  Aspiration pneumonia P: F/u resp culture Continue zosyn  ENDOCRINE A:  Hyperglycemia  P: SSI  NEUROLOGIC A:  Mild confusion/acute encephalopathy due to sepsis/shock P: RASS goal: -1 Fentanyl gtt Versed prn SBT daily ativan 69m BID PRN    FAMILY - Updates: updated husband and son at bedside at length  - Inter-disciplinary family meet or Palliative Care meeting due by: day 7   Pulmonary and CBieberPager: [phone] 04/25/2017, 7:22 AM

## 2017-04-25 NOTE — Procedures (Signed)
Extubation Procedure Note  Patient Details:   Name: BAO COREAS DOB: 05-09-1946 MRN: [id number]   Airway Documentation:  Airway 7.5 mm (Active)  Secured at (cm) 20 cm 04/25/2017  8:32 AM  Measured From Lips 04/25/2017  8:32 AM  [place] 04/25/2017  8:32 AM  Secured By [employer] 04/25/2017  8:32 AM  Tube Holder Repositioned Yes 04/25/2017  8:32 AM  Cuff Pressure (cm H2O) 22 cm H2O 04/25/2017  8:32 AM  Site Condition Dry 04/25/2017  4:00 AM    Evaluation  O2 sats: stable throughout and currently acceptable Complications: No apparent complications Patient did tolerate procedure well. Bilateral Breath Sounds: Clear   Yes  Miquel Dunn 04/25/2017, 12:25 PM

## 2017-04-25 NOTE — Progress Notes (Signed)
50cc Fentanyl wasted in the sink with Josue Hector, RN

## 2017-04-25 NOTE — Progress Notes (Signed)
Subjective: No complaints.    Objective: Vital signs in last 24 hours: Temp:  [97.3 F (36.3 C)-98.3 F (36.8 C)] 97.3 F (36.3 C) (07/12 1537) Pulse Rate:  [46-65] 49 (07/12 1600) Resp:  [0-32] 25 (07/12 1600) BP: (70-127)/(38-72) 75/40 (07/12 1600) SpO2:  [89 %-100 %] 97 % (07/12 1600) FiO2 (%):  [30 %-40 %] 40 % (07/12 1200) Weight:  [54.7 kg (120 lb 9.5 oz)] 54.7 kg (120 lb 9.5 oz) (07/12 0315) Last BM Date: 04/25/17  Intake/Output from previous day: 07/11 0701 - 07/12 0700 In: 2569 [I.V.:1169; NG/GT:1080; IV Piggyback:320] Out: 387 [Urine:387] Intake/Output this shift: Total I/O In: 476 [I.V.:158.6; Other:50; NG/GT:167.3; IV Piggyback:100] Out: 480 [Urine:460; Emesis/NG output:20]  General appearance: arousable, weak, but responsive Resp: clear to auscultation bilaterally Cardio: regular rate and rhythm GI: soft, non-tender; bowel sounds normal; no masses,  no organomegaly Extremities: edematous upper extremities  Lab Results:  Recent Labs  04/23/17 0745 04/24/17 0330 04/25/17 0433  WBC 38.7* 36.7* 37.8*  HGB 8.7* 7.9* 8.1*  HCT 27.4* 25.3* 25.9*  PLT 542* 532* 602*   BMET  Recent Labs  04/23/17 0745 04/24/17 0330 04/25/17 0433  NA 142 141 138  K 3.8 4.3 4.4  CL 115* 116* 113*  CO2 18* 18* 18*  GLUCOSE 152* 121* 137*  BUN 34* 35* 38*  CREATININE 1.50* 1.35* 1.53*  CALCIUM 5.6* 6.0* 6.2*   LFT  Recent Labs  04/23/17 1046  PROT 4.8*  ALBUMIN 1.7*  AST 70*  ALT 76*  ALKPHOS 155*  BILITOT 0.2*  BILIDIR <0.1*  IBILI NOT CALCULATED   PT/INR No results for input(s): LABPROT, INR in the last 72 hours. Hepatitis Panel No results for input(s): HEPBSAG, HCVAB, HEPAIGM, HEPBIGM in the last 72 hours. C-Diff No results for input(s): CDIFFTOX in the last 72 hours. Fecal Lactopherrin No results for input(s): FECLLACTOFRN in the last 72 hours.  Studies/Results: Dg Chest Port 1 View  Result Date: 04/25/2017 CLINICAL DATA:  Respiratory failure  EXAM: PORTABLE CHEST 1 VIEW COMPARISON:  04/24/2017 FINDINGS: Support devices are stable. There is hyperinflation of the lungs compatible with COPD. She bilateral perihilar and lower lobe opacities are noted which could reflect edema or pneumonia. Small bilateral effusions. IMPRESSION: Bilateral perihilar and lower lobe opacities could reflect edema or pneumonia. COPD. Electronically Signed   By: Rolm Baptise M.D.   On: 04/25/2017 08:08   Dg Chest Port 1 View  Result Date: 04/24/2017 CLINICAL DATA:  Intubation EXAM: PORTABLE CHEST 1 VIEW COMPARISON:  Portable exam 0857 hours compared to 04/23/2017 FINDINGS: Tip of endotracheal tube projects 3.4 cm above carina. Nasogastric tube extends into stomach. LEFT jugular central venous catheter tip projects over cavoatrial junction. Normal heart size, mediastinal contours, and pulmonary vascularity. Atherosclerotic calcification at aortic arch. Emphysematous and bronchitic changes with biapical scarring greater on RIGHT. Scattered interstitial infiltrates likely pulmonary edema. Minimal bibasilar atelectasis. No pneumothorax. Bones demineralized. IMPRESSION: Line and tube positions as above Emphysematous changes with bibasilar atelectasis and probable mild pulmonary edema. Aortic Atherosclerosis (ICD10-I70.0) and Emphysema (ICD10-J43.9). Electronically Signed   By: Lavonia Dana M.D.   On: 04/24/2017 09:11    Medications:  Scheduled: . arformoterol  15 mcg Nebulization BID  . aspirin  325 mg Per Tube Daily  . budesonide (PULMICORT) nebulizer solution  0.5 mg Nebulization BID  . chlorhexidine gluconate (MEDLINE KIT)  15 mL Mouth Rinse BID  . Chlorhexidine Gluconate Cloth  6 each Topical Daily  . fentaNYL (SUBLIMAZE) injection  50 mcg Intravenous  Once  . furosemide  40 mg Intravenous Q12H  . guaiFENesin  600 mg Oral BID  . heparin subcutaneous  5,000 Units Subcutaneous Q8H  . mouth rinse  15 mL Mouth Rinse QID  . pantoprazole (PROTONIX) IV  40 mg Intravenous  Q12H  . sodium chloride flush  10-40 mL Intracatheter Q12H  . sodium chloride flush  3 mL Intravenous Q12H  . white petrolatum       Continuous: . sodium chloride 10 mL/hr at 04/25/17 1600  . feeding supplement (VITAL AF 1.2 CAL) Stopped (04/25/17 1111)  . fentaNYL infusion INTRAVENOUS Stopped (04/25/17 1111)  . norepinephrine (LEVOPHED) Adult infusion 5 mcg/min (04/25/17 1608)  . piperacillin-tazobactam (ZOSYN)  IV Stopped (04/25/17 1745)    Assessment/Plan: 1) Leukocytosis. 2) Hypotension. 3) AKI.   She was extubated today and she is improving, but very slowly.  Currently on Levophed and dobutamine was discontinued.  She is very fatigued and weak at this time, but she denies any of the original complaints of nausea and vomiting.  Leukocytosis is still significant.  Plan: 1) Continue with supportive care. 2) I will sign off for now as there is no acute GI issue, but call if there are any questions.  LOS: 5 days   Lasundra Hascall D 04/25/2017, 4:11 PM

## 2017-04-25 NOTE — Progress Notes (Signed)
CRITICAL VALUE ALERT  Critical Value:  Ca 6.2  Date & Time Notied:  04/25/2017 05:23  Provider Notified: Elder Cyphers.  Orders Received/Actions taken: Calcium replacement

## 2017-04-26 ENCOUNTER — Inpatient Hospital Stay (HOSPITAL_COMMUNITY): Payer: Medicare HMO

## 2017-04-26 DIAGNOSIS — R579 Shock, unspecified: Secondary | ICD-10-CM

## 2017-04-26 LAB — BLOOD GAS, ARTERIAL
ACID-BASE DEFICIT: 11.8 mmol/L — AB (ref 0.0–2.0)
Acid-base deficit: 16.2 mmol/L — ABNORMAL HIGH (ref 0.0–2.0)
Bicarbonate: 9.9 mmol/L — ABNORMAL LOW (ref 20.0–28.0)
Bicarbonate: 13.1 mmol/L — ABNORMAL LOW (ref 20.0–28.0)
DRAWN BY: 290171
DRAWN BY: 313061
FIO2: 50
FIO2: 50
LHR: 16 {breaths}/min
MECHVT: 360 mL
MECHVT: 360 mL
O2 SAT: 95 %
O2 SAT: 98.2 %
PATIENT TEMPERATURE: 98.6
PATIENT TEMPERATURE: 97.1
PCO2 ART: 24.4 mmHg — AB (ref 32.0–48.0)
PCO2 ART: 25.1 mmHg — AB (ref 32.0–48.0)
PEEP: 5 cmH2O
PEEP: 5 cmH2O
PH ART: 7.333 — AB (ref 7.350–7.450)
PO2 ART: 97.4 mmHg (ref 83.0–108.0)
PO2 ART: 126 mmHg — AB (ref 83.0–108.0)
RATE: 16 resp/min
pH, Arterial: 7.231 — ABNORMAL LOW (ref 7.350–7.450)

## 2017-04-26 LAB — POCT I-STAT 3, ART BLOOD GAS (G3+)
Acid-base deficit: 14 mmol/L — ABNORMAL HIGH (ref 0.0–2.0)
BICARBONATE: 10.4 mmol/L — AB (ref 20.0–28.0)
O2 Saturation: 98 %
PCO2 ART: 18.6 mmHg — AB (ref 32.0–48.0)
TCO2: 11 mmol/L (ref 0–100)
pH, Arterial: 7.352 (ref 7.350–7.450)
pO2, Arterial: 109 mmHg — ABNORMAL HIGH (ref 83.0–108.0)

## 2017-04-26 LAB — BASIC METABOLIC PANEL
ANION GAP: 15 (ref 5–15)
BUN: 48 mg/dL — ABNORMAL HIGH (ref 6–20)
CALCIUM: 6.7 mg/dL — AB (ref 8.9–10.3)
CO2: 13 mmol/L — AB (ref 22–32)
Chloride: 110 mmol/L (ref 101–111)
Creatinine, Ser: 1.93 mg/dL — ABNORMAL HIGH (ref 0.44–1.00)
GFR, EST AFRICAN AMERICAN: 29 mL/min — AB (ref >60)
GFR, EST NON AFRICAN AMERICAN: 25 mL/min — AB (ref >60)
Glucose, Bld: 91 mg/dL (ref 65–99)
POTASSIUM: 5.3 mmol/L — AB (ref 3.5–5.1)
Sodium: 138 mmol/L (ref 135–145)

## 2017-04-26 LAB — LACTIC ACID, PLASMA
Lactic Acid, Venous: 6 mmol/L (ref 0.5–1.9)
Lactic Acid, Venous: 7.6 mmol/L (ref 0.5–1.9)
Lactic Acid, Venous: 2.5 mmol/L (ref 0.5–1.9)

## 2017-04-26 LAB — TROPONIN I
Troponin I: 2.87 ng/mL (ref <0.03)
Troponin I: 2.28 ng/mL (ref <0.03)

## 2017-04-26 LAB — GLUCOSE, CAPILLARY
GLUCOSE-CAPILLARY: 151 mg/dL — AB (ref 65–99)
Glucose-Capillary: 184 mg/dL — ABNORMAL HIGH (ref 65–99)
Glucose-Capillary: 177 mg/dL — ABNORMAL HIGH (ref 65–99)
Glucose-Capillary: 158 mg/dL — ABNORMAL HIGH (ref 65–99)

## 2017-04-26 LAB — CBC
HEMATOCRIT: 27.4 % — AB (ref 36.0–46.0)
HEMOGLOBIN: 8.5 g/dL — AB (ref 12.0–15.0)
MCH: 27.4 pg (ref 26.0–34.0)
MCHC: 31 g/dL (ref 30.0–36.0)
MCV: 88.4 fL (ref 78.0–100.0)
Platelets: 564 10*3/uL — ABNORMAL HIGH (ref 150–400)
RBC: 3.1 MIL/uL — ABNORMAL LOW (ref 3.87–5.11)
RDW: 16.8 % — AB (ref 11.5–15.5)
WBC: 38.3 10*3/uL — ABNORMAL HIGH (ref 4.0–10.5)

## 2017-04-26 LAB — URINALYSIS, ROUTINE W REFLEX MICROSCOPIC
Bilirubin Urine: NEGATIVE
Glucose, UA: NEGATIVE mg/dL
Hgb urine dipstick: NEGATIVE
KETONES UR: NEGATIVE mg/dL
LEUKOCYTES UA: NEGATIVE
NITRITE: NEGATIVE
PROTEIN: NEGATIVE mg/dL
Specific Gravity, Urine: 1.015 (ref 1.005–1.030)
pH: 5 (ref 5.0–8.0)

## 2017-04-26 LAB — PHOSPHORUS: Phosphorus: 2.9 mg/dL (ref 2.5–4.6)

## 2017-04-26 LAB — COOXEMETRY PANEL
CARBOXYHEMOGLOBIN: 0.7 % (ref 0.5–1.5)
Methemoglobin: 1.2 % (ref 0.0–1.5)
O2 SAT: 35.3 %
TOTAL HEMOGLOBIN: 8.2 g/dL — AB (ref 12.0–16.0)

## 2017-04-26 LAB — CORTISOL: Cortisol, Plasma: 32.2 ug/dL

## 2017-04-26 MED ORDER — MIDAZOLAM HCL 2 MG/2ML IJ SOLN
1.0000 mg | INTRAMUSCULAR | Status: DC | PRN
  Administered 2017-04-27: 1 mg via INTRAVENOUS
  Filled 2017-04-26: qty 2

## 2017-04-26 MED ORDER — HYDROCORTISONE NA SUCCINATE PF 100 MG IJ SOLR
50.0000 mg | Freq: Four times a day (QID) | INTRAMUSCULAR | Status: DC
  Administered 2017-04-26 – 2017-04-29 (×12): 50 mg via INTRAVENOUS
  Filled 2017-04-26 (×14): qty 1

## 2017-04-26 MED ORDER — FAMOTIDINE IN NACL 20-0.9 MG/50ML-% IV SOLN
20.0000 mg | Freq: Two times a day (BID) | INTRAVENOUS | Status: DC
  Filled 2017-04-26: qty 50

## 2017-04-26 MED ORDER — FUROSEMIDE 10 MG/ML IJ SOLN
80.0000 mg | Freq: Once | INTRAMUSCULAR | Status: AC
  Administered 2017-04-26: 80 mg via INTRAVENOUS
  Filled 2017-04-26: qty 8

## 2017-04-26 MED ORDER — FENTANYL CITRATE (PF) 100 MCG/2ML IJ SOLN: 50.0000 ug | Freq: Once | INTRAMUSCULAR | Status: DC

## 2017-04-26 MED ORDER — FUROSEMIDE 10 MG/ML IJ SOLN
60.0000 mg | Freq: Once | INTRAMUSCULAR | Status: AC
  Administered 2017-04-26: 60 mg via INTRAVENOUS
  Filled 2017-04-26: qty 6

## 2017-04-26 MED ORDER — SODIUM BICARBONATE 8.4 % IV SOLN
50.0000 meq | Freq: Once | INTRAVENOUS | Status: AC
  Administered 2017-04-26: 50 meq via INTRAVENOUS
  Filled 2017-04-26: qty 50

## 2017-04-26 MED ORDER — SODIUM CHLORIDE 0.9 % IV SOLN
1.0000 g | Freq: Once | INTRAVENOUS | Status: AC
  Administered 2017-04-26: 1 g via INTRAVENOUS
  Filled 2017-04-26: qty 10

## 2017-04-26 MED ORDER — DOBUTAMINE-DEXTROSE 2-5 MG/ML-% IV SOLN
5.0000 ug/kg/min | INTRAVENOUS | Status: DC
  Filled 2017-04-26: qty 250

## 2017-04-26 MED ORDER — SODIUM CHLORIDE 0.9 % IV SOLN
1.0000 g | Freq: Two times a day (BID) | INTRAVENOUS | Status: DC
  Administered 2017-04-26 – 2017-04-30 (×9): 1 g via INTRAVENOUS
  Filled 2017-04-26 (×9): qty 1

## 2017-04-26 MED ORDER — MIDAZOLAM HCL 2 MG/2ML IJ SOLN
1.0000 mg | INTRAMUSCULAR | Status: DC | PRN
  Administered 2017-04-26: 1 mg via INTRAVENOUS
  Filled 2017-04-26 (×2): qty 2

## 2017-04-26 MED ORDER — VANCOMYCIN 50 MG/ML ORAL SOLUTION
500.0000 mg | Freq: Four times a day (QID) | ORAL | Status: DC
  Administered 2017-04-26 – 2017-04-28 (×8): 500 mg
  Filled 2017-04-26 (×11): qty 10

## 2017-04-26 MED ORDER — FENTANYL BOLUS VIA INFUSION
25.0000 ug | INTRAVENOUS | Status: DC | PRN
  Filled 2017-04-26: qty 25

## 2017-04-26 MED ORDER — SODIUM BICARBONATE 8.4 % IV SOLN
INTRAVENOUS | Status: DC
  Administered 2017-04-26 – 2017-04-27 (×3): via INTRAVENOUS
  Filled 2017-04-26 (×4): qty 150

## 2017-04-26 MED ORDER — SUCCINYLCHOLINE CHLORIDE 20 MG/ML IJ SOLN
100.0000 mg | Freq: Once | INTRAMUSCULAR | Status: AC
  Administered 2017-04-26: 100 mg via INTRAVENOUS

## 2017-04-26 MED ORDER — LINEZOLID 600 MG/300ML IV SOLN
600.0000 mg | Freq: Two times a day (BID) | INTRAVENOUS | Status: DC
  Administered 2017-04-26 – 2017-04-30 (×9): 600 mg via INTRAVENOUS
  Filled 2017-04-26 (×9): qty 300

## 2017-04-26 MED ORDER — METOCLOPRAMIDE HCL 5 MG/ML IJ SOLN
5.0000 mg | Freq: Once | INTRAMUSCULAR | Status: AC
Start: 2017-04-26 — End: 2017-04-26
  Administered 2017-04-26: 5 mg via INTRAVENOUS
  Filled 2017-04-26: qty 1

## 2017-04-26 MED ORDER — IOPAMIDOL (ISOVUE-300) INJECTION 61%
INTRAVENOUS | Status: AC
  Administered 2017-04-26: 30 mL
  Filled 2017-04-26: qty 30

## 2017-04-26 MED ORDER — FENTANYL 2500MCG IN NS 250ML (10MCG/ML) PREMIX INFUSION
25.0000 ug/h | INTRAVENOUS | Status: DC
  Administered 2017-04-26: 50 ug/h via INTRAVENOUS
  Administered 2017-04-28 (×2): 125 ug/h via INTRAVENOUS
  Filled 2017-04-26 (×3): qty 250

## 2017-04-26 MED ORDER — ETOMIDATE 2 MG/ML IV SOLN
20.0000 mg | Freq: Once | INTRAVENOUS | Status: AC
  Administered 2017-04-26: 20 mg via INTRAVENOUS

## 2017-04-26 MED ORDER — SODIUM CHLORIDE 0.9 % IV SOLN: INTRAVENOUS | Status: DC | PRN

## 2017-04-26 MED ORDER — ORAL CARE MOUTH RINSE
15.0000 mL | Freq: Four times a day (QID) | OROMUCOSAL | Status: DC
Start: 2017-04-26 — End: 2017-04-29
  Administered 2017-04-26 – 2017-04-29 (×12): 15 mL via OROMUCOSAL

## 2017-04-26 MED ORDER — CHLORHEXIDINE GLUCONATE 0.12% ORAL RINSE (MEDLINE KIT)
15.0000 mL | Freq: Two times a day (BID) | OROMUCOSAL | Status: DC
  Administered 2017-04-26 – 2017-04-29 (×7): 15 mL via OROMUCOSAL

## 2017-04-26 MED ORDER — DOCUSATE SODIUM 50 MG/5ML PO LIQD: 100.0000 mg | Freq: Two times a day (BID) | ORAL | Status: DC | PRN

## 2017-04-26 MED ORDER — FUROSEMIDE 10 MG/ML IJ SOLN
80.0000 mg | Freq: Three times a day (TID) | INTRAMUSCULAR | Status: DC
  Administered 2017-04-26 – 2017-04-27 (×3): 80 mg via INTRAVENOUS
  Filled 2017-04-26 (×3): qty 8

## 2017-04-26 MED ORDER — DOBUTAMINE IN D5W 4-5 MG/ML-% IV SOLN
2.5000 ug/kg/min | INTRAVENOUS | Status: DC
  Administered 2017-04-26: 15 ug/kg/min via INTRAVENOUS
  Administered 2017-04-26: 5 ug/kg/min via INTRAVENOUS
  Filled 2017-04-26 (×2): qty 250

## 2017-04-26 NOTE — Consult Note (Signed)
Alexandra House is an 71 y.o. female referred by Dr Vaughan Browner   Chief Complaint: ARF, met acidosis, mild hyperkalemia HPI: [name] admitted 04/21/17 after presenting to ER with several weeks of N/V and wt loss and then found to have significant bradycardia requiring transcutaneous pacer.  Baseline Scr around 1 on 04/14/17 but on admission Scr was 3.09.  Scr progressively improved to 1.35 on 7/11/8 and since has increased to 1.93 in setting of hypotension with SBP's in the 70's.  Was intubated initially then extubated yest but required reintubation today.  There has been concerns of sepsis syndrome but cultures negative, remians on AB.  UO has been in the 400-800cc/d range since admission.  Abd [place] on 04/18/17 to wu N/V showed 2 kidneys, 7.8cm and 8.1cm with no hydro.   Past Medical History:  Diagnosis Date  . Anxiety   . Carotid stenosis   . Fibromyalgia   . HLD (hyperlipidemia)   . Hx of cardiovascular stress test    a. Lex MV 11/13: + significant ECG changes and CHB with infusion; EF 81%, no ischemia,   . Hx of echocardiogram    a. Echo 12/12: EF 16-10%, grade 2 diastolic dysfunction, mild MR.; b. Echo 11/13:  mild LVH, EF 60-65%  . Neuropathy   . Osteoporosis   . Syncope 08/2009   a. ETT-Echo 1/11:  poor ex tol, submax exercise, no WMA or ECG changes c/w ischemia;      Past Surgical History:  Procedure Laterality Date  . None      Family History  Problem Relation Age of Onset  . CAD Mother 14       Status post PCI  . Lung cancer Father   . Colon cancer Neg Hx    Social History:  reports that she has been smoking Cigarettes.  She has been smoking about 0.50 packs per day. She uses smokeless tobacco. She reports that she drinks alcohol. She reports that she does not use drugs.  Allergies:  Allergies  Allergen Reactions  . Bee Venom Anaphylaxis and Hives    Medications Prior to Admission  Medication Sig Dispense Refill  . benzonatate (TESSALON) 100 MG capsule Take 100 mg by mouth every  8 (eight) hours as needed for cough.  0  . BREO ELLIPTA 100-25 MCG/INH AEPB Inhale 1 puff into the lungs daily.  1  . budesonide-formoterol (SYMBICORT) 160-4.5 MCG/ACT inhaler Inhale 2 puffs into the lungs as needed.    . calcium carbonate (OS-CAL) 600 MG TABS Take 600 mg by mouth 2 (two) times daily with a meal.    . denosumab (PROLIA) 60 MG/ML SOLN injection Inject 60 mg into the skin every 6 (six) months. Administer in upper arm, thigh, or abdomen    . ENSURE PLUS (ENSURE PLUS) LIQD Take 237 mLs by mouth daily.    Marland Kitchen HYDROcodone-homatropine (HYCODAN) 5-1.5 MG/5ML syrup Take 5 mLs by mouth every 6 (six) hours as needed for cough.     Marland Kitchen LORazepam (ATIVAN) 1 MG tablet Take 1 mg by mouth 2 (two) times daily as needed for anxiety.     . Multiple Vitamins-Minerals (MULTIVITAMIN GUMMIES ADULT PO) Take 2 tablets by mouth daily.    Marland Kitchen omeprazole (PRILOSEC) 40 MG capsule Take 40 mg by mouth daily.  0  . ondansetron (ZOFRAN) 4 MG tablet Take 1 tablet (4 mg total) by mouth every 8 (eight) hours as needed for nausea or vomiting. 12 tablet 0  . ranitidine (ZANTAC) 150 MG capsule Take  1 capsule (150 mg total) by mouth daily. 30 capsule 0  . albuterol (PROVENTIL HFA;VENTOLIN HFA) 108 (90 BASE) MCG/ACT inhaler Inhale 2 puffs into the lungs every 6 (six) hours as needed for wheezing or shortness of breath.     . EPINEPHrine (EPIPEN JR) 0.15 MG/0.3ML injection Inject 0.15 mg into the muscle as needed for anaphylaxis.     Marland Kitchen ibuprofen (ADVIL,MOTRIN) 200 MG tablet Take 200-400 mg by mouth every 6 (six) hours as needed for mild pain.     . pravastatin (PRAVACHOL) 40 MG tablet Take 1 tablet (40 mg total) by mouth every evening. (Patient not taking: Reported on 04/20/2017) 30 tablet 11     Lab Results: UA: Benign on 04/26/17  Recent Labs  04/24/17 0330 04/25/17 0433 04/26/17 0217  WBC 36.7* 37.8* 38.3*  HGB 7.9* 8.1* 8.5*  HCT 25.3* 25.9* 27.4*  PLT 532* 602* 564*   BMET  Recent Labs  04/24/17 0330  04/25/17 0433 04/26/17 0217  NA 141 138 138  K 4.3 4.4 5.3*  CL 116* 113* 110  CO2 18* 18* 13*  GLUCOSE 121* 137* 91  BUN 35* 38* 48*  CREATININE 1.35* 1.53* 1.93*  CALCIUM 6.0* 6.2* 6.7*   LFT No results for input(s): PROT, ALBUMIN, AST, ALT, ALKPHOS, BILITOT, BILIDIR, IBILI in the last 72 hours. Dg Chest Port 1 View  Result Date: 04/26/2017 CLINICAL DATA:  Endotracheal tube repositioning. EXAM: PORTABLE CHEST 1 VIEW COMPARISON:  Radiograph of same day. FINDINGS: Stable cardiomediastinal silhouette. Nasogastric tube is seen entering the stomach. Left internal jugular catheter is unchanged with distal tip in expected position of the SVC. Endotracheal tube has been partially withdrawn with distal tip 2 cm above the carina. Stable bilateral perihilar and basilar interstitial densities are noted concerning for edema with mild associated pleural effusions. Bony thorax unremarkable. IMPRESSION: Endotracheal tube is in grossly good position at this time. Stable bilateral lung opacities are noted consistent with edema and possible effusions. Electronically Signed   By: Marijo Conception, M.D.   On: 04/26/2017 10:27   Dg Chest Port 1 View  Result Date: 04/26/2017 CLINICAL DATA:  Hypoxia EXAM: PORTABLE CHEST 1 VIEW COMPARISON:  Study obtained earlier in the day FINDINGS: Endotracheal tube tip is at the carina with the tip directed toward the right main bronchus. Left jugular catheter tip is in the superior cava. Nasogastric tube tip and side port below the diaphragm. No pneumothorax. There are small pleural effusions bilaterally with mild interstitial edema. No consolidation appreciable. Heart is mildly enlarged with pulmonary venous hypertension. No evident adenopathy. No bone lesions. IMPRESSION: Tube and catheter positions as described without pneumothorax. Note that the endotracheal tube tip is at the carina directed toward the right main bronchus. Advise withdrawing endotracheal tube approximately 3  cm. Findings indicative of a degree of congestive heart failure. No appreciable consolidation. Critical Value/emergent results were called by telephone at the time of interpretation on 04/26/2017 at 7:08 am to Josue Hector, RN , who verbally acknowledged these results. Electronically Signed   By: Lowella Grip III M.D.   On: 04/26/2017 07:10   Dg Chest Port 1 View  Result Date: 04/26/2017 CLINICAL DATA:  Initial evaluation for acute respiratory failure. EXAM: PORTABLE CHEST 1 VIEW COMPARISON:  Prior radiograph from 04/25/2017. FINDINGS: Endotracheal and enteric tubes have been removed. Left IJ approach centra venous catheter remains in place with tip overlying the cavoatrial junction. Cardiac and mediastinal silhouettes are stable, and remain within normal limits. Lungs normally inflated. Probable  small bilateral pleural effusions. Left basilar opacity likely reflects atelectasis. Mild perihilar vascular congestion with interstitial prominence, suggesting mild pulmonary interstitial edema. Changes slightly increased from previous. No pneumothorax. Osseous structures unchanged. IMPRESSION: 1. Slightly worsened pulmonary vascular congestion with diffuse interstitial prominence, suggesting mild pulmonary interstitial edema. Probable small bilateral pleural effusions. 2. Interval extubation. Electronically Signed   By: Jeannine Boga M.D.   On: 04/26/2017 01:56   Dg Chest Port 1 View  Result Date: 04/25/2017 CLINICAL DATA:  Respiratory failure EXAM: PORTABLE CHEST 1 VIEW COMPARISON:  04/24/2017 FINDINGS: Support devices are stable. There is hyperinflation of the lungs compatible with COPD. She bilateral perihilar and lower lobe opacities are noted which could reflect edema or pneumonia. Small bilateral effusions. IMPRESSION: Bilateral perihilar and lower lobe opacities could reflect edema or pneumonia. COPD. Electronically Signed   By: Rolm Baptise M.D.   On: 04/25/2017 08:08   Dg Abd Portable  1v  Result Date: 04/26/2017 CLINICAL DATA:  Colitis. EXAM: PORTABLE ABDOMEN - 1 VIEW COMPARISON:  04/21/2017 FINDINGS: Nasogastric terminates at the distal stomach. Extensive overlying support apparatus including EKG leads and wires. No gross free intraperitoneal air. Nonobstructive bowel gas pattern. Distal gas and stool. Vascular calcifications. IMPRESSION: No acute findings. Electronically Signed   By: Abigail Miyamoto M.D.   On: 04/26/2017 09:11    ROS: unobtainable as pt ventilated  PHYSICAL EXAM: Blood pressure (!) 138/49, pulse 77, temperature 97.6 F (36.4 C), temperature source Oral, resp. rate 17, height '4\' 10"'  (1.473 m), weight 53.8 kg (118 lb 9.7 oz), SpO2 100 %. HEENT: Sedated, intubated NECK:Lt IJ subclavian LUNGS:Decreased BS bases with few crackles CARDIAC:Sl tachy, regular ABD:+bs NTND No HSM EXT:1-2+ edema arms and legs NEURO:intubated, sedated  Assessment: 1. ARF sec hypotension +/- ATN in setting of bradycardia and sepsis syndrome.  UO appears to be improving as BP improves 2. Junctional rhythm 3. VDRF 4. Hx COPD 5. Met acidosis 6. Mild hyperkalemia 7. Hypocalcemia, has received IV ca today PLAN: 1. Isotonic bicarb as you are doing 2. Cont to dose lasix prn to maintain UO.  UO now is picking up in response to better BP 3. Getting bicarb up should help with K 4. Daily labs 5. Spoke with family and updated them on renal issues.  I brought up the question of would dialysis be done ? If renal fx were to worsen.  They will talk about it along with pts husband.   Jahmal Dunavant T 04/26/2017, 12:15 PM

## 2017-04-26 NOTE — Progress Notes (Signed)
Patient vomiting oral contrast post-administration. MD notified. Ordered to give Reglan and proceed with additional contrast and CT scan.

## 2017-04-26 NOTE — Progress Notes (Signed)
eLink Physician-Brief Progress Note Patient Name: HELANE BRICENO DOB: April 29, 1946 MRN: [id number]   Date of Service  04/26/2017  HPI/Events of Note  Discussed with cardiology, they have reviewed EKG's and agree with transcutaneous pacing; they will assess for transvenous pacing  eICU Interventions       Intervention Category Major Interventions: Arrhythmia - evaluation and management  Simonne Maffucci 04/26/2017, 5:52 AM

## 2017-04-26 NOTE — Progress Notes (Signed)
eLink Physician-Brief Progress Note Patient Name: TERISA BELARDO DOB: 1946-01-24 MRN: [id number]   Date of Service  04/26/2017  HPI/Events of Note  abg shows compensation for a metabolic acidosis, likely non-gap based on yesterday's labs CXR with volume overload HR 30's, MAP low on levophed  eICU Interventions  Lasix Start bicarb infusion Check bMET now Check lactic acid now Restart dobutamine     Intervention Category Major Interventions: Respiratory failure - evaluation and management  Simonne Maffucci 04/26/2017, 2:14 AM

## 2017-04-26 NOTE — Progress Notes (Signed)
I was called to intubate patient in respiratory distress and worsening shock.   On my exam, she is an elderly frail cachectic female, somnolent on BIPAP, Lungs CTA b/l, Heart bradycardic being transcutaneously paced, Abd soft NTND, LIJ TLC in place, Foley catheter in place. RN reports patient has been having copious watery stools. Currently on Levophed @ 40 and Dobut at 20 in addition to Bicarb gtt @ 75cc/hr. Review of her labs show a worsening lactic acidosis now 6.0 up from 0.8 on 04/24/17. She also has a persistent leukocytosis now 38.3. Recent UA and Blood cultures are negative. CXR shows no infiltrates.   Plan: - pan-culture (sputum, blood, UA) - strongly suspect Cdiff; will send stool sample for testing and empirically start PO Vanc via OG tube - KUB to look for bowel distension (toxic megacolon) - since is now on high dose of 2 vasopressors, she may be adrenally insufficient. Check cortisol and empirically start hydrocortisone.  - check CVP - trend lactate - d/c the Lasix that was scheduled - Troponin 3.45; will trend it. Continue transcutaneous pacing. Dr Lake Bells [facility]) is discussing case with Cards now.  - recheck ABG 1hr post intubation; fentanyl gtt for sedation.   45 minutes nonprocedural critical care time  Vernie Murders, MD Pulmonary & Critical Care

## 2017-04-26 NOTE — Progress Notes (Signed)
eLink Physician-Brief Progress Note Patient Name: Alexandra House DOB: September 09, 1946 MRN: [id number]   Date of Service  04/26/2017  HPI/Events of Note  Worsening shock: requiring higher vasopressor support, lactic acid 6, CVP 12, remains bradycardic, coox 34  Have advised family that her condition is critical and requested they consider her code status as we likely need to put her back on life support  eICU Interventions  Adding dobutamine 12 lead Troponin Awaiting family decision regarding full support. Will likely need cardiology depending on family decision     Intervention Category Major Interventions: Shock - evaluation and management  Simonne Maffucci 04/26/2017, 5:03 AM

## 2017-04-26 NOTE — Progress Notes (Signed)
PCCM Interval Progress Note  Called to assess pt at bedside for increased WOB.  Just started on BiPAP some 30 min ago.  Per RN and daughter at bedside, breathing improved since starting BiPAP. CXR with vascular congestion.  ABG with compensated metabolic acidosis.  Pt was on dobutamine which was stopped 7/12.  Has also been on levophed.  Since dobutamine stopped, UOP has dropped some.  Levophed also titrated up this evening and BP now adequate on 22mcg levophed; however, HR remains in high 30's.  On exam, lungs with bibasilar crackles, heart RRR without M/R/G, Extremities cool.  Lasix ordered as well as bicarb gtt and dobutamine to be restarted.  Discussed with RN and daughter, since pt has shown some improvement with BiPAP, feel that it is reasonable to continue to watch closely as we add lasix, bicarb, dobutamine.  Ideally she'll have good response and we can avoid intubation. Did explain to daughter that this does pose some risk (deffering intubation for now); however, pt appears comfortable on BiPAP currently and we are hoping to avoid re-intubation.  RN to notify us if no changes or worsening despite above measures.   Montey Hora, [place] [facility] Pager: [phone]  or [phone] 04/26/2017, 2:32 AM

## 2017-04-26 NOTE — Progress Notes (Signed)
Critical ABG results given to Dr. Lake Bells in the black box, no new orders received at this time. Patient continues to tolerate BiPAP well at this time.

## 2017-04-26 NOTE — Progress Notes (Signed)
Patient placed on BiPAP for ongoing tachypnea. Patient tolerating well at this time. RT will collect ABG in fifteen minutes.

## 2017-04-26 NOTE — Procedures (Signed)
Endotracheal Intubation Procedure Note Indication for endotracheal intubation: impending respiratory failure Sedation: etomidate and midazolam Paralytic: succinylcholine Equipment: Macintosh 3 laryngoscope blade and 7.22mm cuffed endotracheal tube Cricoid Pressure: no Number of attempts: 1 ETT location confirmed by by auscultation, by CXR and ETCO2 monitor.   Impending respiratory failure, failing BIPAP. Decision made to intubate. Dr Lake Bells [facility]) obtained verbal consent from patient's husband via phone. Patient tolerated procedure well no complications. CXR pending.

## 2017-04-26 NOTE — Progress Notes (Signed)
PULMONARY / CRITICAL CARE MEDICINE   Name: Alexandra House MRN: [id number] DOB: November 14, 1945    ADMISSION DATE:04/20/2017 REFERRING MD:Dr. Aileen Fass   CHIEF COMPLAINT:AMS, Hypotension, Hypothermia, Bradycardia   HISTORY OF PRESENT ILLNESS: [name] who initially presented with bradycardia in the 30's, hypotension (79/39), tachypnea, hypothermic. She was treated with IVF with improvement in BP. The patient was admitted per [facility] and treated for suspected sepsis. She developed worsening hypotension, bradycardia, altered mental status and increased work of breathing on 7/8. PCCM called back for re-evaluation. Extubated 7/12, became anxious overnight and tachypneic requiring bipap and ultimately reintubation. Additionally, bradycardic to 30s started on external pacing. Dobutamine restarted at 20 and levophed to 40 and bicarb drip. Ucx, bcx, sputum cultures and  Currently intubated and on pressors.   SUBJECTIVE: Resp distress following extubation and ABG showed compensation for metabolic acidosis.  Required bipap and then reintubation.  MAP low on levophed with bradycardia. Dobutamine and bicarb drip started. Levophed at 40, dobutamine at 20.  LA to 7.6 from 6.0 VITAL SIGNS: BP (!) 86/40   Pulse (!) 41   Temp (!) 97.2 F (36.2 C) (Axillary)   Resp (!) 31   Ht '4\' 10"'  (1.473 m)   Wt 118 lb 9.7 oz (53.8 kg)   SpO2 99%   BMI 24.79 kg/m   HEMODYNAMICS: CVP:  [12 mmHg-13 mmHg] 12 mmHg  VENTILATOR SETTINGS: Vent Mode: PRVC FiO2 (%):  [40 %-50 %] 50 % Set Rate:  [0 bmp-20 bmp] 20 bmp Vt Set:  [360 mL] 360 mL PEEP:  [5 cmH20] 5 cmH20 Pressure Support:  [5 cmH20-7 cmH20] 7 cmH20 Plateau Pressure:  [16 cmH20] 16 cmH20  INTAKE / OUTPUT: I/O last 3 completed shifts: In: 2039.9 [I.V.:962.5; Other:50; NG/GT:717.3; IV Piggyback:310] Out: 977 [Urine:957; Emesis/NG output:20]  PHYSICAL EXAMINATION: General: [name] intubated sitting up in bed appearing comfortable and in NAD Neuro:  alert, responds to commands HEENT: AT/[place], MMM, PERRL Cardiovascular: RRR, no MRG Lungs: CTABL Abdomen: +BS, soft, NTND Musculoskeletal: No gross deformities Extremities: significant edema in upper extremities  LABS:  BMET  Recent Labs Lab 04/24/17 0330 04/25/17 0433 04/26/17 0217  NA 141 138 138  K 4.3 4.4 5.3*  CL 116* 113* 110  CO2 18* 18* 13*  BUN 35* 38* 48*  CREATININE 1.35* 1.53* 1.93*  GLUCOSE 121* 137* 91    Electrolytes  Recent Labs Lab 04/20/17 1340 04/20/17 2004  04/24/17 0330 04/25/17 0433 04/26/17 0217  CALCIUM  --   --   < > 6.0* 6.2* 6.7*  MG 1.8 1.9  --   --   --   --   PHOS 5.2* 4.7*  --   --   --   --   < > = values in this interval not displayed.  CBC  Recent Labs Lab 04/24/17 0330 04/25/17 0433 04/26/17 0217  WBC 36.7* 37.8* 38.3*  HGB 7.9* 8.1* 8.5*  HCT 25.3* 25.9* 27.4*  PLT 532* 602* 564*    Coag's  Recent Labs Lab 04/20/17 1050 04/23/17 0745  APTT  --  68*  INR 1.10  --     Sepsis Markers  Recent Labs Lab 04/20/17 1340  04/23/17 1046 04/24/17 1017 04/25/17 1157 04/26/17 0230 04/26/17 0657  LATICACIDVEN  --   < >  --  0.8  --  6.0* 7.6*  PROCALCITON 1.07  --  0.72  --  0.68  --   --   < > = values in this interval not displayed.  ABG  Recent Labs Lab 04/21/17 1557 04/26/17 0206 04/26/17 0640  PHART 7.432 7.352 7.231*  PCO2ART 26.2* 18.6* 24.4*  PO2ART 158* 109.0* 97.4    Liver Enzymes  Recent Labs Lab 04/21/17 0138 04/21/17 1357 04/23/17 1046  AST 142* 195* 70*  ALT 54 71* 76*  ALKPHOS 168* 142* 155*  BILITOT 0.7 0.5 0.2*  ALBUMIN 1.7* 1.8* 1.7*    Cardiac Enzymes  Recent Labs Lab 04/24/17 0330 04/25/17 0800 04/26/17 0507  TROPONINI 3.61* 3.45* 2.87*    Glucose  Recent Labs Lab 04/24/17 2328 04/25/17 0805 04/25/17 1148 04/25/17 1536 04/25/17 2002 04/25/17 2349  GLUCAP 150* 109* 114* 109* 105* 101*    Imaging Dg Chest Port 1 View  Result Date:  04/26/2017 CLINICAL DATA:  Hypoxia EXAM: PORTABLE CHEST 1 VIEW COMPARISON:  Study obtained earlier in the day FINDINGS: Endotracheal tube tip is at the carina with the tip directed toward the right main bronchus. Left jugular catheter tip is in the superior cava. Nasogastric tube tip and side port below the diaphragm. No pneumothorax. There are small pleural effusions bilaterally with mild interstitial edema. No consolidation appreciable. Heart is mildly enlarged with pulmonary venous hypertension. No evident adenopathy. No bone lesions. IMPRESSION: Tube and catheter positions as described without pneumothorax. Note that the endotracheal tube tip is at the carina directed toward the right main bronchus. Advise withdrawing endotracheal tube approximately 3 cm. Findings indicative of a degree of congestive heart failure. No appreciable consolidation. Critical Value/emergent results were called by telephone at the time of interpretation on 04/26/2017 at 7:08 am to Josue Hector, RN , who verbally acknowledged these results. Electronically Signed   By: Lowella Grip III M.D.   On: 04/26/2017 07:10   Dg Chest Port 1 View  Result Date: 04/26/2017 CLINICAL DATA:  Initial evaluation for acute respiratory failure. EXAM: PORTABLE CHEST 1 VIEW COMPARISON:  Prior radiograph from 04/25/2017. FINDINGS: Endotracheal and enteric tubes have been removed. Left IJ approach centra venous catheter remains in place with tip overlying the cavoatrial junction. Cardiac and mediastinal silhouettes are stable, and remain within normal limits. Lungs normally inflated. Probable small bilateral pleural effusions. Left basilar opacity likely reflects atelectasis. Mild perihilar vascular congestion with interstitial prominence, suggesting mild pulmonary interstitial edema. Changes slightly increased from previous. No pneumothorax. Osseous structures unchanged. IMPRESSION: 1. Slightly worsened pulmonary vascular congestion with diffuse  interstitial prominence, suggesting mild pulmonary interstitial edema. Probable small bilateral pleural effusions. 2. Interval extubation. Electronically Signed   By: Jeannine Boga M.D.   On: 04/26/2017 01:56     STUDIES: 7/05 [place] ABD >>abdominal US on 7/5 which was negative for gallstones, hepatic abnormality, normal pancreas &no hydronephrosis.  7/7 CT chest/abdomen/pelvis> emphysema, mult-lobar pneumonia, fatty liver, fat stranding around head of pancreas 7/11 CXR: Emphysematous changes with bibasilar atelectasis and probable mild pulmonary edema. 7/13 CXR: Endotracheal tube tip is at the carina directed toward the right main bronchus. Advise withdrawing endotracheal tube approximately 3 cm  CULTURES: BCx2 7/7>> final negative UC 7/7 >>final negative BCX 7/10>>  NGTD x2 days resp culture 7/10 >> few candida albicans BCX 7/13>> Ucx 7/13>> Sputum cx>>   ANTIBIOTICS: Zosyn 7/7 >> Vanco 7/7 >>7/10 Vancomycin PO 7/13>>  SIGNIFICANT EVENTS: 7/07 Admit with N/V, weakness, hypotension  7/8 >> intubated 7/12>> extubated 7/13>> intubated   LINES/TUBES: ETT 7/8 >>7/12 ETT 7/13>> CVC LIJ 7/8 >> Urethral cath 7/8>> NGT 7/8>>  DISCUSSION: 71 y/o female septic shock, aspiration and elevated troponin likely 2/2 demand ischemia vs: NSTEMI. Resp  distress 7/12 s/p extubation, now reintubated. Continued significant leukocytosis with watery stools. Repeating blood, urine, sputum cultures and empirically treating for c. Diff.   ASSESSMENT / PLAN:  PULMONARY A: Acute Respiratory Failure Centrilobular Emphysema  P: Full vent support VAP prevention Daily WUA/SBT Start brovana/pulmicort, mucinex Start duoneb See ID  CARDIOVASCULAR A:  Bradycardia, with junctional escape rhythm and transcutaneous pacing overnight  Elevated Troponin - suspect demand ischemia, 3.45>2.87 Hx Grade II Diastolic Dysfunction, LVEF Hx Carotid Stenosis  Shock: presumably septic  vs cardiogenic P: lovenox Levophed titrated to MAP > 65 Continue dobutamine  Cardiology following; recommend considering transvenous pacing if arrythmia continues Recheck troponin and echo  RENAL A:  AKI Non-gap metabolic acidosis Hypokalemia, resolved Hypocalcemia: 6.7 P: Monitor BMET and UOP Replace electrolytes as needed Calcium gluconate 1g  GASTROINTESTINAL A:  Nausea / Vomiting, Weight Loss - ? If related to Prolia  Elevated alk phos without evidence of biliary obstruction Watery stools with significant leukocytosis to 38 and LA to 7.6 from 6.0 P: NPO / OGT protonix for ppx Cont tube feeds Abd xray pending See ID GI signed off  HEMATOLOGIC A:  Anemia, stable. Hgb 8,1 P: Monitor for bleeding Transfusion threshold <8 lovenox  INFECTIOUS A:  Aspiration pneumonia ? c.diff P: C. Diff pending Continue zosyn Repeat, urine, blood and sputum cultures pending Started PO vanc  ENDOCRINE A:  Hyperglycemia  P: SSI  NEUROLOGIC A:  Mild confusion/acute encephalopathy due to sepsis/shock P: RASS goal: -1 Fentanyl gtt Versed prn SBT daily ativan 11m BID PRN    FAMILY - Updates: updated husband and son at bedside at length  - Inter-disciplinary family meet or Palliative Care meeting due by: day 7   Pulmonary and CSheridanPager: [phone] 04/26/2017, 8:02 AM

## 2017-04-26 NOTE — Progress Notes (Signed)
CRITICAL VALUE ALERT  Critical Value:  Lactic acid 6.0  Date & Time Notied:  04/26/2017 04:33  Provider Notified: Despina Hick.  Orders Received/Actions taken: Cooxemetry panel ordered; MD spoke to family via telephone.

## 2017-04-26 NOTE — Procedures (Signed)
Arterial Catheter Insertion Procedure Note Alexandra House [id number] 11/08/1945  Procedure: Insertion of Arterial Catheter  Indications: Blood pressure monitoring and Frequent blood sampling  Procedure Details Consent: Risks of procedure as well as the alternatives and risks of each were explained to the (patient/caregiver).  Consent for procedure obtained. Time Out: Verified patient identification, verified procedure, site/side was marked, verified correct patient position, special equipment/implants available, medications/allergies/relevent history reviewed, required imaging and test results available.  Performed  Maximum sterile technique was used including antiseptics, cap, gloves, gown, hand hygiene, mask and sheet. Skin prep: Chlorhexidine; local anesthetic administered 20 gauge catheter was inserted into right radial artery using the Seldinger technique.  Evaluation Blood flow good; BP tracing good. Complications: No apparent complications.   Soyla Dryer 04/26/2017

## 2017-04-26 NOTE — Progress Notes (Addendum)
Progress Note  Patient Name: Alexandra House Date of Encounter: 04/26/2017  Primary Cardiologist: Stanford Breed  Subjective   Reintubated last night. Had marked bradycardia and junctional rhythm overnight and received transcutanous pacing. Dobutamine restarted. This morning, the transcutaneous pacer is clearly not capturing and she has an underlying narrow complex rhythm at 60 bpm - sometimes SR, competing with an acelerated junctional rhythm.  Inpatient Medications    Scheduled Meds: . arformoterol  15 mcg Nebulization BID  . aspirin  325 mg Per Tube Daily  . budesonide (PULMICORT) nebulizer solution  0.5 mg Nebulization BID  . chlorhexidine gluconate (MEDLINE KIT)  15 mL Mouth Rinse BID  . Chlorhexidine Gluconate Cloth  6 each Topical Daily  . fentaNYL (SUBLIMAZE) injection  50 mcg Intravenous Once  . fentaNYL (SUBLIMAZE) injection  50 mcg Intravenous Once  . guaiFENesin  600 mg Oral BID  . heparin subcutaneous  5,000 Units Subcutaneous Q8H  . hydrocortisone sod succinate (SOLU-CORTEF) inj  50 mg Intravenous Q6H  . mouth rinse  15 mL Mouth Rinse QID  . pantoprazole (PROTONIX) IV  40 mg Intravenous Q12H  . sodium bicarbonate  50 mEq Intravenous Once  . sodium chloride flush  10-40 mL Intracatheter Q12H  . sodium chloride flush  3 mL Intravenous Q12H  . vancomycin  500 mg Per Tube Q6H   Continuous Infusions: . sodium chloride 10 mL/hr at 04/26/17 0400  . DOBUTamine 20 mcg/kg/min (04/26/17 0447)  . feeding supplement (VITAL AF 1.2 CAL) Stopped (04/25/17 1111)  . fentaNYL infusion INTRAVENOUS    . norepinephrine (LEVOPHED) Adult infusion 40 mcg/min (04/26/17 0535)  . piperacillin-tazobactam (ZOSYN)  IV 3.375 g (04/26/17 0531)  .  sodium bicarbonate  infusion 1000 mL 75 mL/hr at 04/26/17 0229   PRN Meds: acetaminophen **OR** acetaminophen, fentaNYL, ipratropium-albuterol, midazolam, midazolam, ondansetron **OR** ondansetron (ZOFRAN) IV, sodium chloride flush   Vital Signs      Vitals:   04/26/17 0400 04/26/17 0415 04/26/17 0430 04/26/17 0445  BP: (!) 101/47 (!) 94/50 (!) 95/58 (!) 86/40  Pulse: (!) 40 (!) 39 (!) 41 (!) 41  Resp: (!) 34 (!) 30 (!) 34 (!) 31  Temp:      TempSrc:      SpO2: 100% 98% 99% 99%  Weight:      Height:        Intake/Output Summary (Last 24 hours) at 04/26/17 0738 Last data filed at 04/26/17 0400  Gross per 24 hour  Intake           721.22 ml  Output              810 ml  Net           -88.78 ml   Filed Weights   04/24/17 0336 04/25/17 0315 04/26/17 0300  Weight: 119 lb 4.3 oz (54.1 kg) 120 lb 9.5 oz (54.7 kg) 118 lb 9.7 oz (53.8 kg)    Telemetry    Sinus brady/accelerated junctional. I am not sure the transcutaneous pacer ever worked. - Personally Reviewed  ECG    Junctional escape rhythm 45 bpm at 0500h - Personally Reviewed  Physical Exam   GEN: Intubated, moves extremities, does not follow commands Neck: No JVD Cardiac: RRR, no murmurs, rubs, or gallops.  Respiratory: Rhonchi bilaterally. GI: Soft, nontender, non-distended  MS: No edema; No deformity. Neuro:  Nonfocal  Psych: Normal affect   Labs    Chemistry Recent Labs Lab 04/21/17 0138 04/21/17 1357  04/23/17 1046 04/24/17 0330 04/25/17  9937 04/26/17 0217  NA 136 144  < >  --  141 138 138  K 4.1 3.1*  < >  --  4.3 4.4 5.3*  CL 108 108  < >  --  116* 113* 110  CO2 18* 18*  < >  --  18* 18* 13*  GLUCOSE 146* 163*  < >  --  121* 137* 91  BUN 31* 32*  < >  --  35* 38* 48*  CREATININE 2.64* 2.55*  < >  --  1.35* 1.53* 1.93*  CALCIUM 5.7* 5.3*  < >  --  6.0* 6.2* 6.7*  PROT 5.1* 4.6*  --  4.8*  --   --   --   ALBUMIN 1.7* 1.8*  --  1.7*  --   --   --   AST 142* 195*  --  70*  --   --   --   ALT 54 71*  --  76*  --   --   --   ALKPHOS 168* 142*  --  155*  --   --   --   BILITOT 0.7 0.5  --  0.2*  --   --   --   GFRNONAA 17* 18*  < >  --  38* 33* 25*  GFRAA 20* 20*  < >  --  44* 38* 29*  ANIONGAP 10 18*  < >  --  '7 7 15  ' < > = values in this  interval not displayed.   Hematology Recent Labs Lab 04/24/17 0330 04/25/17 0433 04/26/17 0217  WBC 36.7* 37.8* 38.3*  RBC 2.90* 2.93* 3.10*  HGB 7.9* 8.1* 8.5*  HCT 25.3* 25.9* 27.4*  MCV 87.2 88.4 88.4  MCH 27.2 27.6 27.4  MCHC 31.2 31.3 31.0  RDW 14.9 15.4 16.8*  PLT 532* 602* 564*    Cardiac Enzymes Recent Labs Lab 04/23/17 2205 04/24/17 0330 04/25/17 0800 04/26/17 0507  TROPONINI 4.04* 3.61* 3.45* 2.87*    Recent Labs Lab 04/20/17 1116  TROPIPOC 2.05*     BNP Recent Labs Lab 04/21/17 1357  BNP 1,528.1*     DDimer No results for input(s): DDIMER in the last 168 hours.   Radiology    Dg Chest Port 1 View  Result Date: 04/26/2017 CLINICAL DATA:  Hypoxia EXAM: PORTABLE CHEST 1 VIEW COMPARISON:  Study obtained earlier in the day FINDINGS: Endotracheal tube tip is at the carina with the tip directed toward the right main bronchus. Left jugular catheter tip is in the superior cava. Nasogastric tube tip and side port below the diaphragm. No pneumothorax. There are small pleural effusions bilaterally with mild interstitial edema. No consolidation appreciable. Heart is mildly enlarged with pulmonary venous hypertension. No evident adenopathy. No bone lesions. IMPRESSION: Tube and catheter positions as described without pneumothorax. Note that the endotracheal tube tip is at the carina directed toward the right main bronchus. Advise withdrawing endotracheal tube approximately 3 cm. Findings indicative of a degree of congestive heart failure. No appreciable consolidation. Critical Value/emergent results were called by telephone at the time of interpretation on 04/26/2017 at 7:08 am to Josue Hector, RN , who verbally acknowledged these results. Electronically Signed   By: Lowella Grip III M.D.   On: 04/26/2017 07:10   Dg Chest Port 1 View  Result Date: 04/26/2017 CLINICAL DATA:  Initial evaluation for acute respiratory failure. EXAM: PORTABLE CHEST 1 VIEW COMPARISON:   Prior radiograph from 04/25/2017. FINDINGS: Endotracheal and enteric tubes have been removed. Left  IJ approach centra venous catheter remains in place with tip overlying the cavoatrial junction. Cardiac and mediastinal silhouettes are stable, and remain within normal limits. Lungs normally inflated. Probable small bilateral pleural effusions. Left basilar opacity likely reflects atelectasis. Mild perihilar vascular congestion with interstitial prominence, suggesting mild pulmonary interstitial edema. Changes slightly increased from previous. No pneumothorax. Osseous structures unchanged. IMPRESSION: 1. Slightly worsened pulmonary vascular congestion with diffuse interstitial prominence, suggesting mild pulmonary interstitial edema. Probable small bilateral pleural effusions. 2. Interval extubation. Electronically Signed   By: Jeannine Boga M.D.   On: 04/26/2017 01:56   Dg Chest Port 1 View  Result Date: 04/25/2017 CLINICAL DATA:  Respiratory failure EXAM: PORTABLE CHEST 1 VIEW COMPARISON:  04/24/2017 FINDINGS: Support devices are stable. There is hyperinflation of the lungs compatible with COPD. She bilateral perihilar and lower lobe opacities are noted which could reflect edema or pneumonia. Small bilateral effusions. IMPRESSION: Bilateral perihilar and lower lobe opacities could reflect edema or pneumonia. COPD. Electronically Signed   By: Rolm Baptise M.D.   On: 04/25/2017 08:08   Dg Chest Port 1 View  Result Date: 04/24/2017 CLINICAL DATA:  Intubation EXAM: PORTABLE CHEST 1 VIEW COMPARISON:  Portable exam 0857 hours compared to 04/23/2017 FINDINGS: Tip of endotracheal tube projects 3.4 cm above carina. Nasogastric tube extends into stomach. LEFT jugular central venous catheter tip projects over cavoatrial junction. Normal heart size, mediastinal contours, and pulmonary vascularity. Atherosclerotic calcification at aortic arch. Emphysematous and bronchitic changes with biapical scarring greater on  RIGHT. Scattered interstitial infiltrates likely pulmonary edema. Minimal bibasilar atelectasis. No pneumothorax. Bones demineralized. IMPRESSION: Line and tube positions as above Emphysematous changes with bibasilar atelectasis and probable mild pulmonary edema. Aortic Atherosclerosis (ICD10-I70.0) and Emphysema (ICD10-J43.9). Electronically Signed   By: Lavonia Dana M.D.   On: 04/24/2017 09:11    Cardiac Studies   04/21/2017 ECHO - Left ventricle: The cavity size was normal. Wall thickness was   normal. Systolic function was mildly reduced. The estimated   ejection fraction was 45%. - Regional wall motion abnormality: Hypokinesis of the basal-mid   anteroseptal, basal-mid inferoseptal, basal-mid inferior, and   apical septal myocardium. - Mitral valve: There was moderate regurgitation. - Right ventricle: Systolic function was mildly reduced. - Tricuspid valve: There was mild regurgitation.   Patient Profile     71 y.o. female with septic shock of unclear source and acute CHF with mildly depressed LVEF (45%), minor troponin increase presumed secondary to demand ischemia versus takotsubo sd. Extubated 7/12, reintubated early 7/13. Had bradycardia and Mobitz I 2nd deg AV block at initial presentation, again bradycardic with junctional escape rhythm 7/13 around time of reintubation.  Assessment & Plan    1. Sinus bradycardia and 2nd deg AV block: transcutaneous pacing was not working, but rhythm has improved with dobutamine. Cause is uncertain, suspect age-related conduction system disease and respiratory failure as the cause, but she may have RCA ischemia. Normal Myoview 2013. May need transvenous pacing if arrhythmia recurs. She remains too ill for coronary angiography and renal function is deteriorating. Avoid any negative chronotropes. Leave on dobutamine. 2. Elevated troponin: recheck.troponin and recheck echo for wall motion. If EF is worse may have to proceed with cath, but the risk of  contrast nephrotoxicity and renal failure would be high. 3. ARF: initial improvement in renal function, now steadily worsening again. 4. Septic shock: the underlying cause of her acute illness remains elusive   She is critically ill with multiple organ failure and requires complex decision-making. Prognosis  is very guarded.  Time 60 minutes.  Signed, Sanda Klein, MD  04/26/2017, 7:38 AM

## 2017-04-26 NOTE — Progress Notes (Signed)
Arterial line not correlating with BP cuff, and unable to pull back blood. Attempted to redress and save arterial line, but still was not functional. Arterial line removed by RN. No need to place another line at this time per Dr. Halford Chessman. RT will continue to monitor.

## 2017-04-26 NOTE — Progress Notes (Signed)
[facility] Progress Note Patient Name: Alexandra House DOB: 07-24-1946 MRN: [id number]   Date of Service  04/26/2017  HPI/Events of Note  resp distres Extubated yesterday Now with increased work of breathing, appears edematous on exam  eICU Interventions  Stat CXR ABG Bedside to eval BIPAP for now     Intervention Category Intermediate Interventions: Respiratory distress - evaluation and management  Simonne Maffucci 04/26/2017, 1:36 AM

## 2017-04-27 ENCOUNTER — Inpatient Hospital Stay (HOSPITAL_COMMUNITY): Payer: Medicare HMO

## 2017-04-27 DIAGNOSIS — I9589 Other hypotension: Secondary | ICD-10-CM

## 2017-04-27 DIAGNOSIS — Z01818 Encounter for other preprocedural examination: Secondary | ICD-10-CM

## 2017-04-27 DIAGNOSIS — I360 Nonrheumatic tricuspid (valve) stenosis: Secondary | ICD-10-CM

## 2017-04-27 LAB — GLUCOSE, CAPILLARY
GLUCOSE-CAPILLARY: 137 mg/dL — AB (ref 65–99)
GLUCOSE-CAPILLARY: 109 mg/dL — AB (ref 65–99)
GLUCOSE-CAPILLARY: 118 mg/dL — AB (ref 65–99)
Glucose-Capillary: 106 mg/dL — ABNORMAL HIGH (ref 65–99)
Glucose-Capillary: 161 mg/dL — ABNORMAL HIGH (ref 65–99)
Glucose-Capillary: 223 mg/dL — ABNORMAL HIGH (ref 65–99)
Glucose-Capillary: 99 mg/dL (ref 65–99)

## 2017-04-27 LAB — COMPREHENSIVE METABOLIC PANEL
ALBUMIN: 1.8 g/dL — AB (ref 3.5–5.0)
ALK PHOS: 256 U/L — AB (ref 38–126)
ALT: 261 U/L — AB (ref 14–54)
ANION GAP: 10 (ref 5–15)
AST: 510 U/L — ABNORMAL HIGH (ref 15–41)
BUN: 40 mg/dL — ABNORMAL HIGH (ref 6–20)
CALCIUM: 6.3 mg/dL — AB (ref 8.9–10.3)
CO2: 30 mmol/L (ref 22–32)
CREATININE: 1.73 mg/dL — AB (ref 0.44–1.00)
Chloride: 97 mmol/L — ABNORMAL LOW (ref 101–111)
GFR calc Af Amer: 33 mL/min — ABNORMAL LOW (ref >60)
GFR calc non Af Amer: 28 mL/min — ABNORMAL LOW (ref >60)
GLUCOSE: 138 mg/dL — AB (ref 65–99)
Potassium: 3.3 mmol/L — ABNORMAL LOW (ref 3.5–5.1)
SODIUM: 137 mmol/L (ref 135–145)
Total Bilirubin: 0.7 mg/dL (ref 0.3–1.2)
Total Protein: 5.2 g/dL — ABNORMAL LOW (ref 6.5–8.1)

## 2017-04-27 LAB — CBC
HCT: 27.4 % — ABNORMAL LOW (ref 36.0–46.0)
HEMOGLOBIN: 8.8 g/dL — AB (ref 12.0–15.0)
MCH: 27.8 pg (ref 26.0–34.0)
MCHC: 32.1 g/dL (ref 30.0–36.0)
MCV: 86.7 fL (ref 78.0–100.0)
Platelets: 556 10*3/uL — ABNORMAL HIGH (ref 150–400)
RBC: 3.16 MIL/uL — AB (ref 3.87–5.11)
RDW: 17.4 % — ABNORMAL HIGH (ref 11.5–15.5)
WBC: 37 10*3/uL — AB (ref 4.0–10.5)

## 2017-04-27 LAB — C DIFFICILE QUICK SCREEN W PCR REFLEX
C Diff antigen: POSITIVE — AB
C Diff toxin: NEGATIVE

## 2017-04-27 MED ORDER — FUROSEMIDE 10 MG/ML IJ SOLN
80.0000 mg | Freq: Every day | INTRAMUSCULAR | Status: DC
  Administered 2017-04-29 – 2017-05-01 (×3): 80 mg via INTRAVENOUS
  Filled 2017-04-27 (×4): qty 8

## 2017-04-27 MED ORDER — SODIUM CHLORIDE 0.9 % IV SOLN
1.0000 g | Freq: Once | INTRAVENOUS | Status: AC
  Administered 2017-04-27: 1 g via INTRAVENOUS
  Filled 2017-04-27: qty 10

## 2017-04-27 MED ORDER — PERFLUTREN LIPID MICROSPHERE
1.0000 mL | INTRAVENOUS | Status: AC | PRN
  Administered 2017-04-27: 2 mL via INTRAVENOUS
  Filled 2017-04-27: qty 10

## 2017-04-27 MED ORDER — POTASSIUM CHLORIDE 10 MEQ/50ML IV SOLN
10.0000 meq | INTRAVENOUS | Status: AC
Start: 2017-04-27 — End: 2017-04-27
  Administered 2017-04-27 (×3): 10 meq via INTRAVENOUS
  Filled 2017-04-27 (×3): qty 50

## 2017-04-27 MED ORDER — ACETAMINOPHEN 160 MG/5ML PO SOLN
650.0000 mg | Freq: Four times a day (QID) | ORAL | Status: DC | PRN
  Administered 2017-04-27: 650 mg
  Filled 2017-04-27 (×2): qty 20.3

## 2017-04-27 MED ORDER — INSULIN ASPART 100 UNIT/ML ~~LOC~~ SOLN
1.0000 [IU] | SUBCUTANEOUS | Status: DC
  Administered 2017-04-27: 3 [IU] via SUBCUTANEOUS
  Administered 2017-04-27: 1 [IU] via SUBCUTANEOUS
  Administered 2017-04-27 – 2017-04-28 (×2): 2 [IU] via SUBCUTANEOUS
  Administered 2017-04-30: 1 [IU] via SUBCUTANEOUS

## 2017-04-27 NOTE — Progress Notes (Signed)
  Echocardiogram 2D Echocardiogram has been performed.  Alexandra House 04/27/2017, 5:21 PM

## 2017-04-27 NOTE — Progress Notes (Signed)
PULMONARY / CRITICAL CARE MEDICINE   Name: Alexandra House MRN: [id number] DOB: Feb 14, 1946    ADMISSION DATE:04/20/2017 REFERRING MD:Dr. Aileen Fass   CHIEF COMPLAINT:AMS, Hypotension, Hypothermia, Bradycardia   HISTORY OF PRESENT ILLNESS: [name] who initially presented with bradycardia in the 30's, hypotension (79/39), tachypnea, hypothermic. She was treated with IVF with improvement in BP. The patient was admitted per [facility] and treated for suspected sepsis. She developed worsening hypotension, bradycardia, altered mental status and increased work of breathing on 7/8. PCCM called back for re-evaluation. Extubated 7/12, became anxious overnight and tachypneic requiring bipap and ultimately reintubation. Additionally, bradycardic to 30s started on external pacing. Dobutamine restarted at 20 and levophed to 40 and bicarb drip. Ucx, bcx, sputum cultures and  Currently intubated and on pressors.   SUBJECTIVE: Off dobutamine, levo is weaning down.  VITAL SIGNS: BP (!) 118/57   Pulse 77   Temp (!) 97.5 F (36.4 C) (Oral)   Resp 14   Ht '4\' 10"'  (1.473 m)   Wt 118 lb 6.2 oz (53.7 kg)   SpO2 100%   BMI 24.74 kg/m   HEMODYNAMICS:    VENTILATOR SETTINGS: Vent Mode: PSV;CPAP FiO2 (%):  [40 %] 40 % Set Rate:  [16 bmp] 16 bmp Vt Set:  [360 mL] 360 mL PEEP:  [5 cmH20] 5 cmH20 Pressure Support:  [5 cmH20] 5 cmH20 Plateau Pressure:  [13 cmH20-14 cmH20] 14 cmH20  INTAKE / OUTPUT: I/O last 3 completed shifts: In: 4627.1 [I.V.:3412.1; NG/GT:120; IV Piggyback:1095] Out: 3614 [ERXVQ:0086; Stool:1]  PHYSICAL EXAMINATION: Gen:      No acute distress HEENT:  EOMI, sclera anicteric, ETT in place Neck:     No masses; no thyromegaly Lungs:    Clear to auscultation bilaterally; normal respiratory effort CV:         Regular rate and rhythm; no murmurs Abd:      + bowel sounds; soft, non-tender; no palpable masses, no distension Ext:    No edema; adequate peripheral perfusion Skin:       Warm and dry; no rash Neuro: alert and oriented x 3 Psych: normal mood and affect  LABS:  BMET  Recent Labs Lab 04/25/17 0433 04/26/17 0217 04/27/17 0313  NA 138 138 137  K 4.4 5.3* 3.3*  CL 113* 110 97*  CO2 18* 13* 30  BUN 38* 48* 40*  CREATININE 1.53* 1.93* 1.73*  GLUCOSE 137* 91 138*    Electrolytes  Recent Labs Lab 04/20/17 1340 04/20/17 2004  04/25/17 0433 04/26/17 0217 04/26/17 1330 04/27/17 0313  CALCIUM  --   --   < > 6.2* 6.7*  --  6.3*  MG 1.8 1.9  --   --   --   --   --   PHOS 5.2* 4.7*  --   --   --  2.9  --   < > = values in this interval not displayed.  CBC  Recent Labs Lab 04/25/17 0433 04/26/17 0217 04/27/17 0313  WBC 37.8* 38.3* 37.0*  HGB 8.1* 8.5* 8.8*  HCT 25.9* 27.4* 27.4*  PLT 602* 564* 556*    Coag's  Recent Labs Lab 04/20/17 1050 04/23/17 0745  APTT  --  68*  INR 1.10  --     Sepsis Markers  Recent Labs Lab 04/20/17 1340  04/23/17 1046  04/25/17 1157 04/26/17 0230 04/26/17 0657 04/26/17 1539  LATICACIDVEN  --   < >  --   < >  --  6.0* 7.6* 2.5*  PROCALCITON 1.07  --  0.72  --  0.68  --   --   --   < > = values in this interval not displayed.  ABG  Recent Labs Lab 04/26/17 0206 04/26/17 0640 04/26/17 0820  PHART 7.352 7.231* 7.333*  PCO2ART 18.6* 24.4* 25.1*  PO2ART 109.0* 97.4 126*    Liver Enzymes  Recent Labs Lab 04/21/17 1357 04/23/17 1046 04/27/17 0313  AST 195* 70* 510*  ALT 71* 76* 261*  ALKPHOS 142* 155* 256*  BILITOT 0.5 0.2* 0.7  ALBUMIN 1.8* 1.7* 1.8*    Cardiac Enzymes  Recent Labs Lab 04/25/17 0800 04/26/17 0507 04/26/17 0842  TROPONINI 3.45* 2.87* 2.28*    Glucose  Recent Labs Lab 04/26/17 1133 04/26/17 1554 04/26/17 2005 04/27/17 0041 04/27/17 0346 04/27/17 0856  GLUCAP 184* 177* 158* 223* 137* 99    Imaging Ct Abdomen Pelvis Wo Contrast  Result Date: 04/26/2017 CLINICAL DATA:  Septic shock. EXAM: CT CHEST, ABDOMEN AND PELVIS WITHOUT CONTRAST TECHNIQUE:  Multidetector CT imaging of the chest, abdomen and pelvis was performed following the standard protocol without IV contrast. COMPARISON:  CT abdomen 04/20/2017 FINDINGS: CT CHEST FINDINGS Cardiovascular: Coronary artery calcification and aortic atherosclerotic calcification. Mediastinum/Nodes: No axillary or supraclavicular adenopathy. Small paratracheal and prevascular lymph nodes. No pericardial fluid. NG tube extends into the stomach. Endotracheal to extends into the trachea several cm from carina. Lungs/Pleura: Bilateral moderate size pleural effusions. There is bibasilar patchy airspace disease. No large consolidation. Mild interlobular septal thickening Musculoskeletal: No acute osseous abnormality. CT ABDOMEN AND PELVIS FINDINGS Hepatobiliary: Nonionic contrast imaging demonstrates no focal hepatic lesion. Gallbladder normal. Pancreas: Pancreas is normal. No ductal dilatation. No pancreatic inflammation. Spleen: Normal spleen Adrenals/urinary tract: Adrenal glands are normal. No hydronephrosis. Foley catheter within the bladder. Stomach/Bowel: Feeding tube extends the gastric antrum. No bowel obstruction. No bowel inflammation evident. Appendix is normal. Colon and rectosigmoid colon are normal. Vascular/Lymphatic: Abdominal aorta is normal caliber with atherosclerotic calcification. There is no retroperitoneal or periportal lymphadenopathy. No pelvic lymphadenopathy. Reproductive: Uterus and adnexa normal. Other: Small amount free fluid the pelvis. Significant anasarca of the soft tissues the abdomen pelvis. No evidence abscess in the abdomen pelvis. Musculoskeletal: No aggressive osseous lesion. IMPRESSION: Chest Impression: 1. Bilateral moderate-sized pleural effusions. 2. Patchy airspace disease in lower lobes representing pneumonia or aspiration pneumonitis. No large consolidation. 3. Mild interstitial edema. 4. Endotracheal tube appears in good position. Abdomen / Pelvis Impression: 1. Anasarca of the  soft tissues. 2. Small amount intraperitoneal fluid. 3. No evidence pelvic abscess.  No bowel obstruction. 4. Feeding tube with tip gastric antrum. 5. No hydronephrosis.  Foley catheter in bladder. Electronically Signed   By: Suzy Bouchard M.D.   On: 04/26/2017 17:07   Ct Chest Wo Contrast  Result Date: 04/26/2017 CLINICAL DATA:  Septic shock. EXAM: CT CHEST, ABDOMEN AND PELVIS WITHOUT CONTRAST TECHNIQUE: Multidetector CT imaging of the chest, abdomen and pelvis was performed following the standard protocol without IV contrast. COMPARISON:  CT abdomen 04/20/2017 FINDINGS: CT CHEST FINDINGS Cardiovascular: Coronary artery calcification and aortic atherosclerotic calcification. Mediastinum/Nodes: No axillary or supraclavicular adenopathy. Small paratracheal and prevascular lymph nodes. No pericardial fluid. NG tube extends into the stomach. Endotracheal to extends into the trachea several cm from carina. Lungs/Pleura: Bilateral moderate size pleural effusions. There is bibasilar patchy airspace disease. No large consolidation. Mild interlobular septal thickening Musculoskeletal: No acute osseous abnormality. CT ABDOMEN AND PELVIS FINDINGS Hepatobiliary: Nonionic contrast imaging demonstrates no focal hepatic lesion. Gallbladder normal. Pancreas: Pancreas is normal. No ductal dilatation. No  pancreatic inflammation. Spleen: Normal spleen Adrenals/urinary tract: Adrenal glands are normal. No hydronephrosis. Foley catheter within the bladder. Stomach/Bowel: Feeding tube extends the gastric antrum. No bowel obstruction. No bowel inflammation evident. Appendix is normal. Colon and rectosigmoid colon are normal. Vascular/Lymphatic: Abdominal aorta is normal caliber with atherosclerotic calcification. There is no retroperitoneal or periportal lymphadenopathy. No pelvic lymphadenopathy. Reproductive: Uterus and adnexa normal. Other: Small amount free fluid the pelvis. Significant anasarca of the soft tissues the abdomen  pelvis. No evidence abscess in the abdomen pelvis. Musculoskeletal: No aggressive osseous lesion. IMPRESSION: Chest Impression: 1. Bilateral moderate-sized pleural effusions. 2. Patchy airspace disease in lower lobes representing pneumonia or aspiration pneumonitis. No large consolidation. 3. Mild interstitial edema. 4. Endotracheal tube appears in good position. Abdomen / Pelvis Impression: 1. Anasarca of the soft tissues. 2. Small amount intraperitoneal fluid. 3. No evidence pelvic abscess.  No bowel obstruction. 4. Feeding tube with tip gastric antrum. 5. No hydronephrosis.  Foley catheter in bladder. Electronically Signed   By: Suzy Bouchard M.D.   On: 04/26/2017 17:07   Portable Chest Xray  Result Date: 04/27/2017 CLINICAL DATA:  Smoker. Respiratory failure; on vent. EXAM: PORTABLE CHEST 1 VIEW COMPARISON:  04/26/2017 FINDINGS: Irregular interstitial thickening noted on the prior study has improved. There is less opacity at the lung bases. There still mild bilateral interstitial thickening associated with prominent bronchovascular markings. Lungs are hyperexpanded. Residual hazy lung base opacity is most noted on the left consistent with a small pleural effusion. No pneumothorax. The endotracheal tube, nasogastric tube and left internal jugular central venous line are stable and well positioned. IMPRESSION: 1. Improved lung aeration when compared to the previous day's study. This is most consistent with improved interstitial pulmonary edema and decreased pleural effusions. No new abnormalities. 2. Support apparatus is stable and well positioned. Electronically Signed   By: Lajean Manes M.D.   On: 04/27/2017 07:36     STUDIES: 7/05 [place] ABD >>abdominal US on 7/5 which was negative for gallstones, hepatic abnormality, normal pancreas &no hydronephrosis.  7/7 CT chest/abdomen/pelvis> emphysema, mult-lobar pneumonia, fatty liver, fat stranding around head of pancreas 7/13 CT chest/abdomen/pelvis>  Moderate B/L effusions, patchy air space disease at bases. anasarca  CULTURES: BCx2 7/7>> final negative UC 7/7 >>final negative BCX 7/10>>  NGTD x2 days resp culture 7/10 >> few candida albicans BCX 7/13>> Ucx 7/13>> Sputum cx>>  ANTIBIOTICS: Zosyn 7/7 >>7/13 Vanco 7/7 >>7/10 Vancomycin PO 7/13>> Zyvox 7/14 > Meropenem 7/14>  SIGNIFICANT EVENTS: 7/07 Admit with N/V, weakness, hypotension  7/8 >> intubated 7/12>> extubated 7/13>> intubated  LINES/TUBES: ETT 7/8 >>7/12 ETT 7/13>> CVC LIJ 7/8 >> Urethral cath 7/8>> NGT 7/8>>  DISCUSSION: 71 y/o female septic shock, aspiration and elevated troponin likely 2/2 demand ischemia vs: NSTEMI. Resp distress 7/12 s/p extubation, now reintubated. Continued significant leukocytosis with watery stools. Repeating blood, urine, sputum cultures and empirically treating for c. Diff.   ASSESSMENT / PLAN:  PULMONARY A: Acute Respiratory Failure Centrilobular Emphysema  B/L effusions. Not concerning for empyema P: Continue vent support Brovana/pulmicort, mucinex Start duoneb Assess pleural effusions with bedside US  CARDIOVASCULAR A:  Bradycardia, with junctional escape rhythm and transcutaneous pacing overnight  Elevated Troponin - suspect demand ischemia, 3.45>2.87 Hx Grade II Diastolic Dysfunction, LVEF Hx Carotid Stenosis  Shock: presumably septic vs cardiogenic P: Wean levo off.  Dobutamine stopped yesterday Cardiology following; recommend considering transvenous pacing if arrythmia continues Follow repeat echo and Coox  RENAL A:  AKI Non-gap metabolic acidosis Hypokalemia, resolved Hypocalcemia: 6.7  P: Good response to lasix. Continue at 61m/day  GASTROINTESTINAL A:  Nausea / Vomiting, Weight Loss - ? If related to Prolia  Elevated alk phos without evidence of biliary obstruction Watery stools with significant leukocytosis to 38 and LA to 7.6 from 6.0 P: Tune feeds on hold due to  nausea  HEMATOLOGIC A:  Anemia, stable. Hgb 8,1 P: Monitor for bleeding Transfusion threshold <8 lovenox  INFECTIOUS A:  Aspiration pneumonia ? c.diff P: C. Diff pending Continue abx as above Repeat, urine, blood and sputum cultures pending  ENDOCRINE A:  Hyperglycemia  P: SSI  NEUROLOGIC A:  Mild confusion/acute encephalopathy due to sepsis/shock P: RASS goal: -1 Fentanyl gtt Versed prn SBT daily ativan 171mBID PRN   FAMILY - Updates: Opdated husband and son at bedside at length - Inter-disciplinary family meet or Palliative Care meeting due by: day 7  The patient is critically ill with multiple organ system failure and requires high complexity decision making for assessment and support, frequent evaluation and titration of therapies, advanced monitoring, review of radiographic studies and interpretation of complex data.   Critical Care Time devoted to patient care services, exclusive of separately billable procedures, described in this note is 35 minutes.   PrMarshell GarfinkelD [facility] Pager [phone] no answer or after 3pm call: [phone] 04/27/2017, 10:46 AM

## 2017-04-27 NOTE — Progress Notes (Signed)
CRITICAL VALUE ALERT  Critical Value:  Calcium 6.3  Date & Time Notied:  04/27/2017 05:03  Provider Notified: Shawna Orleans, E.  Orders Received/Actions taken: Replace calcium

## 2017-04-27 NOTE — Progress Notes (Addendum)
Progress Note  Patient Name: Alexandra House Date of Encounter: 04/27/2017  Primary Cardiologist: Dr. Stanford Breed  Subjective   Feeling anxious  Inpatient Medications    Scheduled Meds: . arformoterol  15 mcg Nebulization BID  . aspirin  325 mg Per Tube Daily  . budesonide (PULMICORT) nebulizer solution  0.5 mg Nebulization BID  . chlorhexidine gluconate (MEDLINE KIT)  15 mL Mouth Rinse BID  . Chlorhexidine Gluconate Cloth  6 each Topical Daily  . fentaNYL (SUBLIMAZE) injection  50 mcg Intravenous Once  . fentaNYL (SUBLIMAZE) injection  50 mcg Intravenous Once  . [START ON 04/28/2017] furosemide  80 mg Intravenous Daily  . guaiFENesin  600 mg Oral BID  . heparin subcutaneous  5,000 Units Subcutaneous Q8H  . hydrocortisone sod succinate (SOLU-CORTEF) inj  50 mg Intravenous Q6H  . insulin aspart  1-3 Units Subcutaneous Q4H  . mouth rinse  15 mL Mouth Rinse QID  . pantoprazole (PROTONIX) IV  40 mg Intravenous Q12H  . sodium chloride flush  10-40 mL Intracatheter Q12H  . sodium chloride flush  3 mL Intravenous Q12H  . vancomycin  500 mg Per Tube Q6H   Continuous Infusions: . sodium chloride 10 mL/hr at 04/27/17 0400  . sodium chloride    . DOBUTamine Stopped (04/26/17 1817)  . feeding supplement (VITAL AF 1.2 CAL) Stopped (04/25/17 1111)  . fentaNYL infusion INTRAVENOUS 25 mcg/hr (04/27/17 0800)  . linezolid (ZYVOX) IV Stopped (04/27/17 1026)  . meropenem (MERREM) IV Stopped (04/27/17 0951)  . norepinephrine (LEVOPHED) Adult infusion 6 mcg/min (04/27/17 1100)   PRN Meds: Place/Maintain arterial line **AND** sodium chloride, acetaminophen **OR** acetaminophen, fentaNYL, ipratropium-albuterol, midazolam, midazolam, ondansetron **OR** ondansetron (ZOFRAN) IV, sodium chloride flush   Vital Signs    Vitals:   04/27/17 1124 04/27/17 1130 04/27/17 1152 04/27/17 1200  BP:  (!) 73/64  126/76  Pulse:  95  89  Resp: (!) 24 (!) 22  (!) 22  Temp:   97.6 F (36.4 C)   TempSrc:    Oral   SpO2: 100% 100%  100%  Weight:      Height:        Intake/Output Summary (Last 24 hours) at 04/27/17 1207 Last data filed at 04/27/17 1100  Gross per 24 hour  Intake          3867.37 ml  Output             6645 ml  Net         -2777.63 ml   Filed Weights   04/25/17 0315 04/26/17 0300 04/27/17 0500  Weight: 54.7 kg (120 lb 9.5 oz) 53.8 kg (118 lb 9.7 oz) 53.7 kg (118 lb 6.2 oz)    Telemetry    Sinus arrhythmia, sinus rhythm.  Junctional rhythm in 60s.  PVCs. - Personally Reviewed  ECG    03/27/18: Junctional rhythm.  Rate 45 bpm. Non-specific ST changes and TWI.  QTc 505 ms.  - Personally Reviewed  Physical Exam   GEN: Intubated.  Minimal sedation.  Mild acute distress.   Neck: No JVD Cardiac: RRR, no murmurs, rubs, or gallops.  Respiratory: Vented breath sounds anteriorly.  No crackles, wheezes or rhonchi GI: Soft, nontender, non-distended  MS: No edema; No deformity. Neuro:  Nonfocal  Psych: Normal affect   Labs    Chemistry Recent Labs Lab 04/21/17 1357  04/23/17 1046  04/25/17 0433 04/26/17 0217 04/27/17 0313  NA 144  < >  --   < > 138 138  137  K 3.1*  < >  --   < > 4.4 5.3* 3.3*  CL 108  < >  --   < > 113* 110 97*  CO2 18*  < >  --   < > 18* 13* 30  GLUCOSE 163*  < >  --   < > 137* 91 138*  BUN 32*  < >  --   < > 38* 48* 40*  CREATININE 2.55*  < >  --   < > 1.53* 1.93* 1.73*  CALCIUM 5.3*  < >  --   < > 6.2* 6.7* 6.3*  PROT 4.6*  --  4.8*  --   --   --  5.2*  ALBUMIN 1.8*  --  1.7*  --   --   --  1.8*  AST 195*  --  70*  --   --   --  510*  ALT 71*  --  76*  --   --   --  261*  ALKPHOS 142*  --  155*  --   --   --  256*  BILITOT 0.5  --  0.2*  --   --   --  0.7  GFRNONAA 18*  < >  --   < > 33* 25* 28*  GFRAA 20*  < >  --   < > 38* 29* 33*  ANIONGAP 18*  < >  --   < > '7 15 10  ' < > = values in this interval not displayed.   Hematology Recent Labs Lab 04/25/17 0433 04/26/17 0217 04/27/17 0313  WBC 37.8* 38.3* 37.0*  RBC 2.93* 3.10* 3.16*   HGB 8.1* 8.5* 8.8*  HCT 25.9* 27.4* 27.4*  MCV 88.4 88.4 86.7  MCH 27.6 27.4 27.8  MCHC 31.3 31.0 32.1  RDW 15.4 16.8* 17.4*  PLT 602* 564* 556*    Cardiac Enzymes Recent Labs Lab 04/24/17 0330 04/25/17 0800 04/26/17 0507 04/26/17 0842  TROPONINI 3.61* 3.45* 2.87* 2.28*   No results for input(s): TROPIPOC in the last 168 hours.   BNP Recent Labs Lab 04/21/17 1357  BNP 1,528.1*     DDimer No results for input(s): DDIMER in the last 168 hours.   Radiology    Ct Abdomen Pelvis Wo Contrast  Result Date: 04/26/2017 CLINICAL DATA:  Septic shock. EXAM: CT CHEST, ABDOMEN AND PELVIS WITHOUT CONTRAST TECHNIQUE: Multidetector CT imaging of the chest, abdomen and pelvis was performed following the standard protocol without IV contrast. COMPARISON:  CT abdomen 04/20/2017 FINDINGS: CT CHEST FINDINGS Cardiovascular: Coronary artery calcification and aortic atherosclerotic calcification. Mediastinum/Nodes: No axillary or supraclavicular adenopathy. Small paratracheal and prevascular lymph nodes. No pericardial fluid. NG tube extends into the stomach. Endotracheal to extends into the trachea several cm from carina. Lungs/Pleura: Bilateral moderate size pleural effusions. There is bibasilar patchy airspace disease. No large consolidation. Mild interlobular septal thickening Musculoskeletal: No acute osseous abnormality. CT ABDOMEN AND PELVIS FINDINGS Hepatobiliary: Nonionic contrast imaging demonstrates no focal hepatic lesion. Gallbladder normal. Pancreas: Pancreas is normal. No ductal dilatation. No pancreatic inflammation. Spleen: Normal spleen Adrenals/urinary tract: Adrenal glands are normal. No hydronephrosis. Foley catheter within the bladder. Stomach/Bowel: Feeding tube extends the gastric antrum. No bowel obstruction. No bowel inflammation evident. Appendix is normal. Colon and rectosigmoid colon are normal. Vascular/Lymphatic: Abdominal aorta is normal caliber with atherosclerotic  calcification. There is no retroperitoneal or periportal lymphadenopathy. No pelvic lymphadenopathy. Reproductive: Uterus and adnexa normal. Other: Small amount free fluid the pelvis. Significant anasarca of  the soft tissues the abdomen pelvis. No evidence abscess in the abdomen pelvis. Musculoskeletal: No aggressive osseous lesion. IMPRESSION: Chest Impression: 1. Bilateral moderate-sized pleural effusions. 2. Patchy airspace disease in lower lobes representing pneumonia or aspiration pneumonitis. No large consolidation. 3. Mild interstitial edema. 4. Endotracheal tube appears in good position. Abdomen / Pelvis Impression: 1. Anasarca of the soft tissues. 2. Small amount intraperitoneal fluid. 3. No evidence pelvic abscess.  No bowel obstruction. 4. Feeding tube with tip gastric antrum. 5. No hydronephrosis.  Foley catheter in bladder. Electronically Signed   By: Suzy Bouchard M.D.   On: 04/26/2017 17:07   Ct Chest Wo Contrast  Result Date: 04/26/2017 CLINICAL DATA:  Septic shock. EXAM: CT CHEST, ABDOMEN AND PELVIS WITHOUT CONTRAST TECHNIQUE: Multidetector CT imaging of the chest, abdomen and pelvis was performed following the standard protocol without IV contrast. COMPARISON:  CT abdomen 04/20/2017 FINDINGS: CT CHEST FINDINGS Cardiovascular: Coronary artery calcification and aortic atherosclerotic calcification. Mediastinum/Nodes: No axillary or supraclavicular adenopathy. Small paratracheal and prevascular lymph nodes. No pericardial fluid. NG tube extends into the stomach. Endotracheal to extends into the trachea several cm from carina. Lungs/Pleura: Bilateral moderate size pleural effusions. There is bibasilar patchy airspace disease. No large consolidation. Mild interlobular septal thickening Musculoskeletal: No acute osseous abnormality. CT ABDOMEN AND PELVIS FINDINGS Hepatobiliary: Nonionic contrast imaging demonstrates no focal hepatic lesion. Gallbladder normal. Pancreas: Pancreas is normal. No  ductal dilatation. No pancreatic inflammation. Spleen: Normal spleen Adrenals/urinary tract: Adrenal glands are normal. No hydronephrosis. Foley catheter within the bladder. Stomach/Bowel: Feeding tube extends the gastric antrum. No bowel obstruction. No bowel inflammation evident. Appendix is normal. Colon and rectosigmoid colon are normal. Vascular/Lymphatic: Abdominal aorta is normal caliber with atherosclerotic calcification. There is no retroperitoneal or periportal lymphadenopathy. No pelvic lymphadenopathy. Reproductive: Uterus and adnexa normal. Other: Small amount free fluid the pelvis. Significant anasarca of the soft tissues the abdomen pelvis. No evidence abscess in the abdomen pelvis. Musculoskeletal: No aggressive osseous lesion. IMPRESSION: Chest Impression: 1. Bilateral moderate-sized pleural effusions. 2. Patchy airspace disease in lower lobes representing pneumonia or aspiration pneumonitis. No large consolidation. 3. Mild interstitial edema. 4. Endotracheal tube appears in good position. Abdomen / Pelvis Impression: 1. Anasarca of the soft tissues. 2. Small amount intraperitoneal fluid. 3. No evidence pelvic abscess.  No bowel obstruction. 4. Feeding tube with tip gastric antrum. 5. No hydronephrosis.  Foley catheter in bladder. Electronically Signed   By: Suzy Bouchard M.D.   On: 04/26/2017 17:07   Portable Chest Xray  Result Date: 04/27/2017 CLINICAL DATA:  Smoker. Respiratory failure; on vent. EXAM: PORTABLE CHEST 1 VIEW COMPARISON:  04/26/2017 FINDINGS: Irregular interstitial thickening noted on the prior study has improved. There is less opacity at the lung bases. There still mild bilateral interstitial thickening associated with prominent bronchovascular markings. Lungs are hyperexpanded. Residual hazy lung base opacity is most noted on the left consistent with a small pleural effusion. No pneumothorax. The endotracheal tube, nasogastric tube and left internal jugular central venous  line are stable and well positioned. IMPRESSION: 1. Improved lung aeration when compared to the previous day's study. This is most consistent with improved interstitial pulmonary edema and decreased pleural effusions. No new abnormalities. 2. Support apparatus is stable and well positioned. Electronically Signed   By: Lajean Manes M.D.   On: 04/27/2017 07:36   Dg Chest Port 1 View  Result Date: 04/26/2017 CLINICAL DATA:  Endotracheal tube repositioning. EXAM: PORTABLE CHEST 1 VIEW COMPARISON:  Radiograph of same day. FINDINGS: Stable cardiomediastinal  silhouette. Nasogastric tube is seen entering the stomach. Left internal jugular catheter is unchanged with distal tip in expected position of the SVC. Endotracheal tube has been partially withdrawn with distal tip 2 cm above the carina. Stable bilateral perihilar and basilar interstitial densities are noted concerning for edema with mild associated pleural effusions. Bony thorax unremarkable. IMPRESSION: Endotracheal tube is in grossly good position at this time. Stable bilateral lung opacities are noted consistent with edema and possible effusions. Electronically Signed   By: Marijo Conception, M.D.   On: 04/26/2017 10:27   Dg Chest Port 1 View  Result Date: 04/26/2017 CLINICAL DATA:  Hypoxia EXAM: PORTABLE CHEST 1 VIEW COMPARISON:  Study obtained earlier in the day FINDINGS: Endotracheal tube tip is at the carina with the tip directed toward the right main bronchus. Left jugular catheter tip is in the superior cava. Nasogastric tube tip and side port below the diaphragm. No pneumothorax. There are small pleural effusions bilaterally with mild interstitial edema. No consolidation appreciable. Heart is mildly enlarged with pulmonary venous hypertension. No evident adenopathy. No bone lesions. IMPRESSION: Tube and catheter positions as described without pneumothorax. Note that the endotracheal tube tip is at the carina directed toward the right main bronchus.  Advise withdrawing endotracheal tube approximately 3 cm. Findings indicative of a degree of congestive heart failure. No appreciable consolidation. Critical Value/emergent results were called by telephone at the time of interpretation on 04/26/2017 at 7:08 am to Josue Hector, RN , who verbally acknowledged these results. Electronically Signed   By: Lowella Grip III M.D.   On: 04/26/2017 07:10   Dg Chest Port 1 View  Result Date: 04/26/2017 CLINICAL DATA:  Initial evaluation for acute respiratory failure. EXAM: PORTABLE CHEST 1 VIEW COMPARISON:  Prior radiograph from 04/25/2017. FINDINGS: Endotracheal and enteric tubes have been removed. Left IJ approach centra venous catheter remains in place with tip overlying the cavoatrial junction. Cardiac and mediastinal silhouettes are stable, and remain within normal limits. Lungs normally inflated. Probable small bilateral pleural effusions. Left basilar opacity likely reflects atelectasis. Mild perihilar vascular congestion with interstitial prominence, suggesting mild pulmonary interstitial edema. Changes slightly increased from previous. No pneumothorax. Osseous structures unchanged. IMPRESSION: 1. Slightly worsened pulmonary vascular congestion with diffuse interstitial prominence, suggesting mild pulmonary interstitial edema. Probable small bilateral pleural effusions. 2. Interval extubation. Electronically Signed   By: Jeannine Boga M.D.   On: 04/26/2017 01:56   Dg Abd Portable 1v  Result Date: 04/26/2017 CLINICAL DATA:  Colitis. EXAM: PORTABLE ABDOMEN - 1 VIEW COMPARISON:  04/21/2017 FINDINGS: Nasogastric terminates at the distal stomach. Extensive overlying support apparatus including EKG leads and wires. No gross free intraperitoneal air. Nonobstructive bowel gas pattern. Distal gas and stool. Vascular calcifications. IMPRESSION: No acute findings. Electronically Signed   By: Abigail Miyamoto M.D.   On: 04/26/2017 09:11    Cardiac Studies    Echo 04/21/17: Study Conclusions  - Left ventricle: The cavity size was normal. Wall thickness was   normal. Systolic function was mildly reduced. The estimated   ejection fraction was 45%. - Regional wall motion abnormality: Hypokinesis of the basal-mid   anteroseptal, basal-mid inferoseptal, basal-mid inferior, and   apical septal myocardium. - Mitral valve: There was moderate regurgitation. - Right ventricle: Systolic function was mildly reduced. - Tricuspid valve: There was mild regurgitation.  Patient Profile     71 y.o. 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  Assessment & Plan    # Sinus bradycardia:   # Junctional rhythm:  Ms. Fretz was bradycardic to the 30s on admission and required external pacing.  She responded well to dobutamine.  Dobutamine has been weaned and her heart rates remain in the 70s.  Weaning norepinephrine.  Hold all nodal agents.  She will need an ischemia evaluation when stable. Will consider transvenous pacing if necessary, though the risk of infection is high.  # Chronic systolic and diastolic heart failure: Ms. Magnussen is still requiring pressors.  She is tolerating lasix.  Monitor renal function closely.   She is +14L this admission 2/2 fluid resuscitation.   # Elevated troponin: Likely demand ischemia.  Troponin peaked at 4.39.  She denies chest pain.  Ischemia evaluation when able.  Continue aspirin.    # AKI: Renal function improving.  Creatinine was 1.02 04/07/17 and up to 3.09 this admission. Watch closely with diuresis.  Time spent: 60 minutes-Greater than 50% of this time was spent in counseling, explanation of diagnosis, planning of further management, and coordination of care.   Signed, Skeet Latch, MD  04/27/2017, 12:07 PM

## 2017-04-27 NOTE — Progress Notes (Signed)
S:intubated O:BP 136/75   Pulse 98   Temp 97.7 F (36.5 C) (Oral)   Resp (!) 29   Ht '4\' 10"'  (1.473 m)   Wt 53.7 kg (118 lb 6.2 oz)   SpO2 100%   BMI 24.74 kg/m   Intake/Output Summary (Last 24 hours) at 04/27/17 0706 Last data filed at 04/27/17 0600  Gross per 24 hour  Intake          3906.55 ml  Output             5025 ml  Net         -1118.45 ml   Weight change: -0.1 kg (-3.5 oz) NAT:FTDDU and alert, intubated CVS:RRR Resp: Decrease BS bases Abd:+ BS NTND Ext: 1+ edema NEURO:Awake and alert, mouthing words, follows commands Lt subclavian triple lumen   . arformoterol  15 mcg Nebulization BID  . aspirin  325 mg Per Tube Daily  . budesonide (PULMICORT) nebulizer solution  0.5 mg Nebulization BID  . chlorhexidine gluconate (MEDLINE KIT)  15 mL Mouth Rinse BID  . Chlorhexidine Gluconate Cloth  6 each Topical Daily  . fentaNYL (SUBLIMAZE) injection  50 mcg Intravenous Once  . fentaNYL (SUBLIMAZE) injection  50 mcg Intravenous Once  . furosemide  80 mg Intravenous Q8H  . guaiFENesin  600 mg Oral BID  . heparin subcutaneous  5,000 Units Subcutaneous Q8H  . hydrocortisone sod succinate (SOLU-CORTEF) inj  50 mg Intravenous Q6H  . insulin aspart  1-3 Units Subcutaneous Q4H  . mouth rinse  15 mL Mouth Rinse QID  . pantoprazole (PROTONIX) IV  40 mg Intravenous Q12H  . sodium chloride flush  10-40 mL Intracatheter Q12H  . sodium chloride flush  3 mL Intravenous Q12H  . vancomycin  500 mg Per Tube Q6H   Ct Abdomen Pelvis Wo Contrast  Result Date: 04/26/2017 CLINICAL DATA:  Septic shock. EXAM: CT CHEST, ABDOMEN AND PELVIS WITHOUT CONTRAST TECHNIQUE: Multidetector CT imaging of the chest, abdomen and pelvis was performed following the standard protocol without IV contrast. COMPARISON:  CT abdomen 04/20/2017 FINDINGS: CT CHEST FINDINGS Cardiovascular: Coronary artery calcification and aortic atherosclerotic calcification. Mediastinum/Nodes: No axillary or supraclavicular adenopathy.  Small paratracheal and prevascular lymph nodes. No pericardial fluid. NG tube extends into the stomach. Endotracheal to extends into the trachea several cm from carina. Lungs/Pleura: Bilateral moderate size pleural effusions. There is bibasilar patchy airspace disease. No large consolidation. Mild interlobular septal thickening Musculoskeletal: No acute osseous abnormality. CT ABDOMEN AND PELVIS FINDINGS Hepatobiliary: Nonionic contrast imaging demonstrates no focal hepatic lesion. Gallbladder normal. Pancreas: Pancreas is normal. No ductal dilatation. No pancreatic inflammation. Spleen: Normal spleen Adrenals/urinary tract: Adrenal glands are normal. No hydronephrosis. Foley catheter within the bladder. Stomach/Bowel: Feeding tube extends the gastric antrum. No bowel obstruction. No bowel inflammation evident. Appendix is normal. Colon and rectosigmoid colon are normal. Vascular/Lymphatic: Abdominal aorta is normal caliber with atherosclerotic calcification. There is no retroperitoneal or periportal lymphadenopathy. No pelvic lymphadenopathy. Reproductive: Uterus and adnexa normal. Other: Small amount free fluid the pelvis. Significant anasarca of the soft tissues the abdomen pelvis. No evidence abscess in the abdomen pelvis. Musculoskeletal: No aggressive osseous lesion. IMPRESSION: Chest Impression: 1. Bilateral moderate-sized pleural effusions. 2. Patchy airspace disease in lower lobes representing pneumonia or aspiration pneumonitis. No large consolidation. 3. Mild interstitial edema. 4. Endotracheal tube appears in good position. Abdomen / Pelvis Impression: 1. Anasarca of the soft tissues. 2. Small amount intraperitoneal fluid. 3. No evidence pelvic abscess.  No bowel obstruction. 4. Feeding  tube with tip gastric antrum. 5. No hydronephrosis.  Foley catheter in bladder. Electronically Signed   By: Suzy Bouchard M.D.   On: 04/26/2017 17:07   Ct Chest Wo Contrast  Result Date: 04/26/2017 CLINICAL DATA:   Septic shock. EXAM: CT CHEST, ABDOMEN AND PELVIS WITHOUT CONTRAST TECHNIQUE: Multidetector CT imaging of the chest, abdomen and pelvis was performed following the standard protocol without IV contrast. COMPARISON:  CT abdomen 04/20/2017 FINDINGS: CT CHEST FINDINGS Cardiovascular: Coronary artery calcification and aortic atherosclerotic calcification. Mediastinum/Nodes: No axillary or supraclavicular adenopathy. Small paratracheal and prevascular lymph nodes. No pericardial fluid. NG tube extends into the stomach. Endotracheal to extends into the trachea several cm from carina. Lungs/Pleura: Bilateral moderate size pleural effusions. There is bibasilar patchy airspace disease. No large consolidation. Mild interlobular septal thickening Musculoskeletal: No acute osseous abnormality. CT ABDOMEN AND PELVIS FINDINGS Hepatobiliary: Nonionic contrast imaging demonstrates no focal hepatic lesion. Gallbladder normal. Pancreas: Pancreas is normal. No ductal dilatation. No pancreatic inflammation. Spleen: Normal spleen Adrenals/urinary tract: Adrenal glands are normal. No hydronephrosis. Foley catheter within the bladder. Stomach/Bowel: Feeding tube extends the gastric antrum. No bowel obstruction. No bowel inflammation evident. Appendix is normal. Colon and rectosigmoid colon are normal. Vascular/Lymphatic: Abdominal aorta is normal caliber with atherosclerotic calcification. There is no retroperitoneal or periportal lymphadenopathy. No pelvic lymphadenopathy. Reproductive: Uterus and adnexa normal. Other: Small amount free fluid the pelvis. Significant anasarca of the soft tissues the abdomen pelvis. No evidence abscess in the abdomen pelvis. Musculoskeletal: No aggressive osseous lesion. IMPRESSION: Chest Impression: 1. Bilateral moderate-sized pleural effusions. 2. Patchy airspace disease in lower lobes representing pneumonia or aspiration pneumonitis. No large consolidation. 3. Mild interstitial edema. 4. Endotracheal  tube appears in good position. Abdomen / Pelvis Impression: 1. Anasarca of the soft tissues. 2. Small amount intraperitoneal fluid. 3. No evidence pelvic abscess.  No bowel obstruction. 4. Feeding tube with tip gastric antrum. 5. No hydronephrosis.  Foley catheter in bladder. Electronically Signed   By: Suzy Bouchard M.D.   On: 04/26/2017 17:07   Dg Chest Port 1 View  Result Date: 04/26/2017 CLINICAL DATA:  Endotracheal tube repositioning. EXAM: PORTABLE CHEST 1 VIEW COMPARISON:  Radiograph of same day. FINDINGS: Stable cardiomediastinal silhouette. Nasogastric tube is seen entering the stomach. Left internal jugular catheter is unchanged with distal tip in expected position of the SVC. Endotracheal tube has been partially withdrawn with distal tip 2 cm above the carina. Stable bilateral perihilar and basilar interstitial densities are noted concerning for edema with mild associated pleural effusions. Bony thorax unremarkable. IMPRESSION: Endotracheal tube is in grossly good position at this time. Stable bilateral lung opacities are noted consistent with edema and possible effusions. Electronically Signed   By: Marijo Conception, M.D.   On: 04/26/2017 10:27   Dg Chest Port 1 View  Result Date: 04/26/2017 CLINICAL DATA:  Hypoxia EXAM: PORTABLE CHEST 1 VIEW COMPARISON:  Study obtained earlier in the day FINDINGS: Endotracheal tube tip is at the carina with the tip directed toward the right main bronchus. Left jugular catheter tip is in the superior cava. Nasogastric tube tip and side port below the diaphragm. No pneumothorax. There are small pleural effusions bilaterally with mild interstitial edema. No consolidation appreciable. Heart is mildly enlarged with pulmonary venous hypertension. No evident adenopathy. No bone lesions. IMPRESSION: Tube and catheter positions as described without pneumothorax. Note that the endotracheal tube tip is at the carina directed toward the right main bronchus. Advise  withdrawing endotracheal tube approximately 3 cm. Findings indicative  of a degree of congestive heart failure. No appreciable consolidation. Critical Value/emergent results were called by telephone at the time of interpretation on 04/26/2017 at 7:08 am to Josue Hector, RN , who verbally acknowledged these results. Electronically Signed   By: Lowella Grip III M.D.   On: 04/26/2017 07:10   Dg Chest Port 1 View  Result Date: 04/26/2017 CLINICAL DATA:  Initial evaluation for acute respiratory failure. EXAM: PORTABLE CHEST 1 VIEW COMPARISON:  Prior radiograph from 04/25/2017. FINDINGS: Endotracheal and enteric tubes have been removed. Left IJ approach centra venous catheter remains in place with tip overlying the cavoatrial junction. Cardiac and mediastinal silhouettes are stable, and remain within normal limits. Lungs normally inflated. Probable small bilateral pleural effusions. Left basilar opacity likely reflects atelectasis. Mild perihilar vascular congestion with interstitial prominence, suggesting mild pulmonary interstitial edema. Changes slightly increased from previous. No pneumothorax. Osseous structures unchanged. IMPRESSION: 1. Slightly worsened pulmonary vascular congestion with diffuse interstitial prominence, suggesting mild pulmonary interstitial edema. Probable small bilateral pleural effusions. 2. Interval extubation. Electronically Signed   By: Jeannine Boga M.D.   On: 04/26/2017 01:56   Dg Chest Port 1 View  Result Date: 04/25/2017 CLINICAL DATA:  Respiratory failure EXAM: PORTABLE CHEST 1 VIEW COMPARISON:  04/24/2017 FINDINGS: Support devices are stable. There is hyperinflation of the lungs compatible with COPD. She bilateral perihilar and lower lobe opacities are noted which could reflect edema or pneumonia. Small bilateral effusions. IMPRESSION: Bilateral perihilar and lower lobe opacities could reflect edema or pneumonia. COPD. Electronically Signed   By: Rolm Baptise M.D.    On: 04/25/2017 08:08   Dg Abd Portable 1v  Result Date: 04/26/2017 CLINICAL DATA:  Colitis. EXAM: PORTABLE ABDOMEN - 1 VIEW COMPARISON:  04/21/2017 FINDINGS: Nasogastric terminates at the distal stomach. Extensive overlying support apparatus including EKG leads and wires. No gross free intraperitoneal air. Nonobstructive bowel gas pattern. Distal gas and stool. Vascular calcifications. IMPRESSION: No acute findings. Electronically Signed   By: Abigail Miyamoto M.D.   On: 04/26/2017 09:11   BMET    Component Value Date/Time   NA 137 04/27/2017 0313   K 3.3 (L) 04/27/2017 0313   CL 97 (L) 04/27/2017 0313   CO2 30 04/27/2017 0313   GLUCOSE 138 (H) 04/27/2017 0313   BUN 40 (H) 04/27/2017 0313   CREATININE 1.73 (H) 04/27/2017 0313   CALCIUM 6.3 (LL) 04/27/2017 0313   GFRNONAA 28 (L) 04/27/2017 0313   GFRAA 33 (L) 04/27/2017 0313   CBC    Component Value Date/Time   WBC 37.0 (H) 04/27/2017 0313   RBC 3.16 (L) 04/27/2017 0313   HGB 8.8 (L) 04/27/2017 0313   HCT 27.4 (L) 04/27/2017 0313   PLT 556 (H) 04/27/2017 0313   MCV 86.7 04/27/2017 0313   MCH 27.8 04/27/2017 0313   MCHC 32.1 04/27/2017 0313   RDW 17.4 (H) 04/27/2017 0313   LYMPHSABS 5.0 (H) 04/20/2017 1050   MONOABS 1.1 (H) 04/20/2017 1050   EOSABS 0.0 04/20/2017 1050   BASOSABS 0.0 04/20/2017 1050     Assessment: 1. ARF sec hypotension in setting of bradycardia and sepsis syndrome 2. VDRF 3. Junctional bradycardia 4. Hx COPD 5. Met acidosis, improved 6. Hypocalcemia   Plan: 1. Replace Calcium 2. Dc bicarb gtt 3. Decrease lasix to q d and can be increased if needed 4. Daily SCR 5. Replace K    Alexandra House

## 2017-04-27 NOTE — Progress Notes (Signed)
[facility] Progress Note Patient Name: Alexandra House DOB: [birth year] MRN: [id number]   Date of Service  04/27/2017  HPI/Events of Note  RN requesting order for liquid tylenol as patient is intubated.  eICU Interventions  Tylenol order changed.      Intervention Category Minor Interventions: Routine modifications to care plan (e.g. PRN medications for pain, fever)  Tera Partridge 04/27/2017, 11:25 PM

## 2017-04-28 ENCOUNTER — Inpatient Hospital Stay (HOSPITAL_COMMUNITY): Payer: Medicare HMO

## 2017-04-28 LAB — GLUCOSE, CAPILLARY
GLUCOSE-CAPILLARY: 96 mg/dL (ref 65–99)
GLUCOSE-CAPILLARY: 71 mg/dL (ref 65–99)
GLUCOSE-CAPILLARY: 28 mg/dL — AB (ref 65–99)
GLUCOSE-CAPILLARY: 268 mg/dL — AB (ref 65–99)
Glucose-Capillary: 111 mg/dL — ABNORMAL HIGH (ref 65–99)
Glucose-Capillary: 118 mg/dL — ABNORMAL HIGH (ref 65–99)
Glucose-Capillary: 191 mg/dL — ABNORMAL HIGH (ref 65–99)

## 2017-04-28 LAB — CBC
HEMATOCRIT: 28.1 % — AB (ref 36.0–46.0)
HEMOGLOBIN: 9.3 g/dL — AB (ref 12.0–15.0)
MCH: 28.4 pg (ref 26.0–34.0)
MCHC: 33.1 g/dL (ref 30.0–36.0)
MCV: 85.7 fL (ref 78.0–100.0)
Platelets: 550 10*3/uL — ABNORMAL HIGH (ref 150–400)
RBC: 3.28 MIL/uL — AB (ref 3.87–5.11)
RDW: 19.5 % — ABNORMAL HIGH (ref 11.5–15.5)
WBC: 22.7 10*3/uL — ABNORMAL HIGH (ref 4.0–10.5)

## 2017-04-28 LAB — RENAL FUNCTION PANEL
ANION GAP: 12 (ref 5–15)
Albumin: 1.9 g/dL — ABNORMAL LOW (ref 3.5–5.0)
BUN: 30 mg/dL — ABNORMAL HIGH (ref 6–20)
CHLORIDE: 95 mmol/L — AB (ref 101–111)
CO2: 28 mmol/L (ref 22–32)
Calcium: 6.5 mg/dL — ABNORMAL LOW (ref 8.9–10.3)
Creatinine, Ser: 1.62 mg/dL — ABNORMAL HIGH (ref 0.44–1.00)
GFR calc non Af Amer: 30 mL/min — ABNORMAL LOW (ref >60)
GFR, EST AFRICAN AMERICAN: 35 mL/min — AB (ref >60)
Glucose, Bld: 93 mg/dL (ref 65–99)
POTASSIUM: 3 mmol/L — AB (ref 3.5–5.1)
Phosphorus: 3 mg/dL (ref 2.5–4.6)
Sodium: 135 mmol/L (ref 135–145)

## 2017-04-28 LAB — CULTURE, RESPIRATORY W GRAM STAIN

## 2017-04-28 LAB — BASIC METABOLIC PANEL
ANION GAP: 8 (ref 5–15)
BUN: 32 mg/dL — ABNORMAL HIGH (ref 6–20)
CALCIUM: 6.5 mg/dL — AB (ref 8.9–10.3)
CHLORIDE: 95 mmol/L — AB (ref 101–111)
CO2: 29 mmol/L (ref 22–32)
CREATININE: 1.58 mg/dL — AB (ref 0.44–1.00)
GFR calc non Af Amer: 31 mL/min — ABNORMAL LOW (ref >60)
GFR, EST AFRICAN AMERICAN: 36 mL/min — AB (ref >60)
GLUCOSE: 95 mg/dL (ref 65–99)
Potassium: 3.5 mmol/L (ref 3.5–5.1)
Sodium: 132 mmol/L — ABNORMAL LOW (ref 135–145)

## 2017-04-28 LAB — CULTURE, BLOOD (ROUTINE X 2)
Culture: NO GROWTH
Culture: NO GROWTH
Special Requests: ADEQUATE
Special Requests: ADEQUATE

## 2017-04-28 LAB — PHOSPHORUS: PHOSPHORUS: 3.2 mg/dL (ref 2.5–4.6)

## 2017-04-28 LAB — CULTURE, RESPIRATORY

## 2017-04-28 LAB — MAGNESIUM
Magnesium: 1.5 mg/dL — ABNORMAL LOW (ref 1.7–2.4)
Magnesium: 2.5 mg/dL — ABNORMAL HIGH (ref 1.7–2.4)

## 2017-04-28 MED ORDER — SODIUM CHLORIDE 0.9 % IV SOLN
1.0000 g | Freq: Once | INTRAVENOUS | Status: AC
  Administered 2017-04-28: 1 g via INTRAVENOUS
  Filled 2017-04-28: qty 10

## 2017-04-28 MED ORDER — DEXTROSE 50 % IV SOLN
INTRAVENOUS | Status: AC
  Administered 2017-04-28: 50 mL
  Filled 2017-04-28: qty 50

## 2017-04-28 MED ORDER — VANCOMYCIN 50 MG/ML ORAL SOLUTION
500.0000 mg | Freq: Four times a day (QID) | ORAL | Status: DC
  Administered 2017-04-28 – 2017-04-29 (×3): 500 mg via ORAL
  Filled 2017-04-28 (×8): qty 10

## 2017-04-28 MED ORDER — DEXTROSE 5 % IV SOLN
3.0000 g | Freq: Once | INTRAVENOUS | Status: AC
  Administered 2017-04-28: 3 g via INTRAVENOUS
  Filled 2017-04-28: qty 6

## 2017-04-28 MED ORDER — POTASSIUM CHLORIDE 20 MEQ/15ML (10%) PO SOLN: 60.0000 meq | Freq: Once | ORAL | Status: DC

## 2017-04-28 MED ORDER — FUROSEMIDE 10 MG/ML IJ SOLN
80.0000 mg | Freq: Once | INTRAMUSCULAR | Status: AC
  Administered 2017-04-28: 80 mg via INTRAVENOUS

## 2017-04-28 MED ORDER — POTASSIUM CHLORIDE 10 MEQ/50ML IV SOLN
10.0000 meq | INTRAVENOUS | Status: AC
  Administered 2017-04-28 (×6): 10 meq via INTRAVENOUS
  Filled 2017-04-28 (×6): qty 50

## 2017-04-28 NOTE — Progress Notes (Addendum)
[facility] Progress Note Patient Name: Alexandra House DOB: 1946/05/05 MRN: [id number]   Date of Service  04/28/2017  HPI/Events of Note  Potassium 3.0 & magnesium 1.5.   eICU Interventions  1. Magnesium sulfate 3 g IV 2. KCl 60 mEq IV     Intervention Category Intermediate Interventions: Electrolyte abnormality - evaluation and management  Tera Partridge 04/28/2017, 6:17 AM

## 2017-04-28 NOTE — Progress Notes (Signed)
PULMONARY / CRITICAL CARE MEDICINE   Name: Alexandra House MRN: [id number] DOB: Aug 31, 1946    ADMISSION DATE:04/20/2017 REFERRING MD:Dr. Aileen Fass   CHIEF COMPLAINT:AMS, Hypotension, Hypothermia, Bradycardia   HISTORY OF PRESENT ILLNESS: [name] who initially presented with bradycardia in the 30's, hypotension (79/39), tachypnea, hypothermic. She was treated with IVF with improvement in BP. The patient was admitted per [facility] and treated for suspected sepsis. She developed worsening hypotension, bradycardia, altered mental status and increased work of breathing on 7/8. PCCM called back for re-evaluation. Extubated 7/12, became anxious overnight and tachypneic requiring bipap and ultimately reintubation. Additionally, bradycardic to 30s started on external pacing. Dobutamine restarted at 20 and levophed to 40 and bicarb drip. Ucx, bcx, sputum cultures and  Currently intubated and on pressors.   SUBJECTIVE: Off pressors today Urine output is picking up  VITAL SIGNS: BP (!) 80/45   Pulse (!) 50   Temp 98.3 F (36.8 C) (Axillary)   Resp 16   Ht _0  (1.473 m)   Wt 111 lb 12.4 oz (50.7 kg)   SpO2 100%   BMI 23.36 kg/m   HEMODYNAMICS:    VENTILATOR SETTINGS: Vent Mode: PRVC FiO2 (%):  [40 %] 40 % Set Rate:  [16 bmp] 16 bmp Vt Set:  [360 mL] 360 mL PEEP:  [5 cmH20] 5 cmH20 Plateau Pressure:  [12 cmH20-16 cmH20] 14 cmH20  INTAKE / OUTPUT: I/O last 3 completed shifts: In: 3341.6 [I.V.:1641.6; NG/GT:130; IV Piggyback:1570] Out: 8425 [Urine:8275; Emesis/NG output:150]  PHYSICAL EXAMINATION: Gen:      No acute distress HEENT:  EOMI, sclera anicteric, ETT in place Neck:     No masses; no thyromegaly Lungs:    Clear to auscultation bilaterally; normal respiratory effort CV:         Regular rate and rhythm; no murmurs Abd:      + bowel sounds; soft, non-tender; no palpable masses, no distension Ext:    1-2+ edema; adequate peripheral perfusion Skin:      Warm and  dry; no rash Neuro: Awake, responsive  LABS:  BMET  Recent Labs Lab 04/26/17 0217 04/27/17 0313 04/28/17 0357  NA 138 137 135  K 5.3* 3.3* 3.0*  CL 110 97* 95*  CO2 13* 30 28  BUN 48* 40* 30*  CREATININE 1.93* 1.73* 1.62*  GLUCOSE 91 138* 93    Electrolytes  Recent Labs Lab 04/26/17 0217 04/26/17 1330 04/27/17 0313 04/28/17 0357  CALCIUM 6.7*  --  6.3* 6.5*  MG  --   --   --  1.5*  PHOS  --  2.9  --  3.0    CBC  Recent Labs Lab 04/26/17 0217 04/27/17 0313 04/28/17 0357  WBC 38.3* 37.0* 22.7*  HGB 8.5* 8.8* 9.3*  HCT 27.4* 27.4* 28.1*  PLT 564* 556* 550*    Coag's  Recent Labs Lab 04/23/17 0745  APTT 68*    Sepsis Markers  Recent Labs Lab 04/23/17 1046  04/25/17 1157 04/26/17 0230 04/26/17 0657 04/26/17 1539  LATICACIDVEN  --   < >  --  6.0* 7.6* 2.5*  PROCALCITON 0.72  --  0.68  --   --   --   < > = values in this interval not displayed.  ABG  Recent Labs Lab 04/26/17 0206 04/26/17 0640 04/26/17 0820  PHART 7.352 7.231* 7.333*  PCO2ART 18.6* 24.4* 25.1*  PO2ART 109.0* 97.4 126*    Liver Enzymes  Recent Labs Lab 04/23/17 1046 04/27/17 0313 04/28/17 0357  AST 70*  510*  --   ALT 76* 261*  --   ALKPHOS 155* 256*  --   BILITOT 0.2* 0.7  --   ALBUMIN 1.7* 1.8* 1.9*    Cardiac Enzymes  Recent Labs Lab 04/25/17 0800 04/26/17 0507 04/26/17 0842  TROPONINI 3.45* 2.87* 2.28*    Glucose  Recent Labs Lab 04/27/17 2006 04/27/17 2343 04/28/17 0354 04/28/17 0848 04/28/17 1134 04/28/17 1513  GLUCAP 106* 161* 96 111* 191* 71    Imaging Dg Chest Port 1 View  Result Date: 04/28/2017 CLINICAL DATA:  ET tube, acute respiratory failure EXAM: PORTABLE CHEST 1 VIEW COMPARISON:  04/27/2017 FINDINGS: Endotracheal tube is 4 cm above the carina, stable. Remainder of the support devices are stable. Biapical pleural thickening/ scarring. Left lower lobe atelectasis or infiltrate with small left effusion. Heart is upper limits  normal in size. Mild interstitial prominence is stable. IMPRESSION: No significant change since prior study. Electronically Signed   By: Rolm Baptise M.D.   On: 04/28/2017 07:41     STUDIES: 7/05 [place] ABD >>abdominal US on 7/5 which was negative for gallstones, hepatic abnormality, normal pancreas &no hydronephrosis.  7/7 CT chest/abdomen/pelvis> emphysema, mult-lobar pneumonia, fatty liver, fat stranding around head of pancreas 7/13 CT chest/abdomen/pelvis> Moderate B/L effusions, patchy air space disease at bases. anasarca  CULTURES: BCx2 7/7>> final negative UC 7/7 >>final negative BCX 7/10>>  NGTD x2 days resp culture 7/10 >> few candida albicans BCX 7/13>> Ucx 7/13>> Sputum cx>>  ANTIBIOTICS: Zosyn 7/7 >>7/13 Vanco 7/7 >>7/10 Vancomycin PO 7/13>> Zyvox 7/14 > Meropenem 7/14>  SIGNIFICANT EVENTS: 7/07 Admit with N/V, weakness, hypotension  7/8 >> intubated 7/12>> extubated 7/13>> intubated  LINES/TUBES: ETT 7/8 >>7/12 ETT 7/13>> CVC LIJ 7/8 >> Urethral cath 7/8>> NGT 7/8>>  DISCUSSION: 71 y/o female septic shock, aspiration and elevated troponin likely 2/2 demand ischemia vs: NSTEMI. Resp distress 7/12 s/p extubation, now reintubated. Continued significant leukocytosis with watery stools. Repeating blood, urine, sputum cultures and empirically treating for c. Diff.   ASSESSMENT / PLAN:  PULMONARY A: Acute Respiratory Failure Centrilobular Emphysema  Aspiration PNA B/L effusions. Low suspicion for empyema On [place] they are free flowing with no loculations.  P: Continue vent support Brovana/pulmicort, mucinex Continue duobnebs  CARDIOVASCULAR A:  Bradycardia, with junctional escape rhythm and transcutaneous pacing overnight  Elevated Troponin - suspect demand ischemia, 3.45>2.87 Hx Grade II Diastolic Dysfunction, LVEF Hx Carotid Stenosis  Shock: presumably septic vs cardiogenic P: Off dobutamine and levo Follow repeat echo  RENAL A:   AKI Non-gap metabolic acidosis Hypokalemia, resolved Hypocalcemia: 6.7 P: Continue lasix  GASTROINTESTINAL A:  Nausea / Vomiting, Weight Loss - ? If related to Prolia  Elevated alk phos without evidence of biliary obstruction Watery stools with significant leukocytosis to 38 and LA to 7.6 from 6.0 P: Tune feeds on hold due to nausea  HEMATOLOGIC A:  Anemia, stable. Hgb 8,1 P: Monitor for bleeding Transfusion threshold <8 Lovenox  INFECTIOUS A:  Aspiration pneumonia On empiric treatment for C diff P: Continue abx as above  ENDOCRINE A:  Hyperglycemia  P: SSI  NEUROLOGIC A:  Mild confusion/acute encephalopathy due to sepsis/shock P: RASS goal: -1 Fentanyl gtt Versed prn SBT daily ativan 35m BID PRN   FAMILY - Updates: Updated husband and son at bedside at length/ 7/15 - Inter-disciplinary family meet or Palliative Care meeting due by: day 7  The patient is critically ill with multiple organ system failure and requires high complexity decision making for assessment and support, frequent  evaluation and titration of therapies, advanced monitoring, review of radiographic studies and interpretation of complex data.   Critical Care Time devoted to patient care services, exclusive of separately billable procedures, described in this note is 35 minutes.   Marshell Garfinkel MD [facility] Pager [phone] If no answer or after 3pm call: [phone] 04/28/2017, 4:38 PM

## 2017-04-28 NOTE — Progress Notes (Signed)
S:intubated O:BP 127/61   Pulse 91   Temp 98.2 F (36.8 C) (Oral)   Resp (!) 32   Ht _0  (1.473 m)   Wt 50.7 kg (111 lb 12.4 oz)   SpO2 100%   BMI 23.36 kg/m   Intake/Output Summary (Last 24 hours) at 04/28/17 0735 Last data filed at 04/28/17 0700  Gross per 24 hour  Intake          1617.51 ml  Output             5125 ml  Net         -3507.49 ml   Weight change: -3 kg (-6 lb 9.8 oz) SKA:JGOTL and alert, intubated CVS:RRR Resp: Decrease BS bases Abd:+ BS NTND Ext: tr edema NEURO:Awake and alert, mouthing words, follows commands.  Trying to write notes Lt subclavian triple lumen   . arformoterol  15 mcg Nebulization BID  . aspirin  325 mg Per Tube Daily  . budesonide (PULMICORT) nebulizer solution  0.5 mg Nebulization BID  . chlorhexidine gluconate (MEDLINE KIT)  15 mL Mouth Rinse BID  . Chlorhexidine Gluconate Cloth  6 each Topical Daily  . fentaNYL (SUBLIMAZE) injection  50 mcg Intravenous Once  . fentaNYL (SUBLIMAZE) injection  50 mcg Intravenous Once  . furosemide  80 mg Intravenous Daily  . guaiFENesin  600 mg Oral BID  . heparin subcutaneous  5,000 Units Subcutaneous Q8H  . hydrocortisone sod succinate (SOLU-CORTEF) inj  50 mg Intravenous Q6H  . insulin aspart  1-3 Units Subcutaneous Q4H  . mouth rinse  15 mL Mouth Rinse QID  . pantoprazole (PROTONIX) IV  40 mg Intravenous Q12H  . sodium chloride flush  10-40 mL Intracatheter Q12H  . sodium chloride flush  3 mL Intravenous Q12H  . vancomycin  500 mg Per Tube Q6H   Ct Abdomen Pelvis Wo Contrast  Result Date: 04/26/2017 CLINICAL DATA:  Septic shock. EXAM: CT CHEST, ABDOMEN AND PELVIS WITHOUT CONTRAST TECHNIQUE: Multidetector CT imaging of the chest, abdomen and pelvis was performed following the standard protocol without IV contrast. COMPARISON:  CT abdomen 04/20/2017 FINDINGS: CT CHEST FINDINGS Cardiovascular: Coronary artery calcification and aortic atherosclerotic calcification. Mediastinum/Nodes: No axillary  or supraclavicular adenopathy. Small paratracheal and prevascular lymph nodes. No pericardial fluid. NG tube extends into the stomach. Endotracheal to extends into the trachea several cm from carina. Lungs/Pleura: Bilateral moderate size pleural effusions. There is bibasilar patchy airspace disease. No large consolidation. Mild interlobular septal thickening Musculoskeletal: No acute osseous abnormality. CT ABDOMEN AND PELVIS FINDINGS Hepatobiliary: Nonionic contrast imaging demonstrates no focal hepatic lesion. Gallbladder normal. Pancreas: Pancreas is normal. No ductal dilatation. No pancreatic inflammation. Spleen: Normal spleen Adrenals/urinary tract: Adrenal glands are normal. No hydronephrosis. Foley catheter within the bladder. Stomach/Bowel: Feeding tube extends the gastric antrum. No bowel obstruction. No bowel inflammation evident. Appendix is normal. Colon and rectosigmoid colon are normal. Vascular/Lymphatic: Abdominal aorta is normal caliber with atherosclerotic calcification. There is no retroperitoneal or periportal lymphadenopathy. No pelvic lymphadenopathy. Reproductive: Uterus and adnexa normal. Other: Small amount free fluid the pelvis. Significant anasarca of the soft tissues the abdomen pelvis. No evidence abscess in the abdomen pelvis. Musculoskeletal: No aggressive osseous lesion. IMPRESSION: Chest Impression: 1. Bilateral moderate-sized pleural effusions. 2. Patchy airspace disease in lower lobes representing pneumonia or aspiration pneumonitis. No large consolidation. 3. Mild interstitial edema. 4. Endotracheal tube appears in good position. Abdomen / Pelvis Impression: 1. Anasarca of the soft tissues. 2. Small amount intraperitoneal fluid. 3. No evidence pelvic  abscess.  No bowel obstruction. 4. Feeding tube with tip gastric antrum. 5. No hydronephrosis.  Foley catheter in bladder. Electronically Signed   By: Suzy Bouchard M.D.   On: 04/26/2017 17:07   Ct Chest Wo Contrast  Result  Date: 04/26/2017 CLINICAL DATA:  Septic shock. EXAM: CT CHEST, ABDOMEN AND PELVIS WITHOUT CONTRAST TECHNIQUE: Multidetector CT imaging of the chest, abdomen and pelvis was performed following the standard protocol without IV contrast. COMPARISON:  CT abdomen 04/20/2017 FINDINGS: CT CHEST FINDINGS Cardiovascular: Coronary artery calcification and aortic atherosclerotic calcification. Mediastinum/Nodes: No axillary or supraclavicular adenopathy. Small paratracheal and prevascular lymph nodes. No pericardial fluid. NG tube extends into the stomach. Endotracheal to extends into the trachea several cm from carina. Lungs/Pleura: Bilateral moderate size pleural effusions. There is bibasilar patchy airspace disease. No large consolidation. Mild interlobular septal thickening Musculoskeletal: No acute osseous abnormality. CT ABDOMEN AND PELVIS FINDINGS Hepatobiliary: Nonionic contrast imaging demonstrates no focal hepatic lesion. Gallbladder normal. Pancreas: Pancreas is normal. No ductal dilatation. No pancreatic inflammation. Spleen: Normal spleen Adrenals/urinary tract: Adrenal glands are normal. No hydronephrosis. Foley catheter within the bladder. Stomach/Bowel: Feeding tube extends the gastric antrum. No bowel obstruction. No bowel inflammation evident. Appendix is normal. Colon and rectosigmoid colon are normal. Vascular/Lymphatic: Abdominal aorta is normal caliber with atherosclerotic calcification. There is no retroperitoneal or periportal lymphadenopathy. No pelvic lymphadenopathy. Reproductive: Uterus and adnexa normal. Other: Small amount free fluid the pelvis. Significant anasarca of the soft tissues the abdomen pelvis. No evidence abscess in the abdomen pelvis. Musculoskeletal: No aggressive osseous lesion. IMPRESSION: Chest Impression: 1. Bilateral moderate-sized pleural effusions. 2. Patchy airspace disease in lower lobes representing pneumonia or aspiration pneumonitis. No large consolidation. 3. Mild  interstitial edema. 4. Endotracheal tube appears in good position. Abdomen / Pelvis Impression: 1. Anasarca of the soft tissues. 2. Small amount intraperitoneal fluid. 3. No evidence pelvic abscess.  No bowel obstruction. 4. Feeding tube with tip gastric antrum. 5. No hydronephrosis.  Foley catheter in bladder. Electronically Signed   By: Suzy Bouchard M.D.   On: 04/26/2017 17:07   Portable Chest Xray  Result Date: 04/27/2017 CLINICAL DATA:  Smoker. Respiratory failure; on vent. EXAM: PORTABLE CHEST 1 VIEW COMPARISON:  04/26/2017 FINDINGS: Irregular interstitial thickening noted on the prior study has improved. There is less opacity at the lung bases. There still mild bilateral interstitial thickening associated with prominent bronchovascular markings. Lungs are hyperexpanded. Residual hazy lung base opacity is most noted on the left consistent with a small pleural effusion. No pneumothorax. The endotracheal tube, nasogastric tube and left internal jugular central venous line are stable and well positioned. IMPRESSION: 1. Improved lung aeration when compared to the previous day's study. This is most consistent with improved interstitial pulmonary edema and decreased pleural effusions. No new abnormalities. 2. Support apparatus is stable and well positioned. Electronically Signed   By: Lajean Manes M.D.   On: 04/27/2017 07:36   Dg Chest Port 1 View  Result Date: 04/26/2017 CLINICAL DATA:  Endotracheal tube repositioning. EXAM: PORTABLE CHEST 1 VIEW COMPARISON:  Radiograph of same day. FINDINGS: Stable cardiomediastinal silhouette. Nasogastric tube is seen entering the stomach. Left internal jugular catheter is unchanged with distal tip in expected position of the SVC. Endotracheal tube has been partially withdrawn with distal tip 2 cm above the carina. Stable bilateral perihilar and basilar interstitial densities are noted concerning for edema with mild associated pleural effusions. Bony thorax  unremarkable. IMPRESSION: Endotracheal tube is in grossly good position at this time. Stable bilateral  lung opacities are noted consistent with edema and possible effusions. Electronically Signed   By: Marijo Conception, M.D.   On: 04/26/2017 10:27   BMET    Component Value Date/Time   NA 135 04/28/2017 0357   K 3.0 (L) 04/28/2017 0357   CL 95 (L) 04/28/2017 0357   CO2 28 04/28/2017 0357   GLUCOSE 93 04/28/2017 0357   BUN 30 (H) 04/28/2017 0357   CREATININE 1.62 (H) 04/28/2017 0357   CALCIUM 6.5 (L) 04/28/2017 0357   GFRNONAA 30 (L) 04/28/2017 0357   GFRAA 35 (L) 04/28/2017 0357   CBC    Component Value Date/Time   WBC 22.7 (H) 04/28/2017 0357   RBC 3.28 (L) 04/28/2017 0357   HGB 9.3 (L) 04/28/2017 0357   HCT 28.1 (L) 04/28/2017 0357   PLT 550 (H) 04/28/2017 0357   MCV 85.7 04/28/2017 0357   MCH 28.4 04/28/2017 0357   MCHC 33.1 04/28/2017 0357   RDW 19.5 (H) 04/28/2017 0357   LYMPHSABS 5.0 (H) 04/20/2017 1050   MONOABS 1.1 (H) 04/20/2017 1050   EOSABS 0.0 04/20/2017 1050   BASOSABS 0.0 04/20/2017 1050     Assessment: 1. ARF sec hypotension in setting of bradycardia and sepsis syndrome (baseline Scr 1.0 04/14/17) 2. VDRF 3. Junctional bradycardia 4. Hx COPD 5. Met acidosis, improved 6. Hypocalcemia 7. hypokalemia   Plan: 1.  UO excellent and Scr trending down.  I am adding little at this time and will sign off.  Call if further renal issues. 2.  Redose Ca gluconate 3.  K and Mag already being replaced   Ladarren Steiner T

## 2017-04-28 NOTE — Progress Notes (Signed)
Progress Note  Patient Name: Alexandra House Date of Encounter: 04/28/2017  Primary Cardiologist: Dr. Stanford Breed  Subjective   Intubated and sedated. Unable to assess.  Inpatient Medications    Scheduled Meds: . arformoterol  15 mcg Nebulization BID  . aspirin  325 mg Per Tube Daily  . budesonide (PULMICORT) nebulizer solution  0.5 mg Nebulization BID  . chlorhexidine gluconate (MEDLINE KIT)  15 mL Mouth Rinse BID  . Chlorhexidine Gluconate Cloth  6 each Topical Daily  . fentaNYL (SUBLIMAZE) injection  50 mcg Intravenous Once  . fentaNYL (SUBLIMAZE) injection  50 mcg Intravenous Once  . furosemide  80 mg Intravenous Daily  . guaiFENesin  600 mg Oral BID  . heparin subcutaneous  5,000 Units Subcutaneous Q8H  . hydrocortisone sod succinate (SOLU-CORTEF) inj  50 mg Intravenous Q6H  . insulin aspart  1-3 Units Subcutaneous Q4H  . mouth rinse  15 mL Mouth Rinse QID  . pantoprazole (PROTONIX) IV  40 mg Intravenous Q12H  . sodium chloride flush  10-40 mL Intracatheter Q12H  . sodium chloride flush  3 mL Intravenous Q12H  . vancomycin  500 mg Per Tube Q6H   Continuous Infusions: . sodium chloride 10 mL/hr at 04/27/17 0400  . sodium chloride    . calcium gluconate 1 g (04/28/17 1050)  . DOBUTamine Stopped (04/26/17 1817)  . feeding supplement (VITAL AF 1.2 CAL) Stopped (04/25/17 1111)  . fentaNYL infusion INTRAVENOUS 125 mcg/hr (04/28/17 0800)  . linezolid (ZYVOX) IV Stopped (04/28/17 1116)  . meropenem (MERREM) IV Stopped (04/28/17 1020)  . norepinephrine (LEVOPHED) Adult infusion Stopped (04/28/17 0000)  . potassium chloride 10 mEq (04/28/17 1050)   PRN Meds: Place/Maintain arterial line **AND** sodium chloride, acetaminophen (TYLENOL) oral liquid 160 mg/5 mL, fentaNYL, ipratropium-albuterol, midazolam, midazolam, ondansetron **OR** ondansetron (ZOFRAN) IV, sodium chloride flush   Vital Signs    Vitals:   04/28/17 0943 04/28/17 0944 04/28/17 1000 04/28/17 1100  BP:  (!)  115/59    Pulse:  81 81 61  Resp:  17 (!) 22 17  Temp:      TempSrc:      SpO2: 100% 100% 100% 100%  Weight:      Height:        Intake/Output Summary (Last 24 hours) at 04/28/17 1111 Last data filed at 04/28/17 1100  Gross per 24 hour  Intake          1653.71 ml  Output             3230 ml  Net         -1576.29 ml   Filed Weights   04/26/17 0300 04/27/17 0500 04/28/17 0400  Weight: 53.8 kg (118 lb 9.7 oz) 53.7 kg (118 lb 6.2 oz) 50.7 kg (111 lb 12.4 oz)    Telemetry    Sinus rhythm.  Frequent PACs. - Personally Reviewed  ECG    03/27/18: Junctional rhythm.  Rate 45 bpm. Non-specific ST changes and TWI.  QTc 505 ms.  - Personally Reviewed  Physical Exam   GEN: Intubated.  Sedated.  Appears comfortable. Neck: No JVD Cardiac: RRR, II/VI systolic murmur at apex.  No rubs or gallops.  Respiratory: Vented breath sounds anteriorly.  No crackles, wheezes or rhonchi GI: Soft, nontender, non-distended  MS: WWP.  1+ pitting edema to upper tibia bilaterally; No deformity. Neuro:  Nonfocal  Psych: Normal affect   Labs    Chemistry Recent Labs Lab 04/21/17 1357  04/23/17 1046  04/26/17 0217 04/27/17  5732 04/28/17 0357  NA 144  < >  --   < > 138 137 135  K 3.1*  < >  --   < > 5.3* 3.3* 3.0*  CL 108  < >  --   < > 110 97* 95*  CO2 18*  < >  --   < > 13* 30 28  GLUCOSE 163*  < >  --   < > 91 138* 93  BUN 32*  < >  --   < > 48* 40* 30*  CREATININE 2.55*  < >  --   < > 1.93* 1.73* 1.62*  CALCIUM 5.3*  < >  --   < > 6.7* 6.3* 6.5*  PROT 4.6*  --  4.8*  --   --  5.2*  --   ALBUMIN 1.8*  --  1.7*  --   --  1.8* 1.9*  AST 195*  --  70*  --   --  510*  --   ALT 71*  --  76*  --   --  261*  --   ALKPHOS 142*  --  155*  --   --  256*  --   BILITOT 0.5  --  0.2*  --   --  0.7  --   GFRNONAA 18*  < >  --   < > 25* 28* 30*  GFRAA 20*  < >  --   < > 29* 33* 35*  ANIONGAP 18*  < >  --   < > '15 10 12  ' < > = values in this interval not displayed.   Hematology  Recent Labs Lab  04/26/17 0217 04/27/17 0313 04/28/17 0357  WBC 38.3* 37.0* 22.7*  RBC 3.10* 3.16* 3.28*  HGB 8.5* 8.8* 9.3*  HCT 27.4* 27.4* 28.1*  MCV 88.4 86.7 85.7  MCH 27.4 27.8 28.4  MCHC 31.0 32.1 33.1  RDW 16.8* 17.4* 19.5*  PLT 564* 556* 550*    Cardiac Enzymes  Recent Labs Lab 04/24/17 0330 04/25/17 0800 04/26/17 0507 04/26/17 0842  TROPONINI 3.61* 3.45* 2.87* 2.28*   No results for input(s): TROPIPOC in the last 168 hours.   BNP  Recent Labs Lab 04/21/17 1357  BNP 1,528.1*     DDimer No results for input(s): DDIMER in the last 168 hours.   Radiology    Ct Abdomen Pelvis Wo Contrast  Result Date: 04/26/2017 CLINICAL DATA:  Septic shock. EXAM: CT CHEST, ABDOMEN AND PELVIS WITHOUT CONTRAST TECHNIQUE: Multidetector CT imaging of the chest, abdomen and pelvis was performed following the standard protocol without IV contrast. COMPARISON:  CT abdomen 04/20/2017 FINDINGS: CT CHEST FINDINGS Cardiovascular: Coronary artery calcification and aortic atherosclerotic calcification. Mediastinum/Nodes: No axillary or supraclavicular adenopathy. Small paratracheal and prevascular lymph nodes. No pericardial fluid. NG tube extends into the stomach. Endotracheal to extends into the trachea several cm from carina. Lungs/Pleura: Bilateral moderate size pleural effusions. There is bibasilar patchy airspace disease. No large consolidation. Mild interlobular septal thickening Musculoskeletal: No acute osseous abnormality. CT ABDOMEN AND PELVIS FINDINGS Hepatobiliary: Nonionic contrast imaging demonstrates no focal hepatic lesion. Gallbladder normal. Pancreas: Pancreas is normal. No ductal dilatation. No pancreatic inflammation. Spleen: Normal spleen Adrenals/urinary tract: Adrenal glands are normal. No hydronephrosis. Foley catheter within the bladder. Stomach/Bowel: Feeding tube extends the gastric antrum. No bowel obstruction. No bowel inflammation evident. Appendix is normal. Colon and rectosigmoid  colon are normal. Vascular/Lymphatic: Abdominal aorta is normal caliber with atherosclerotic calcification. There is no retroperitoneal or periportal lymphadenopathy.  No pelvic lymphadenopathy. Reproductive: Uterus and adnexa normal. Other: Small amount free fluid the pelvis. Significant anasarca of the soft tissues the abdomen pelvis. No evidence abscess in the abdomen pelvis. Musculoskeletal: No aggressive osseous lesion. IMPRESSION: Chest Impression: 1. Bilateral moderate-sized pleural effusions. 2. Patchy airspace disease in lower lobes representing pneumonia or aspiration pneumonitis. No large consolidation. 3. Mild interstitial edema. 4. Endotracheal tube appears in good position. Abdomen / Pelvis Impression: 1. Anasarca of the soft tissues. 2. Small amount intraperitoneal fluid. 3. No evidence pelvic abscess.  No bowel obstruction. 4. Feeding tube with tip gastric antrum. 5. No hydronephrosis.  Foley catheter in bladder. Electronically Signed   By: Suzy Bouchard M.D.   On: 04/26/2017 17:07   Ct Chest Wo Contrast  Result Date: 04/26/2017 CLINICAL DATA:  Septic shock. EXAM: CT CHEST, ABDOMEN AND PELVIS WITHOUT CONTRAST TECHNIQUE: Multidetector CT imaging of the chest, abdomen and pelvis was performed following the standard protocol without IV contrast. COMPARISON:  CT abdomen 04/20/2017 FINDINGS: CT CHEST FINDINGS Cardiovascular: Coronary artery calcification and aortic atherosclerotic calcification. Mediastinum/Nodes: No axillary or supraclavicular adenopathy. Small paratracheal and prevascular lymph nodes. No pericardial fluid. NG tube extends into the stomach. Endotracheal to extends into the trachea several cm from carina. Lungs/Pleura: Bilateral moderate size pleural effusions. There is bibasilar patchy airspace disease. No large consolidation. Mild interlobular septal thickening Musculoskeletal: No acute osseous abnormality. CT ABDOMEN AND PELVIS FINDINGS Hepatobiliary: Nonionic contrast imaging  demonstrates no focal hepatic lesion. Gallbladder normal. Pancreas: Pancreas is normal. No ductal dilatation. No pancreatic inflammation. Spleen: Normal spleen Adrenals/urinary tract: Adrenal glands are normal. No hydronephrosis. Foley catheter within the bladder. Stomach/Bowel: Feeding tube extends the gastric antrum. No bowel obstruction. No bowel inflammation evident. Appendix is normal. Colon and rectosigmoid colon are normal. Vascular/Lymphatic: Abdominal aorta is normal caliber with atherosclerotic calcification. There is no retroperitoneal or periportal lymphadenopathy. No pelvic lymphadenopathy. Reproductive: Uterus and adnexa normal. Other: Small amount free fluid the pelvis. Significant anasarca of the soft tissues the abdomen pelvis. No evidence abscess in the abdomen pelvis. Musculoskeletal: No aggressive osseous lesion. IMPRESSION: Chest Impression: 1. Bilateral moderate-sized pleural effusions. 2. Patchy airspace disease in lower lobes representing pneumonia or aspiration pneumonitis. No large consolidation. 3. Mild interstitial edema. 4. Endotracheal tube appears in good position. Abdomen / Pelvis Impression: 1. Anasarca of the soft tissues. 2. Small amount intraperitoneal fluid. 3. No evidence pelvic abscess.  No bowel obstruction. 4. Feeding tube with tip gastric antrum. 5. No hydronephrosis.  Foley catheter in bladder. Electronically Signed   By: Suzy Bouchard M.D.   On: 04/26/2017 17:07   Dg Chest Port 1 View  Result Date: 04/28/2017 CLINICAL DATA:  ET tube, acute respiratory failure EXAM: PORTABLE CHEST 1 VIEW COMPARISON:  04/27/2017 FINDINGS: Endotracheal tube is 4 cm above the carina, stable. Remainder of the support devices are stable. Biapical pleural thickening/ scarring. Left lower lobe atelectasis or infiltrate with small left effusion. Heart is upper limits normal in size. Mild interstitial prominence is stable. IMPRESSION: No significant change since prior study. Electronically  Signed   By: Rolm Baptise M.D.   On: 04/28/2017 07:41   Portable Chest Xray  Result Date: 04/27/2017 CLINICAL DATA:  Smoker. Respiratory failure; on vent. EXAM: PORTABLE CHEST 1 VIEW COMPARISON:  04/26/2017 FINDINGS: Irregular interstitial thickening noted on the prior study has improved. There is less opacity at the lung bases. There still mild bilateral interstitial thickening associated with prominent bronchovascular markings. Lungs are hyperexpanded. Residual hazy lung base opacity is most noted  on the left consistent with a small pleural effusion. No pneumothorax. The endotracheal tube, nasogastric tube and left internal jugular central venous line are stable and well positioned. IMPRESSION: 1. Improved lung aeration when compared to the previous day's study. This is most consistent with improved interstitial pulmonary edema and decreased pleural effusions. No new abnormalities. 2. Support apparatus is stable and well positioned. Electronically Signed   By: Lajean Manes M.D.   On: 04/27/2017 07:36    Cardiac Studies   Echo 04/21/17: Study Conclusions  - Left ventricle: The cavity size was normal. Wall thickness was   normal. Systolic function was mildly reduced. The estimated   ejection fraction was 45%. - Regional wall motion abnormality: Hypokinesis of the basal-mid   anteroseptal, basal-mid inferoseptal, basal-mid inferior, and   apical septal myocardium. - Mitral valve: There was moderate regurgitation. - Right ventricle: Systolic function was mildly reduced. - Tricuspid valve: There was mild regurgitation.  Patient Profile     71 y.o. 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  Assessment & Plan    # Sinus bradycardia:   # Junctional rhythm:  Resolved.  Heart rhythm has been stable.  She had a run of frequent  PACs this am while bathing.  She was previoiusly bradycardic to the 30s on admission and required external pacing.  She responded well to dobutamine.  Dobutamine has been weaned and her heart rates remain in the stable.  Holding nodal agents  # Chronic systolic and diastolic heart failure: Ms. Klimaszewski is now off pressors. She is tolerating lasix.  Renal function stable.  Holding beta blocker as above.   # Elevated troponin: Likely demand ischemia.  Troponin peaked at 4.39.  She denies chest pain.  Ischemia evaluation when able.  Continue aspirin.    # AKI: Renal function improving.  Creatinine was 1.02 04/07/17 and up to 3.09 this admission. Watch closely with diuresis.  Time spent: 60 minutes-Greater than 50% of this time was spent in counseling, explanation of diagnosis, planning of further management, and coordination of care.   Signed, Skeet Latch, MD  04/28/2017, 11:11 AM

## 2017-04-28 NOTE — Progress Notes (Signed)
Hypoglycemic Event  CBG: 28  Treatment: D50 IV 50 mL  Symptoms: None  Follow-up CBG: Time:268 CBG Result:1945  Possible Reasons for Event: Unknown  Comments/MD notified:Dr Tamala Julian :continue to monitor    Felizardo Hoffmann

## 2017-04-29 ENCOUNTER — Inpatient Hospital Stay (HOSPITAL_COMMUNITY): Payer: Medicare HMO

## 2017-04-29 LAB — ECHOCARDIOGRAM LIMITED
HEIGHTINCHES: 58 in
Weight: 1894.19 oz

## 2017-04-29 LAB — CBC
HCT: 30.5 % — ABNORMAL LOW (ref 36.0–46.0)
HEMOGLOBIN: 9.7 g/dL — AB (ref 12.0–15.0)
MCH: 28 pg (ref 26.0–34.0)
MCHC: 31.8 g/dL (ref 30.0–36.0)
MCV: 88.2 fL (ref 78.0–100.0)
PLATELETS: 499 10*3/uL — AB (ref 150–400)
RBC: 3.46 MIL/uL — AB (ref 3.87–5.11)
RDW: 20.5 % — ABNORMAL HIGH (ref 11.5–15.5)
WBC: 14.1 10*3/uL — AB (ref 4.0–10.5)

## 2017-04-29 LAB — BASIC METABOLIC PANEL
ANION GAP: 12 (ref 5–15)
BUN: 28 mg/dL — AB (ref 6–20)
CHLORIDE: 93 mmol/L — AB (ref 101–111)
CO2: 28 mmol/L (ref 22–32)
Calcium: 6.3 mg/dL — CL (ref 8.9–10.3)
Creatinine, Ser: 1.5 mg/dL — ABNORMAL HIGH (ref 0.44–1.00)
GFR, EST AFRICAN AMERICAN: 39 mL/min — AB (ref >60)
GFR, EST NON AFRICAN AMERICAN: 33 mL/min — AB (ref >60)
Glucose, Bld: 100 mg/dL — ABNORMAL HIGH (ref 65–99)
POTASSIUM: 3.5 mmol/L (ref 3.5–5.1)
SODIUM: 133 mmol/L — AB (ref 135–145)

## 2017-04-29 LAB — GLUCOSE, CAPILLARY
GLUCOSE-CAPILLARY: 101 mg/dL — AB (ref 65–99)
GLUCOSE-CAPILLARY: 92 mg/dL (ref 65–99)
GLUCOSE-CAPILLARY: 107 mg/dL — AB (ref 65–99)
Glucose-Capillary: 118 mg/dL — ABNORMAL HIGH (ref 65–99)
Glucose-Capillary: 106 mg/dL — ABNORMAL HIGH (ref 65–99)
Glucose-Capillary: 101 mg/dL — ABNORMAL HIGH (ref 65–99)

## 2017-04-29 LAB — PROCALCITONIN: Procalcitonin: 0.48 ng/mL

## 2017-04-29 LAB — PHOSPHORUS: PHOSPHORUS: 3.5 mg/dL (ref 2.5–4.6)

## 2017-04-29 LAB — MAGNESIUM: MAGNESIUM: 2.3 mg/dL (ref 1.7–2.4)

## 2017-04-29 MED ORDER — ORAL CARE MOUTH RINSE
15.0000 mL | Freq: Two times a day (BID) | OROMUCOSAL | Status: DC
  Administered 2017-04-30 – 2017-05-08 (×15): 15 mL via OROMUCOSAL

## 2017-04-29 MED ORDER — HYDROCORTISONE NA SUCCINATE PF 100 MG IJ SOLR
50.0000 mg | Freq: Two times a day (BID) | INTRAMUSCULAR | Status: DC
  Administered 2017-04-29 – 2017-04-30 (×2): 50 mg via INTRAVENOUS
  Filled 2017-04-29: qty 1

## 2017-04-29 MED ORDER — CHLORHEXIDINE GLUCONATE 0.12 % MT SOLN
15.0000 mL | Freq: Two times a day (BID) | OROMUCOSAL | Status: DC
  Administered 2017-04-29 – 2017-05-09 (×15): 15 mL via OROMUCOSAL
  Filled 2017-04-29 (×15): qty 15

## 2017-04-29 NOTE — Progress Notes (Signed)
PULMONARY / CRITICAL CARE MEDICINE   Name: Alexandra House MRN: [id number] DOB: 1946-01-26    ADMISSION DATE:04/20/2017 REFERRING MD:Dr. Aileen Fass   CHIEF COMPLAINT:AMS, Hypotension, Hypothermia, Bradycardia   HISTORY OF PRESENT ILLNESS: [name] who initially presented with bradycardia in the 30's, hypotension (79/39), tachypnea, hypothermic. She was treated with IVF with improvement in BP. The patient was admitted per [facility] and treated for suspected sepsis. She developed worsening hypotension, bradycardia, altered mental status and increased work of breathing on 7/8. PCCM called back for re-evaluation. Extubated 7/12, became anxious overnight and reintubated 7/13 due to tachypneic.  Additionally, bradycardic to 30s started on external pacing and restarted on dobutamine and lovephed. Indeterminate c. Diff, no PCR sent. Off pressors with appropriate MAP.   SUBJECTIVE: Off pressors today, ETT in place Hypoglycemic to 23 overnight, no tube feeds  Urine output is picking up  VITAL SIGNS: BP 123/68 (BP Location: Right Arm)   Pulse 82   Temp 97.8 F (36.6 C) (Oral)   Resp 16   Ht '4\' 10"'  (1.473 m)   Wt 112 lb 14 oz (51.2 kg)   SpO2 99%   BMI 23.59 kg/m   HEMODYNAMICS:    VENTILATOR SETTINGS: Vent Mode: PRVC FiO2 (%):  [30 %-40 %] 30 % Set Rate:  [16 bmp] 16 bmp Vt Set:  [360 mL] 360 mL PEEP:  [5 cmH20] 5 cmH20 Plateau Pressure:  [14 cmH20-17 cmH20] 17 cmH20  INTAKE / OUTPUT: I/O last 3 completed shifts: In: 2354.9 [I.V.:704.9; NG/GT:100; IV Piggyback:1550] Out: 2980 [Urine:2830; Emesis/NG output:150]  PHYSICAL EXAMINATION: General: [name] lying in bed comfortable and in NAD Neuro: alert, attempting to speak and following commands, moves all extremities HEENT:  PERRL, MMM Cardiovascular:  RRR, no MRG Lungs:  CTABL Abdomen:  +BS, soft, NTND Musculoskeletal:  No gross deformities Ext: edema in upper and lower extremities   LABS:  BMET  Recent Labs Lab  04/28/17 0357 04/28/17 2110 04/29/17 0410  NA 135 132* 133*  K 3.0* 3.5 3.5  CL 95* 95* 93*  CO2 '28 29 28  ' BUN 30* 32* 28*  CREATININE 1.62* 1.58* 1.50*  GLUCOSE 93 95 100*    Electrolytes  Recent Labs Lab 04/28/17 0357 04/28/17 2110 04/29/17 0410  CALCIUM 6.5* 6.5* 6.3*  MG 1.5* 2.5* 2.3  PHOS 3.0 3.2 3.5    CBC  Recent Labs Lab 04/27/17 0313 04/28/17 0357 04/29/17 0410  WBC 37.0* 22.7* 14.1*  HGB 8.8* 9.3* 9.7*  HCT 27.4* 28.1* 30.5*  PLT 556* 550* 499*    Coag's  Recent Labs Lab 04/23/17 0745  APTT 68*    Sepsis Markers  Recent Labs Lab 04/23/17 1046  04/25/17 1157 04/26/17 0230 04/26/17 0657 04/26/17 1539  LATICACIDVEN  --   < >  --  6.0* 7.6* 2.5*  PROCALCITON 0.72  --  0.68  --   --   --   < > = values in this interval not displayed.  ABG  Recent Labs Lab 04/26/17 0206 04/26/17 0640 04/26/17 0820  PHART 7.352 7.231* 7.333*  PCO2ART 18.6* 24.4* 25.1*  PO2ART 109.0* 97.4 126*    Liver Enzymes  Recent Labs Lab 04/23/17 1046 04/27/17 0313 04/28/17 0357  AST 70* 510*  --   ALT 76* 261*  --   ALKPHOS 155* 256*  --   BILITOT 0.2* 0.7  --   ALBUMIN 1.7* 1.8* 1.9*    Cardiac Enzymes  Recent Labs Lab 04/25/17 0800 04/26/17 0507 04/26/17 0842  TROPONINI 3.45*  2.87* 2.28*    Glucose  Recent Labs Lab 04/28/17 1513 04/28/17 1928 04/28/17 1945 04/28/17 2346 04/29/17 0405 04/29/17 0724  GLUCAP 71 28* 268* 118* 101* 92    Imaging Dg Chest Port 1 View  Result Date: 04/29/2017 CLINICAL DATA:  Respiratory failure. EXAM: PORTABLE CHEST 1 VIEW COMPARISON:  04/28/2017. FINDINGS: Endotracheal tube, NG tube, left IJ line in stable position. Heart size stable. Mild bilateral pulmonary infiltrates/edema and small bilateral pleural effusions noted suggesting mild CHF. No pneumothorax . Stable position. Heart size normal. Diffuse bilateral pulmonary infiltrates IMPRESSION: 1. Lines and tubes in stable position. 2. Diffuse  bilateral pulmonary infiltrates and small bilateral pleural effusions suggesting mild CHF. Electronically Signed   By: Marcello Moores  Register   On: 04/29/2017 06:39     STUDIES: 7/05 [place] ABD >>abdominal US on 7/5 which was negative for gallstones, hepatic abnormality, normal pancreas &no hydronephrosis.  7/7 CT chest/abdomen/pelvis> emphysema, mult-lobar pneumonia, fatty liver, fat stranding around head of pancreas 7/13 CT chest/abdomen/pelvis> Moderate B/L effusions, patchy air space disease at bases. Anasarca 7/16 CXR> Diffuse bilateral pulmonary infiltrates and small bilateral pleural effusions suggesting mild CHF.  CULTURES: BCx2 7/7>> final negative UC 7/7 >>final negative BCX 7/10>>  NGTD x2 days resp culture 7/10 >> few candida albicans BCX 7/13>> Ucx 7/13>> Sputum cx>>  ANTIBIOTICS: Zosyn 7/7 >>7/13 Vanco 7/7 >>7/10 Vancomycin PO 7/13>> Zyvox 7/14 > Meropenem 7/14>  SIGNIFICANT EVENTS: 7/07 Admit with N/V, weakness, hypotension  7/8 >> intubated 7/12>> extubated 7/13>> intubated  LINES/TUBES: ETT 7/8 >>7/12 ETT 7/13>> CVC LIJ 7/8 >> Urethral cath 7/8>> NGT 7/8>>  DISCUSSION: 71 y/o female septic shock, aspiration and elevated troponin likely 2/2 demand ischemia vs: NSTEMI. Resp distress 7/12 s/p extubation, now reintubated. Continued significant leukocytosis with watery stools. Repeating blood, urine, sputum cultures and empirically treating for c. Diff and continuing meropenem and linezolid  ASSESSMENT / PLAN:  PULMONARY A: Acute Respiratory Failure Centrilobular Emphysema  Aspiration PNA, completed course of  B/L effusions. Low suspicion for empyema On [place] they are free flowing with no loculations.  P: Extubate today Brovana/pulmicort, mucinex Continue duobnebs  CARDIOVASCULAR A:  Bradycardia, with junctional escape rhythm, stable Elevated Troponin - suspect demand ischemia Hx Grade II Diastolic Dysfunction, LVEF Hx Carotid Stenosis   Shock: presumably septic vs cardiogenic P: Follow repeat echo (not yet performed) Cont ASA Cardiology following; appreciate assistance  RENAL A:  AKI Hypokalemia, resolved Hypocalcemia: 6.3 P: Continue lasix 71m IV daily Continue to monitor UOP  GASTROINTESTINAL A:  Nausea / Vomiting, Weight Loss - ? If related to Prolia  Elevated alk phos without evidence of biliary obstruction Indeterminate C. diff P: Bedside swallow zofran PRN nausea  HEMATOLOGIC A:  Anemia, stable. hgb to 9.7 P: Monitor for bleeding Transfusion threshold <8 Lovenox  INFECTIOUS A:  Aspiration pneumonia On empiric treatment for C diff, improving P: Meropenem, linezolid and PO vanc  ENDOCRINE A:  Hyperglycemia  P: SSI  NEUROLOGIC A:  Mild confusion/acute encephalopathy due to sepsis/shock P: Extubate today ativan 175mBID PRN   FAMILY - Updates: Updated husband and son at bedside at length/ 7/15 - Inter-disciplinary family meet or Palliative Care meeting due by: day 7    Pulmonary and CrLoughmanager: ([phone]/16/2018, 7:45 AM

## 2017-04-29 NOTE — Progress Notes (Signed)
Wasted 50cc fentanyl in sink with Heide Guile RN

## 2017-04-29 NOTE — Procedures (Signed)
Extubation Procedure Note  Patient Details:   Name: Alexandra House DOB: November 23, 1945 MRN: [id number]   Airway Documentation:     Evaluation  O2 sats: stable throughout Complications: No apparent complications Patient did tolerate procedure well. Bilateral Breath Sounds: Clear   Yes, Placed on 4LNC   SPO2 100%  Gonzella Lex 04/29/2017, 12:23 PM

## 2017-04-29 NOTE — Progress Notes (Signed)
Nutrition Follow-up  DOCUMENTATION CODES:   Non-severe (moderate) malnutrition in context of chronic illness  INTERVENTION:    Diet advancement as able.   RD to supplement diet as needed.  NUTRITION DIAGNOSIS:   Malnutrition (moderate) related to chronic illness (smoker, neuropathy, osteoporosis, CAD) as evidenced by mild depletion of body fat, mild depletion of muscle mass, percent weight loss (12% weight loss within one month).  Ongoing  GOAL:   Patient will meet greater than or equal to 90% of their needs  Unmet  MONITOR:   Diet advancement, PO intake, Labs, I & O's  ASSESSMENT:   71 yo female with PMH of fibromyalgia, HLD, neuropathy, osteoporosis, anxiety, carotid stenosis, who was admitted on 7/7 with progressive metabolic acidosis, acute respiratory failure, requiring intubation.  Discussed patient in ICU rounds and with RN today. Patient was extubated this morning.  TF had been on hold due to nausea. Now off since extubation. Labs and medications reviewed.  Diet Order:   NPO  Skin:  Reviewed, no issues  Last BM:  7/14  Height:   Ht Readings from Last 1 Encounters:  04/22/17 4\' 10"  (1.473 m)    Weight:   Wt Readings from Last 1 Encounters:  04/29/17 112 lb 14 oz (51.2 kg)    Ideal Body Weight:  43.9 kg  BMI:  Body mass index is 23.59 kg/m.  Estimated Nutritional Needs:   Kcal:  1300-1500  Protein:  65-75 gm  Fluid:  1.3-1.5 L  EDUCATION NEEDS:   No education needs identified at this time  Molli Barrows, [place], [place], [place] Pager [phone] After Hours Pager [phone]

## 2017-04-29 NOTE — Progress Notes (Signed)
Progress Note  Patient Name: Alexandra House Date of Encounter: 04/29/2017  Primary Cardiologist: Stanford Breed  Subjective   On Vent but shakes head appropriately to questions.  No pain and no SOB.    Inpatient Medications    Scheduled Meds: . arformoterol  15 mcg Nebulization BID  . aspirin  325 mg Per Tube Daily  . budesonide (PULMICORT) nebulizer solution  0.5 mg Nebulization BID  . chlorhexidine gluconate (MEDLINE KIT)  15 mL Mouth Rinse BID  . Chlorhexidine Gluconate Cloth  6 each Topical Daily  . fentaNYL (SUBLIMAZE) injection  50 mcg Intravenous Once  . fentaNYL (SUBLIMAZE) injection  50 mcg Intravenous Once  . furosemide  80 mg Intravenous Daily  . guaiFENesin  600 mg Oral BID  . heparin subcutaneous  5,000 Units Subcutaneous Q8H  . hydrocortisone sod succinate (SOLU-CORTEF) inj  50 mg Intravenous Q6H  . insulin aspart  1-3 Units Subcutaneous Q4H  . mouth rinse  15 mL Mouth Rinse QID  . pantoprazole (PROTONIX) IV  40 mg Intravenous Q12H  . sodium chloride flush  10-40 mL Intracatheter Q12H  . vancomycin  500 mg Oral Q6H   Continuous Infusions: . sodium chloride 10 mL/hr at 04/29/17 0054  . sodium chloride    . feeding supplement (VITAL AF 1.2 CAL) Stopped (04/25/17 1111)  . fentaNYL infusion INTRAVENOUS 75 mcg/hr (04/29/17 0815)  . linezolid (ZYVOX) IV 600 mg (04/29/17 0948)  . meropenem (MERREM) IV Stopped (04/29/17 0936)   PRN Meds: Place/Maintain arterial line **AND** sodium chloride, acetaminophen (TYLENOL) oral liquid 160 mg/5 mL, fentaNYL, ipratropium-albuterol, midazolam, midazolam, ondansetron **OR** ondansetron (ZOFRAN) IV, sodium chloride flush   Vital Signs    Vitals:   04/29/17 0744 04/29/17 0800 04/29/17 0843 04/29/17 0900  BP:  109/73  119/71  Pulse:  82  81  Resp:  15  12  Temp:      TempSrc:      SpO2: 99% 99% 99% 100%  Weight:      Height:        Intake/Output Summary (Last 24 hours) at 04/29/17 0952 Last data filed at 04/29/17 0900  Gross per 24 hour  Intake          1423.75 ml  Output             1920 ml  Net          -496.25 ml   Filed Weights   04/27/17 0500 04/28/17 0400 04/29/17 0400  Weight: 118 lb 6.2 oz (53.7 kg) 111 lb 12.4 oz (50.7 kg) 112 lb 14 oz (51.2 kg)    Telemetry    SR with PACs and non conducted PACs some Junctional with hypoglycemia  - Personally Reviewed  ECG    No new since the 13th. - Personally Reviewed  Physical Exam   GEN: Intubated.  Neck: No JVD Cardiac: RRR, no murmurs, rubs, or gallops.  Respiratory: Clear to auscultation bilaterally from ant position. GI: Soft, nontender, non-distended  MS: No pitting edema; No deformity. Neuro:  Nonfocal  Psych: Normal affect   Labs    Chemistry Recent Labs Lab 04/23/17 1046  04/27/17 0313 04/28/17 0357 04/28/17 2110 04/29/17 0410  NA  --   < > 137 135 132* 133*  K  --   < > 3.3* 3.0* 3.5 3.5  CL  --   < > 97* 95* 95* 93*  CO2  --   < > '30 28 29 28  ' GLUCOSE  --   < >  138* 93 95 100*  BUN  --   < > 40* 30* 32* 28*  CREATININE  --   < > 1.73* 1.62* 1.58* 1.50*  CALCIUM  --   < > 6.3* 6.5* 6.5* 6.3*  PROT 4.8*  --  5.2*  --   --   --   ALBUMIN 1.7*  --  1.8* 1.9*  --   --   AST 70*  --  510*  --   --   --   ALT 76*  --  261*  --   --   --   ALKPHOS 155*  --  256*  --   --   --   BILITOT 0.2*  --  0.7  --   --   --   GFRNONAA  --   < > 28* 30* 31* 33*  GFRAA  --   < > 33* 35* 36* 39*  ANIONGAP  --   < > '10 12 8 12  ' < > = values in this interval not displayed.   Hematology Recent Labs Lab 04/27/17 0313 04/28/17 0357 04/29/17 0410  WBC 37.0* 22.7* 14.1*  RBC 3.16* 3.28* 3.46*  HGB 8.8* 9.3* 9.7*  HCT 27.4* 28.1* 30.5*  MCV 86.7 85.7 88.2  MCH 27.8 28.4 28.0  MCHC 32.1 33.1 31.8  RDW 17.4* 19.5* 20.5*  PLT 556* 550* 499*    Cardiac Enzymes Recent Labs Lab 04/24/17 0330 04/25/17 0800 04/26/17 0507 04/26/17 0842  TROPONINI 3.61* 3.45* 2.87* 2.28*   No results for input(s): TROPIPOC in the last 168 hours.    BNPNo results for input(s): BNP, PROBNP in the last 168 hours.   DDimer No results for input(s): DDIMER in the last 168 hours.   Radiology    Dg Chest Port 1 View  Result Date: 04/29/2017 CLINICAL DATA:  Respiratory failure. EXAM: PORTABLE CHEST 1 VIEW COMPARISON:  04/28/2017. FINDINGS: Endotracheal tube, NG tube, left IJ line in stable position. Heart size stable. Mild bilateral pulmonary infiltrates/edema and small bilateral pleural effusions noted suggesting mild CHF. No pneumothorax . Stable position. Heart size normal. Diffuse bilateral pulmonary infiltrates IMPRESSION: 1. Lines and tubes in stable position. 2. Diffuse bilateral pulmonary infiltrates and small bilateral pleural effusions suggesting mild CHF. Electronically Signed   By: Marcello Moores  Register   On: 04/29/2017 06:39   Dg Chest Port 1 View  Result Date: 04/28/2017 CLINICAL DATA:  ET tube, acute respiratory failure EXAM: PORTABLE CHEST 1 VIEW COMPARISON:  04/27/2017 FINDINGS: Endotracheal tube is 4 cm above the carina, stable. Remainder of the support devices are stable. Biapical pleural thickening/ scarring. Left lower lobe atelectasis or infiltrate with small left effusion. Heart is upper limits normal in size. Mild interstitial prominence is stable. IMPRESSION: No significant change since prior study. Electronically Signed   By: Rolm Baptise M.D.   On: 04/28/2017 07:41    Cardiac Studies   Echo 04/21/17: Study Conclusions  - Left ventricle: The cavity size was normal. Wall thickness was normal. Systolic function was mildly reduced. The estimated ejection fraction was 45%. - Regional wall motion abnormality: Hypokinesis of the basal-mid anteroseptal, basal-mid inferoseptal, basal-mid inferior, and apical septal myocardium. - Mitral valve: There was moderate regurgitation. - Right ventricle: Systolic function was mildly reduced. - Tricuspid valve: There was mild regurgitation.  Patient Profile     71 y.o.  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  Assessment & Plan    Sinus Loletha Grayer and Junct. Rhythm resolved.  Was 65s on admit and required external pacing.  Responded well to dobutamine.  Now stopped.  Holding nodal agents and was not on any pre hospital.   --hx of transient CHB with Lexiscan 5041  Chronic systolic and diastolic HF.  Off pressors.  +12,217 since admit   Elevated troponin with pk at 4.39  Will need ischemia eval on ASA. --nuc 2013 with no ischemia   AKI  Cr at 1.50 followed by renal  Acute resp failure with aspiration PNA /sepsis on Vent and fentanyl.   Signed, Cecilie Kicks, NP  04/29/2017, 9:52 AM    History and all data above reviewed.  Patient examined.  I agree with the findings as above.  She is awake and answering questions.  Weaning on the vent.  She seems to be confused but in no distressThe patient exam reveals COR:RRR  ,  Lungs: Clear  ,  Abd: Positive bowel sounds, no rebound no guarding , Ext Diffuse edema legs and left leg  .  All available labs, radiology testing, previous records reviewed. Agree with documented assessment and plan. Bradycardia:  Continue to follow.  Rate is stable.  No indication for further intervention at this time.  Elevated trop:  EF is mild/mod reduced with mild trop elevation.  This maybe a secondary event rather than primary.  There is a repeat echo pending and we will consider invasive vs noninvasive evaluation much later in this hospitalization pending her improvement in other areas.  Continue IV Lasix for fluid mobilization.    Jeneen Rinks Lisia [place]bay  10:50 AM  04/29/2017

## 2017-04-29 NOTE — Progress Notes (Signed)
Attempted bedside swallow evaluation 4 hours post extubation per MD. Pt immediately coughed upon first sip of water. Will keep pt NPO and place formal swallow evaluation

## 2017-04-30 ENCOUNTER — Inpatient Hospital Stay (HOSPITAL_COMMUNITY): Payer: Medicare HMO

## 2017-04-30 DIAGNOSIS — G9340 Encephalopathy, unspecified: Secondary | ICD-10-CM

## 2017-04-30 LAB — BASIC METABOLIC PANEL
ANION GAP: 16 — AB (ref 5–15)
BUN: 27 mg/dL — AB (ref 6–20)
CHLORIDE: 91 mmol/L — AB (ref 101–111)
CO2: 28 mmol/L (ref 22–32)
Calcium: 5.8 mg/dL — CL (ref 8.9–10.3)
Creatinine, Ser: 1.37 mg/dL — ABNORMAL HIGH (ref 0.44–1.00)
GFR, EST AFRICAN AMERICAN: 43 mL/min — AB (ref >60)
GFR, EST NON AFRICAN AMERICAN: 37 mL/min — AB (ref >60)
Glucose, Bld: 111 mg/dL — ABNORMAL HIGH (ref 65–99)
POTASSIUM: 2.9 mmol/L — AB (ref 3.5–5.1)
SODIUM: 135 mmol/L (ref 135–145)

## 2017-04-30 LAB — GLUCOSE, CAPILLARY
GLUCOSE-CAPILLARY: 99 mg/dL (ref 65–99)
GLUCOSE-CAPILLARY: 143 mg/dL — AB (ref 65–99)
Glucose-Capillary: 103 mg/dL — ABNORMAL HIGH (ref 65–99)
Glucose-Capillary: 77 mg/dL (ref 65–99)
Glucose-Capillary: 93 mg/dL (ref 65–99)
Glucose-Capillary: 96 mg/dL (ref 65–99)

## 2017-04-30 LAB — CBC
HCT: 29.5 % — ABNORMAL LOW (ref 36.0–46.0)
Hemoglobin: 9.4 g/dL — ABNORMAL LOW (ref 12.0–15.0)
MCH: 28.2 pg (ref 26.0–34.0)
MCHC: 31.9 g/dL (ref 30.0–36.0)
MCV: 88.6 fL (ref 78.0–100.0)
PLATELETS: 371 10*3/uL (ref 150–400)
RBC: 3.33 MIL/uL — AB (ref 3.87–5.11)
RDW: 21.2 % — AB (ref 11.5–15.5)
WBC: 9.5 10*3/uL (ref 4.0–10.5)

## 2017-04-30 LAB — PROCALCITONIN: PROCALCITONIN: 0.32 ng/mL

## 2017-04-30 LAB — MAGNESIUM: MAGNESIUM: 1.8 mg/dL (ref 1.7–2.4)

## 2017-04-30 MED ORDER — SENNOSIDES 8.8 MG/5ML PO SYRP
5.0000 mL | ORAL_SOLUTION | Freq: Two times a day (BID) | ORAL | Status: DC
  Administered 2017-04-30 – 2017-05-01 (×2): 5 mL via ORAL
  Filled 2017-04-30 (×9): qty 5

## 2017-04-30 MED ORDER — DOCUSATE SODIUM 50 MG/5ML PO LIQD
50.0000 mg | Freq: Every day | ORAL | Status: DC | PRN
  Administered 2017-04-30 – 2017-05-02 (×2): 50 mg via ORAL
  Filled 2017-04-30 (×3): qty 10

## 2017-04-30 MED ORDER — POTASSIUM CHLORIDE 10 MEQ/50ML IV SOLN
10.0000 meq | INTRAVENOUS | Status: AC
  Administered 2017-04-30 (×4): 10 meq via INTRAVENOUS
  Filled 2017-04-30 (×4): qty 50

## 2017-04-30 MED ORDER — POTASSIUM CHLORIDE 10 MEQ/100ML IV SOLN: 10.0000 meq | INTRAVENOUS | Status: DC

## 2017-04-30 MED ORDER — POTASSIUM CHLORIDE CRYS ER 20 MEQ PO TBCR: 40.0000 meq | EXTENDED_RELEASE_TABLET | Freq: Once | ORAL | Status: DC

## 2017-04-30 MED ORDER — SODIUM CHLORIDE 0.9 % IV SOLN
1.0000 g | Freq: Once | INTRAVENOUS | Status: AC
  Administered 2017-04-30: 1 g via INTRAVENOUS
  Filled 2017-04-30: qty 10

## 2017-04-30 MED ORDER — PANTOPRAZOLE SODIUM 40 MG IV SOLR
40.0000 mg | INTRAVENOUS | Status: DC
  Administered 2017-05-01: 40 mg via INTRAVENOUS
  Filled 2017-04-30 (×2): qty 40

## 2017-04-30 NOTE — Progress Notes (Signed)
CRITICAL VALUE ALERT  Critical Value:  K+=2.9 Ca++=5.8  Date & Time Notied: 04/30/17; 9604  Provider Notified: Shawna Orleans, CCM Resident  Orders Received/Actions taken:  Will put in order for 4 runs of IV K+; Reported she would look into Ca++ and Albumin before placing new orders

## 2017-04-30 NOTE — Progress Notes (Signed)
Pt has increased agitation and delirium from yesterday's assessments. Lights in room turned on, pt re-oriented to place, time, and situation. Family instructed to continue to re-orient pt intermittently as well. Will continue to monitor for increased agitation.

## 2017-04-30 NOTE — Evaluation (Signed)
Clinical/Bedside Swallow Evaluation Patient Details  Name: Alexandra House MRN: [id number] Date of Birth: December 16, 1945  Today's Date: 04/30/2017 Time: SLP Start Time (ACUTE ONLY): 7169 SLP Stop Time (ACUTE ONLY): 6789 (then back in  9:25 to 9:35) SLP Time Calculation (min) (ACUTE ONLY): 14 min  Past Medical History:  Past Medical History:  Diagnosis Date  . Anxiety   . Carotid stenosis   . Fibromyalgia   . HLD (hyperlipidemia)   . Hx of cardiovascular stress test    a. Lex MV 11/13: + significant ECG changes and CHB with infusion; EF 81%, no ischemia,   . Hx of echocardiogram    a. Echo 12/12: EF 38-10%, grade 2 diastolic dysfunction, mild MR.; b. Echo 11/13:  mild LVH, EF 60-65%  . Neuropathy   . Osteoporosis   . Syncope 08/2009   a. ETT-Echo 1/11:  poor ex tol, submax exercise, no WMA or ECG changes c/w ischemia;     Past Surgical History:  Past Surgical History:  Procedure Laterality Date  . None     HPI:  Alexandra House an 71 y.o.femalepast medical history of COPD, osteoporosis, fibromyalgia, anxiety relates 4 weeks of intractable nausea vomiting without pain she had an EGD and abdominal ultrasound which were unremarkable. Per chart pt has lost 10 pounds over the last 4 weeks for nausea and vomiting. Found to have acute renal failure. Intubated 7/8-7/12 and reintubated 7/12-7/16. CXR diffuse bilateral pulmonary infiltrates and small bilateral pleural effusions suggesting mild CHF.     Assessment / Plan / Recommendation Clinical Impression  Pt exhibiting ICU delirium, paranoid and initially refusing to consume po's. Surprisingly, pt's vocal quality less hoarse that expected. No baseline cough or chest congestion present. Agreeable to minimal bites applesauce but refused continued to refuse thin despite encouragement and education. PT present, worked with pt and SLP returned following their session for further attempts. She consumed coke via straw without any s/s aspiration. Pt  is at higher aspiration risk due to intubated twice however she would be unable to currently participate in a FEES and has numerous lines for monitoring. Will cautiously initiate Dys 3 texture, thin liquids, straws allowed, crush meds and full supervision. Husband present, suppportive and agreeable with plan. Will check tomorrow. SLP Visit Diagnosis: Dysphagia, unspecified (R13.10)    Aspiration Risk   (mild-mod)    Diet Recommendation Dysphagia 3 (Mech soft);Thin liquid   Liquid Administration via: Straw;Cup Medication Administration: Crushed with puree Supervision: Patient able to self feed;Full supervision/cueing for compensatory strategies Compensations: Slow rate;Small sips/bites;Minimize environmental distractions Postural Changes: Seated upright at 90 degrees    Other  Recommendations Oral Care Recommendations: Oral care BID   Follow up Recommendations None      Frequency and Duration min 2x/week  2 weeks       Prognosis Prognosis for Safe Diet Advancement: Good Barriers to Reach Goals: Cognitive deficits      Swallow Study   General HPI: Alexandra House an 71 y.o.femalepast medical history of COPD, osteoporosis, fibromyalgia, anxiety relates 4 weeks of intractable nausea vomiting without pain she had an EGD and abdominal ultrasound which were unremarkable. Per chart pt has lost 10 pounds over the last 4 weeks for nausea and vomiting. Found to have acute renal failure. Intubated 7/8-7/12 and reintubated 7/12-7/16. CXR diffuse bilateral pulmonary infiltrates and small bilateral pleural effusions suggesting mild CHF.   Type of Study: Bedside Swallow Evaluation Previous Swallow Assessment:  (none) Diet Prior to this Study: NPO Temperature Spikes Noted: No  Respiratory Status: Nasal cannula History of Recent Intubation: Yes Length of Intubations (days):  (intubated x 2) Date extubated: 04/29/17 Behavior/Cognition: Alert;Requires cueing Oral Cavity Assessment: Within  Functional Limits Oral Care Completed by SLP: Yes Oral Cavity - Dentition: Adequate natural dentition Vision: Functional for self-feeding Self-Feeding Abilities: Able to feed self Patient Positioning: Upright in bed (then chair) Baseline Vocal Quality: Hoarse (milder than expected) Volitional Cough: Cognitively unable to elicit Volitional Swallow: Unable to elicit    Oral/Motor/Sensory Function Overall Oral Motor/Sensory Function: Within functional limits   Ice Chips Ice chips: Not tested   Thin Liquid Thin Liquid: Within functional limits Presentation: Cup;Straw    Nectar Thick Nectar Thick Liquid: Not tested   Honey Thick Honey Thick Liquid: Not tested   Puree Puree: Within functional limits   Solid   GO   Solid:  (pt refused)        Houston Siren 04/30/2017,9:54 AM   Orbie Pyo Colvin Caroli.Ed [employer] [phone]

## 2017-04-30 NOTE — Evaluation (Signed)
Physical Therapy Evaluation Patient Details Name: Alexandra House MRN: [id number] DOB: 06-05-1946 Today's Date: 04/30/2017   History of Present Illness  Pt adm with septic shock likely due to aspiration PNA. Pt intubated 7/8 and extubated 7/12. Reintubated 7/13 and extubated 7/16. Pt with bradycardia requiring external pacing on admission. PMH - chf, copd, osteoporosis, depression, syncope  Clinical Impression  Pt admitted with above diagnosis and presents to PT with functional limitations due to deficits listed below (See PT problem list). Pt needs skilled PT to maximize independence and safety to allow discharge possibly to SNF unless she progresses rapidly. Pt's delirium is currently significantly limiting mobility.     Follow Up Recommendations SNF (unless progresses quickly)    Equipment Recommendations  Rolling walker with 5" wheels    Recommendations for Other Services       Precautions / Restrictions Precautions Precautions: Fall Restrictions Weight Bearing Restrictions: No      Mobility  Bed Mobility Overal bed mobility: Needs Assistance Bed Mobility: Supine to Sit     Supine to sit: Mod assist     General bed mobility comments: Assist to initiate movement. Assist to move legs off bed, elevate trunk into sitting, and bring hips to EOB. More assist needed due to cognition.  Transfers Overall transfer level: Needs assistance Equipment used: 2 person hand held assist;Rolling walker (2 wheeled) Transfers: Sit to/from [facility] Sit to Stand: +2 physical assistance;Max assist;Mod assist Stand pivot transfers: +2 physical assistance;Max assist       General transfer comment: Assist to stand from bed with +2 max A for bringing up hips and trunk. +2 max assist to pivot from bed to chair. Pt then stood from chair with walker with +2 mod assist  Ambulation/Gait                Stairs            Wheelchair Mobility    Modified Rankin  (Stroke Patients Only)       Balance Overall balance assessment: Needs assistance Sitting-balance support: No upper extremity supported;Feet supported Sitting balance-Leahy Scale: Fair     Standing balance support: Bilateral upper extremity supported Standing balance-Leahy Scale: Poor Standing balance comment: walker and mod assist for static standing with the walker                             Pertinent Vitals/Pain Pain Assessment: Faces Faces Pain Scale: No hurt Pain Intervention(s): Monitored during session    Home Living Family/patient expects to be discharged to:: Private residence Living Arrangements: Spouse/significant other Available Help at Discharge: Family;Available 24 hours/day Type of Home: House Home Access: Stairs to enter Entrance Stairs-Rails: Right Entrance Stairs-Number of Steps: 7-8 Home Layout: One level Home Equipment: None      Prior Function Level of Independence: Independent               Hand Dominance        Extremity/Trunk Assessment   Upper Extremity Assessment Upper Extremity Assessment: Defer to OT evaluation    Lower Extremity Assessment Lower Extremity Assessment: Difficult to assess due to impaired cognition;Generalized weakness       Communication   Communication: No difficulties  Cognition Arousal/Alertness: Awake/alert   Overall Cognitive Status: Impaired/Different from baseline Area of Impairment: Orientation;Following commands;Safety/judgement;Problem solving;Attention;Memory                       Following Commands:  Follows one step commands inconsistently Safety/Judgement: Decreased awareness of safety;Decreased awareness of deficits   Problem Solving: Requires verbal cues;Requires tactile cues;Slow processing;Decreased initiation General Comments: Pt with delirium and appears to be suspicious and at times refused to answer questions.      General Comments      Exercises      Assessment/Plan    PT Assessment Patient needs continued PT services  PT Problem List Decreased strength;Decreased activity tolerance;Decreased balance;Decreased mobility;Decreased cognition;Decreased knowledge of use of DME;Decreased safety awareness       PT Treatment Interventions DME instruction;Gait training;Functional mobility training;Therapeutic activities;Therapeutic exercise;Balance training;Cognitive remediation;Patient/family education    PT Goals (Current goals can be found in the Care Plan section)  Acute Rehab PT Goals Patient Stated Goal: Not stated PT Goal Formulation: With patient Time For Goal Achievement: 05/14/17 Potential to Achieve Goals: Good    Frequency Min 3X/week   Barriers to discharge Inaccessible home environment Stairs to enter home    Co-evaluation               AM-PAC PT "6 Clicks" Daily Activity  Outcome Measure Difficulty turning over in bed (including adjusting bedclothes, sheets and blankets)?: Total Difficulty moving from lying on back to sitting on the side of the bed? : Total Difficulty sitting down on and standing up from a chair with arms (e.g., wheelchair, bedside commode, etc,.)?: Total Help needed moving to and from a bed to chair (including a wheelchair)?: Total Help needed walking in hospital room?: Total Help needed climbing 3-5 steps with a railing? : Total 6 Click Score: 6    End of Session Equipment Utilized During Treatment: Gait belt;Oxygen Activity Tolerance: Patient limited by fatigue;Other (comment) (cognition) Patient left: in chair;with call bell/phone within reach;with chair alarm set;with family/visitor present Nurse Communication: Mobility status PT Visit Diagnosis: Unsteadiness on feet (R26.81);Muscle weakness (generalized) (M62.81);Difficulty in walking, not elsewhere classified (R26.2)    Time: 4514-6047 PT Time Calculation (min) (ACUTE ONLY): 26 min   Charges:   PT Evaluation $PT Eval Moderate  Complexity: 1 Procedure PT Treatments $Therapeutic Activity: 8-22 mins   PT G Codes:        [facility] [place] 04/30/2017, 12:01 PM

## 2017-04-30 NOTE — NC FL2 (Signed)
[place] LEVEL OF CARE SCREENING TOOL     IDENTIFICATION  Patient Name: Alexandra House Birthdate: [birth year] Sex: female Admission Date (Current Location): 04/20/2017  [facility] and [place] Number:  Herbalist and Address:  [facility], [place] [address] [place], [place] [address]      Provider Number: [id number]  Attending Physician Name and Address:  Juanito Doom, MD  Relative Name and Phone Number:       Current Level of Care: Hospital Recommended Level of Care: [place] Prior Approval Number:    Date Approved/Denied:   PASRR Number: [id number]  Discharge Plan: SNF    Current Diagnoses: Patient Active Problem List   Diagnosis Date Noted  . Acute encephalopathy   . Encounter for intubation   . Hypokalemia 04/22/2017  . Hypocalcemia 04/22/2017  . Acute respiratory failure with hypoxemia ([place])   . Aspiration pneumonia of both lungs due to vomit ([place])   . Septic shock ([place])   . Hypotension 04/20/2017  . Severe dehydration 04/20/2017  . Intractable nausea and vomiting 04/20/2017  . AKI (acute kidney injury) ([place]) 04/20/2017  . Sinus bradycardia 04/20/2017  . Hypothermia 04/20/2017  . HLD (hyperlipidemia) 04/20/2017  . COPD (chronic obstructive pulmonary disease) ([place]) 04/20/2017  . Tobacco abuse 04/20/2017  . Elevated lactic acid level 04/20/2017  . Elevated troponin 04/20/2017  . Carotid stenosis, bilateral 04/20/2017  . Severe sepsis with septic shock ([place]) 04/20/2017  . NSTEMI (non-ST elevated myocardial infarction) ([place])   . Cerebrovascular disease 04/06/2013  . Noninfectious gastroenteritis and colitis 04/01/2013  . Bruit 01/06/2013  . Nonspecific abnormal unspecified cardiovascular function study 01/06/2013  . Chest pain 08/28/2012  . CARDIAC MURMUR 12/05/2009  . PURE HYPERCHOLESTEROLEMIA 09/12/2009  . TOBACCO ABUSE 09/12/2009  . FIBROMYALGIA 09/12/2009  . SYNCOPE 09/12/2009     Orientation RESPIRATION BLADDER Height & Weight     Self  O2, Other (Comment) (Nasal canula 1 L. Bipap: Respiratory rate (36), Oxygen percent (40).) Continent, Indwelling catheter Weight: 106 lb 4.2 oz (48.2 kg) Height:  4\' 10"  (147.3 cm)  BEHAVIORAL SYMPTOMS/MOOD NEUROLOGICAL BOWEL NUTRITION STATUS   (Uncooperative, agitated, restless, noncompliant)  (None) Continent Diet (DYS 3. Crushed pills.)  AMBULATORY STATUS COMMUNICATION OF NEEDS Skin   Extensive Assist Verbally Bruising, Other (Comment) Software engineer)                       Personal Care Assistance Level of Assistance  Bathing, Feeding, Dressing Bathing Assistance: Maximum assistance Feeding assistance: Maximum assistance Dressing Assistance: Maximum assistance     Functional Limitations Info  Sight, Hearing, Speech Sight Info: Adequate Hearing Info: Adequate Speech Info: Adequate    SPECIAL CARE FACTORS FREQUENCY  PT (By licensed PT), OT (By licensed OT), Speech therapy, Blood pressure     PT Frequency: 5 x week OT Frequency: 5 x week     Speech Therapy Frequency: 5 x week      Contractures Contractures Info: Not present    Additional Factors Info  Code Status, Allergies Code Status Info: Full Allergies Info: Bee venom           Current Medications (04/30/2017):  This is the current hospital active medication list Current Facility-Administered Medications  Medication Dose Route Frequency Provider Last Rate Last Dose  . 0.9 %  sodium chloride infusion   Intravenous Continuous Orson Eva J, DO 10 mL/hr at 04/30/17 0144    . 0.9 %  sodium chloride  infusion   Intra-arterial PRN Mannam, Praveen, MD      . acetaminophen (TYLENOL) solution 650 mg  650 mg Per Tube Q6H PRN Javier Glazier, MD   650 mg at 04/27/17 2335  . arformoterol (BROVANA) nebulizer solution 15 mcg  15 mcg Nebulization BID Simonne Maffucci B, MD   15 mcg at 04/30/17 0817  . aspirin tablet 325 mg  325 mg Per Tube Daily Wilhelmina Mcardle,  MD   325 mg at 04/30/17 0932  . budesonide (PULMICORT) nebulizer solution 0.5 mg  0.5 mg Nebulization BID Simonne Maffucci B, MD   0.5 mg at 04/30/17 0819  . chlorhexidine (PERIDEX) 0.12 % solution 15 mL  15 mL Mouth Rinse BID Simonne Maffucci B, MD   15 mL at 04/29/17 2213  . Chlorhexidine Gluconate Cloth 2 % PADS 6 each  6 each Topical Daily Simonne Maffucci B, MD   6 each at 04/29/17 1000  . furosemide (LASIX) injection 80 mg  80 mg Intravenous Daily Fleet Contras, MD   80 mg at 04/30/17 8329  . guaiFENesin (MUCINEX) 12 hr tablet 600 mg  600 mg Oral BID Elwin Mocha, MD   600 mg at 04/26/17 2254  . heparin injection 5,000 Units  5,000 Units Subcutaneous Q8H Eloise Levels, MD   5,000 Units at 04/30/17 1317  . insulin aspart (novoLOG) injection 1-3 Units  1-3 Units Subcutaneous Q4H Orson Eva J, DO   1 Units at 04/30/17 1317  . ipratropium-albuterol (DUONEB) 0.5-2.5 (3) MG/3ML nebulizer solution 3 mL  3 mL Nebulization Q2H PRN Simonne Maffucci B, MD      . MEDLINE mouth rinse  15 mL Mouth Rinse q12n4p Simonne Maffucci B, MD   15 mL at 04/30/17 1319  . ondansetron (ZOFRAN) tablet 4 mg  4 mg Oral Q6H PRN Samella Parr, NP       Or  . ondansetron [facility]) injection 4 mg  4 mg Intravenous Q6H PRN Samella Parr, NP   4 mg at 04/28/17 0000  . [START ON 05/01/2017] pantoprazole (PROTONIX) injection 40 mg  40 mg Intravenous Q24H Kara Mead V, MD      . sodium chloride flush (NS) 0.9 % injection 10-40 mL  10-40 mL Intracatheter Q12H Juanito Doom, MD   10 mL at 04/30/17 0944  . sodium chloride flush (NS) 0.9 % injection 10-40 mL  10-40 mL Intracatheter PRN Juanito Doom, MD         Discharge Medications: Please see discharge summary for a list of discharge medications.  Relevant Imaging Results:  Relevant Lab Results:   Additional Information SS#: [id number]  Candie Chroman, LCSW

## 2017-04-30 NOTE — Progress Notes (Signed)
PULMONARY / CRITICAL CARE MEDICINE   Name: Alexandra House MRN: [id number] DOB: [birth year]    ADMISSION DATE:04/20/2017 REFERRING MD:Dr. Aileen Fass   CHIEF COMPLAINT:AMS, Hypotension, Hypothermia, Bradycardia   HISTORY OF PRESENT ILLNESS: [name] who initially presented with bradycardia in the 30's, hypotension (79/39), tachypnea, hypothermic. She was treated with IVF with improvement in BP. The patient was admitted per [facility] and treated for suspected sepsis. She developed worsening hypotension, bradycardia, altered mental status and increased work of breathing on 7/8. PCCM called back for re-evaluation. Extubated 7/12, and reintubated 7/13 due to resp distress. Continued to improve on PO vanc, meropenem and zyvox.   SUBJECTIVE: Continues off pressors, extubated yesterday and failed bedside swallow. K+ to 2.9 and Ca+ 5.8 this AM. Confused this morning.   VITAL SIGNS: BP (!) 105/57   Pulse 79   Temp 97.6 F (36.4 C) (Oral)   Resp 13   Ht '4\' 10"'  (1.473 m)   Wt 106 lb 4.2 oz (48.2 kg)   SpO2 100%   BMI 22.21 kg/m   HEMODYNAMICS:    VENTILATOR SETTINGS: Vent Mode: PSV;CPAP FiO2 (%):  [30 %] 30 % Set Rate:  [16 bmp] 16 bmp Vt Set:  [360 mL] 360 mL PEEP:  [5 cmH20] 5 cmH20 Pressure Support:  [5 cmH20] 5 cmH20 Plateau Pressure:  [16 cmH20] 16 cmH20  INTAKE / OUTPUT: I/O last 3 completed shifts: In: 9528 [I.V.:548; NG/GT:40; IV Piggyback:1060] Out: 4220 [Urine:4220]  PHYSICAL EXAMINATION: General: [name] sitting up in bed comfortable and in NAD Neuro: alert only to herself, confused, moves all extremities HEENT:  PERRL, MMM Cardiovascular:  RRR, no MRG Lungs:  CTABL Abdomen:  +BS, soft, NTND Musculoskeletal:  No gross deformities Ext: edema in upper and lower extremities  LABS:  BMET  Recent Labs Lab 04/28/17 2110 04/29/17 0410 04/30/17 0358  NA 132* 133* 135  K 3.5 3.5 2.9*  CL 95* 93* 91*  CO2 '29 28 28  ' BUN 32* 28* 27*  CREATININE 1.58* 1.50*  1.37*  GLUCOSE 95 100* 111*    Electrolytes  Recent Labs Lab 04/28/17 0357 04/28/17 2110 04/29/17 0410 04/30/17 0358  CALCIUM 6.5* 6.5* 6.3* 5.8*  MG 1.5* 2.5* 2.3 1.8  PHOS 3.0 3.2 3.5  --     CBC  Recent Labs Lab 04/28/17 0357 04/29/17 0410 04/30/17 0358  WBC 22.7* 14.1* 9.5  HGB 9.3* 9.7* 9.4*  HCT 28.1* 30.5* 29.5*  PLT 550* 499* 371    Coag's  Recent Labs Lab 04/23/17 0745  APTT 68*    Sepsis Markers  Recent Labs Lab 04/25/17 1157 04/26/17 0230 04/26/17 0657 04/26/17 1539 04/29/17 1823 04/30/17 0358  LATICACIDVEN  --  6.0* 7.6* 2.5*  --   --   PROCALCITON 0.68  --   --   --  0.48 0.32    ABG  Recent Labs Lab 04/26/17 0206 04/26/17 0640 04/26/17 0820  PHART 7.352 7.231* 7.333*  PCO2ART 18.6* 24.4* 25.1*  PO2ART 109.0* 97.4 126*    Liver Enzymes  Recent Labs Lab 04/23/17 1046 04/27/17 0313 04/28/17 0357  AST 70* 510*  --   ALT 76* 261*  --   ALKPHOS 155* 256*  --   BILITOT 0.2* 0.7  --   ALBUMIN 1.7* 1.8* 1.9*    Cardiac Enzymes  Recent Labs Lab 04/25/17 0800 04/26/17 0507 04/26/17 0842  TROPONINI 3.45* 2.87* 2.28*    Glucose  Recent Labs Lab 04/29/17 0724 04/29/17 1159 04/29/17 1530 04/29/17 2013 04/29/17 2327 04/30/17  0326  GLUCAP 92 118* 107* 106* 101* 103*    Imaging No results found.   STUDIES: 7/05 [place] ABD >>abdominal US on 7/5 which was negative for gallstones, hepatic abnormality, normal pancreas &no hydronephrosis.  7/7 CT chest/abdomen/pelvis> emphysema, mult-lobar pneumonia, fatty liver, fat stranding around head of pancreas 7/13 CT chest/abdomen/pelvis> Moderate B/L effusions, patchy air space disease at bases. Anasarca 7/16 CXR> Diffuse bilateral pulmonary infiltrates and small bilateral pleural effusions suggesting mild CHF.  CULTURES: BCx2 7/7>> final negative UC 7/7 >>final negative BCX 7/10>>NGTD x2 days resp culture 7/10 >> few candida albicans BCX 7/13>> Ucx  7/13>> Sputum cx>>  ANTIBIOTICS: Zosyn 7/7 >>7/13 Vanco 7/7 >>7/10 Vancomycin PO 7/13>> 7/17 Zyvox 7/14 >> 7/17 Meropenem 7/14>> 7/17  SIGNIFICANT EVENTS: 7/07 Admit with N/V, weakness, hypotension  7/8 >> intubated 7/12>> extubated 7/13>> intubated 7/16>> extubated 7/17>> new onset acute confusion  LINES/TUBES: ETT 7/8 >>7/12 ETT 7/13>> 7/16 CVC LIJ 7/8 >> Urethral cath 7/8>> NGT 7/8>>  DISCUSSION: 71 y/o female previously with septic shock, aspiration and elevated troponin likely 2/2 demand ischemia vs: NSTEMI. Extubated  Improved leukocytosis and afebrile. Patient does   ASSESSMENT / PLAN:  PULMONARY A: Acute Respiratory Failure Centrilobular Emphysema  Aspiration PNA, resolving P: Satting appropriately 2LNC Brovana/pulmicort, mucinex Continue duobnebs  CARDIOVASCULAR A:  Bradycardia, with junctional escape rhythm, stable Elevated Troponin - suspect demand ischemia Hx Grade II Diastolic Dysfunction, LVEF Hx Carotid Stenosis  Shock: presumably septic vs cardiogenic P: Echo EF 45-50%, PA peak pressure: 38 mm Hg (S). Cont ASA Cardiology following; appreciate assistance  RENAL A:  AKI Hypokalemia, resolved Hypocalcemia: 5.8 K: 2.9 P: Continue lasix 32m IV daily Continue to monitor UOP Replacing KCl 166m x4 runs Calcium gluconate 1g  GASTROINTESTINAL A:  Nausea / Vomiting, Weight Loss - ? If related to Prolia  Elevated alk phos without evidence of biliary obstruction Indeterminate C. Diff Failed bedside swallow study P: SLP swallow eval zofran PRN nausea protonix [id number] See ID Formal swallow eval 7/17  HEMATOLOGIC A:  Anemia, stable. hgb to 9.7 P: Monitor for bleeding Transfusion threshold <8 Lovenox  INFECTIOUS A:  Aspiration pneumonia On empiric treatment for C diff, improving P: Meropenem, linezolid and PO vanc  ENDOCRINE A:  Hyperglycemia  P: SSI Discontinue solucortef 79m36m2hrs    NEUROLOGIC A:  Acute confusion P: CT head w/o contrast ativan 1mg 31m PRN   FAMILY - Updates: Updated husband and son at bedside at length/ 7/15 - Inter-disciplinary family meet or Palliative Care meeting due by: day 7   [place]ulmonary and [place]: [phone]/2018, 7:11 AM

## 2017-04-30 NOTE — Progress Notes (Signed)
Progress Note  Patient Name: Alexandra House Date of Encounter: 04/30/2017  Primary Cardiologist: Stanford Breed  Subjective   Up in chair, no chest pain, no SOB but confused, extubated.    Inpatient Medications    Scheduled Meds: . arformoterol  15 mcg Nebulization BID  . aspirin  325 mg Per Tube Daily  . budesonide (PULMICORT) nebulizer solution  0.5 mg Nebulization BID  . chlorhexidine  15 mL Mouth Rinse BID  . Chlorhexidine Gluconate Cloth  6 each Topical Daily  . furosemide  80 mg Intravenous Daily  . guaiFENesin  600 mg Oral BID  . heparin subcutaneous  5,000 Units Subcutaneous Q8H  . hydrocortisone sod succinate (SOLU-CORTEF) inj  50 mg Intravenous Q12H  . insulin aspart  1-3 Units Subcutaneous Q4H  . mouth rinse  15 mL Mouth Rinse q12n4p  . pantoprazole (PROTONIX) IV  40 mg Intravenous Q12H  . sodium chloride flush  10-40 mL Intracatheter Q12H  . vancomycin  500 mg Oral Q6H   Continuous Infusions: . sodium chloride 10 mL/hr at 04/30/17 0144  . sodium chloride    . linezolid (ZYVOX) IV Stopped (04/30/17 0027)  . meropenem (MERREM) IV 1 g (04/30/17 0940)  . potassium chloride 10 mEq (04/30/17 0939)   PRN Meds: Place/Maintain arterial line **AND** sodium chloride, acetaminophen (TYLENOL) oral liquid 160 mg/5 mL, ipratropium-albuterol, ondansetron **OR** ondansetron (ZOFRAN) IV, sodium chloride flush   Vital Signs    Vitals:   04/30/17 0817 04/30/17 0819 04/30/17 0830 04/30/17 0900  BP:    (!) 119/52  Pulse:    78  Resp:    13  Temp:   97.8 F (36.6 C)   TempSrc:   Axillary   SpO2: 99% 100%  98%  Weight:      Height:        Intake/Output Summary (Last 24 hours) at 04/30/17 0951 Last data filed at 04/30/17 0940  Gross per 24 hour  Intake           606.79 ml  Output             2585 ml  Net         -1978.21 ml   Filed Weights   04/28/17 0400 04/29/17 0400 04/30/17 0400  Weight: 111 lb 12.4 oz (50.7 kg) 112 lb 14 oz (51.2 kg) 106 lb 4.2 oz (48.2 kg)     Telemetry    SR with PACs and non conducted PACs - Personally Reviewed  ECG    No new - Personally Reviewed  Physical Exam   GEN: No acute distress.   Neck: No JVD Cardiac: RRR, no murmurs, rubs, or gallops.  Respiratory: Clear to auscultation bilaterally. From ant position GI: Soft, nontender, non-distended, + BS  MS: No pitting edema but puffy lower ext; No deformity. Neuro:  stares at times, slow to respond    Psych: Normal affect   Labs    Chemistry Recent Labs Lab 04/23/17 1046  04/27/17 0313 04/28/17 0357 04/28/17 2110 04/29/17 0410 04/30/17 0358  NA  --   < > 137 135 132* 133* 135  K  --   < > 3.3* 3.0* 3.5 3.5 2.9*  CL  --   < > 97* 95* 95* 93* 91*  CO2  --   < > 30 28 29 28 28   GLUCOSE  --   < > 138* 93 95 100* 111*  BUN  --   < > 40* 30* 32* 28* 27*  CREATININE  --   < >  1.73* 1.62* 1.58* 1.50* 1.37*  CALCIUM  --   < > 6.3* 6.5* 6.5* 6.3* 5.8*  PROT 4.8*  --  5.2*  --   --   --   --   ALBUMIN 1.7*  --  1.8* 1.9*  --   --   --   AST 70*  --  510*  --   --   --   --   ALT 76*  --  261*  --   --   --   --   ALKPHOS 155*  --  256*  --   --   --   --   BILITOT 0.2*  --  0.7  --   --   --   --   GFRNONAA  --   < > 28* 30* 31* 33* 37*  GFRAA  --   < > 33* 35* 36* 39* 43*  ANIONGAP  --   < > 10 12 8 12  16*  < > = values in this interval not displayed.   Hematology Recent Labs Lab 04/28/17 0357 04/29/17 0410 04/30/17 0358  WBC 22.7* 14.1* 9.5  RBC 3.28* 3.46* 3.33*  HGB 9.3* 9.7* 9.4*  HCT 28.1* 30.5* 29.5*  MCV 85.7 88.2 88.6  MCH 28.4 28.0 28.2  MCHC 33.1 31.8 31.9  RDW 19.5* 20.5* 21.2*  PLT 550* 499* 371    Cardiac Enzymes Recent Labs Lab 04/24/17 0330 04/25/17 0800 04/26/17 0507 04/26/17 0842  TROPONINI 3.61* 3.45* 2.87* 2.28*   No results for input(s): TROPIPOC in the last 168 hours.   BNPNo results for input(s): BNP, PROBNP in the last 168 hours.   DDimer No results for input(s): DDIMER in the last 168 hours.   Radiology     Dg Chest Port 1 View  Result Date: 04/29/2017 CLINICAL DATA:  Respiratory failure. EXAM: PORTABLE CHEST 1 VIEW COMPARISON:  04/28/2017. FINDINGS: Endotracheal tube, NG tube, left IJ line in stable position. Heart size stable. Mild bilateral pulmonary infiltrates/edema and small bilateral pleural effusions noted suggesting mild CHF. No pneumothorax . Stable position. Heart size normal. Diffuse bilateral pulmonary infiltrates IMPRESSION: 1. Lines and tubes in stable position. 2. Diffuse bilateral pulmonary infiltrates and small bilateral pleural effusions suggesting mild CHF. Electronically Signed   By: Marcello Moores  Register   On: 04/29/2017 06:39    Cardiac Studies   ECHO 04/27/17   Study Conclusions  - Left ventricle: The cavity size was normal. Systolic function was   mildly reduced. The estimated ejection fraction was in the range   of 45% to 50%. There is akinesis of the basal-midinferoseptal   myocardium. There is akinesis of the basal-midinferior   myocardium. The study is not technically sufficient to allow   evaluation of LV diastolic function. - Aortic valve: Trileaflet; mildly thickened, mildly calcified   leaflets. - Mitral valve: Mild diffuse thickening of the anterior leaflet.   There was moderate regurgitation directed eccentrically and   toward the free wall. - Pulmonary arteries: PA peak pressure: 38 mm Hg (S).   ECHO 04/21/17 Study Conclusions  - Left ventricle: The cavity size was normal. Wall thickness was   normal. Systolic function was mildly reduced. The estimated   ejection fraction was 45%. - Regional wall motion abnormality: Hypokinesis of the basal-mid   anteroseptal, basal-mid inferoseptal, basal-mid inferior, and   apical septal myocardium. - Mitral valve: There was moderate regurgitation. - Right ventricle: Systolic function was mildly reduced. - Tricuspid valve: There was mild  regurgitation.  -------------------------------------------------------------------  Patient Profile     71 y.o. 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 Rhythm resolved now with PACs and non conducted PACs --hx of transient CHB with Lexiscan 2013  Elevated troponin with pk at 4.39  once improved may need nuc study  --mild reduction in EF 45% same on Echo on the 14th  AKI per renal, Cr 1.37 decreasing  Acute resp failure with aspiration PNA /sepsis on Fentanyl drip --now extubated up in chair  --confusion at times.  Hypokalemia received IV KCL X 4  Signed, Cecilie Kicks, NP  04/30/2017, 9:51 AM    History and all data above reviewed.  Patient examined.  I agree with the findings as above. Very confused but she seems to deny chest pain or SOB.  The patient exam reveals COR:RRR  ,  Lungs: Clear  ,  Abd: Positive bowel sounds, no rebound no guarding, Ext Mild to moderate edema  .  All available labs, radiology testing, previous records reviewed. Agree with documented assessment and plan.   Rhythm has improved.  Avoiding beta blockers or other AV nodal blocking agents.  No change in therapy at this point.  We will follow for med titration.  Likely that any further cardiac work up can be delayed until she has completely recovered.  Possibly after this hospitalization.   Jeneen Rinks Izek Corvino  11:20 AM  04/30/2017

## 2017-04-30 NOTE — Clinical Social Work Note (Signed)
Clinical Social Work Assessment  Patient Details  Name: Alexandra House MRN: [id number] Date of Birth: 01-02-1946  Date of referral:  04/30/17               Reason for consult:  Facility Placement, Discharge Planning                Permission sought to share information with:  Facility Sport and exercise psychologist, Family Supports Permission granted to share information::  Yes, Verbal Permission Granted  Name::     Carilyn Woolston  Agency::  SNF's  Relationship::  Husband  Contact Information:  [phone]  Housing/Transportation Living arrangements for the past 2 months:  [place] of Information:  Medical Team, Spouse Patient Interpreter Needed:  None Criminal Activity/Legal Involvement Pertinent to Current Situation/Hospitalization:  No - Comment as needed Significant Relationships:  Spouse, Adult Children Lives with:  Spouse Do you feel safe going back to the place where you live?  Yes Need for family participation in patient care:  Yes (Comment)  Care giving concerns:  PT recommending SNF once medically stable for discharge.   Social Worker assessment / plan:  Patient oriented to self only. Patient's husband not at bedside so CSW called him. CSW introduced role and explained that PT recommendations. Patient's husband prefers CIR to SNF. CSW explained that with patient's level of required assistance, PT may not have thought patient could handle 3-4 hours of therapy per day at this time. CSW explained that progress will be followed for improvement. Patient's husband is agreeable to SNF referral. Ultimately, he wants her to return home once able. No further concerns. CSW encouraged patient's husband to contact CSW as needed. CSW will continue to follow patient and her husband for support and facilitate discharge to SNF once medically stable.  Employment status:  Retired Nurse, adult PT Recommendations:  [place] /  Referral to community resources:  [place]  Patient/Family's Response to care:  Patient oriented to self only. Patient's husband agreeable to SNF placement but prefers CIR if able. Patient's husband supportive and involved in patient's care. Patient's husband appreciated social work intervention.  Patient/Family's Understanding of and Emotional Response to Diagnosis, Current Treatment, and Prognosis:  Patient oriented to self only. Patient's husband has a good understanding of the reason for admission and her need for rehab prior to returning home. Patient's husband appears happy with hospital care.  Emotional Assessment Appearance:  Appears stated age Attitude/Demeanor/Rapport:  Unable to Assess Affect (typically observed):  Unable to Assess Orientation:  Oriented to Self Alcohol / Substance use:  Tobacco Use Psych involvement (Current and /or in the community):  No (Comment)  Discharge Needs  Concerns to be addressed:  Care Coordination Readmission within the last 30 days:  No Current discharge risk:  Cognitively Impaired, Dependent with Mobility Barriers to Discharge:  Continued Medical Work up, [place], LCSW 04/30/2017, 2:45 PM

## 2017-04-30 NOTE — Clinical Social Work Placement (Signed)
   CLINICAL SOCIAL WORK PLACEMENT  NOTE  Date:  04/30/2017  Patient Details  Name: Alexandra House MRN: [id number] Date of Birth: 05-18-1946  Clinical Social Work is seeking post-discharge placement for this patient at the [place] level of care (*CSW will initial, date and re-position this form in  chart as items are completed):  Yes   Patient/family provided with [facility] Work Department's list of facilities offering this level of care within the geographic area requested by the patient (or if unable, by the patient's family).  Yes   Patient/family informed of their freedom to choose among providers that offer the needed level of care, that participate in Medicare, Medicaid or managed care program needed by the patient, have an available bed and are willing to accept the patient.  Yes   Patient/family informed of [facility]'s ownership interest in [facility] and [facility], as well as of the fact that they are under no obligation to receive care at these facilities.  PASRR submitted to EDS on 04/30/17     PASRR number received on 04/30/17     Existing PASRR number confirmed on       FL2 transmitted to all facilities in geographic area requested by pt/family on 04/30/17     FL2 transmitted to all facilities within larger geographic area on       Patient informed that his/her managed care company has contracts with or will negotiate with certain facilities, including the following:            Patient/family informed of bed offers received.  Patient chooses bed at       Physician recommends and patient chooses bed at      Patient to be transferred to   on  .  Patient to be transferred to facility by       Patient family notified on   of transfer.  Name of family member notified:        PHYSICIAN Please sign FL2     Additional Comment:    _______________________________________________ Candie Chroman, LCSW 04/30/2017, 2:48  PM

## 2017-04-30 NOTE — Care Management Note (Signed)
Case Management Note  Patient Details  Name: Alexandra House MRN: [id number] Date of Birth: [birth year]  Subjective/Objective:   Pt admitted with AMS                 Action/Plan:   PTA from home with husband.  Pt is intubated   Expected Discharge Date:                  Expected Discharge Plan:     In-House Referral:     Discharge planning Services  CM Consult  Post Acute Care Choice:    Choice offered to:     DME Arranged:    DME Agency:     HH Arranged:    HH Agency:     Status of Service:     If discussed at [employer], dates discussed:    Additional Comments: 04/30/2017 Discussed in LOS 7/17 - pt remains appropriate for continued stay.  Pt extubated 7/16.  CSW consulted for SNF placement Maryclare Labrador, RN 04/30/2017, 3:07 PM

## 2017-05-01 DIAGNOSIS — R5381 Other malaise: Secondary | ICD-10-CM

## 2017-05-01 LAB — CULTURE, BLOOD (ROUTINE X 2)
CULTURE: NO GROWTH
CULTURE: NO GROWTH
SPECIAL REQUESTS: ADEQUATE
SPECIAL REQUESTS: ADEQUATE

## 2017-05-01 LAB — BASIC METABOLIC PANEL
ANION GAP: 10 (ref 5–15)
Anion gap: 7 (ref 5–15)
BUN: 23 mg/dL — ABNORMAL HIGH (ref 6–20)
BUN: 23 mg/dL — AB (ref 6–20)
CHLORIDE: 93 mmol/L — AB (ref 101–111)
CHLORIDE: 94 mmol/L — AB (ref 101–111)
CO2: 33 mmol/L — ABNORMAL HIGH (ref 22–32)
CO2: 34 mmol/L — AB (ref 22–32)
CREATININE: 1.09 mg/dL — AB (ref 0.44–1.00)
Calcium: 6 mg/dL — CL (ref 8.9–10.3)
Calcium: 5.9 mg/dL — CL (ref 8.9–10.3)
Creatinine, Ser: 1.14 mg/dL — ABNORMAL HIGH (ref 0.44–1.00)
GFR calc Af Amer: 54 mL/min — ABNORMAL LOW (ref >60)
GFR calc Af Amer: 57 mL/min — ABNORMAL LOW (ref >60)
GFR calc non Af Amer: 49 mL/min — ABNORMAL LOW (ref >60)
GFR, EST NON AFRICAN AMERICAN: 47 mL/min — AB (ref >60)
Glucose, Bld: 89 mg/dL (ref 65–99)
Glucose, Bld: 97 mg/dL (ref 65–99)
POTASSIUM: 2.8 mmol/L — AB (ref 3.5–5.1)
Potassium: 3.6 mmol/L (ref 3.5–5.1)
SODIUM: 136 mmol/L (ref 135–145)
SODIUM: 135 mmol/L (ref 135–145)

## 2017-05-01 LAB — CBC
HEMATOCRIT: 28.2 % — AB (ref 36.0–46.0)
HEMOGLOBIN: 9.2 g/dL — AB (ref 12.0–15.0)
MCH: 29 pg (ref 26.0–34.0)
MCHC: 32.6 g/dL (ref 30.0–36.0)
MCV: 89 fL (ref 78.0–100.0)
Platelets: 336 10*3/uL (ref 150–400)
RBC: 3.17 MIL/uL — ABNORMAL LOW (ref 3.87–5.11)
RDW: 21.6 % — AB (ref 11.5–15.5)
WBC: 8.7 10*3/uL (ref 4.0–10.5)

## 2017-05-01 LAB — GLUCOSE, CAPILLARY
GLUCOSE-CAPILLARY: 77 mg/dL (ref 65–99)
GLUCOSE-CAPILLARY: 81 mg/dL (ref 65–99)
Glucose-Capillary: 83 mg/dL (ref 65–99)
Glucose-Capillary: 82 mg/dL (ref 65–99)
Glucose-Capillary: 73 mg/dL (ref 65–99)

## 2017-05-01 MED ORDER — POTASSIUM CHLORIDE 20 MEQ/15ML (10%) PO SOLN
40.0000 meq | Freq: Once | ORAL | Status: DC
  Filled 2017-05-01: qty 30

## 2017-05-01 MED ORDER — POTASSIUM CHLORIDE 10 MEQ/100ML IV SOLN
10.0000 meq | INTRAVENOUS | Status: AC
  Administered 2017-05-01 (×4): 10 meq via INTRAVENOUS
  Filled 2017-05-01 (×4): qty 100

## 2017-05-01 MED ORDER — SODIUM CHLORIDE 0.9 % IV BOLUS (SEPSIS)
1000.0000 mL | Freq: Once | INTRAVENOUS | Status: AC
  Administered 2017-05-01: 1000 mL via INTRAVENOUS

## 2017-05-01 MED ORDER — SODIUM CHLORIDE 0.9 % IV BOLUS (SEPSIS)
500.0000 mL | Freq: Once | INTRAVENOUS | Status: AC
  Administered 2017-05-01: 500 mL via INTRAVENOUS

## 2017-05-01 MED ORDER — SODIUM CHLORIDE 0.9 % IV SOLN
1.0000 g | Freq: Once | INTRAVENOUS | Status: AC
  Administered 2017-05-01: 1 g via INTRAVENOUS
  Filled 2017-05-01: qty 10

## 2017-05-01 NOTE — Progress Notes (Signed)
Link Physician-Brief Progress Note Patient Name: [name] DOB: 12-27-1945 MRN: [id number]   Date of Service  05/01/2017  HPI/Events of Note  Multiple issues: 1. Hypotension - BP = 96/46 and CVP = 1 s/p 0.9 NaCl bolus and 2. Ca++ = 5.9.  eICU Interventions  Will order: 1. Bolus with 0.9 NaCl 1 liter IV over 1 hour now.  2. Replace Ca++.     Intervention Category Major Interventions: Hypotension - evaluation and management  Lysle Dingwall 05/01/2017, 6:55 PM

## 2017-05-01 NOTE — Progress Notes (Signed)
SLP Cancellation Note  Patient Details Name: Alexandra House MRN: [id number] DOB: May 22, 1946   Cancelled treatment:        Pt refused drink (Coke) or solids with verbal cues (in room less than 3 minutes). Husband states she would only eat [place] ice last night and no breakfast. Will continue efforts.     [place] Siren 05/01/2017, 9:53 AM  Orbie Pyo Colvin Caroli.Ed [employer] [phone]

## 2017-05-01 NOTE — Progress Notes (Signed)
Rehab Admissions Coordinator Note:  Patient was screened by Retta Diones for appropriateness for an Inpatient Acute Rehab Consult.  At this time, we are recommending Inpatient Rehab consult.  Retta Diones 05/01/2017, 10:55 AM  I can be reached at [phone].

## 2017-05-01 NOTE — Progress Notes (Signed)
PULMONARY / CRITICAL CARE MEDICINE   Name: Alexandra House MRN: [id number] DOB: Dec 13, 1945    ADMISSION DATE:  04/20/2017   REFERRING MD:Dr. Aileen Fass   CHIEF COMPLAINT:AMS, Hypotension, Hypothermia, Bradycardia   HISTORY OF PRESENT ILLNESS: [name] who initially presented with bradycardia in the 30's, hypotension (79/39), tachypnea, hypothermic. She was treated with IVF with improvement in BP. The patient was admitted per [facility] and treated for suspected sepsis. She developed worsening hypotension, bradycardia, altered mental status and increased work of breathing on 7/8. PCCM called back for re-evaluation. Extubated 7/12, and reintubated 7/13 due to resp distress and extubated again on 7/16. Antibiotics discontinued 7/17.   SUBJECTIVE: Continues off pressors, SLP determined dysphagia 3 diet, but remained NPO for remainder of 7/17. Mental status slightly improved overnight. Not participating in PT. Husband wants wife to get admitted to CIR.   VITAL SIGNS: BP 103/72   Pulse 67   Temp (!) 97.5 F (36.4 C) (Oral)   Resp 12   Ht '4\' 10"'  (1.473 m)   Wt 105 lb 6.1 oz (47.8 kg)   SpO2 99%   BMI 22.02 kg/m   HEMODYNAMICS:   INTAKE / OUTPUT: I/O last 3 completed shifts: In: 595 [I.V.:135; IV Piggyback:460] Out: 2640 [Urine:2640]  PHYSICAL EXAMINATION: General: [name] sitting up in bed comfortable and in NAD Neuro: alert only to herself, confused, moves all extremities HEENT: PERRL, MMM Cardiovascular: RRR, no MRG Lungs: CTABL Abdomen: +BS, soft, NTND Musculoskeletal: No gross deformities Ext: edema in upper and lower extremities  LABS:  BMET  Recent Labs Lab 04/28/17 2110 04/29/17 0410 04/30/17 0358  NA 132* 133* 135  K 3.5 3.5 2.9*  CL 95* 93* 91*  CO2 '29 28 28  ' BUN 32* 28* 27*  CREATININE 1.58* 1.50* 1.37*  GLUCOSE 95 100* 111*    Electrolytes  Recent Labs Lab 04/28/17 0357 04/28/17 2110 04/29/17 0410 04/30/17 0358  CALCIUM 6.5* 6.5* 6.3*  5.8*  MG 1.5* 2.5* 2.3 1.8  PHOS 3.0 3.2 3.5  --     CBC  Recent Labs Lab 04/29/17 0410 04/30/17 0358 05/01/17 0332  WBC 14.1* 9.5 8.7  HGB 9.7* 9.4* 9.2*  HCT 30.5* 29.5* 28.2*  PLT 499* 371 336    Coag's No results for input(s): APTT, INR in the last 168 hours.  Sepsis Markers  Recent Labs Lab 04/25/17 1157 04/26/17 0230 04/26/17 0657 04/26/17 1539 04/29/17 1823 04/30/17 0358  LATICACIDVEN  --  6.0* 7.6* 2.5*  --   --   PROCALCITON 0.68  --   --   --  0.48 0.32    ABG  Recent Labs Lab 04/26/17 0206 04/26/17 0640 04/26/17 0820  PHART 7.352 7.231* 7.333*  PCO2ART 18.6* 24.4* 25.1*  PO2ART 109.0* 97.4 126*    Liver Enzymes  Recent Labs Lab 04/27/17 0313 04/28/17 0357  AST 510*  --   ALT 261*  --   ALKPHOS 256*  --   BILITOT 0.7  --   ALBUMIN 1.8* 1.9*    Cardiac Enzymes  Recent Labs Lab 04/25/17 0800 04/26/17 0507 04/26/17 0842  TROPONINI 3.45* 2.87* 2.28*    Glucose  Recent Labs Lab 04/30/17 1146 04/30/17 1532 04/30/17 2017 04/30/17 2346 05/01/17 0336 05/01/17 0725  GLUCAP 143* 77 96 93 77 83    Imaging Ct Head Wo Contrast  Result Date: 04/30/2017 CLINICAL DATA:  Acute altered mental status EXAM: CT HEAD WITHOUT CONTRAST TECHNIQUE: Contiguous axial images were obtained from the base of the skull through  the vertex without intravenous contrast. COMPARISON:  None available FINDINGS: Brain: Age related brain atrophy and chronic white matter microvascular ischemic changes throughout both cerebral hemispheres. Mild associated ventricular enlargement. No acute intracranial hemorrhage, definite new infarction, mass lesion, midline shift, herniation, hydrocephalus, or extra-axial fluid collection. No focal mass effect or edema. Cisterns are patent. Cerebellar atrophy as well. Vascular: Intracranial atherosclerosis at the skullbase. No hyperdense vessel. Skull: Normal. Negative for fracture or focal lesion. Sinuses/Orbits: No acute finding.  Other: None. IMPRESSION: Brain atrophy and chronic white matter microvascular ischemic changes. No acute intracranial abnormality by noncontrast CT. Electronically Signed   By: Jerilynn Mages.  Shick M.D.   On: 04/30/2017 13:04     STUDIES: 7/05 [place] ABD >>abdominal US on 7/5 which was negative for gallstones, hepatic abnormality, normal pancreas &no hydronephrosis.  7/7 CT chest/abdomen/pelvis> emphysema, mult-lobar pneumonia, fatty liver, fat stranding around head of pancreas 7/13 CT chest/abdomen/pelvis> Moderate B/L effusions, patchy air space disease at bases. Anasarca 7/16 CXR>Diffuse bilateral pulmonary infiltrates and small bilateral pleural effusions suggesting mild CHF.  CULTURES: BCx2 7/7>> final negative UC 7/7 >>final negative BCX 7/10>>NGTD x2 days resp culture 7/10 >> few candida albicans BCX 7/13>> negative Ucx 7/13>> negative Sputum cx>> negative  ANTIBIOTICS: Zosyn 7/7 >>7/13 Vanco 7/7 >>7/10 Vancomycin PO 7/13>> 7/17 Zyvox 7/14 >> 7/17 Meropenem 7/14>> 7/17  SIGNIFICANT EVENTS: 7/07 Admit with N/V, weakness, hypotension  7/8 >> intubated 7/12>> extubated 7/13>> intubated 7/16>> extubated  7/17>> new onset acute confusion  LINES/TUBES: ETT 7/8 >>7/12 ETT 7/13>> 7/16 CVC LIJ 7/8 >> Urethral cath 7/8>> NGT 7/8>>  DISCUSSION: 71 y/o female previously with septic shock, aspiration and elevated troponin likely 2/2 demand ischemia vs: NSTEMI. Extubated  Improved leukocytosis and afebrile. AMS slight improved from yesterday.  Ok for transfer to SDU today.   ASSESSMENT / PLAN:  PULMONARY A: Acute Respiratory Failure Centrilobular Emphysema  Aspiration PNA, completed treatment P: Satting appropriately 2LNC Brovana/pulmicort, mucinex Continue duobnebs  CARDIOVASCULAR A:  Bradycardia, with junctional escape rhythm, stable Elevated Troponin - suspect demand ischemia Hx Grade II Diastolic Dysfunction, LVEF Hx Carotid Stenosis  Shock:  presumably septic vs cardiogenic P: Echo EF 45-50%, PA peak pressure: 38 mm Hg (S). Cont ASA Cardiology following; recommending further workup after complete improvement; possibly after d/c  RENAL A:  AKI Hypokalemia, resolved Hypocalcemia: 6.0 K: 2.9>2.8 P: Continue lasix 29m IV daily KCl 145m x4 and 4027mPO Continue to monitor UOP AM BMP  GASTROINTESTINAL A:  Nausea / Vomiting, Weight Loss - ? If related to Prolia  Elevated alk phos without evidence of biliary obstruction Indeterminate C. Diff SLP eval>> dysphagia 3 diet P: zofran PRN nausea protonix 1m41mily See ID  HEMATOLOGIC A:  Anemia, stable. hgb to 9.2 P: Monitor for bleeding Transfusion threshold <8 Lovenox  INFECTIOUS A:  Aspiration pneumonia On empiric treatment for C diff, improving P: Off antibiotics  ENDOCRINE A:  Hyperglycemia  P: SSI  NEUROLOGIC A:  Acute confusion, still confused. Not participating in PT.  P: CT head no acute abnormalities ativan 1mg 46m PRN   FAMILY - Updates: Updated husband and son at bedside at length/ 7/15 - Inter-disciplinary family meet or Palliative Care meeting due by: day 7    Pulmonary and CritiSt. Michaelsr: [phone]/2018, 7:56 AM

## 2017-05-01 NOTE — Progress Notes (Signed)
Physical Therapy Treatment Patient Details Name: Alexandra House MRN: [id number] DOB: May 29, 1946 Today's Date: 05/01/2017    History of Present Illness Pt adm with septic shock likely due to aspiration PNA. Pt intubated 7/8 and extubated 7/12. Reintubated 7/13 and extubated 7/16. Pt with bradycardia requiring external pacing on admission. PMH - chf, copd, osteoporosis, depression, syncope    PT Comments    Pt with slow progress. Pt's delirium/cognition is slowing progress significantly. Feel pt could have ambulated further today but due to delirium pt refused to mobilize further. Husband request CIR which could be a good option if pt's delirium/cognition improves.   Follow Up Recommendations  CIR (if pt participation improves)     Equipment Recommendations  Rolling walker with 5" wheels    Recommendations for Other Services       Precautions / Restrictions Precautions Precautions: Fall Restrictions Weight Bearing Restrictions: No    Mobility  Bed Mobility Overal bed mobility: Needs Assistance Bed Mobility: Supine to Sit     Supine to sit: Max assist     General bed mobility comments: Assist to initiate movement. Assist to move legs off bed, elevate trunk into sitting, and bring hips to EOB. More assist needed due to cognition.  Transfers Overall transfer level: Needs assistance Equipment used: Rolling walker (2 wheeled) Transfers: Sit to/from [facility] Sit to Stand: +2 physical assistance;Mod assist Stand pivot transfers: +2 physical assistance;Mod assist       General transfer comment: Assist to bring hips and trunk up. Assist for balance and support with pivotal steps from bed to chair with walker.  Ambulation/Gait Ambulation/Gait assistance: +2 physical assistance;Mod assist Ambulation Distance (Feet): 2 Feet Assistive device: Rolling walker (2 wheeled) Gait Pattern/deviations: Step-through pattern;Decreased step length - right;Decreased step  length - left;Shuffle;Trunk flexed Gait velocity: decr Gait velocity interpretation: Below normal speed for age/gender General Gait Details: Assist for balance and support   Stairs            Wheelchair Mobility    Modified Rankin (Stroke Patients Only)       Balance Overall balance assessment: Needs assistance Sitting-balance support: No upper extremity supported;Feet supported Sitting balance-Leahy Scale: Fair     Standing balance support: Bilateral upper extremity supported Standing balance-Leahy Scale: Poor Standing balance comment: walker and min assist for static standing                            Cognition Arousal/Alertness: Awake/alert Behavior During Therapy: Restless Overall Cognitive Status: Impaired/Different from baseline Area of Impairment: Orientation;Following commands;Safety/judgement;Problem solving;Attention;Memory                   Current Attention Level: Sustained   Following Commands: Follows one step commands inconsistently Safety/Judgement: Decreased awareness of safety;Decreased awareness of deficits   Problem Solving: Requires verbal cues;Requires tactile cues;Slow processing;Decreased initiation General Comments: Pt with delirium. Pt doesn't answer orientation questions. After getting to chair pt refused further mobility and became irritated with further encouragement.       Exercises      General Comments        Pertinent Vitals/Pain Pain Assessment: Faces Faces Pain Scale: No hurt    Home Living                      Prior Function            PT Goals (current goals can now be found in the care  plan section) Progress towards PT goals: Progressing toward goals    Frequency    Min 3X/week      PT Plan Discharge plan needs to be updated    Co-evaluation              AM-PAC PT "6 Clicks" Daily Activity  Outcome Measure  Difficulty turning over in bed (including adjusting  bedclothes, sheets and blankets)?: Total Difficulty moving from lying on back to sitting on the side of the bed? : Total Difficulty sitting down on and standing up from a chair with arms (e.g., wheelchair, bedside commode, etc,.)?: Total Help needed moving to and from a bed to chair (including a wheelchair)?: Total Help needed walking in hospital room?: Total Help needed climbing 3-5 steps with a railing? : Total 6 Click Score: 6    End of Session Equipment Utilized During Treatment: Gait belt;Oxygen Activity Tolerance: Other (comment) (cognition) Patient left: in chair;with call bell/phone within reach;with chair alarm set;with family/visitor present Nurse Communication: Mobility status PT Visit Diagnosis: Unsteadiness on feet (R26.81);Muscle weakness (generalized) (M62.81);Difficulty in walking, not elsewhere classified (R26.2)     Time: 1638-4665 PT Time Calculation (min) (ACUTE ONLY): 19 min  Charges:  $Therapeutic Activity: 8-22 mins                    G Codes:       [facility] 05/01/2017, 8:55 AM

## 2017-05-01 NOTE — Progress Notes (Signed)
I will follow pt's progress, but cognition limiting functional mobility at this time. [phone]

## 2017-05-01 NOTE — Consult Note (Signed)
Physical Medicine and Rehabilitation Consult Reason for Consult: Decreased functional mobility Referring Physician: Dr. Lake Bells   HPI: Alexandra House is a 71 y.o. right handed female with history of COPD/tobacco abuse, osteoporosis, fibromyalgia, history of transient CHB with lexiscan 2013 and syncope. Per chart review and patient lives with spouse independent prior to admission. One level home with 7 steps to entry. Present 04/21/2017 with intermittent bouts of nausea vomiting without abdominal pain she had a recent EGD and abdominal ultrasound which was unremarkable. Reports of a 10 pound weight loss. He noted increasing shortness of breath dyspnea and productive cough as well as hypotension. Findings of acute renal failure 3.09, lactic acid 6.3 and white blood cell count of 27,000. Cardiac biomarkers of 2.4. Placed on broad-spectrum antibiotics. IV fluids to help maintain blood pressure. CT of the chest showed interval development of irregular patchy consolidative opacities throughout the lungs bilaterally. Echocardiogram with ejection fraction of 45%. Hypokinesis of the basal mid anterior septal, basal mid inferior septal myocardium. Cardiology follow-up felt elevated troponin related to acute illness. Required external pacing for bradycardia monitor closely conservative care. Patient did require intubation due to acute respiratory failure and extubated 04/29/2017. Subcutaneous heparin for DVT prophylaxis. Tolerating mechanical soft diet. Renal function has improved nicely with latest creatinine 1.37. Intermittent bouts of restlessness of her mental status with cranial CT scan 04/30/2017 unremarkable for acute changes. Physical and occupational therapy evaluations completed. M.D. has requested physical medicine rehabilitation consult.  According to husband, has been less responsive today. However, according to nurse, patient had an episode where patient became less responsive yesterday as  well. She is awake, alert, occasionally smiling  Review of Systems  Constitutional: Positive for malaise/fatigue and weight loss. Negative for fever.  Eyes: Negative for blurred vision and double vision.  Respiratory: Positive for cough and shortness of breath.   Cardiovascular: Positive for palpitations and leg swelling. Negative for chest pain.  Gastrointestinal: Positive for nausea and vomiting.  Genitourinary: Negative for dysuria, flank pain and hematuria.  Musculoskeletal: Positive for back pain, joint pain and myalgias.  Skin: Negative for rash.  Neurological: Positive for dizziness. Negative for seizures.       Syncope  Psychiatric/Behavioral:       Anxiety  All other systems reviewed and are negative.  Past Medical History:  Diagnosis Date  . Anxiety   . Carotid stenosis   . Fibromyalgia   . HLD (hyperlipidemia)   . Hx of cardiovascular stress test    a. Lex MV 11/13: + significant ECG changes and CHB with infusion; EF 81%, no ischemia,   . Hx of echocardiogram    a. Echo 12/12: EF 22-97%, grade 2 diastolic dysfunction, mild MR.; b. Echo 11/13:  mild LVH, EF 60-65%  . Neuropathy   . Osteoporosis   . Syncope 08/2009   a. ETT-Echo 1/11:  poor ex tol, submax exercise, no WMA or ECG changes c/w ischemia;     Past Surgical History:  Procedure Laterality Date  . None     Family History  Problem Relation Age of Onset  . CAD Mother 43       Status post PCI  . Lung cancer Father   . Colon cancer Neg Hx    Social History:  reports that she has been smoking Cigarettes.  She has been smoking about 0.50 packs per day. She uses smokeless tobacco. She reports that she drinks alcohol. She reports that she does not use drugs. Allergies:  Allergies  Allergen Reactions  . Bee Venom Anaphylaxis and Hives   Medications Prior to Admission  Medication Sig Dispense Refill  . benzonatate (TESSALON) 100 MG capsule Take 100 mg by mouth every 8 (eight) hours as needed for cough.  0   . BREO ELLIPTA 100-25 MCG/INH AEPB Inhale 1 puff into the lungs daily.  1  . budesonide-formoterol (SYMBICORT) 160-4.5 MCG/ACT inhaler Inhale 2 puffs into the lungs as needed.    . calcium carbonate (OS-CAL) 600 MG TABS Take 600 mg by mouth 2 (two) times daily with a meal.    . denosumab (PROLIA) 60 MG/ML SOLN injection Inject 60 mg into the skin every 6 (six) months. Administer in upper arm, thigh, or abdomen    . ENSURE PLUS (ENSURE PLUS) LIQD Take 237 mLs by mouth daily.    Marland Kitchen HYDROcodone-homatropine (HYCODAN) 5-1.5 MG/5ML syrup Take 5 mLs by mouth every 6 (six) hours as needed for cough.     Marland Kitchen LORazepam (ATIVAN) 1 MG tablet Take 1 mg by mouth 2 (two) times daily as needed for anxiety.     . Multiple Vitamins-Minerals (MULTIVITAMIN GUMMIES ADULT PO) Take 2 tablets by mouth daily.    Marland Kitchen omeprazole (PRILOSEC) 40 MG capsule Take 40 mg by mouth daily.  0  . ondansetron (ZOFRAN) 4 MG tablet Take 1 tablet (4 mg total) by mouth every 8 (eight) hours as needed for nausea or vomiting. 12 tablet 0  . ranitidine (ZANTAC) 150 MG capsule Take 1 capsule (150 mg total) by mouth daily. 30 capsule 0  . albuterol (PROVENTIL HFA;VENTOLIN HFA) 108 (90 BASE) MCG/ACT inhaler Inhale 2 puffs into the lungs every 6 (six) hours as needed for wheezing or shortness of breath.     . EPINEPHrine (EPIPEN JR) 0.15 MG/0.3ML injection Inject 0.15 mg into the muscle as needed for anaphylaxis.     Marland Kitchen ibuprofen (ADVIL,MOTRIN) 200 MG tablet Take 200-400 mg by mouth every 6 (six) hours as needed for mild pain.     . pravastatin (PRAVACHOL) 40 MG tablet Take 1 tablet (40 mg total) by mouth every evening. (Patient not taking: Reported on 04/20/2017) 30 tablet 11    Home: Home Living Family/patient expects to be discharged to:: Private residence Living Arrangements: Spouse/significant other Available Help at Discharge: Family, Available 24 hours/day Type of Home: House Home Access: Stairs to enter [employer] of Steps:  7-8 Entrance Stairs-Rails: Right Home Layout: One level Home Equipment: None  Functional History: Prior Function Level of Independence: Independent Functional Status:  Mobility: Bed Mobility Overal bed mobility: Needs Assistance Bed Mobility: Supine to Sit Supine to sit: Max assist General bed mobility comments: Assist to initiate movement. Assist to move legs off bed, elevate trunk into sitting, and bring hips to EOB. More assist needed due to cognition. Transfers Overall transfer level: Needs assistance Equipment used: Rolling walker (2 wheeled) Transfers: Sit to/from Stand, Stand Pivot Transfers Sit to Stand: +2 physical assistance, Mod assist Stand pivot transfers: +2 physical assistance, Mod assist General transfer comment: Assist to bring hips and trunk up. Assist for balance and support with pivotal steps from bed to chair with walker. Ambulation/Gait Ambulation/Gait assistance: +2 physical assistance, Mod assist Ambulation Distance (Feet): 2 Feet Assistive device: Rolling walker (2 wheeled) Gait Pattern/deviations: Step-through pattern, Decreased step length - right, Decreased step length - left, Shuffle, Trunk flexed General Gait Details: Assist for balance and support Gait velocity: decr Gait velocity interpretation: Below normal speed for age/gender    ADL:    Cognition: Cognition  Overall Cognitive Status: Impaired/Different from baseline Orientation Level: Oriented to person Cognition Arousal/Alertness: Awake/alert Behavior During Therapy: Restless Overall Cognitive Status: Impaired/Different from baseline Area of Impairment: Orientation, Following commands, Safety/judgement, Problem solving, Attention, Memory Current Attention Level: Sustained Following Commands: Follows one step commands inconsistently Safety/Judgement: Decreased awareness of safety, Decreased awareness of deficits Problem Solving: Requires verbal cues, Requires tactile cues, Slow  processing, Decreased initiation General Comments: Pt with delirium. Pt doesn't answer orientation questions. After getting to chair pt refused further mobility and became irritated with further encouragement.   Blood pressure (!) 111/50, pulse 62, temperature (!) 97.5 F (36.4 C), temperature source Oral, resp. rate 12, height 4\' 10"  (1.473 m), weight 47.8 kg (105 lb 6.1 oz), SpO2 98 %. Physical Exam  Constitutional:  71 year old right-handed frail female  HENT:  Head: Normocephalic.  Eyes: EOM are normal.  Neck: Normal range of motion. Neck supple. No thyromegaly present.  Cardiovascular: Normal rate and regular rhythm.   Respiratory: Effort normal and breath sounds normal.  GI: Soft. Bowel sounds are normal. She exhibits no distension.  Neurological:  Patient is alert , nonverbal, except during exam. During sensory testing exclaimed "stop that or I'll knock the  __ out of you"  Did not cooperate well with motor testing. She does squeeze with both hands stronger on the right than on the left. Would grade left side. Overall at 4 compared to the right side at 5 Did not cooperate with more proximal testing or with lower extremity testing. Is able to wiggle her toes bilaterally but more movement on the right side than left side.  Skin: Skin is warm and dry.    Results for orders placed or performed during the hospital encounter of 04/20/17 (from the past 24 hour(s))  Glucose, capillary     Status: Abnormal   Collection Time: 04/30/17 11:46 AM  Result Value Ref Range   Glucose-Capillary 143 (H) 65 - 99 mg/dL   Comment 1 Document in Chart   Glucose, capillary     Status: None   Collection Time: 04/30/17  3:32 PM  Result Value Ref Range   Glucose-Capillary 77 65 - 99 mg/dL  Glucose, capillary     Status: None   Collection Time: 04/30/17  8:17 PM  Result Value Ref Range   Glucose-Capillary 96 65 - 99 mg/dL   Comment 1 Notify RN   Glucose, capillary     Status: None   Collection Time:  04/30/17 11:46 PM  Result Value Ref Range   Glucose-Capillary 93 65 - 99 mg/dL   Comment 1 Notify RN   CBC     Status: Abnormal   Collection Time: 05/01/17  3:32 AM  Result Value Ref Range   WBC 8.7 4.0 - 10.5 K/uL   RBC 3.17 (L) 3.87 - 5.11 MIL/uL   Hemoglobin 9.2 (L) 12.0 - 15.0 g/dL   HCT 28.2 (L) 36.0 - 46.0 %   MCV 89.0 78.0 - 100.0 fL   MCH 29.0 26.0 - 34.0 pg   MCHC 32.6 30.0 - 36.0 g/dL   RDW 21.6 (H) 11.5 - 15.5 %   Platelets 336 150 - 400 K/uL  Glucose, capillary     Status: None   Collection Time: 05/01/17  3:36 AM  Result Value Ref Range   Glucose-Capillary 77 65 - 99 mg/dL   Comment 1 Venous Specimen   Glucose, capillary     Status: None   Collection Time: 05/01/17  7:25 AM  Result Value Ref Range  Glucose-Capillary 83 65 - 99 mg/dL   Comment 1 Capillary Specimen    Comment 2 Notify RN    Ct Head Wo Contrast  Result Date: 04/30/2017 CLINICAL DATA:  Acute altered mental status EXAM: CT HEAD WITHOUT CONTRAST TECHNIQUE: Contiguous axial images were obtained from the base of the skull through the vertex without intravenous contrast. COMPARISON:  None available FINDINGS: Brain: Age related brain atrophy and chronic white matter microvascular ischemic changes throughout both cerebral hemispheres. Mild associated ventricular enlargement. No acute intracranial hemorrhage, definite new infarction, mass lesion, midline shift, herniation, hydrocephalus, or extra-axial fluid collection. No focal mass effect or edema. Cisterns are patent. Cerebellar atrophy as well. Vascular: Intracranial atherosclerosis at the skullbase. No hyperdense vessel. Skull: Normal. Negative for fracture or focal lesion. Sinuses/Orbits: No acute finding. Other: None. IMPRESSION: Brain atrophy and chronic white matter microvascular ischemic changes. No acute intracranial abnormality by noncontrast CT. Electronically Signed   By: Jerilynn Mages.  Shick M.D.   On: 04/30/2017 13:04    Assessment/Plan: Diagnosis:  Deconditioning after sepsis, probable metabolic encephalopathy 1. Does the need for close, 24 hr/day medical supervision in concert with the patient's rehab needs make it unreasonable for this patient to be served in a less intensive setting? No 2. Co-Morbidities requiring supervision/potential complications: Acute renal failure, COPD, fibromyalgia 3. Due to bladder management, bowel management, safety, skin/wound care, disease management, medication administration, pain management and patient education, does the patient require 24 hr/day rehab nursing? Potentially 4. Does the patient require coordinated care of a physician, rehab nurse, PT, OT, speech to address physical and functional deficits in the context of the above medical diagnosis(es)? Potentially Addressing deficits in the following areas: balance, endurance, locomotion, strength, transferring, bowel/bladder control, bathing, dressing, feeding, grooming, toileting, cognition, speech, language, swallowing and psychosocial support 5. Can the patient actively participate in an intensive therapy program of at least 3 hrs of therapy per day at least 5 days per week? No 6. The potential for patient to make measurable gains while on inpatient rehab is poor 7. Anticipated functional outcomes upon discharge from inpatient rehab are n/a  with PT, n/a with OT, n/a with SLP. 8. Estimated rehab length of stay to reach the above functional goals is: Not applicable 9. Anticipated D/C setting: Home 10. Anticipated post D/C treatments: [place] therapy 11. Overall Rehab/Functional Prognosis: fair  RECOMMENDATIONS: This patient's condition is appropriate for continued rehabilitative care in the following setting: SNF Patient has agreed to participate in recommended program. N/A Note that insurance prior authorization may be required for reimbursement for recommended care.  Comment: We'll follow. If patient's mental status improves, she may become more  appropriate for inpatient rehabilitation. Can be justified from a medical standpoint.  Participation due to mental status is the main issue at current time  Charlett Blake M.D. [facility] FAAPM&R (Sports Med, Neuromuscular Med) Diplomate Am Board of Electrodiagnostic Med  Cathlyn Parsons., PA-C 05/01/2017

## 2017-05-01 NOTE — Progress Notes (Signed)
Link Physician-Brief Progress Note Patient Name: Alexandra House DOB: 12-27-1945 MRN: [id number]   Date of Service  05/01/2017/  HPI/Events of Note  Hypotension - BP = 80/47 with MAP + 59. Last LVEF = 45% to 50%.  eICU Interventions  Will order: 1. Hold transfer to Telemetry. 2. Bolus with 0.9 NaCl 500 mL IV over 30 minutes now. 3. Monitor CVP.     Intervention Category Major Interventions: Hypotension - evaluation and management  Lysle Dingwall 05/01/2017, 4:37 PM

## 2017-05-02 DIAGNOSIS — I5042 Chronic combined systolic (congestive) and diastolic (congestive) heart failure: Secondary | ICD-10-CM

## 2017-05-02 DIAGNOSIS — E876 Hypokalemia: Secondary | ICD-10-CM

## 2017-05-02 DIAGNOSIS — I272 Pulmonary hypertension, unspecified: Secondary | ICD-10-CM

## 2017-05-02 DIAGNOSIS — J432 Centrilobular emphysema: Secondary | ICD-10-CM

## 2017-05-02 LAB — BLOOD GAS, ARTERIAL
ACID-BASE EXCESS: 6 mmol/L — AB (ref 0.0–2.0)
Bicarbonate: 29.3 mmol/L — ABNORMAL HIGH (ref 20.0–28.0)
DRAWN BY: 129711
O2 Content: 2 L/min
O2 SAT: 99.5 %
PCO2 ART: 37.2 mmHg (ref 32.0–48.0)
Patient temperature: 98.6
pH, Arterial: 7.507 — ABNORMAL HIGH (ref 7.350–7.450)
pO2, Arterial: 114 mmHg — ABNORMAL HIGH (ref 83.0–108.0)

## 2017-05-02 LAB — COMPREHENSIVE METABOLIC PANEL
ALBUMIN: 2 g/dL — AB (ref 3.5–5.0)
ALT: 61 U/L — ABNORMAL HIGH (ref 14–54)
ANION GAP: 10 (ref 5–15)
AST: 46 U/L — ABNORMAL HIGH (ref 15–41)
Alkaline Phosphatase: 143 U/L — ABNORMAL HIGH (ref 38–126)
BILIRUBIN TOTAL: 1 mg/dL (ref 0.3–1.2)
BUN: 14 mg/dL (ref 6–20)
CO2: 29 mmol/L (ref 22–32)
Calcium: 6.8 mg/dL — ABNORMAL LOW (ref 8.9–10.3)
Chloride: 100 mmol/L — ABNORMAL LOW (ref 101–111)
Creatinine, Ser: 0.88 mg/dL (ref 0.44–1.00)
GFR calc Af Amer: >60 mL/min (ref >60)
GLUCOSE: 86 mg/dL (ref 65–99)
POTASSIUM: 4 mmol/L (ref 3.5–5.1)
Sodium: 139 mmol/L (ref 135–145)
TOTAL PROTEIN: 4.9 g/dL — AB (ref 6.5–8.1)

## 2017-05-02 LAB — CBC
HCT: 30 % — ABNORMAL LOW (ref 36.0–46.0)
Hemoglobin: 9.4 g/dL — ABNORMAL LOW (ref 12.0–15.0)
MCH: 28.3 pg (ref 26.0–34.0)
MCHC: 31.3 g/dL (ref 30.0–36.0)
MCV: 90.4 fL (ref 78.0–100.0)
PLATELETS: 295 10*3/uL (ref 150–400)
RBC: 3.32 MIL/uL — AB (ref 3.87–5.11)
RDW: 22.3 % — ABNORMAL HIGH (ref 11.5–15.5)
WBC: 10.4 10*3/uL (ref 4.0–10.5)

## 2017-05-02 LAB — RETICULOCYTES
RBC.: 3.43 MIL/uL — ABNORMAL LOW (ref 3.87–5.11)
RETIC CT PCT: 1.9 % (ref 0.4–3.1)
Retic Count, Absolute: 65.2 10*3/uL (ref 19.0–186.0)

## 2017-05-02 LAB — IRON AND TIBC
IRON: 165 ug/dL (ref 28–170)
SATURATION RATIOS: 79 % — AB (ref 10.4–31.8)
TIBC: 210 ug/dL — AB (ref 250–450)
UIBC: 45 ug/dL

## 2017-05-02 LAB — BASIC METABOLIC PANEL
Anion gap: 8 (ref 5–15)
BUN: 19 mg/dL (ref 6–20)
CO2: 30 mmol/L (ref 22–32)
CREATININE: 0.9 mg/dL (ref 0.44–1.00)
Calcium: 6.6 mg/dL — ABNORMAL LOW (ref 8.9–10.3)
Chloride: 99 mmol/L — ABNORMAL LOW (ref 101–111)
Glucose, Bld: 76 mg/dL (ref 65–99)
POTASSIUM: 3.1 mmol/L — AB (ref 3.5–5.1)
SODIUM: 137 mmol/L (ref 135–145)

## 2017-05-02 LAB — GLUCOSE, CAPILLARY
GLUCOSE-CAPILLARY: 77 mg/dL (ref 65–99)
GLUCOSE-CAPILLARY: 76 mg/dL (ref 65–99)
GLUCOSE-CAPILLARY: 85 mg/dL (ref 65–99)
Glucose-Capillary: 83 mg/dL (ref 65–99)
Glucose-Capillary: 85 mg/dL (ref 65–99)
Glucose-Capillary: 67 mg/dL (ref 65–99)

## 2017-05-02 LAB — VITAMIN B12: VITAMIN B 12: 1141 pg/mL — AB (ref 180–914)

## 2017-05-02 LAB — FERRITIN: Ferritin: 375 ng/mL — ABNORMAL HIGH (ref 11–307)

## 2017-05-02 LAB — ALBUMIN: Albumin: 2.1 g/dL — ABNORMAL LOW (ref 3.5–5.0)

## 2017-05-02 LAB — LACTATE DEHYDROGENASE: LDH: 304 U/L — ABNORMAL HIGH (ref 98–192)

## 2017-05-02 LAB — FOLATE: FOLATE: 11.8 ng/mL (ref >5.9)

## 2017-05-02 MED ORDER — DEXTROSE-NACL 5-0.45 % IV SOLN
INTRAVENOUS | Status: DC
  Administered 2017-05-02: 23:00:00 via INTRAVENOUS

## 2017-05-02 MED ORDER — IPRATROPIUM-ALBUTEROL 0.5-2.5 (3) MG/3ML IN SOLN
3.0000 mL | Freq: Four times a day (QID) | RESPIRATORY_TRACT | Status: DC
  Administered 2017-05-02 (×2): 3 mL via RESPIRATORY_TRACT
  Filled 2017-05-02 (×2): qty 3

## 2017-05-02 MED ORDER — POTASSIUM CHLORIDE 10 MEQ/100ML IV SOLN
10.0000 meq | INTRAVENOUS | Status: AC
  Administered 2017-05-02 (×3): 10 meq via INTRAVENOUS
  Filled 2017-05-02 (×3): qty 100

## 2017-05-02 MED ORDER — POTASSIUM CHLORIDE 10 MEQ/100ML IV SOLN
10.0000 meq | INTRAVENOUS | Status: AC
  Administered 2017-05-02 (×2): 10 meq via INTRAVENOUS

## 2017-05-02 MED ORDER — INSULIN ASPART 100 UNIT/ML ~~LOC~~ SOLN: 1.0000 [IU] | Freq: Three times a day (TID) | SUBCUTANEOUS | Status: DC

## 2017-05-02 MED ORDER — ENSURE ENLIVE PO LIQD
237.0000 mL | Freq: Three times a day (TID) | ORAL | Status: DC
  Administered 2017-05-02: 237 mL via ORAL

## 2017-05-02 MED ORDER — PANTOPRAZOLE SODIUM 40 MG PO PACK
40.0000 mg | PACK | Freq: Every day | ORAL | Status: DC
  Administered 2017-05-02: 40 mg
  Filled 2017-05-02: qty 20

## 2017-05-02 MED ORDER — IPRATROPIUM-ALBUTEROL 0.5-2.5 (3) MG/3ML IN SOLN
3.0000 mL | Freq: Two times a day (BID) | RESPIRATORY_TRACT | Status: DC
  Administered 2017-05-03 – 2017-05-07 (×8): 3 mL via RESPIRATORY_TRACT
  Filled 2017-05-02 (×9): qty 3

## 2017-05-02 NOTE — Progress Notes (Addendum)
SNF is recommended at this time. I will alert [phone]

## 2017-05-02 NOTE — Progress Notes (Signed)
Pt is incontinent twice, bed wet, unable to keep the track of Intake output, last bed wet was 12pm, bladder scan shows 450cc urine now, will do in/out cath shortly.

## 2017-05-02 NOTE — Progress Notes (Signed)
Pt is alert and oriented times 1, Husband is in bed side. Oxygen is continue, suction is done through wall suction, pt is producing cough, will continue to monitor

## 2017-05-02 NOTE — Consult Note (Signed)
   [facility] CM Inpatient Consult   05/02/2017  Alexandra House 1946-08-24 [id number]    Patient was screened for [name] services for multiple ED visit and ELOS. Patient is currently being assessed for CIR verse SNF for disposition needs due to cognition with ongoing delirium per progress notes.   Chart review reveals the patient has [name] beneficiary. Admitted with septic shock due to probable aspiration pneumonia requiring intubation.  Patient is currently exrtubated with PMH of HF, andCOPD.  Wll follow for disposition needs and progression.  No community care management needs noted at this time and likely for some post hospital rehab needed.   Please contact for further questions,  Natividad Brood, RN BSN [facility] Liaison  [phone] business mobile phone Toll free office [phone]

## 2017-05-02 NOTE — Progress Notes (Signed)
RT note-Dr. Sherral Hammers called concerning congestive cough, NTS order, patient tolerated fairly well for moderate clear secretions. Family concerned about not swallowing well. Continue to monitor.

## 2017-05-02 NOTE — Clinical Social Work Note (Signed)
CSW provided patient's husband with bed offers: [facility], [facility], [facility], and [facility]. Patient's husband is not familiar with any of these facilities. CSW acknowledged that his first preference is CIR but encouraged him to do research/tour facilities to choose one as a backup plan if patient is unable to admit to CIR. Patient's husband expressed understanding.  [place]ton Scrape, [place]

## 2017-05-02 NOTE — Progress Notes (Signed)
PROGRESS NOTE    Alexandra House  HBZ:[id number] DOB: 1946-04-09 DOA: 04/20/2017 PCP: Marton Redwood, MD   Brief Narrative:  71 y.o. WF PMHx Depression,Anxiety, FibromyalgiaTobacco abuse with COPD, Osteoporosis, Dyslipidemia, Chronic Systolic and Diastolic CHF, Bilateral Carotid stenosis, Syncope,   For the last 4 weeks patient has developed intractable nausea and vomiting without abdominal pain. She has been unable to keep down anything that she has eaten or drunk. She is not having dysphagia. She is unable to keep medications down. She has been to the ER on several occasions with no definitive etiology found and has briefly improved after the administration of IV fluids. She has undergone outpatient ultrasound of the abdomen and EGD which are also unrevealing. She is not had any fevers or chills. She's had a weight loss of at least 10 pounds over 4 weeks. She's not had any blood in her emesis. Since developing the symptoms she has lost the urge to smoke. Husband reports that for 6 months prior to the symptoms patient's activity tolerance has markedly decreased and she primarily complained of dyspnea on exertion noting she was no longer able to do activities she likes such as going shopping.   Today she presents to the ER with symptoms as above. She was noted to be hypothermic, tachypneic, bradycardic with pulses as low as 32 bpm, initial blood pressure was 79/39 with an MAP of 48. She was not hypoxemic. She had an acute kidney injury with a creatinine of 3.09. Lactic acid was elevated at 6.39. White count was 27,700 with left shift, platelets were also elevated at 709,000. Troponin elevated at 2.05. Clinically she appears significantly dehydrated. She had poor capillary refill and her color was poor noting she was quite pale in appearance. She has received 1200 mL normal saline bolus in the ER her blood pressure has rebounded to 107/35 and she is now receiving an additional 500 mL saline bolus followed by  IV fluids at 150 mL/hr. and warming blanket history is benign applied. PCCM was consulted and felt the patient was appropriate to be admitted to stepdown unit. IV Levophed is available at the bedside in the event patient's blood pressure decreases again after warming. Discussed extensively with husband and patient is a DO NOT RESUSCITATE although patient/has been agreeable with short-term pressors and utilization of BiPAP if necessary. Patient is awake and oriented. Because of bradycardia and abnormal troponin and cardiology has been consulted by the EDP.   Subjective: 7/19 A/O 0, mumbles incoherently, does not follow commands, withdraws to painful stimuli    Assessment & Plan:   Principal Problem:   Hypotension Active Problems:   Severe dehydration   Intractable nausea and vomiting   AKI (acute kidney injury) ([place])   Sinus bradycardia   Hypothermia   HLD (hyperlipidemia)   COPD (chronic obstructive pulmonary disease) (HCC)   Tobacco abuse   Elevated lactic acid level   Elevated troponin   Carotid stenosis, bilateral   Severe sepsis with septic shock (HCC)   NSTEMI (non-ST elevated myocardial infarction) ([place])   Acute respiratory failure with hypoxemia (HCC)   Aspiration pneumonia of both lungs due to vomit ([place])   Septic shock (HCC)   Hypokalemia   Hypocalcemia   Encounter for intubation   Acute encephalopathy   Acute encephalopathy -Patient minimally responsive, will not/cannot answer questions, will not open her eyes. Will not follow commands -ABG pending -NPO secondary to altered mental status  Acute Respiratory Failure with hypoxia/Centrilobular Emphysema/Aspiration PNA -Completed course of antibiotics -  completed treatment -Patient minimally responsive unable to perform pulmonary toilet. -Physiotherapy vest TID -DuoNeb QID -Titrate O2 to maintain SPO2 89-93% -Profound/Pulmicort  Tobacco abuse -Per sister or continues to smoke whenever she has a  chance.  Chronic Systolic and Diastolic CHF -Echocardiogram 2 consistent with CHF see results below -Strict I&O since admission +10.7 L -Daily weight Filed Weights   04/30/17 0400 05/01/17 0500 05/02/17 0502  Weight: 106 lb 4.2 oz (48.2 kg) 105 lb 6.1 oz (47.8 kg) 110 lb 4.8 oz (50 kg)  -Cont ASA -Cardiology following; recommending further workup after complete improvement; possibly after d/c -Transfuse for hemoglobin<8  Bradycardia, with junctional escape rhythm, stable -resolved  Pulmonary hypertension -See CHF  Elevated troponin -Suspect demand ischemia  Anemia -Anemia panel pending  Acute renal failure -Resolved  Nausea / Vomiting, Weight Loss  -Nothing by mouth secondary to altered mental status  Hypokalemia -Potassium goal> 4 -Potassium IV 50 mEq  Hypocalcemia: 6.0 -True hypocalcemia? Obtain CMP -TSH 4.8, borderline for subacute hypothyroidism unfortunately during acute illness T3/T4 of no use -Hypocalcemia, hypothyroidism could cause GI symptoms.    DVT prophylaxis: Subcutaneous heparin Code Status: Full Family Communication: Sister at bedside Disposition Plan: TBD   Consultants:  [facility] M    Procedures/Significant Events:  7/05 [place] ABD >>abdominal US on 7/5 which was negative for gallstones, hepatic abnormality, normal pancreas &no hydronephrosis.  7/07 Admit with N/V, weakness, hypotension   7/7 CT chest/abdomen/pelvis> emphysema, mult-lobar pneumonia, fatty liver, fat stranding around head of pancreas 7/8 Echocardiogram: LVEF=45%.-Hypokinesis of the basal-mid anteroseptal, basal-mid inferoseptal, basal-mid inferior, and   apical septal myocardium. - Mitral valve: moderate regurgitation. 7/8 >> intubated 7/12>> extubated 7/13 CT chest/abdomen/pelvis> Moderate B/L effusions, patchy air space disease at bases. Anasarca 7/13>> intubated 7/14 Echocardiogram: LVEF= 45% to 50%. There is akinesis of the basal-midinferoseptal  myocardium. There is  akinesis of the basal-midinferior myocardium.  - Mitral valve: moderate regurgitation  - Pulmonary arteries: PA peak pressure: 38 mm Hg (S). 7/16 CXR>Diffuse bilateral pulmonary infiltrates and small bilateral pleural effusions suggesting mild CHF. 7/16>> extubated 7/17 CT head wo contrast: No acute intracranial hemorrhage, definite new infarction, mass lesion, midline shift, herniation, hydrocephalus,  7/17>> new onset acute confusion    VENTILATOR SETTINGS: None   Cultures BCx2 7/7>> final negative UC 7/7 >>final negative BCX 7/10>>NGTD x2 days resp culture 7/10 >> few candida albicans BCX 7/13>> negative Ucx 7/13>> negative Sputum cx>> negative      Antimicrobials: Anti-infectives    Start     Stop   04/28/17 1800  vancomycin (VANCOCIN) 50 mg/mL oral solution 500 mg  Status:  Discontinued     04/30/17 1037   04/26/17 1200  meropenem (MERREM) 1 g in sodium chloride 0.9 % 100 mL IVPB  Status:  Discontinued     04/30/17 1039   04/26/17 1200  linezolid (ZYVOX) IVPB 600 mg  Status:  Discontinued     04/30/17 1039   04/26/17 0700  vancomycin (VANCOCIN) 50 mg/mL oral solution 500 mg  Status:  Discontinued     04/28/17 1634   04/24/17 1400  piperacillin-tazobactam (ZOSYN) IVPB 3.375 g  Status:  Discontinued     04/26/17 1150   04/22/17 0445  vancomycin (VANCOCIN) 500 mg in sodium chloride 0.9 % 100 mL IVPB  Status:  Discontinued     04/24/17 0918   04/20/17 2000  piperacillin-tazobactam (ZOSYN) IVPB 2.25 g  Status:  Discontinued     04/24/17 0807   04/20/17 1130  piperacillin-tazobactam (ZOSYN) IVPB 3.375 g  04/20/17 1346   04/20/17 1130  vancomycin (VANCOCIN) IVPB 1000 mg/200 mL premix     04/20/17 1347       Devices   LINES / TUBES:  ETT 7/8 >>7/12 ETT 7/13>>7/16 CVC LIJ 7/8 >> Urethral cath 7/8>> NGT 7/8>>    Continuous Infusions: . sodium chloride 10 mL/hr at 04/30/17 0144     Objective: Vitals:   05/02/17 0348 05/02/17 0400 05/02/17 0502  05/02/17 0845  BP:  122/65    Pulse:  66    Resp:  14    Temp: 98.2 F (36.8 C)     TempSrc: Axillary     SpO2:  100%  97%  Weight:   110 lb 4.8 oz (50 kg)   Height:   5\' 2"  (1.575 m)     Intake/Output Summary (Last 24 hours) at 05/02/17 0901 Last data filed at 05/02/17 0300  Gross per 24 hour  Intake             2390 ml  Output              485 ml  Net             1905 ml   Filed Weights   04/30/17 0400 05/01/17 0500 05/02/17 0502  Weight: 106 lb 4.2 oz (48.2 kg) 105 lb 6.1 oz (47.8 kg) 110 lb 4.8 oz (50 kg)    Examination:  General: A/O 0, mumbles incoherently, does not follow commands, withdraws to painful stimuli No acute respiratory distress, cachectic Eyes: negative scleral hemorrhage, negative anisocoria, negative icterus ENT: Negative Runny nose, negative gingival bleeding, Neck:  Negative scars, masses, torticollis, lymphadenopathy, JVD Lungs: diffuse rhonchi, with by basilar decreased sounds, negative wheezes or crackles Cardiovascular: Regular rate and rhythm without murmur gallop or rub normal S1 and S2 Abdomen: negative abdominal pain, nondistended, positive soft, bowel sounds, no rebound, no ascites, no appreciable mass Extremities: No significant cyanosis, clubbing, or edema bilateral lower extremities Skin: Negative rashes, lesions, ulcers Psychiatric:  Unable to evaluate secondary to altered mental status  Central nervous system:  Unable to evaluate secondary to altered mental status mumbles incoherently, withdraws to painful stimuli.   .     Data Reviewed: Care during the described time interval was provided by me .  I have reviewed this patient's available data, including medical history, events of note, physical examination, and all test results as part of my evaluation. I have personally reviewed and interpreted all radiology studies.  CBC:  Recent Labs Lab 04/28/17 0357 04/29/17 0410 04/30/17 0358 05/01/17 0332 05/02/17 0550  WBC 22.7* 14.1*  9.5 8.7 10.4  HGB 9.3* 9.7* 9.4* 9.2* 9.4*  HCT 28.1* 30.5* 29.5* 28.2* 30.0*  MCV 85.7 88.2 88.6 89.0 90.4  PLT 550* 499* 371 336 062   Basic Metabolic Panel:  Recent Labs Lab 04/26/17 1330  04/28/17 0357 04/28/17 2110 04/29/17 0410 04/30/17 0358 05/01/17 0902 05/01/17 1606 05/02/17 0550  NA  --   < > 135 132* 133* 135 136 135 137  K  --   < > 3.0* 3.5 3.5 2.9* 2.8* 3.6 3.1*  CL  --   < > 95* 95* 93* 91* 93* 94* 99*  CO2  --   < > 28 29 28 28  33* 34* 30  GLUCOSE  --   < > 93 95 100* 111* 89 97 76  BUN  --   < > 30* 32* 28* 27* 23* 23* 19  CREATININE  --   < >  1.62* 1.58* 1.50* 1.37* 1.14* 1.09* 0.90  CALCIUM  --   < > 6.5* 6.5* 6.3* 5.8* 6.0* 5.9* 6.6*  MG  --   --  1.5* 2.5* 2.3 1.8  --   --   --   PHOS 2.9  --  3.0 3.2 3.5  --   --   --   --   < > = values in this interval not displayed. GFR: Estimated Creatinine Clearance: 43.9 mL/min (by C-G formula based on SCr of 0.9 mg/dL). Liver Function Tests:  Recent Labs Lab 04/27/17 0313 04/28/17 0357  AST 510*  --   ALT 261*  --   ALKPHOS 256*  --   BILITOT 0.7  --   PROT 5.2*  --   ALBUMIN 1.8* 1.9*   No results for input(s): LIPASE, AMYLASE in the last 168 hours. No results for input(s): AMMONIA in the last 168 hours. Coagulation Profile: No results for input(s): INR, PROTIME in the last 168 hours. Cardiac Enzymes:  Recent Labs Lab 04/26/17 0507 04/26/17 0842  TROPONINI 2.87* 2.28*   BNP (last 3 results) No results for input(s): PROBNP in the last 8760 hours. HbA1C: No results for input(s): HGBA1C in the last 72 hours. CBG:  Recent Labs Lab 05/01/17 1210 05/01/17 2009 05/01/17 2351 05/02/17 0347 05/02/17 0828  GLUCAP 82 73 81 77 83   Lipid Profile: No results for input(s): CHOL, HDL, LDLCALC, TRIG, CHOLHDL, LDLDIRECT in the last 72 hours. Thyroid Function Tests: No results for input(s): TSH, T4TOTAL, FREET4, T3FREE, THYROIDAB in the last 72 hours. Anemia Panel: No results for input(s):  VITAMINB12, FOLATE, FERRITIN, TIBC, IRON, RETICCTPCT in the last 72 hours. Urine analysis:    Component Value Date/Time   COLORURINE YELLOW 04/26/2017 0830   APPEARANCEUR HAZY (A) 04/26/2017 0830   LABSPEC 1.015 04/26/2017 0830   PHURINE 5.0 04/26/2017 0830   GLUCOSEU NEGATIVE 04/26/2017 0830   HGBUR NEGATIVE 04/26/2017 0830   BILIRUBINUR NEGATIVE 04/26/2017 0830   KETONESUR NEGATIVE 04/26/2017 0830   PROTEINUR NEGATIVE 04/26/2017 0830   NITRITE NEGATIVE 04/26/2017 0830   LEUKOCYTESUR NEGATIVE 04/26/2017 0830   Sepsis Labs: @LABRCNTIP (procalcitonin:4,lacticidven:4)  ) Recent Results (from the past 240 hour(s))  Culture, blood (routine x 2)     Status: None   Collection Time: 04/23/17 11:50 AM  Result Value Ref Range Status   Specimen Description BLOOD RIGHT HAND  Final   Special Requests IN PEDIATRIC BOTTLE Blood Culture adequate volume  Final   Culture NO GROWTH 5 DAYS  Final   Report Status 04/28/2017 FINAL  Final  Culture, blood (routine x 2)     Status: None   Collection Time: 04/23/17 12:11 PM  Result Value Ref Range Status   Specimen Description BLOOD RIGHT ARM  Final   Special Requests IN PEDIATRIC BOTTLE Blood Culture adequate volume  Final   Culture NO GROWTH 5 DAYS  Final   Report Status 04/28/2017 FINAL  Final  Culture, respiratory (NON-Expectorated)     Status: None   Collection Time: 04/23/17  3:28 PM  Result Value Ref Range Status   Specimen Description TRACHEAL ASPIRATE  Final   Special Requests NONE  Final   Gram Stain   Final    MODERATE WBC PRESENT, PREDOMINANTLY MONONUCLEAR RARE YEAST    Culture FEW CANDIDA ALBICANS  Final   Report Status 04/25/2017 FINAL  Final  Culture, blood (routine x 2)     Status: None   Collection Time: 04/26/17  7:11 AM  Result Value  Ref Range Status   Specimen Description BLOOD RIGHT HAND  Final   Special Requests IN PEDIATRIC BOTTLE Blood Culture adequate volume  Final   Culture NO GROWTH 5 DAYS  Final   Report Status  05/01/2017 FINAL  Final  Culture, respiratory (NON-Expectorated)     Status: None   Collection Time: 04/26/17  7:24 AM  Result Value Ref Range Status   Specimen Description TRACHEAL ASPIRATE  Final   Special Requests NONE  Final   Gram Stain   Final    FEW WBC PRESENT,BOTH PMN AND MONONUCLEAR NO SQUAMOUS EPITHELIAL CELLS SEEN NO ORGANISMS SEEN    Culture RARE CANDIDA ALBICANS  Final   Report Status 04/28/2017 FINAL  Final  Culture, blood (routine x 2)     Status: None   Collection Time: 04/26/17  8:05 AM  Result Value Ref Range Status   Specimen Description BLOOD RIGHT ANTECUBITAL  Final   Special Requests IN PEDIATRIC BOTTLE Blood Culture adequate volume  Final   Culture NO GROWTH 5 DAYS  Final   Report Status 05/01/2017 FINAL  Final  C difficile quick scan w PCR reflex     Status: Abnormal   Collection Time: 04/27/17 11:06 AM  Result Value Ref Range Status   C Diff antigen POSITIVE (A) NEGATIVE Final   C Diff toxin NEGATIVE NEGATIVE Final   C Diff interpretation Results are indeterminate. See PCR results.  Final    Comment: PERFORMED ON FORMED STOOL PER DR. [facility] REQUEST         Radiology Studies: Ct Head Wo Contrast  Result Date: 04/30/2017 CLINICAL DATA:  Acute altered mental status EXAM: CT HEAD WITHOUT CONTRAST TECHNIQUE: Contiguous axial images were obtained from the base of the skull through the vertex without intravenous contrast. COMPARISON:  None available FINDINGS: Brain: Age related brain atrophy and chronic white matter microvascular ischemic changes throughout both cerebral hemispheres. Mild associated ventricular enlargement. No acute intracranial hemorrhage, definite new infarction, mass lesion, midline shift, herniation, hydrocephalus, or extra-axial fluid collection. No focal mass effect or edema. Cisterns are patent. Cerebellar atrophy as well. Vascular: Intracranial atherosclerosis at the skullbase. No hyperdense vessel. Skull: Normal. Negative for fracture  or focal lesion. Sinuses/Orbits: No acute finding. Other: None. IMPRESSION: Brain atrophy and chronic white matter microvascular ischemic changes. No acute intracranial abnormality by noncontrast CT. Electronically Signed   By: Jerilynn Mages.  Shick M.D.   On: 04/30/2017 13:04        Scheduled Meds: . arformoterol  15 mcg Nebulization BID  . aspirin  325 mg Per Tube Daily  . budesonide (PULMICORT) nebulizer solution  0.5 mg Nebulization BID  . chlorhexidine  15 mL Mouth Rinse BID  . Chlorhexidine Gluconate Cloth  6 each Topical Daily  . guaiFENesin  600 mg Oral BID  . heparin subcutaneous  5,000 Units Subcutaneous Q8H  . insulin aspart  1-3 Units Subcutaneous Q4H  . mouth rinse  15 mL Mouth Rinse q12n4p  . pantoprazole (PROTONIX) IV  40 mg Intravenous Q24H  . potassium chloride  40 mEq Oral Once  . sennosides  5 mL Oral BID  . sodium chloride flush  10-40 mL Intracatheter Q12H   Continuous Infusions: . sodium chloride 10 mL/hr at 04/30/17 0144     LOS: 12 days    Time spent: 40 minutes    Kalisha Keadle, Geraldo Docker, MD [facility] Pager [phone]   If 7PM-7AM, please contact night-coverage [email or web address] Password [email or web address] 05/02/2017, 9:01 AM

## 2017-05-02 NOTE — Care Management Important Message (Signed)
Important Message  Patient Details  Name: Alexandra House MRN: [id number] Date of Birth: [birth year]   Medicare Important Message Given:  Yes    Orbie Pyo 05/02/2017, 12:32 PM

## 2017-05-02 NOTE — Progress Notes (Signed)
Pt has a order to 4 ride potassium run, 1st one is running this time

## 2017-05-02 NOTE — Progress Notes (Signed)
Foley catheter was inserted after MD order, it is charted in Epic, pt tolerated well, will continue to monitor

## 2017-05-02 NOTE — Progress Notes (Signed)
Nutrition Follow-up  DOCUMENTATION CODES:   Non-severe (moderate) malnutrition in context of chronic illness  INTERVENTION:   -Ensure Enlive po TID between meals, each supplement provides 350 kcal and 20 grams of protein  -Magic cup TID with meals, each supplement provides 290 kcal and 9 grams of protein  -No BM in 5 days, noted bowel regimen started on 7/17. If pt continues without BM, recommend considering suppository/enema.    NUTRITION DIAGNOSIS:   Malnutrition (moderate) related to chronic illness (smoker, neuropathy, osteoporosis, CAD) as evidenced by mild depletion of body fat, mild depletion of muscle mass, percent weight loss (12% weight loss within one month).  Continues but being addressed via supplements  GOAL:   Patient will meet greater than or equal to 90% of their needs  Not met  MONITOR:   Diet advancement, PO intake, Labs, I & O's  REASON FOR ASSESSMENT:   Consult Enteral/tube feeding initiation and management  ASSESSMENT:   71 yo female with PMH of fibromyalgia, HLD, neuropathy, osteoporosis, anxiety, carotid stenosis, who was admitted on 7/7 with progressive metabolic acidosis, acute respiratory failure, requiring intubation.  7/16 Extubated 7/17 Developed confusion, diet advanced to Dysphagia III, Thin Liquids (SLP following)  Pt sleeping on visit today. Husband at bedside. Per report, pt has been very confused and agitated, pulling off oxygen, BP cuff and pulling at lines  Pt with very minimal po intake. Pt ate a bite of eggs and spoonful of oatmeal this AM. Pt only ate bites of brocolli and drank some Ensure yesterday per Husband.   Labs: potassium 3.1 Meds: KCl, senokot, colace prn  Diet Order:  DIET DYS 3 Room service appropriate? Yes; Fluid consistency: Thin  Skin:  Reviewed, no issues  Last BM:  7/14  Height:   Ht Readings from Last 1 Encounters:  05/02/17 _0  (1.575 m)    Weight:   Wt Readings from Last 1 Encounters:   05/02/17 110 lb 4.8 oz (50 kg)    Ideal Body Weight:  43.9 kg  BMI:  Body mass index is 20.17 kg/m.  Estimated Nutritional Needs:   Kcal:  1400-1600 kcals  Protein:  65-75 gm  Fluid:  1.3-1.5 L  EDUCATION NEEDS:   No education needs identified at this time  [place], [place], LDN [phone] Pager  [phone] Weekend/On-Call Pager

## 2017-05-02 NOTE — Evaluation (Signed)
Occupational Therapy Evaluation Patient Details Name: Alexandra House MRN: [id number] DOB: 08/13/1946 Today's Date: 05/02/2017    History of Present Illness Pt adm with septic shock likely due to aspiration PNA. Pt intubated 7/8 and extubated 7/12. Reintubated 7/13 and extubated 7/16. Pt with bradycardia requiring external pacing on admission. PMH - chf, copd, osteoporosis, depression, syncope   Clinical Impression   Pt admitted with above. She demonstrates the below listed deficits and will benefit from continued OT to maximize safety and independence with BADLs.  Pt lethargic during OT eval and unable to participate fully.  She sat EOB x ~20 mins with min A and stood x 2 with max A, but unable to engage in ADL tasks.  Per pt's sister, pt was fully independent and was active in the community PTA.   Recommend CIR.  Will follow.       Follow Up Recommendations  CIR    Equipment Recommendations  3 in 1 bedside commode    Recommendations for Other Services Rehab consult     Precautions / Restrictions Precautions Precautions: Fall      Mobility Bed Mobility Overal bed mobility: Needs Assistance Bed Mobility: Supine to Sit;Sit to Supine     Supine to sit: Total assist Sit to supine: Total assist   General bed mobility comments: Pt lethargic and did not attempt to assist   Transfers Overall transfer level: Needs assistance Equipment used: 1 person hand held assist Transfers: Sit to/from Stand Sit to Stand: Max assist         General transfer comment: Pt stood x 2 requiring max A to lift hips from bed and to maintain standing     Balance Overall balance assessment: Needs assistance Sitting-balance support: Single extremity supported Sitting balance-Leahy Scale: Poor Sitting balance - Comments: sat EOB with min A for balance.    Standing balance support: Single extremity supported Standing balance-Leahy Scale: Poor Standing balance comment: requires max a                             ADL either performed or assessed with clinical judgement   ADL Overall ADL's : Needs assistance/impaired Eating/Feeding: Total assistance   Grooming: Total assistance;Wash/dry hands;Wash/dry face;Brushing hair;Sitting   Upper Body Bathing: Total assistance;Sitting   Lower Body Bathing: Total assistance;Sit to/from stand   Upper Body Dressing : Total assistance;Sitting   Lower Body Dressing: Total assistance;Sit to/from stand   Toilet Transfer: Total assistance;Stand-pivot;BSC   Toileting- Clothing Manipulation and Hygiene: Total assistance;Sit to/from stand Toileting - Clothing Manipulation Details (indicate cue type and reason): Pt incontinent of stool.  Assisted with peri care      Functional mobility during ADLs: Maximal assistance General ADL Comments: Pt lethargic.  minimally participative      Vision   Additional Comments: keeps eyes closed      Perception     Praxis      Pertinent Vitals/Pain Pain Assessment: Faces Faces Pain Scale: No hurt     Hand Dominance     Extremity/Trunk Assessment Upper Extremity Assessment Upper Extremity Assessment: Generalized weakness   Lower Extremity Assessment Lower Extremity Assessment: Defer to PT evaluation   Cervical / Trunk Assessment Cervical / Trunk Assessment: Kyphotic   Communication Communication Communication: Other (comment) (Pt with limited activity )   Cognition Arousal/Alertness: Lethargic Behavior During Therapy: Flat affect Overall Cognitive Status: Impaired/Different from baseline Area of Impairment: Orientation  Orientation Level: Disoriented to;Time;Situation Current Attention Level: Focused   Following Commands: Follows one step commands inconsistently       General Comments: Pt keeps eyes closed majority of the time.  She will occasionally answer questions with a whisper.  She is oriented to [facility] only    General Comments  sister present  during eval.  Pt with Alevyn dressing on Lt posterior upper arm that was saturated with blood and draining - RN notified     Exercises Exercises: General Upper Extremity General Exercises - Upper Extremity Shoulder Flexion: AAROM;Right;Left;10 reps;Seated   Shoulder Instructions      Home Living Family/patient expects to be discharged to:: Private residence Living Arrangements: Spouse/significant other Available Help at Discharge: Family;Available 24 hours/day Type of Home: House Home Access: Stairs to enter [employer] of Steps: 7-8 Entrance Stairs-Rails: Right Home Layout: One level               Home Equipment: None          Prior Functioning/Environment Level of Independence: Independent        Comments: Per sister, pt drove and was active in community         OT Problem List: Decreased strength;Decreased activity tolerance;Impaired balance (sitting and/or standing);Decreased cognition;Decreased safety awareness;Decreased knowledge of use of DME or AE;Cardiopulmonary status limiting activity      OT Treatment/Interventions: Self-care/ADL training;Therapeutic exercise;Energy conservation;DME and/or AE instruction;Therapeutic activities;Cognitive remediation/compensation;Patient/family education;Balance training    OT Goals(Current goals can be found in the care plan section) Acute Rehab OT Goals Patient Stated Goal: Not stated OT Goal Formulation: Patient unable to participate in goal setting Time For Goal Achievement: 05/16/17 Potential to Achieve Goals: Good ADL Goals Pt Will Perform Grooming: with min assist;sitting Pt Will Perform Upper Body Bathing: with min assist;sitting Pt Will Perform Lower Body Bathing: with mod assist;sit to/from stand Pt Will Transfer to Toilet: with min assist;stand pivot transfer;bedside commode Additional ADL Goal #1: Pt will sustain attention x 4 mins so simple ADL tasks   OT Frequency: Min 2X/week   Barriers to  D/C: Decreased caregiver support          Co-evaluation              AM-PAC PT "6 Clicks" Daily Activity     Outcome Measure Help from another person eating meals?: Total Help from another person taking care of personal grooming?: Total Help from another person toileting, which includes using toliet, bedpan, or urinal?: Total Help from another person bathing (including washing, rinsing, drying)?: Total Help from another person to put on and taking off regular upper body clothing?: Total Help from another person to put on and taking off regular lower body clothing?: Total 6 Click Score: 6   End of Session Equipment Utilized During Treatment: Oxygen Nurse Communication: Mobility status;Other (comment) (bleeding from Lt upper arm )  Activity Tolerance: Patient limited by lethargy Patient left: in bed;with call bell/phone within reach;with family/visitor present  OT Visit Diagnosis: Unsteadiness on feet (R26.81);Muscle weakness (generalized) (M62.81)                Time: 2633-3545 OT Time Calculation (min): 29 min Charges:  OT General Charges $OT Visit: 1 Procedure OT Evaluation $OT Eval Moderate Complexity: 1 Procedure OT Treatments $Therapeutic Activity: 8-22 mins G-Codes:     [facility], OTR/L [phone]   Lucille Passy M 05/02/2017, 4:33 PM

## 2017-05-03 LAB — BLOOD GAS, ARTERIAL
ACID-BASE EXCESS: 4 mmol/L — AB (ref 0.0–2.0)
Bicarbonate: 27.3 mmol/L (ref 20.0–28.0)
DRAWN BY: 275531
O2 Content: 2 L/min
O2 Saturation: 98.7 %
PH ART: 7.489 — AB (ref 7.350–7.450)
Patient temperature: 98.6
pCO2 arterial: 36.3 mmHg (ref 32.0–48.0)
pO2, Arterial: 113 mmHg — ABNORMAL HIGH (ref 83.0–108.0)

## 2017-05-03 LAB — GLUCOSE, CAPILLARY
GLUCOSE-CAPILLARY: 130 mg/dL — AB (ref 65–99)
GLUCOSE-CAPILLARY: 143 mg/dL — AB (ref 65–99)
GLUCOSE-CAPILLARY: 93 mg/dL (ref 65–99)
GLUCOSE-CAPILLARY: 95 mg/dL (ref 65–99)
Glucose-Capillary: 93 mg/dL (ref 65–99)
Glucose-Capillary: 107 mg/dL — ABNORMAL HIGH (ref 65–99)

## 2017-05-03 LAB — CBC
HCT: 30.5 % — ABNORMAL LOW (ref 36.0–46.0)
Hemoglobin: 9.3 g/dL — ABNORMAL LOW (ref 12.0–15.0)
MCH: 27.8 pg (ref 26.0–34.0)
MCHC: 30.5 g/dL (ref 30.0–36.0)
MCV: 91 fL (ref 78.0–100.0)
PLATELETS: 278 10*3/uL (ref 150–400)
RBC: 3.35 MIL/uL — AB (ref 3.87–5.11)
RDW: 22.3 % — ABNORMAL HIGH (ref 11.5–15.5)
WBC: 9.4 10*3/uL (ref 4.0–10.5)

## 2017-05-03 LAB — BASIC METABOLIC PANEL
Anion gap: 7 (ref 5–15)
BUN: 14 mg/dL (ref 6–20)
CHLORIDE: 101 mmol/L (ref 101–111)
CO2: 28 mmol/L (ref 22–32)
Calcium: 6.4 mg/dL — CL (ref 8.9–10.3)
Creatinine, Ser: 0.83 mg/dL (ref 0.44–1.00)
GFR calc Af Amer: >60 mL/min (ref >60)
GFR calc non Af Amer: >60 mL/min (ref >60)
GLUCOSE: 101 mg/dL — AB (ref 65–99)
POTASSIUM: 4.1 mmol/L (ref 3.5–5.1)
Sodium: 136 mmol/L (ref 135–145)

## 2017-05-03 LAB — MAGNESIUM: Magnesium: 1.6 mg/dL — ABNORMAL LOW (ref 1.7–2.4)

## 2017-05-03 MED ORDER — MAGNESIUM SULFATE 2 GM/50ML IV SOLN
2.0000 g | Freq: Once | INTRAVENOUS | Status: AC
  Administered 2017-05-03: 2 g via INTRAVENOUS
  Filled 2017-05-03: qty 50

## 2017-05-03 MED ORDER — CALCIUM CARBONATE ANTACID 500 MG PO CHEW: 1.0000 | CHEWABLE_TABLET | Freq: Three times a day (TID) | ORAL | Status: DC

## 2017-05-03 MED ORDER — FUROSEMIDE 10 MG/ML IJ SOLN
20.0000 mg | Freq: Once | INTRAMUSCULAR | Status: AC
  Administered 2017-05-03: 20 mg via INTRAVENOUS
  Filled 2017-05-03: qty 2

## 2017-05-03 MED ORDER — CLOTRIMAZOLE 10 MG MT TROC
10.0000 mg | Freq: Every day | OROMUCOSAL | Status: DC
  Administered 2017-05-04 – 2017-05-09 (×17): 10 mg via ORAL
  Filled 2017-05-03 (×31): qty 1

## 2017-05-03 NOTE — Progress Notes (Signed)
Paged MD per respiratory for order for ABG.

## 2017-05-03 NOTE — Progress Notes (Signed)
Mg 1.6, Ca 6.4, MD notified, will continue  to monitor, Thanks

## 2017-05-03 NOTE — Progress Notes (Signed)
  Speech Language Pathology Treatment: Dysphagia  Patient Details Name: Alexandra House MRN: [id number] DOB: [birth year] Today's Date: 05/03/2017 Time: 8115-7262 SLP Time Calculation (min) (ACUTE ONLY): 20 min  Assessment / Plan / Recommendation Clinical Impression  Dysphagia treatment provided for PO trials as pt had recently been made NPO on 7/19. During treatment session the pt had eyes open but was nonverbal, was following some simple 1-step commands with verbal/ visual cues, occasionally nodding in response to a family friend who was at bedside. Pt currently with weak, congested cough. Tolerated/ appeared to enjoy ice chips without immediate s/s of aspiration but continued coughing intermittently throughout the session. Unable to sip through straw despite max cues. Attempted trial of applesauce; pt pushed material posteriorly but was not observed to initiate a pharyngeal swallow and required oral suctioning. Son and friend at bedside reported they had been providing the pt water through a syringe; I advised against this as the pt would not have any oral preparation for these boluses and it would likely increase the risk of aspirating. Agree that pt should remain NPO at this time given current cognitive and respiratory status, but allow ice chips following oral care. Will continue to follow closely for PO readiness or readiness for an objective evaluation.   **Concerns for oral thrush as well.   HPI HPI: Alexandra House an 71 y.o.femalepast medical history of COPD, osteoporosis, fibromyalgia, anxiety relates 4 weeks of intractable nausea vomiting without pain she had an EGD and abdominal ultrasound which were unremarkable. Per chart pt has lost 10 pounds over the last 4 weeks for nausea and vomiting. Found to have acute renal failure. Intubated 7/8-7/12 and reintubated 7/12-7/16. CXR diffuse bilateral pulmonary infiltrates and small bilateral pleural effusions suggesting mild CHF.        SLP  Plan  Goals updated       Recommendations  Medication Administration: Via alternative means Postural Changes and/or Swallow Maneuvers: Seated upright 90 degrees                Oral Care Recommendations: Oral care prior to ice chip/H20 Follow up Recommendations: 24 hour supervision/assistance SLP Visit Diagnosis: Dysphagia, unspecified (R13.10) Plan: Goals updated       [place], [place], CCC-SLP 05/03/2017, 10:18 AM [id number]

## 2017-05-03 NOTE — Progress Notes (Signed)
   I have been reviewing the chart daily to see if I could offer any cardiology suggestions.  However, she has had a multitude of non cardiac issues and no further issues with bradycardia or sustained hypotension.  We will be available for any further acute cardiac questions.  Please call us back if you have specific questions or we can be of help.  Otherwise, I don't think that we are adding much.  I will sign off.

## 2017-05-03 NOTE — Progress Notes (Signed)
PT Cancellation Note  Patient Details Name: Alexandra House MRN: [id number] DOB: December 11, 1945   Cancelled Treatment:    Reason Eval/Treat Not Completed: Medical issues which prohibited therapy (PT held per RN request, pt to have ABG drawn soon. Pt very lethargic.  Will follow. )   Philomena Doheny 05/03/2017, 12:40 PM  [phone]

## 2017-05-03 NOTE — Progress Notes (Signed)
Removed foam dressing from pt left arm that family was concerned about. Family wants to know where it came from. This RN told family that after taking dressing off, there is nothing but dried blood and bruising. Informed family it was most likely from needle sticks and the fact that patient is on SQ heparin. Family has no other stated concerns at this time.

## 2017-05-03 NOTE — Progress Notes (Signed)
[facility] TEAM 1 - Stepdown/ICU TEAM  LYRIQUE HAKIM  SHF:[id number] DOB: 09-Aug-1946 DOA: 04/20/2017 PCP: Marton Redwood, MD    Brief Narrative:  [name] w/ a Hx of Depression, Anxiety, Fibromyalgia, Tobacco abuse with COPD, Osteoporosis, Dyslipidemia, Chronic Systolic and Diastolic CHF, Bilateral Carotid stenosis, and Syncope who presented w/ 3-4 weeks of intractable nausea and vomiting without abdominal pain with an inability to keep anything down. She had been to the ER on several occasions with no definitive etiology found and each time briefly improved after the administration of IV fluids. Outpatient US of the abdomen and EGD were unrevealing.   On the day of her admission she was noted to be hypothermic, tachypneic, and bradycardic with pulses as low as 32 bpm.  Initial blood pressure was 79/39 with an MAP of 48.  She had a creatinine of 3.09. Lactic acid was 6.39. White count was 27,700 with left shift, platelets were 709,000. Troponin elevated at 2.05.   Patient is a DO NOT RESUSCITATE although agreeable with short-term pressors and utilization of BiPAP if necessary..  Subjective: The patient is resting quietly in bed when I enter the room.  She opens her eyes to my voice and will answer all of my questions appropriately though with a very soft voice.  She follows simple commands.  She is alert and oriented times all parameters except for date.  Her sisters at bedside and states that she has improved today but is not yet back to her baseline.  Assessment & Plan:  Acute encephalopathy / acute delirium  No CVA on CT head - B-12 and folate not deficient - PCO2 normal - appears to be slowly improving - suspect presently she is suffering w/ ICU delirium   Intractable nausea and vomiting with weight loss Denies present nausea or vomiting, but has poor intake - encouraged increased intake - follow - ?if pt has had a gastric emptying scan   Acute hypoxic respiratory failure - severe emphysema -  aspiration pneumonitis B LL Has completed a course of empiric antibiotic - appears stable from respiratory standpoint at present   Chronic systolic and diastolic congestive heart failure EF 45% - beginning to develop signif peripheral edema - give limited dose of diuretic and follow  Filed Weights   05/01/17 0500 05/02/17 0502 05/03/17 0510  Weight: 47.8 kg (105 lb 6.1 oz) 50 kg (110 lb 4.8 oz) 49.8 kg (109 lb 11.2 oz)    Sinus bradycardia Appears to have resolved - Cards has signed off - follow on tele   Demand ischemia Trop peak at 4.39 - for outpt f/u   Anemia Hemoglobin stable at present no evidence of acute blood loss  Acute renal failure Resolved with volume resuscitation  Hypokalemia Corrected with replacement  Hypomagnesemia Continue to supplement to goal of 2.0  Hypocalcemia Ca2+ corrects to 8.0, therefore mildly low - supplement and follow  DVT prophylaxis: SQ heparin  Code Status: FULL CODE Family Communication: Spoke with sister at bedside Disposition Plan: tele - PT/OT - suspect SNF will be required   Consultants:  PCCM Cardiology  Procedures: 7/07 admit with N/V, weakness, hypotension  7/08 intubated 7/12 extubated 7/13 re-intubated 7/16 extubated  7/17 new onset acute confusion  Antimicrobials:  None presently  Objective: Blood pressure (!) 126/52, pulse 72, temperature (!) 97.5 F (36.4 C), temperature source Oral, resp. rate 18, height 5\' 2"  (1.575 m), weight 49.8 kg (109 lb 11.2 oz), SpO2 100 %.  Intake/Output Summary (Last 24 hours) at  05/03/17 1421 Last data filed at 05/03/17 1044  Gross per 24 hour  Intake           484.17 ml  Output              900 ml  Net          -415.83 ml   Filed Weights   05/01/17 0500 05/02/17 0502 05/03/17 0510  Weight: 47.8 kg (105 lb 6.1 oz) 50 kg (110 lb 4.8 oz) 49.8 kg (109 lb 11.2 oz)    Examination: General: No acute respiratory distress Lungs: Clear to auscultation bilaterally without wheezes or  crackles Cardiovascular: Regular rate and rhythm without murmur gallop or rub normal S1 and S2 Abdomen: Nontender, nondistended, soft, bowel sounds positive, no rebound, no ascites, no appreciable mass Extremities: 1+ edema bilateral upper and lower extremities  CBC:  Recent Labs Lab 04/29/17 0410 04/30/17 0358 05/01/17 0332 05/02/17 0550 05/03/17 0327  WBC 14.1* 9.5 8.7 10.4 9.4  HGB 9.7* 9.4* 9.2* 9.4* 9.3*  HCT 30.5* 29.5* 28.2* 30.0* 30.5*  MCV 88.2 88.6 89.0 90.4 91.0  PLT 499* 371 336 295 622   Basic Metabolic Panel:  Recent Labs Lab 04/28/17 0357 04/28/17 2110 04/29/17 0410 04/30/17 0358 05/01/17 0902 05/01/17 1606 05/02/17 0550 05/02/17 2044 05/03/17 0327  NA 135 132* 133* 135 136 135 137 139 136  K 3.0* 3.5 3.5 2.9* 2.8* 3.6 3.1* 4.0 4.1  CL 95* 95* 93* 91* 93* 94* 99* 100* 101  CO2 28 29 28 28  33* 34* 30 29 28   GLUCOSE 93 95 100* 111* 89 97 76 86 101*  BUN 30* 32* 28* 27* 23* 23* 19 14 14   CREATININE 1.62* 1.58* 1.50* 1.37* 1.14* 1.09* 0.90 0.88 0.83  CALCIUM 6.5* 6.5* 6.3* 5.8* 6.0* 5.9* 6.6* 6.8* 6.4*  MG 1.5* 2.5* 2.3 1.8  --   --   --   --  1.6*  PHOS 3.0 3.2 3.5  --   --   --   --   --   --    GFR: Estimated Creatinine Clearance: 47.5 mL/min (by C-G formula based on SCr of 0.83 mg/dL).  Liver Function Tests:  Recent Labs Lab 04/27/17 0313 04/28/17 0357 05/02/17 1849 05/02/17 2044  AST 510*  --   --  46*  ALT 261*  --   --  61*  ALKPHOS 256*  --   --  143*  BILITOT 0.7  --   --  1.0  PROT 5.2*  --   --  4.9*  ALBUMIN 1.8* 1.9* 2.1* 2.0*    CBG:  Recent Labs Lab 05/02/17 2131 05/03/17 0011 05/03/17 0421 05/03/17 0759 05/03/17 1224  GLUCAP 85 93 93 107* 95    Recent Results (from the past 240 hour(s))  Culture, respiratory (NON-Expectorated)     Status: None   Collection Time: 04/23/17  3:28 PM  Result Value Ref Range Status   Specimen Description TRACHEAL ASPIRATE  Final   Special Requests NONE  Final   Gram Stain   Final      MODERATE WBC PRESENT, PREDOMINANTLY MONONUCLEAR RARE YEAST    Culture FEW CANDIDA ALBICANS  Final   Report Status 04/25/2017 FINAL  Final  Culture, blood (routine x 2)     Status: None   Collection Time: 04/26/17  7:11 AM  Result Value Ref Range Status   Specimen Description BLOOD RIGHT HAND  Final   Special Requests IN PEDIATRIC BOTTLE Blood Culture adequate volume  Final  Culture NO GROWTH 5 DAYS  Final   Report Status 05/01/2017 FINAL  Final  Culture, respiratory (NON-Expectorated)     Status: None   Collection Time: 04/26/17  7:24 AM  Result Value Ref Range Status   Specimen Description TRACHEAL ASPIRATE  Final   Special Requests NONE  Final   Gram Stain   Final    FEW WBC PRESENT,BOTH PMN AND MONONUCLEAR NO SQUAMOUS EPITHELIAL CELLS SEEN NO ORGANISMS SEEN    Culture RARE CANDIDA ALBICANS  Final   Report Status 04/28/2017 FINAL  Final  Culture, blood (routine x 2)     Status: None   Collection Time: 04/26/17  8:05 AM  Result Value Ref Range Status   Specimen Description BLOOD RIGHT ANTECUBITAL  Final   Special Requests IN PEDIATRIC BOTTLE Blood Culture adequate volume  Final   Culture NO GROWTH 5 DAYS  Final   Report Status 05/01/2017 FINAL  Final  C difficile quick scan w PCR reflex     Status: Abnormal   Collection Time: 04/27/17 11:06 AM  Result Value Ref Range Status   C Diff antigen POSITIVE (A) NEGATIVE Final   C Diff toxin NEGATIVE NEGATIVE Final   C Diff interpretation Results are indeterminate. See PCR results.  Final    Comment: PERFORMED ON FORMED STOOL PER DR. MANNAM REQUEST     Scheduled Meds: . arformoterol  15 mcg Nebulization BID  . aspirin  325 mg Per Tube Daily  . budesonide (PULMICORT) nebulizer solution  0.5 mg Nebulization BID  . chlorhexidine  15 mL Mouth Rinse BID  . Chlorhexidine Gluconate Cloth  6 each Topical Daily  . feeding supplement (ENSURE ENLIVE)  237 mL Oral TID BM  . guaiFENesin  600 mg Oral BID  . heparin subcutaneous  5,000  Units Subcutaneous Q8H  . insulin aspart  1-3 Units Subcutaneous TID AC & HS  . ipratropium-albuterol  3 mL Nebulization BID  . mouth rinse  15 mL Mouth Rinse q12n4p  . pantoprazole sodium  40 mg Per Tube Daily  . potassium chloride  40 mEq Oral Once  . sennosides  5 mL Oral BID  . sodium chloride flush  10-40 mL Intracatheter Q12H     LOS: 13 days   Cherene Altes, MD [facility] Office  [phone] Pager - Text Page per Amion as per below:  On-Call/Text Page:      [email or web address]      password [email or web address]  If 7PM-7AM, please contact night-coverage [email or web address] Password [email or web address] 05/03/2017, 2:22 PM

## 2017-05-03 NOTE — Progress Notes (Signed)
Upon entering room, pt lips red. RN asked if pt has had anything except ice, sister at bedside states she has been giving pt "sunset slushie". Advised sister and other members at bedside that pt is NPO d/t alertness. They have been advised to not give patient anything except ice only when patient fully awake and alert. Family insistent on giving pt something. Advised family that if pt not fully alert she could aspirate. They still do not seem to understand.  Brother who was at bedside earlier today was told by both speech therapy and RN that patient was NPO except ice chips when fully awake.   Paged MD to make aware. No new orders at this time.

## 2017-05-03 NOTE — Progress Notes (Addendum)
Daughter in law at bedside with more questions about NPO order. Sister at bedside says she has given applesauce to which pt tolerated. Paged MD again to advise. NO new orders at this time.

## 2017-05-03 NOTE — Progress Notes (Signed)
Read new clear liquid order placed by MD to both sister and sister in law at bedside to make sure that they understand clear liquids ONLY order. Both verbalize understanding. Will pass along in report to night RN.

## 2017-05-04 ENCOUNTER — Inpatient Hospital Stay (HOSPITAL_COMMUNITY): Payer: Medicare HMO

## 2017-05-04 LAB — COMPREHENSIVE METABOLIC PANEL WITH GFR
ALT: 45 U/L (ref 14–54)
AST: 41 U/L (ref 15–41)
Albumin: 1.9 g/dL — ABNORMAL LOW (ref 3.5–5.0)
Alkaline Phosphatase: 110 U/L (ref 38–126)
Anion gap: 7 (ref 5–15)
BUN: 10 mg/dL (ref 6–20)
CO2: 26 mmol/L (ref 22–32)
Calcium: 6.3 mg/dL — CL (ref 8.9–10.3)
Chloride: 105 mmol/L (ref 101–111)
Creatinine, Ser: 0.62 mg/dL (ref 0.44–1.00)
GFR calc Af Amer: >60 mL/min
GFR calc non Af Amer: >60 mL/min
Glucose, Bld: 87 mg/dL (ref 65–99)
Potassium: 3.6 mmol/L (ref 3.5–5.1)
Sodium: 138 mmol/L (ref 135–145)
Total Bilirubin: 0.9 mg/dL (ref 0.3–1.2)
Total Protein: 4.4 g/dL — ABNORMAL LOW (ref 6.5–8.1)

## 2017-05-04 LAB — GLUCOSE, CAPILLARY
GLUCOSE-CAPILLARY: 121 mg/dL — AB (ref 65–99)
Glucose-Capillary: 93 mg/dL (ref 65–99)
Glucose-Capillary: 84 mg/dL (ref 65–99)

## 2017-05-04 LAB — MAGNESIUM: Magnesium: 1.7 mg/dL (ref 1.7–2.4)

## 2017-05-04 LAB — CBC
HCT: 30.6 % — ABNORMAL LOW (ref 36.0–46.0)
Hemoglobin: 9.4 g/dL — ABNORMAL LOW (ref 12.0–15.0)
MCH: 27.6 pg (ref 26.0–34.0)
MCHC: 30.7 g/dL (ref 30.0–36.0)
MCV: 89.7 fL (ref 78.0–100.0)
PLATELETS: 241 10*3/uL (ref 150–400)
RBC: 3.41 MIL/uL — ABNORMAL LOW (ref 3.87–5.11)
RDW: 21.9 % — AB (ref 11.5–15.5)
WBC: 11.1 10*3/uL — ABNORMAL HIGH (ref 4.0–10.5)

## 2017-05-04 LAB — PHOSPHORUS: Phosphorus: 1.8 mg/dL — ABNORMAL LOW (ref 2.5–4.6)

## 2017-05-04 LAB — HAPTOGLOBIN: HAPTOGLOBIN: 199 mg/dL (ref 34–200)

## 2017-05-04 MED ORDER — ACETAMINOPHEN 160 MG/5ML PO SOLN
650.0000 mg | Freq: Four times a day (QID) | ORAL | Status: DC | PRN
  Filled 2017-05-04: qty 20.3

## 2017-05-04 MED ORDER — FUROSEMIDE 10 MG/ML IJ SOLN
20.0000 mg | Freq: Once | INTRAMUSCULAR | Status: AC
  Administered 2017-05-04: 20 mg via INTRAVENOUS
  Filled 2017-05-04: qty 2

## 2017-05-04 MED ORDER — SENNOSIDES 8.8 MG/5ML PO SYRP
5.0000 mL | ORAL_SOLUTION | Freq: Two times a day (BID) | ORAL | Status: DC
  Filled 2017-05-04 (×5): qty 5

## 2017-05-04 MED ORDER — JEVITY 1.2 CAL PO LIQD
1000.0000 mL | ORAL | Status: DC
  Administered 2017-05-04: 1000 mL
  Filled 2017-05-04 (×3): qty 1000

## 2017-05-04 MED ORDER — ONDANSETRON HCL 4 MG PO TABS: 4.0000 mg | ORAL_TABLET | Freq: Four times a day (QID) | ORAL | Status: DC | PRN

## 2017-05-04 MED ORDER — CALCIUM CARBONATE ANTACID 500 MG PO CHEW
1.0000 | CHEWABLE_TABLET | Freq: Three times a day (TID) | ORAL | Status: DC
  Administered 2017-05-05 – 2017-05-06 (×2): 200 mg
  Filled 2017-05-04 (×2): qty 1

## 2017-05-04 MED ORDER — ACETAMINOPHEN 650 MG RE SUPP: 650.0000 mg | RECTAL | Status: DC | PRN

## 2017-05-04 MED ORDER — DOCUSATE SODIUM 50 MG/5ML PO LIQD
50.0000 mg | Freq: Every day | ORAL | Status: DC | PRN
Start: 2017-05-04 — End: 2017-05-06
  Filled 2017-05-04: qty 10

## 2017-05-04 MED ORDER — POTASSIUM PHOSPHATES 15 MMOLE/5ML IV SOLN
30.0000 mmol | Freq: Once | INTRAVENOUS | Status: AC
  Administered 2017-05-04: 30 mmol via INTRAVENOUS
  Filled 2017-05-04: qty 10

## 2017-05-04 MED ORDER — MAGNESIUM SULFATE 2 GM/50ML IV SOLN
2.0000 g | Freq: Once | INTRAVENOUS | Status: AC
  Administered 2017-05-04: 2 g via INTRAVENOUS
  Filled 2017-05-04: qty 50

## 2017-05-04 MED ORDER — ONDANSETRON HCL 4 MG/2ML IJ SOLN: 4.0000 mg | Freq: Four times a day (QID) | INTRAMUSCULAR | Status: DC | PRN

## 2017-05-04 NOTE — Progress Notes (Addendum)
[facility] TEAM 1 - Stepdown/ICU TEAM  SEQUITA WISE  TKZ:[id number] DOB: 05-Mar-1946 DOA: 04/20/2017 PCP: Marton Redwood, MD    Brief Narrative:  [name] w/ a Hx of Depression, Anxiety, Fibromyalgia, Tobacco abuse with COPD, Osteoporosis, Dyslipidemia, Chronic Systolic and Diastolic CHF, Bilateral Carotid stenosis, and Syncope who presented w/ 3-4 weeks of intractable nausea and vomiting without abdominal pain with an inability to keep anything down. She had been to the ER on several occasions with no definitive etiology found and each time briefly improved after the administration of IV fluids. Outpatient US of the abdomen and EGD were unrevealing.   On the day of her admission she was noted to be hypothermic, tachypneic, and bradycardic with pulses as low as 32 bpm.  Initial blood pressure was 79/39 with an MAP of 48.  She had a creatinine of 3.09. Lactic acid was 6.39. White count was 27,700 with left shift, platelets were 709,000. Troponin elevated at 2.05.   Subjective: The patient's mental status has not significantly changed since my exam yesterday.  She is resting with her eyes closed as I enter the room but she opens them and interacts with me.  Her orientation has not changed.  She herself denies any complaints.  She tells me she just wants to rest.  Assessment & Plan:  Acute encephalopathy / acute delirium  No CVA on CT head - B-12 and folate not deficient - PCO2 normal - appears to be slowly improving - suspect presently she is suffering w/ ICU delirium  - avoid sedatives as able  Intractable nausea and vomiting with weight loss Denies present nausea or vomiting, but has poor intake - follow w/ initiation of tube feeding - may require gastric emptying scan   Dysphagia Pt was given a trial of clears yesterday, but displayed evidence suggestive of aspiration in SLP f/u - have returned to NPO status and MBS is currently pending - regardless of her ability to swallow presently, her intake  is not presently adequate to allow her to recover - I spoke w/ her sister at the bedside at length, and we are going to place a Cortrak today to allow supplemental feeding to begin - SLP to cont to follow to rehab swallow which is likely deficient due to multiple intubations as well as severe generalized weakness w/ delirium   Acute hypoxic respiratory failure - severe emphysema - aspiration pneumonitis B LL Has completed a course of empiric antibiotic - appears stable from respiratory standpoint at present - requiring only minimal O2 support   Chronic systolic and diastolic congestive heart failure EF 45% - beginning to develop signif peripheral edema - give limited dose of diuretic again today and follow  Filed Weights   05/02/17 0502 05/03/17 0510 05/04/17 0700  Weight: 50 kg (110 lb 4.8 oz) 49.8 kg (109 lb 11.2 oz) 48.9 kg (107 lb 12.8 oz)    Sinus bradycardia Appears to have resolved - Cards has signed off - following on tele   Demand ischemia Trop peak at 4.39 - for outpt f/u   Anemia Hemoglobin stable at present no evidence of acute blood loss  Acute renal failure Resolved with volume resuscitation  Hypokalemia Corrected with replacement  Hypomagnesemia Continue to supplement to goal of 2.0  Hypophosphatemia  Replace via IV until NG placed   Hypocalcemia Ca2+ corrects to 8.0, therefore mildly low - supplement and follow  DVT prophylaxis: SQ heparin  Code Status: FULL CODE Family Communication: Spoke with sister at bedside  at length  Disposition Plan: tele - PT/OT - suspect SNF will be required   Consultants:  PCCM Cardiology  Procedures: 7/07 admit with N/V, weakness, hypotension  7/08 intubated 7/12 extubated 7/13 re-intubated 7/16 extubated  7/17 new onset acute confusion  Antimicrobials:  None presently  Objective: Blood pressure (!) 121/52, pulse 79, temperature 98 F (36.7 C), temperature source Oral, resp. rate 18, height 5\' 2"  (1.575 m), weight  48.9 kg (107 lb 12.8 oz), SpO2 100 %.  Intake/Output Summary (Last 24 hours) at 05/04/17 1509 Last data filed at 05/04/17 1408  Gross per 24 hour  Intake              360 ml  Output             1150 ml  Net             -790 ml   Filed Weights   05/02/17 0502 05/03/17 0510 05/04/17 0700  Weight: 50 kg (110 lb 4.8 oz) 49.8 kg (109 lb 11.2 oz) 48.9 kg (107 lb 12.8 oz)    Examination: General: No acute respiratory distress - alert when aroused  Lungs: Clear to auscultation bilaterally - no wheezing  Cardiovascular: RRR - no gallup or rub  Abdomen: Nontender, nondistended, soft, bowel sounds positive, no rebound, no ascites, no appreciable mass Extremities: 1+ edema B feet - trace LE edema B  CBC:  Recent Labs Lab 04/30/17 0358 05/01/17 0332 05/02/17 0550 05/03/17 0327 05/04/17 0327  WBC 9.5 8.7 10.4 9.4 11.1*  HGB 9.4* 9.2* 9.4* 9.3* 9.4*  HCT 29.5* 28.2* 30.0* 30.5* 30.6*  MCV 88.6 89.0 90.4 91.0 89.7  PLT 371 336 295 278 621   Basic Metabolic Panel:  Recent Labs Lab 04/28/17 0357 04/28/17 2110 04/29/17 0410 04/30/17 0358  05/01/17 1606 05/02/17 0550 05/02/17 2044 05/03/17 0327 05/04/17 0327  NA 135 132* 133* 135  < > 135 137 139 136 138  K 3.0* 3.5 3.5 2.9*  < > 3.6 3.1* 4.0 4.1 3.6  CL 95* 95* 93* 91*  < > 94* 99* 100* 101 105  CO2 28 29 28 28   < > 34* 30 29 28 26   GLUCOSE 93 95 100* 111*  < > 97 76 86 101* 87  BUN 30* 32* 28* 27*  < > 23* 19 14 14 10   CREATININE 1.62* 1.58* 1.50* 1.37*  < > 1.09* 0.90 0.88 0.83 0.62  CALCIUM 6.5* 6.5* 6.3* 5.8*  < > 5.9* 6.6* 6.8* 6.4* 6.3*  MG 1.5* 2.5* 2.3 1.8  --   --   --   --  1.6* 1.7  PHOS 3.0 3.2 3.5  --   --   --   --   --   --  1.8*  < > = values in this interval not displayed. GFR: Estimated Creatinine Clearance: 48.3 mL/min (by C-G formula based on SCr of 0.62 mg/dL).  Liver Function Tests:  Recent Labs Lab 04/28/17 0357 05/02/17 1849 05/02/17 2044 05/04/17 0327  AST  --   --  46* 41  ALT  --   --   61* 45  ALKPHOS  --   --  143* 110  BILITOT  --   --  1.0 0.9  PROT  --   --  4.9* 4.4*  ALBUMIN 1.9* 2.1* 2.0* 1.9*    CBG:  Recent Labs Lab 05/03/17 1224 05/03/17 1724 05/03/17 2014 05/04/17 0744 05/04/17 1144  GLUCAP 95 130* 143* 121* 93  Recent Results (from the past 240 hour(s))  Culture, blood (routine x 2)     Status: None   Collection Time: 04/26/17  7:11 AM  Result Value Ref Range Status   Specimen Description BLOOD RIGHT HAND  Final   Special Requests IN PEDIATRIC BOTTLE Blood Culture adequate volume  Final   Culture NO GROWTH 5 DAYS  Final   Report Status 05/01/2017 FINAL  Final  Culture, respiratory (NON-Expectorated)     Status: None   Collection Time: 04/26/17  7:24 AM  Result Value Ref Range Status   Specimen Description TRACHEAL ASPIRATE  Final   Special Requests NONE  Final   Gram Stain   Final    FEW WBC PRESENT,BOTH PMN AND MONONUCLEAR NO SQUAMOUS EPITHELIAL CELLS SEEN NO ORGANISMS SEEN    Culture RARE CANDIDA ALBICANS  Final   Report Status 04/28/2017 FINAL  Final  Culture, blood (routine x 2)     Status: None   Collection Time: 04/26/17  8:05 AM  Result Value Ref Range Status   Specimen Description BLOOD RIGHT ANTECUBITAL  Final   Special Requests IN PEDIATRIC BOTTLE Blood Culture adequate volume  Final   Culture NO GROWTH 5 DAYS  Final   Report Status 05/01/2017 FINAL  Final  C difficile quick scan w PCR reflex     Status: Abnormal   Collection Time: 04/27/17 11:06 AM  Result Value Ref Range Status   C Diff antigen POSITIVE (A) NEGATIVE Final   C Diff toxin NEGATIVE NEGATIVE Final   C Diff interpretation Results are indeterminate. See PCR results.  Final    Comment: PERFORMED ON FORMED STOOL PER DR. MANNAM REQUEST     Scheduled Meds: . arformoterol  15 mcg Nebulization BID  . aspirin  325 mg Per Tube Daily  . budesonide (PULMICORT) nebulizer solution  0.5 mg Nebulization BID  . calcium carbonate  1 tablet Oral TID  . chlorhexidine   15 mL Mouth Rinse BID  . Chlorhexidine Gluconate Cloth  6 each Topical Daily  . clotrimazole  10 mg Oral 5 X Daily  . feeding supplement (ENSURE ENLIVE)  237 mL Oral TID BM  . guaiFENesin  600 mg Oral BID  . heparin subcutaneous  5,000 Units Subcutaneous Q8H  . ipratropium-albuterol  3 mL Nebulization BID  . mouth rinse  15 mL Mouth Rinse q12n4p  . pantoprazole sodium  40 mg Per Tube Daily  . sennosides  5 mL Oral BID  . sodium chloride flush  10-40 mL Intracatheter Q12H     LOS: 14 days   Cherene Altes, MD [facility] Office  [phone] Pager - Text Page per Amion as per below:  On-Call/Text Page:      [email or web address]      password [email or web address]  If 7PM-7AM, please contact night-coverage [email or web address] Password [email or web address] 05/04/2017, 3:09 PM

## 2017-05-04 NOTE — Progress Notes (Signed)
Patient stable during 7 a to 7 p shift, able to wean down to room air.  12 french NG placed by this RN, began receiving TF for nutrition.  Family at bedside.

## 2017-05-04 NOTE — Clinical Social Work Note (Signed)
CSW called husband to notify him that [facility] is unable to accept patient for rehab. He is gong to review SNF bed offers list with his children before making a decision.  [place] Scrape, [place]

## 2017-05-04 NOTE — Progress Notes (Addendum)
  Speech Language Pathology Treatment: Dysphagia  Patient Details Name: Alexandra House MRN: [id number] DOB: 04/15/1946 Today's Date: 05/04/2017 Time: 4827-0786 SLP Time Calculation (min) (ACUTE ONLY): 23 min  Assessment / Plan / Recommendation Clinical Impression  Patient seen for dysphagia follow-up; oldest son present at bedside. MD upgraded to clear liquids; per charting family has been noncompliant with NPO recommendations. Today pt continues to exhibit decreased level of alertness, requires verbal cues for alertness. Opens her eyes upon command, but keeps her eyes closed for the majority of today's session. SLP administered ice chips, thin liquids via cup and straw. Pt displays oral holding, anterior loss with 50% of trials. Pt with weak, congested coughing at baseline, however demonstrates immediate cough with ice chips, thin liquids, concerning for reduced airway protection. Pt at high risk for aspiration. SLP provided extensive education to pt's son re: her risks for aspiration given multiple intubations, swallow physiology, instrumental examinations. Recommend NPO pending MBS for evaluation of swallowing function; pt's son in agreement. Test has been ordered and will be performed as pt alertness, fluoro schedule permit. RN reports flucuating LOA; to page SLP as pt becomes more aroused. Pt may benefit from temporary alternative means of nutrition/hydration.     HPI HPI: Alexandra House an 71 y.o.femalepast medical history of COPD, osteoporosis, fibromyalgia, anxiety relates 4 weeks of intractable nausea vomiting without pain she had an EGD and abdominal ultrasound which were unremarkable. Per chart pt has lost 10 pounds over the last 4 weeks for nausea and vomiting. Found to have acute renal failure. Intubated 7/8-7/12 and reintubated 7/12-7/16. CXR diffuse bilateral pulmonary infiltrates and small bilateral pleural effusions suggesting mild CHF.        SLP Plan  MBS;New goals to be  determined pending instrumental study       Recommendations  Diet recommendations: NPO Medication Administration: Via alternative means                Oral Care Recommendations: Oral care prior to ice chip/H20 Follow up Recommendations: 24 hour supervision/assistance SLP Visit Diagnosis: Dysphagia, unspecified (R13.10) Plan: MBS;New goals to be determined pending instrumental study       [place], [place], [place] Pathologist [phone]  [name] 05/04/2017, 10:32 AM

## 2017-05-04 NOTE — Plan of Care (Signed)
Problem: Tissue Perfusion: Goal: Risk factors for ineffective tissue perfusion will decrease Outcome: Progressing Able to wean off O2   Problem: Nutrition: Goal: Adequate nutrition will be maintained Outcome: Progressing TF started this shift

## 2017-05-04 NOTE — Progress Notes (Signed)
Modified Barium Swallow Progress Note  Patient Details  Name: Alexandra House MRN: [id number] Date of Birth: 1945-12-31  Today's Date: 05/04/2017  Modified Barium Swallow completed.  Full report located under Chart Review in the Imaging Section.  Brief recommendations include the following:  Clinical Impression  Patient presents with moderate-severe oral and moderate pharyngeal dysphagia which appears mostly cognitively and sensory-based. She presents with moderate-severe risk for aspiration. Oral stage of swallowing characterized by anterior oral spillage, weak lingual manipulation, delayed anterior to posterior transit, reduced bolus cohesion with premature spillage of liquids to the pyriform sinuses, piecemeal deglutition, oral residue which spills into the pharynx after the swallow. No overt aspiration observed with thin or nectar thick liquids, however with cough x3 between frames. Suspect compromised airway protection with residue. Pharyngeal stage of swallowing noted for reduced base of tongue retraction with moderate residue in the valleculae, pyriform sinuses. With honey-thick liquids, trace aspiration x1 of residue during subsequent swallow. Pt is at risk of aspiration of all consistencies given poor oral containment, delayed swallow initiation, and pharyngeal residue. Unable to trial any compensatory techniques during this examination as pt unable to follow positional cues. Pt's sister present for the examination; provided extensive education re: results of assessment, risks for aspiration and recommendations that pt remain NPO at this time with the exception of ice chips after thorough oral care. Anticipate pt will be able to initiate diet as her mental status improves, but given her presentation today she remains at moderate-severe risk for aspiration. SLP will f/u next date for improvements at bedside.     Swallow Evaluation Recommendations       SLP Diet Recommendations:  NPO;Alternative means - temporary;Ice chips PRN after oral care                       Oral Care Recommendations: Oral care QID;Other (Comment) (oral care prior to ice chips)      Deneise Lever, MS, CCC-SLP Speech-Language Pathologist [phone]  [name] 05/04/2017,4:18 PM

## 2017-05-05 DIAGNOSIS — R1312 Dysphagia, oropharyngeal phase: Secondary | ICD-10-CM

## 2017-05-05 LAB — GLUCOSE, CAPILLARY
GLUCOSE-CAPILLARY: 129 mg/dL — AB (ref 65–99)
GLUCOSE-CAPILLARY: 89 mg/dL (ref 65–99)
GLUCOSE-CAPILLARY: 95 mg/dL (ref 65–99)
Glucose-Capillary: 89 mg/dL (ref 65–99)
Glucose-Capillary: 89 mg/dL (ref 65–99)
Glucose-Capillary: 98 mg/dL (ref 65–99)

## 2017-05-05 LAB — CBC
HEMATOCRIT: 31.3 % — AB (ref 36.0–46.0)
Hemoglobin: 9.9 g/dL — ABNORMAL LOW (ref 12.0–15.0)
MCH: 28.1 pg (ref 26.0–34.0)
MCHC: 31.6 g/dL (ref 30.0–36.0)
MCV: 88.9 fL (ref 78.0–100.0)
PLATELETS: 205 10*3/uL (ref 150–400)
RBC: 3.52 MIL/uL — AB (ref 3.87–5.11)
RDW: 21.9 % — AB (ref 11.5–15.5)
WBC: 12.6 10*3/uL — AB (ref 4.0–10.5)

## 2017-05-05 LAB — COMPREHENSIVE METABOLIC PANEL
ALT: 48 U/L (ref 14–54)
AST: 54 U/L — ABNORMAL HIGH (ref 15–41)
Albumin: 2.1 g/dL — ABNORMAL LOW (ref 3.5–5.0)
Alkaline Phosphatase: 124 U/L (ref 38–126)
Anion gap: 10 (ref 5–15)
BILIRUBIN TOTAL: 0.9 mg/dL (ref 0.3–1.2)
BUN: 9 mg/dL (ref 6–20)
CHLORIDE: 102 mmol/L (ref 101–111)
CO2: 24 mmol/L (ref 22–32)
Calcium: 6.6 mg/dL — ABNORMAL LOW (ref 8.9–10.3)
Creatinine, Ser: 0.64 mg/dL (ref 0.44–1.00)
Glucose, Bld: 97 mg/dL (ref 65–99)
POTASSIUM: 4.1 mmol/L (ref 3.5–5.1)
Sodium: 136 mmol/L (ref 135–145)
TOTAL PROTEIN: 5 g/dL — AB (ref 6.5–8.1)

## 2017-05-05 LAB — PHOSPHORUS: PHOSPHORUS: 3.9 mg/dL (ref 2.5–4.6)

## 2017-05-05 LAB — MAGNESIUM: Magnesium: 2 mg/dL (ref 1.7–2.4)

## 2017-05-05 MED ORDER — KCL IN DEXTROSE-NACL 20-5-0.9 MEQ/L-%-% IV SOLN
INTRAVENOUS | Status: DC
  Administered 2017-05-05: 16:00:00 via INTRAVENOUS
  Filled 2017-05-05 (×3): qty 1000

## 2017-05-05 MED ORDER — FUROSEMIDE 10 MG/ML IJ SOLN
20.0000 mg | Freq: Once | INTRAMUSCULAR | Status: AC
Start: 2017-05-05 — End: 2017-05-05
  Administered 2017-05-05: 20 mg via INTRAVENOUS
  Filled 2017-05-05: qty 2

## 2017-05-05 NOTE — Progress Notes (Signed)
[facility] TEAM 1 - Stepdown/ICU TEAM  LENDORA KEYS  FYB:[id number] DOB: 1946-07-08 DOA: 04/20/2017 PCP: Marton Redwood, MD    Brief Narrative:  [name] w/ a Hx of Depression, Anxiety, Fibromyalgia, Tobacco abuse with COPD, Osteoporosis, Dyslipidemia, Chronic Systolic and Diastolic CHF, Bilateral Carotid stenosis, and Syncope who presented w/ 3-4 weeks of intractable nausea and vomiting without abdominal pain with an inability to keep anything down. She had been to the ER on several occasions with no definitive etiology found and each time briefly improved after the administration of IV fluids. Outpatient US of the abdomen and EGD were unrevealing.   On the day of her admission she was noted to be hypothermic, tachypneic, and bradycardic with pulses as low as 32 bpm.  Initial blood pressure was 79/39 with an MAP of 48.  She had a creatinine of 3.09. Lactic acid was 6.39. White count was 27,700 with left shift, platelets were 709,000. Troponin elevated at 2.05.   Subjective: The patient's mental status has dramatically improved over the last 12 hours.  She is alert oriented and sitting up in a bedside chair.  She tells me she is quite hungry.  She apologizes for having pulled out her NG tube early this morning.  She denies chest pain shortness breath fevers chills nausea or vomiting.  I have had a lengthy discussion at the bedside with the patient and her husband concerning her options regarding her nutrition/dysphagia.  Assessment & Plan:  Acute encephalopathy / acute delirium  No CVA on CT head - B-12 and folate not deficient - PCO2 normal - appears to be slowly improving - suspect presently she is suffering w/ ICU delirium  - avoid sedatives as able - making great headway at this time   Intractable nausea and vomiting with weight loss Denies present nausea or vomiting - follow as diet advanced or consistent tube feeding begun - may require gastric emptying scan   Dysphagia Gave pt option of  replacing NG today, or re-assessing swallow in AM to either allow diet or place cortrak at that time - she chose the later, which I agree is the better option - SLP to cont to follow to rehab swallow which is likely deficient due to multiple intubations as well as delirium - with her singif improved mental status since yesterday I am hopeful her swallow may have recovered even as soon as tomorrow   Acute hypoxic respiratory failure - severe emphysema - aspiration pneumonitis B LL Has completed a course of empiric antibiotic - appears stable from respiratory standpoint at present - weaned to RA   Chronic systolic and diastolic congestive heart failure EF 45% - peripheral edema improved after one dose of diuretic - give limited dose of diuretic again today once IVF begun (being given purely for dextrose and not volume) Filed Weights   05/03/17 0510 05/04/17 0700 05/05/17 0500  Weight: 49.8 kg (109 lb 11.2 oz) 48.9 kg (107 lb 12.8 oz) 47.6 kg (104 lb 14.4 oz)    Sinus bradycardia Appears to have resolved - Cards has signed off - following on tele   Demand ischemia Trop peak at 4.39 - for outpt f/u   Anemia Hemoglobin stable at present w/ no evidence of acute blood loss  Acute renal failure Resolved with volume resuscitation  Hypokalemia Corrected with replacement  Hypomagnesemia Continue to supplement to goal of 2.0  Hypophosphatemia  Replaced via IV   Hypocalcemia Ca2+ corrects to 8.0, therefore mildly low - cont to supplement  and follow  DVT prophylaxis: SQ heparin  Code Status: FULL CODE Family Communication: Spoke with Husband at bedside at length  Disposition Plan: tele - PT/OT - suspect SNF will be required   Consultants:  PCCM Cardiology  Procedures: 7/07 admit with N/V, weakness, hypotension  7/08 intubated 7/12 extubated 7/13 re-intubated 7/16 extubated  7/17 new onset acute confusion  Antimicrobials:  None presently  Objective: Blood pressure (!)  129/58, pulse 81, temperature 98.2 F (36.8 C), temperature source Oral, resp. rate 18, height 5\' 2"  (1.575 m), weight 47.6 kg (104 lb 14.4 oz), SpO2 100 %.  Intake/Output Summary (Last 24 hours) at 05/05/17 1448 Last data filed at 05/05/17 1350  Gross per 24 hour  Intake           442.33 ml  Output             3900 ml  Net         -3457.67 ml   Filed Weights   05/03/17 0510 05/04/17 0700 05/05/17 0500  Weight: 49.8 kg (109 lb 11.2 oz) 48.9 kg (107 lb 12.8 oz) 47.6 kg (104 lb 14.4 oz)    Examination: General: No acute respiratory distress - alert and conversant  Lungs: Clear to auscultation B w/o wheezing  Cardiovascular: RRR Abdomen: NT/ND, soft, bowel sounds positive, no rebound, no ascites, no appreciable mass Extremities: trace B LE edema  CBC:  Recent Labs Lab 05/01/17 0332 05/02/17 0550 05/03/17 0327 05/04/17 0327 05/05/17 0217  WBC 8.7 10.4 9.4 11.1* 12.6*  HGB 9.2* 9.4* 9.3* 9.4* 9.9*  HCT 28.2* 30.0* 30.5* 30.6* 31.3*  MCV 89.0 90.4 91.0 89.7 88.9  PLT 336 295 278 241 004   Basic Metabolic Panel:  Recent Labs Lab 04/28/17 2110 04/29/17 0410 04/30/17 0358  05/02/17 0550 05/02/17 2044 05/03/17 0327 05/04/17 0327 05/05/17 0217  NA 132* 133* 135  < > 137 139 136 138 136  K 3.5 3.5 2.9*  < > 3.1* 4.0 4.1 3.6 4.1  CL 95* 93* 91*  < > 99* 100* 101 105 102  CO2 29 28 28   < > 30 29 28 26 24   GLUCOSE 95 100* 111*  < > 76 86 101* 87 97  BUN 32* 28* 27*  < > 19 14 14 10 9   CREATININE 1.58* 1.50* 1.37*  < > 0.90 0.88 0.83 0.62 0.64  CALCIUM 6.5* 6.3* 5.8*  < > 6.6* 6.8* 6.4* 6.3* 6.6*  MG 2.5* 2.3 1.8  --   --   --  1.6* 1.7 2.0  PHOS 3.2 3.5  --   --   --   --   --  1.8* 3.9  < > = values in this interval not displayed. GFR: Estimated Creatinine Clearance: 47.1 mL/min (by C-G formula based on SCr of 0.64 mg/dL).  Liver Function Tests:  Recent Labs Lab 05/02/17 1849 05/02/17 2044 05/04/17 0327 05/05/17 0217  AST  --  46* 41 54*  ALT  --  61* 45 48    ALKPHOS  --  143* 110 124  BILITOT  --  1.0 0.9 0.9  PROT  --  4.9* 4.4* 5.0*  ALBUMIN 2.1* 2.0* 1.9* 2.1*    CBG:  Recent Labs Lab 05/04/17 1604 05/05/17 0103 05/05/17 0604 05/05/17 0727 05/05/17 1148  GLUCAP 84 129* 89 89 98    Recent Results (from the past 240 hour(s))  Culture, blood (routine x 2)     Status: None   Collection Time: 04/26/17  7:11  AM  Result Value Ref Range Status   Specimen Description BLOOD RIGHT HAND  Final   Special Requests IN PEDIATRIC BOTTLE Blood Culture adequate volume  Final   Culture NO GROWTH 5 DAYS  Final   Report Status 05/01/2017 FINAL  Final  Culture, respiratory (NON-Expectorated)     Status: None   Collection Time: 04/26/17  7:24 AM  Result Value Ref Range Status   Specimen Description TRACHEAL ASPIRATE  Final   Special Requests NONE  Final   Gram Stain   Final    FEW WBC PRESENT,BOTH PMN AND MONONUCLEAR NO SQUAMOUS EPITHELIAL CELLS SEEN NO ORGANISMS SEEN    Culture RARE CANDIDA ALBICANS  Final   Report Status 04/28/2017 FINAL  Final  Culture, blood (routine x 2)     Status: None   Collection Time: 04/26/17  8:05 AM  Result Value Ref Range Status   Specimen Description BLOOD RIGHT ANTECUBITAL  Final   Special Requests IN PEDIATRIC BOTTLE Blood Culture adequate volume  Final   Culture NO GROWTH 5 DAYS  Final   Report Status 05/01/2017 FINAL  Final  C difficile quick scan w PCR reflex     Status: Abnormal   Collection Time: 04/27/17 11:06 AM  Result Value Ref Range Status   C Diff antigen POSITIVE (A) NEGATIVE Final   C Diff toxin NEGATIVE NEGATIVE Final   C Diff interpretation Results are indeterminate. See PCR results.  Final    Comment: PERFORMED ON FORMED STOOL PER DR. MANNAM REQUEST     Scheduled Meds: . arformoterol  15 mcg Nebulization BID  . aspirin  325 mg Per Tube Daily  . budesonide (PULMICORT) nebulizer solution  0.5 mg Nebulization BID  . calcium carbonate  1 tablet Per Tube TID  . chlorhexidine  15 mL  Mouth Rinse BID  . Chlorhexidine Gluconate Cloth  6 each Topical Daily  . clotrimazole  10 mg Oral 5 X Daily  . heparin subcutaneous  5,000 Units Subcutaneous Q8H  . ipratropium-albuterol  3 mL Nebulization BID  . mouth rinse  15 mL Mouth Rinse q12n4p  . pantoprazole sodium  40 mg Per Tube Daily  . sennosides  5 mL Per Tube BID  . sodium chloride flush  10-40 mL Intracatheter Q12H     LOS: 15 days   Cherene Altes, MD [facility] Office  [phone] Pager - Text Page per Amion as per below:  On-Call/Text Page:      [email or web address]      password [email or web address]  If 7PM-7AM, please contact night-coverage [email or web address] Password [facility] 05/05/2017, 2:48 PM

## 2017-05-05 NOTE — Progress Notes (Signed)
Physical Therapy Treatment Patient Details Name: Alexandra House MRN: [id number] DOB: 1946/10/02 Today's Date: 05/05/2017    History of Present Illness Pt adm with septic shock likely due to aspiration PNA. Pt intubated 7/8 and extubated 7/12. Reintubated 7/13 and extubated 7/16. Pt with bradycardia requiring external pacing on admission. PMH - chf, copd, osteoporosis, depression, syncope    PT Comments    Patient continues to be very limited but she did better today. She was able to stand and take steps to a chair. She was left in the chair with family present and a chair alarm. She was fatigued after the transfer and standing for clean up. She would benefit from further skilled therapy at a SNF.   Follow Up Recommendations  SNF (evaluated for CIR but SNF reccomended )     Equipment Recommendations  Rolling walker with 5" wheels    Recommendations for Other Services       Precautions / Restrictions Precautions Precautions: Fall Restrictions Weight Bearing Restrictions: No    Mobility  Bed Mobility Overal bed mobility: Needs Assistance Bed Mobility: Supine to Sit;Sit to Supine     Supine to sit: Mod assist     General bed mobility comments: Patient required mod a to sit up. She required mod vc's and TC's to scoot to the edge of the bed. Once to the edge of the bed. She was able to gain her balance after a few seconds.   Transfers Overall transfer level: Needs assistance Equipment used: Rolling walker (2 wheeled) Transfers: Sit to/from Stand Sit to Stand: Mod assist         General transfer comment: mod a and mod cuing for hand placement andto remain standing. Patient was able to remain standing for 30 seconds before having to sit down. She was able to take steps over to her chair with mod a for balance. She was fatigued after her transfer.  Ambulation/Gait Ambulation/Gait assistance: +2 physical assistance;Mod assist Ambulation Distance (Feet): 2 Feet Assistive  device: Rolling walker (2 wheeled) Gait Pattern/deviations: Decreased step length - right;Decreased step length - left;Shuffle;Trunk flexed Gait velocity: decr Gait velocity interpretation: Below normal speed for age/gender General Gait Details: Patient required mod a for strength and mod cuing for stability. She fatigued quickly and had to sit before she was in good position.    Stairs            Wheelchair Mobility    Modified Rankin (Stroke Patients Only)       Balance Overall balance assessment: Needs assistance Sitting-balance support: Single extremity supported Sitting balance-Leahy Scale: Poor Sitting balance - Comments: sat EOB with min A for balance. Improved balance the longer she sat   Standing balance support: Bilateral upper extremity supported Standing balance-Leahy Scale: Poor Standing balance comment: mod a to reamin standing                             Cognition Arousal/Alertness: Lethargic Behavior During Therapy: Flat affect Overall Cognitive Status: Impaired/Different from baseline Area of Impairment: Orientation                   Current Attention Level: Focused           General Comments: appears to be better cognition today.. Could restate name and date of brith and was aware of were she was and the situation       Exercises      General Comments  Pertinent Vitals/Pain Pain Assessment: Faces Faces Pain Scale: No hurt    Home Living                      Prior Function            PT Goals (current goals can now be found in the care plan section) Acute Rehab PT Goals Patient Stated Goal: Not stated PT Goal Formulation: With patient Time For Goal Achievement: 05/14/17 Potential to Achieve Goals: Good Progress towards PT goals: Progressing toward goals    Frequency    Min 3X/week      PT Plan Current plan remains appropriate    Co-evaluation              AM-PAC PT "6 Clicks"  Daily Activity  Outcome Measure  Difficulty turning over in bed (including adjusting bedclothes, sheets and blankets)?: A Lot Difficulty moving from lying on back to sitting on the side of the bed? : A Lot Difficulty sitting down on and standing up from a chair with arms (e.g., wheelchair, bedside commode, etc,.)?: A Lot Help needed moving to and from a bed to chair (including a wheelchair)?: A Lot Help needed walking in hospital room?: Total Help needed climbing 3-5 steps with a railing? : Total 6 Click Score: 10    End of Session Equipment Utilized During Treatment: Gait belt;Oxygen Activity Tolerance: Patient limited by fatigue;Patient limited by lethargy Patient left: in chair;with call bell/phone within reach;with chair alarm set;with family/visitor present Nurse Communication: Mobility status PT Visit Diagnosis: Unsteadiness on feet (R26.81);Muscle weakness (generalized) (M62.81);Difficulty in walking, not elsewhere classified (R26.2)     Time: 1004-1020 PT Time Calculation (min) (ACUTE ONLY): 16 min  Charges:  $Therapeutic Activity: 8-22 mins                    G Codes:         Carney Living PT DPT  05/05/2017, 11:06 AM

## 2017-05-05 NOTE — Plan of Care (Signed)
Problem: Physical Regulation: Goal: Ability to maintain clinical measurements within normal limits will improve Outcome: Progressing VSS, maintains oxygen in the 90s on room air   Problem: Activity: Goal: Risk for activity intolerance will decrease Outcome: Progressing Up to chair with max assist   Problem: Nutrition: Goal: Adequate nutrition will be maintained Outcome: Not Met (add Reason) Patient pulled out NG tube, awaiting further orders for nutrition    

## 2017-05-05 NOTE — Plan of Care (Signed)
Problem: Activity: Goal: Risk for activity intolerance will decrease Outcome: Progressing Pt. Able to get up to use [facility] today. Also up to recliner chair.

## 2017-05-05 NOTE — Progress Notes (Signed)
VSS 7 a to 7 p shift, patient alert and oriented, sat up in chair most of the shift.  Back to bed with 2 assist.  Foley removed, patient able to urinate 250 ccs light colored urine prior to end of shift.  Son in room, reiterated to him that Dr. Thereasa Solo has ordered repeat speech eval for 05/06/17 and patient is to be NPO until then.  Son and patient verbalize understanding.

## 2017-05-06 LAB — GLUCOSE, CAPILLARY
GLUCOSE-CAPILLARY: 93 mg/dL (ref 65–99)
GLUCOSE-CAPILLARY: 88 mg/dL (ref 65–99)
GLUCOSE-CAPILLARY: 90 mg/dL (ref 65–99)
Glucose-Capillary: 101 mg/dL — ABNORMAL HIGH (ref 65–99)
Glucose-Capillary: 81 mg/dL (ref 65–99)
Glucose-Capillary: 88 mg/dL (ref 65–99)

## 2017-05-06 MED ORDER — ONDANSETRON HCL 4 MG PO TABS: 4.0000 mg | ORAL_TABLET | Freq: Four times a day (QID) | ORAL | Status: DC | PRN

## 2017-05-06 MED ORDER — ACETAMINOPHEN 160 MG/5ML PO SOLN
650.0000 mg | Freq: Four times a day (QID) | ORAL | Status: DC | PRN
  Filled 2017-05-06: qty 20.3

## 2017-05-06 MED ORDER — WHITE PETROLATUM GEL
Status: DC | PRN
  Filled 2017-05-06: qty 28.35

## 2017-05-06 MED ORDER — RESOURCE THICKENUP CLEAR PO POWD
ORAL | Status: DC | PRN
  Filled 2017-05-06: qty 125

## 2017-05-06 MED ORDER — PANTOPRAZOLE SODIUM 40 MG PO TBEC
40.0000 mg | DELAYED_RELEASE_TABLET | Freq: Every day | ORAL | Status: DC
  Administered 2017-05-07 – 2017-05-09 (×3): 40 mg via ORAL
  Filled 2017-05-06 (×3): qty 1

## 2017-05-06 MED ORDER — DOCUSATE SODIUM 50 MG/5ML PO LIQD
50.0000 mg | Freq: Every day | ORAL | Status: DC | PRN
  Filled 2017-05-06: qty 10

## 2017-05-06 MED ORDER — ONDANSETRON HCL 4 MG/2ML IJ SOLN: 4.0000 mg | Freq: Four times a day (QID) | INTRAMUSCULAR | Status: DC | PRN

## 2017-05-06 MED ORDER — CALCIUM CARBONATE ANTACID 500 MG PO CHEW
1.0000 | CHEWABLE_TABLET | Freq: Three times a day (TID) | ORAL | Status: DC
  Administered 2017-05-06 – 2017-05-09 (×8): 200 mg via ORAL
  Filled 2017-05-06 (×8): qty 1

## 2017-05-06 MED ORDER — ASPIRIN 325 MG PO TABS
325.0000 mg | ORAL_TABLET | Freq: Every day | ORAL | Status: DC
Start: 2017-05-07 — End: 2017-05-09
  Administered 2017-05-07 – 2017-05-09 (×3): 325 mg via ORAL
  Filled 2017-05-06 (×3): qty 1

## 2017-05-06 MED ORDER — SENNOSIDES 8.8 MG/5ML PO SYRP
5.0000 mL | ORAL_SOLUTION | Freq: Two times a day (BID) | ORAL | Status: DC
  Administered 2017-05-06 – 2017-05-09 (×6): 5 mL via ORAL
  Filled 2017-05-06 (×6): qty 5

## 2017-05-06 MED ORDER — ENSURE ENLIVE PO LIQD
237.0000 mL | Freq: Three times a day (TID) | ORAL | Status: DC
  Administered 2017-05-06 – 2017-05-09 (×7): 237 mL via ORAL

## 2017-05-06 NOTE — Progress Notes (Signed)
Physical Therapy Treatment Patient Details Name: Alexandra House MRN: [id number] DOB: [birth year] Today's Date: 05/06/2017    History of Present Illness Pt adm with septic shock likely due to aspiration PNA. Pt intubated 7/8 and extubated 7/12. Reintubated 7/13 and extubated 7/16. Pt with bradycardia requiring external pacing on admission. PMH - chf, copd, osteoporosis, depression, syncope    PT Comments    Patient is making gradual progress toward mobility goals but does continue to demonstrate decreased activity tolerance and strength. Pt tolerated short distance gait this session. +2 for safety with OOB mobility.  Current plan remains appropriate.   Follow Up Recommendations  SNF     Equipment Recommendations  Rolling walker with 5" wheels    Recommendations for Other Services       Precautions / Restrictions Precautions Precautions: Fall Restrictions Weight Bearing Restrictions: No    Mobility  Bed Mobility Overal bed mobility: Needs Assistance Bed Mobility: Supine to Sit     Supine to sit: Mod assist     General bed mobility comments: assist to bring bilat LE to EOB and elevate trunk into sitting; cues for sequencing  Transfers Overall transfer level: Needs assistance Equipment used: Rolling walker (2 wheeled) Transfers: Sit to/from Stand Sit to Stand: Min assist;Mod assist         General transfer comment: min A to power up into standing and mod A to gain balance upon standing   Ambulation/Gait Ambulation/Gait assistance: Mod assist;+2 safety/equipment Ambulation Distance (Feet): 10 Feet Assistive device: Rolling walker (2 wheeled) Gait Pattern/deviations: Decreased step length - right;Decreased step length - left;Trunk flexed;Step-through pattern;Decreased dorsiflexion - right;Decreased dorsiflexion - left;Narrow base of support Gait velocity: decr   General Gait Details: assist to steady and for management of RW; encouragement to increase distance and  cues for breathing and safe use of AD; chair follow    Stairs            Wheelchair Mobility    Modified Rankin (Stroke Patients Only)       Balance Overall balance assessment: Needs assistance Sitting-balance support: No upper extremity supported;Feet supported Sitting balance-Leahy Scale: Fair     Standing balance support: Bilateral upper extremity supported Standing balance-Leahy Scale: Poor                              Cognition Arousal/Alertness: Awake/alert Behavior During Therapy: Flat affect Overall Cognitive Status: Impaired/Different from baseline Area of Impairment: Problem solving                             Problem Solving: Slow processing;Decreased initiation;Requires verbal cues        Exercises      General Comments General comments (skin integrity, edema, etc.): grandson present; Spo2 98% on RA      Pertinent Vitals/Pain Pain Assessment: No/denies pain    Home Living                      Prior Function            PT Goals (current goals can now be found in the care plan section) Acute Rehab PT Goals PT Goal Formulation: With patient Time For Goal Achievement: 05/14/17 Potential to Achieve Goals: Good Progress towards PT goals: Progressing toward goals    Frequency    Min 3X/week      PT Plan Current plan remains appropriate  Co-evaluation              AM-PAC PT "6 Clicks" Daily Activity  Outcome Measure  Difficulty turning over in bed (including adjusting bedclothes, sheets and blankets)?: A Lot Difficulty moving from lying on back to sitting on the side of the bed? : Total Difficulty sitting down on and standing up from a chair with arms (e.g., wheelchair, bedside commode, etc,.)?: Total Help needed moving to and from a bed to chair (including a wheelchair)?: A Lot Help needed walking in hospital room?: A Lot Help needed climbing 3-5 steps with a railing? : A Lot 6 Click Score:  10    End of Session Equipment Utilized During Treatment: Gait belt Activity Tolerance: Patient limited by fatigue Patient left: in chair;with call bell/phone within reach;with chair alarm set;with family/visitor present Nurse Communication: Mobility status PT Visit Diagnosis: Unsteadiness on feet (R26.81);Muscle weakness (generalized) (M62.81);Difficulty in walking, not elsewhere classified (R26.2)     Time: 8416-6063 PT Time Calculation (min) (ACUTE ONLY): 31 min  Charges:  $Gait Training: 8-22 mins $Therapeutic Activity: 8-22 mins                    G Codes:       Earney Navy, PTA Pager: [phone]     Darliss Cheney 05/06/2017, 1:29 PM

## 2017-05-06 NOTE — Progress Notes (Signed)
**Note De-Identified Alexandra Obfuscation**   Speech Language Pathology Treatment: Dysphagia  Patient Details Name: Alexandra House MRN: [id number] DOB: 01-28-1946 Today's Date: 05/06/2017 Time: 0931-1216 SLP Time Calculation (min) (ACUTE ONLY): 14 min  Assessment / Plan / Recommendation Clinical Impression  SLP familiar to pt from initial swallow assessment last week; noted MBS yesterday without penetration or aspiration observed with thin and nectar with intermittent coughing during study.   + aspiration of honey thick and pharyngeal residue. She was unable to complete compensatory strategies due to mental status.   New order today stating mental status improved today which pt's husband affirms. Pt able to converse with this therapist, follow directions. She consumed nectar juice, puree and Dys 3 with delayed coughing which this SLP although suspect due to COPD versus airway compromise. Delayed oral transit and suspected delayed swallow initiation. Will initiate Dys 2, nectar thick liquids. Educated husband re: thickened liquids, procedure to thicken and aspiration precautions. Reiterated 2 swallows given residue on MBS. Will follow closely.    HPI HPI: Alexandra House an 71 y.o.femalepast medical history of COPD, osteoporosis, fibromyalgia, anxiety relates 4 weeks of intractable nausea vomiting without pain she had an EGD and abdominal ultrasound which were unremarkable. Per chart pt has lost 10 pounds over the last 4 weeks for nausea and vomiting. Found to have acute renal failure. Intubated 7/8-7/12 and reintubated 7/12-7/16. CXR diffuse bilateral pulmonary infiltrates and small bilateral pleural effusions suggesting mild CHF.        SLP Plan  Continue with current plan of care       Recommendations  Diet recommendations: Dysphagia 2 (fine chop);Nectar-thick liquid Liquids provided Alexandra: Cup;No straw Medication Administration: Crushed with puree Supervision: Patient able to self feed;Full supervision/cueing for compensatory  strategies Compensations: Minimize environmental distractions;Slow rate;Small sips/bites Postural Changes and/or Swallow Maneuvers: Seated upright 90 degrees                Oral Care Recommendations: Oral care BID Follow up Recommendations:  (TBD) SLP Visit Diagnosis: Dysphagia, oropharyngeal phase (R13.12) Plan: Continue with current plan of care       GO                Houston Siren 05/06/2017, 9:35 AM  Orbie Pyo Colvin Caroli.Ed [employer] [phone]

## 2017-05-06 NOTE — Progress Notes (Signed)
Nutrition Follow-up  DOCUMENTATION CODES:   Non-severe (moderate) malnutrition in context of chronic illness  INTERVENTION:   -Magic cup TID with meals, each supplement provides 290 kcal and 9 grams of protein  -Ensure Enlive po TID, each supplement provides 350 kcal and 20 grams of protein   NUTRITION DIAGNOSIS:   Malnutrition (moderate) related to chronic illness (smoker, neuropathy, osteoporosis, CAD) as evidenced by mild depletion of body fat, mild depletion of muscle mass, percent weight loss (12% weight loss within one month).  Being addressed as diet re-advanced, supplements  GOAL:   Patient will meet greater than or equal to 90% of their needs  Progressing  MONITOR:   Diet advancement, PO intake, Labs, I & O's  REASON FOR ASSESSMENT:   Consult Enteral/tube feeding initiation and management  ASSESSMENT:   71 yo female with PMH of fibromyalgia, HLD, neuropathy, osteoporosis, anxiety, carotid stenosis, who was admitted on 7/7 with progressive metabolic acidosis, acute respiratory failure, requiring intubation.  7/21 12 fr NG tube placed by RN, TF ordered 7/22 Pt pulled NG tube out, NPO 7/23 Repeat SLP eval, diet advanced to Dysphagia II, Nectar  Previous recorded po intake 0-25% of meals, no recorded po intake yet today  Mental status has improved, pt more alert and oriented  Labs: reviewed Meds: D5-NS with KCl at 50 ml/hr  Diet Order:  DIET DYS 2 Room service appropriate? Yes; Fluid consistency: Nectar Thick  Skin:  Reviewed, no issues  Last BM:  7/22  Height:   Ht Readings from Last 1 Encounters:  05/02/17 5\' 2"  (1.575 m)    Weight:   Wt Readings from Last 1 Encounters:  05/06/17 102 lb 3.2 oz (46.4 kg)    Ideal Body Weight:  43.9 kg  BMI:  Body mass index is 18.69 kg/m.  Estimated Nutritional Needs:   Kcal:  1400-1600 kcals  Protein:  65-75 gm  Fluid:  1.3-1.5 L  EDUCATION NEEDS:   No education needs identified at this  time  [place], [place], LDN [phone] Pager  [phone] Weekend/On-Call Pager

## 2017-05-06 NOTE — Progress Notes (Signed)
Alexandra TEAM 1 - Stepdown/ICU TEAM  Alexandra House  WPV:[id number] DOB: [birth year] DOA: 04/20/2017 PCP: Marton Redwood, MD    Brief Narrative:  [name] w/ a Hx of Depression, Anxiety, Fibromyalgia, Tobacco abuse with COPD, Osteoporosis, Dyslipidemia, Chronic Systolic and Diastolic CHF, Bilateral Carotid stenosis, and Syncope who presented w/ 3-4 weeks of intractable nausea and vomiting without abdominal pain with an inability to keep anything down. She had been to the ER on several occasions with no definitive etiology found and each time briefly improved after the administration of IV fluids. Outpatient US of the abdomen and EGD were unrevealing.   On the day of her admission she was noted to be hypothermic, tachypneic, and bradycardic with pulses as low as 32 bpm.  Initial blood pressure was 79/39 with an MAP of 48.  She had a creatinine of 3.09. Lactic acid was 6.39. White count was 27,700 with left shift, platelets were 709,000. Troponin elevated at 2.05.   Subjective: The patient remains very much alert and interactive today.  She is even joking with me.  Fortunately she did much better on her speech evaluation this morning and her diet has been advanced.  She has thus far tolerated this very well and is thrilled to be allowed to eat.  She denies chest pain shortness breath fevers chills nausea or vomiting.  Assessment & Plan:  Acute encephalopathy / acute delirium  No CVA on CT head - B-12 and folate not deficient - PCO2 normal - appears to be slowly improving - suspect presently she is suffering w/ ICU delirium  - avoid sedatives as able - making great headway   Intractable nausea and vomiting with weight loss Denies present nausea or vomiting - follow as diet advanced or consistent tube feeding begun - may require gastric emptying scan but at present has not suffered a recurrence of her sx   Dysphagia Cleared for modified diet today but she has thus far tolerating quite well  Acute  hypoxic respiratory failure - severe emphysema - aspiration pneumonitis B LL Has completed a course of empiric antibiotic - appears stable from respiratory standpoint at present - weaned to RA   Chronic systolic and diastolic congestive heart failure EF 45% - peripheral edema improved after two doses of diuretic - no volume overload evident today - follow exam Filed Weights   05/04/17 0700 05/05/17 0500 05/06/17 0501  Weight: 48.9 kg (107 lb 12.8 oz) 47.6 kg (104 lb 14.4 oz) 46.4 kg (102 lb 3.2 oz)    Sinus bradycardia Appears to have resolved - Cards has signed off - following on tele   Demand ischemia Trop peak at 4.39 - for outpt f/u   Anemia Hemoglobin stable at present w/ no evidence of acute blood loss  Acute renal failure Resolved with volume resuscitation  Hypokalemia Corrected with replacement  Hypomagnesemia Continue to supplement to goal of 2.0  Hypophosphatemia  Replaced via IV   Hypocalcemia cont to supplement and follow  Moderate malnutrition in context of chronic illness   DVT prophylaxis: SQ heparin  Code Status: FULL CODE Family Communication: Spoke with brother at bedside at length  Disposition Plan: tele - PT/OT - suspect SNF will be required   Consultants:  PCCM Cardiology  Procedures: 7/07 admit with N/V, weakness, hypotension  7/08 intubated 7/12 extubated 7/13 re-intubated 7/16 extubated  7/17 new onset acute confusion  Antimicrobials:  House presently  Objective: Blood pressure 129/71, pulse 88, temperature 97.7 F (36.5 C), temperature source Oral,  resp. rate 16, height 5\' 2"  (1.575 m), weight 46.4 kg (102 lb 3.2 oz), SpO2 97 %.  Intake/Output Summary (Last 24 hours) at 05/06/17 1202 Last data filed at 05/06/17 0502  Gross per 24 hour  Intake           540.83 ml  Output             3200 ml  Net         -2659.17 ml   Filed Weights   05/04/17 0700 05/05/17 0500 05/06/17 0501  Weight: 48.9 kg (107 lb 12.8 oz) 47.6 kg (104 lb  14.4 oz) 46.4 kg (102 lb 3.2 oz)    Examination: General: No acute respiratory distress  Lungs: Clear to auscultation B Cardiovascular: RRR Abdomen: NT/ND, soft, bowel sounds positive, no rebound Extremities: no LE edema  CBC:  Recent Labs Lab 05/01/17 0332 05/02/17 0550 05/03/17 0327 05/04/17 0327 05/05/17 0217  WBC 8.7 10.4 9.4 11.1* 12.6*  HGB 9.2* 9.4* 9.3* 9.4* 9.9*  HCT 28.2* 30.0* 30.5* 30.6* 31.3*  MCV 89.0 90.4 91.0 89.7 88.9  PLT 336 295 278 241 466   Basic Metabolic Panel:  Recent Labs Lab 04/30/17 0358  05/02/17 0550 05/02/17 2044 05/03/17 0327 05/04/17 0327 05/05/17 0217  NA 135  < > 137 139 136 138 136  K 2.9*  < > 3.1* 4.0 4.1 3.6 4.1  CL 91*  < > 99* 100* 101 105 102  CO2 28  < > 30 29 28 26 24   GLUCOSE 111*  < > 76 86 101* 87 97  BUN 27*  < > 19 14 14 10 9   CREATININE 1.37*  < > 0.90 0.88 0.83 0.62 0.64  CALCIUM 5.8*  < > 6.6* 6.8* 6.4* 6.3* 6.6*  MG 1.8  --   --   --  1.6* 1.7 2.0  PHOS  --   --   --   --   --  1.8* 3.9  < > = values in this interval not displayed. GFR: Estimated Creatinine Clearance: 45.9 mL/min (by C-G formula based on SCr of 0.64 mg/dL).  Liver Function Tests:  Recent Labs Lab 05/02/17 1849 05/02/17 2044 05/04/17 0327 05/05/17 0217  AST  --  46* 41 54*  ALT  --  61* 45 48  ALKPHOS  --  143* 110 124  BILITOT  --  1.0 0.9 0.9  PROT  --  4.9* 4.4* 5.0*  ALBUMIN 2.1* 2.0* 1.9* 2.1*    CBG:  Recent Labs Lab 05/05/17 2027 05/06/17 0027 05/06/17 0439 05/06/17 0731 05/06/17 1118  GLUCAP 95 88 93 88 101*    Recent Results (from the past 240 hour(s))  C difficile quick scan w PCR reflex     Status: Abnormal   Collection Time: 04/27/17 11:06 AM  Result Value Ref Range Status   C Diff antigen POSITIVE (A) NEGATIVE Final   C Diff toxin NEGATIVE NEGATIVE Final   C Diff interpretation Results are indeterminate. See PCR results.  Final    Comment: PERFORMED ON FORMED STOOL PER DR. MANNAM REQUEST     Scheduled  Meds: . arformoterol  15 mcg Nebulization BID  . aspirin  325 mg Per Tube Daily  . budesonide (PULMICORT) nebulizer solution  0.5 mg Nebulization BID  . calcium carbonate  1 tablet Per Tube TID  . chlorhexidine  15 mL Mouth Rinse BID  . Chlorhexidine Gluconate Cloth  6 each Topical Daily  . clotrimazole  10 mg Oral 5 X  Daily  . feeding supplement (ENSURE ENLIVE)  237 mL Oral TID BM  . heparin subcutaneous  5,000 Units Subcutaneous Q8H  . ipratropium-albuterol  3 mL Nebulization BID  . mouth rinse  15 mL Mouth Rinse q12n4p  . pantoprazole sodium  40 mg Per Tube Daily  . sennosides  5 mL Per Tube BID  . sodium chloride flush  10-40 mL Intracatheter Q12H     LOS: 16 days   Cherene Altes, MD [facility] Office  [phone] Pager - Text Page per Amion as per below:  On-Call/Text Page:      [email or web address]      password [email or web address]  If 7PM-7AM, please contact night-coverage [email or web address] Password [facility] 05/06/2017, 12:02 PM

## 2017-05-07 LAB — GLUCOSE, CAPILLARY
Glucose-Capillary: 111 mg/dL — ABNORMAL HIGH (ref 65–99)
Glucose-Capillary: 85 mg/dL (ref 65–99)
Glucose-Capillary: 88 mg/dL (ref 65–99)

## 2017-05-07 LAB — COMPREHENSIVE METABOLIC PANEL
ALT: 54 U/L (ref 14–54)
AST: 50 U/L — AB (ref 15–41)
Albumin: 2 g/dL — ABNORMAL LOW (ref 3.5–5.0)
Alkaline Phosphatase: 125 U/L (ref 38–126)
Anion gap: 7 (ref 5–15)
BILIRUBIN TOTAL: 0.8 mg/dL (ref 0.3–1.2)
BUN: 5 mg/dL — AB (ref 6–20)
CHLORIDE: 112 mmol/L — AB (ref 101–111)
CO2: 21 mmol/L — ABNORMAL LOW (ref 22–32)
CREATININE: 0.66 mg/dL (ref 0.44–1.00)
Calcium: 7.4 mg/dL — ABNORMAL LOW (ref 8.9–10.3)
GFR calc Af Amer: >60 mL/min (ref >60)
GLUCOSE: 78 mg/dL (ref 65–99)
Potassium: 3.9 mmol/L (ref 3.5–5.1)
Sodium: 140 mmol/L (ref 135–145)
Total Protein: 4.7 g/dL — ABNORMAL LOW (ref 6.5–8.1)

## 2017-05-07 LAB — CBC
HEMATOCRIT: 28.4 % — AB (ref 36.0–46.0)
Hemoglobin: 9 g/dL — ABNORMAL LOW (ref 12.0–15.0)
MCH: 27.9 pg (ref 26.0–34.0)
MCHC: 31.7 g/dL (ref 30.0–36.0)
MCV: 87.9 fL (ref 78.0–100.0)
Platelets: 171 10*3/uL (ref 150–400)
RBC: 3.23 MIL/uL — ABNORMAL LOW (ref 3.87–5.11)
RDW: 21.8 % — AB (ref 11.5–15.5)
WBC: 9.4 10*3/uL (ref 4.0–10.5)

## 2017-05-07 LAB — MAGNESIUM: MAGNESIUM: 1.6 mg/dL — AB (ref 1.7–2.4)

## 2017-05-07 MED ORDER — IPRATROPIUM-ALBUTEROL 0.5-2.5 (3) MG/3ML IN SOLN: 3.0000 mL | RESPIRATORY_TRACT | Status: DC | PRN

## 2017-05-07 MED ORDER — MAGNESIUM SULFATE 2 GM/50ML IV SOLN
2.0000 g | Freq: Once | INTRAVENOUS | Status: AC
  Administered 2017-05-07: 2 g via INTRAVENOUS
  Filled 2017-05-07: qty 50

## 2017-05-07 NOTE — Progress Notes (Signed)
  Speech Language Pathology Treatment: Dysphagia  Patient Details Name: Alexandra House MRN: [id number] DOB: [birth year] Today's Date: 05/07/2017 Time: 1607-3710 SLP Time Calculation (min) (ACUTE ONLY): 12 min  Assessment / Plan / Recommendation Clinical Impression  Eating breakfast when SLP arrived. Decreased bolus manipulation with reduced mastication of french toast ("nibbling"), expectorating the grit particles. No indications of airway compromise during observation. Reviewed ST treatment and plan with son. Plan is to trial thin liquids and upgraded texture tomorrow.    HPI HPI: Alexandra House an 71 y.o.femalepast medical history of COPD, osteoporosis, fibromyalgia, anxiety relates 4 weeks of intractable nausea vomiting without pain she had an EGD and abdominal ultrasound which were unremarkable. Per chart pt has lost 10 pounds over the last 4 weeks for nausea and vomiting. Found to have acute renal failure. Intubated 7/8-7/12 and reintubated 7/12-7/16. CXR diffuse bilateral pulmonary infiltrates and small bilateral pleural effusions suggesting mild CHF.        SLP Plan  Continue with current plan of care       Recommendations  Diet recommendations: Dysphagia 2 (fine chop);Nectar-thick liquid Liquids provided via: Cup;No straw Medication Administration: Crushed with puree Supervision: Patient able to self feed;Full supervision/cueing for compensatory strategies Compensations: Minimize environmental distractions;Slow rate;Small sips/bites Postural Changes and/or Swallow Maneuvers: Seated upright 90 degrees                Oral Care Recommendations: Oral care BID Follow up Recommendations:  (TBD) SLP Visit Diagnosis: Dysphagia, oropharyngeal phase (R13.12) Plan: Continue with current plan of care       GO                Alexandra House 05/07/2017, 8:51 AM   Alexandra House.Ed [employer] [phone]

## 2017-05-07 NOTE — Care Management Important Message (Signed)
Important Message  Patient Details  Name: Alexandra House MRN: [id number] Date of Birth: [birth year]   Medicare Important Message Given:  Yes    Karey Stucki Montine Circle 05/07/2017, 11:48 AM

## 2017-05-07 NOTE — Progress Notes (Signed)
Occupational Therapy Treatment Patient Details Name: Alexandra House MRN: [id number] DOB: [birth year] Today's Date: 05/07/2017    History of present illness Pt adm with septic shock likely due to aspiration PNA. Pt intubated 7/8 and extubated 7/12. Reintubated 7/13 and extubated 7/16. Pt with bradycardia requiring external pacing on admission. PMH - chf, copd, osteoporosis, depression, syncope   OT comments  Pt ambulated to sink and sat on [name] for bathing and dressing. Improved activity tolerance, cognition and mobility. Pt pleased with her progress. Will continue to follow.  Follow Up Recommendations  SNF    Equipment Recommendations  3 in 1 bedside commode    Recommendations for Other Services      Precautions / Restrictions Precautions Precautions: Fall Restrictions Weight Bearing Restrictions: No       Mobility Bed Mobility Overal bed mobility: Needs Assistance Bed Mobility: Supine to Sit;Sit to Supine     Supine to sit: Supervision;HOB elevated Sit to supine: Min assist   General bed mobility comments: cues for sequencing, use of rail, increased time to EOB, assist for LEs back into bed  Transfers Overall transfer level: Needs assistance Equipment used: Rolling walker (2 wheeled) Transfers: Sit to/from Stand Sit to Stand: Min assist         General transfer comment: cues for hand placement, assist to stand and steady    Balance Overall balance assessment: Needs assistance   Sitting balance-Leahy Scale: Fair       Standing balance-Leahy Scale: Poor                             ADL either performed or assessed with clinical judgement   ADL Overall ADL's : Needs assistance/impaired Eating/Feeding: Set up;Sitting   Grooming: Wash/dry hands;Wash/dry face;Sitting;Supervision/safety;Brushing hair;Maximal assistance   Upper Body Bathing: Sitting;Moderate assistance Upper Body Bathing Details (indicate cue type and reason): assisted with  back Lower Body Bathing: Total assistance;Sit to/from stand   Upper Body Dressing : Minimal assistance;Sitting   Lower Body Dressing: Total assistance;Sit to/from stand   Toilet Transfer: Minimal assistance;Ambulation;BSC;RW   Toileting- Clothing Manipulation and Hygiene: Total assistance;Sit to/from stand Toileting - Clothing Manipulation Details (indicate cue type and reason): Pt incontinent of stool.  Assisted with peri care      Functional mobility during ADLs: Minimal assistance;Cueing for sequencing;Rolling walker;Cueing for safety General ADL Comments: Pt very pleased with her progress and ability to sit at the sink for bathing and dressing.     Vision       Perception     Praxis      Cognition Arousal/Alertness: Awake/alert Behavior During Therapy: WFL for tasks assessed/performed Overall Cognitive Status: Impaired/Different from baseline Area of Impairment: Problem solving                             Problem Solving: Slow processing;Decreased initiation;Difficulty sequencing;Requires verbal cues          Exercises     Shoulder Instructions       General Comments      Pertinent Vitals/ Pain       Pain Assessment: No/denies pain  Home Living                                          Prior Functioning/Environment  Frequency  Min 2X/week        Progress Toward Goals  OT Goals(current goals can now be found in the care plan section)  Progress towards OT goals: Progressing toward goals  Acute Rehab OT Goals Patient Stated Goal: to get stronger OT Goal Formulation: With patient Time For Goal Achievement: 05/16/17 Potential to Achieve Goals: Good  Plan Discharge plan remains appropriate    Co-evaluation                 AM-PAC PT "6 Clicks" Daily Activity     Outcome Measure   Help from another person eating meals?: A Little Help from another person taking care of personal grooming?: A  Lot Help from another person toileting, which includes using toliet, bedpan, or urinal?: Total Help from another person bathing (including washing, rinsing, drying)?: A Lot Help from another person to put on and taking off regular upper body clothing?: A Little Help from another person to put on and taking off regular lower body clothing?: Total 6 Click Score: 12    End of Session Equipment Utilized During Treatment: Gait belt;Rolling walker  OT Visit Diagnosis: Unsteadiness on feet (R26.81);Muscle weakness (generalized) (M62.81)   Activity Tolerance Patient tolerated treatment well   Patient Left in bed;with call bell/phone within reach;with nursing/sitter in room   Nurse Communication          Time: 2224-1146 OT Time Calculation (min): 48 min  Charges: OT General Charges $OT Visit: 1 Procedure OT Treatments $Self Care/Home Management : 38-52 mins     Malka So 05/07/2017, 10:49 AM  [phone]

## 2017-05-07 NOTE — Clinical Social Work Note (Signed)
CSW continues to follow for discharge needs.  Alayiah Fontes, CSW [phone]  

## 2017-05-07 NOTE — Progress Notes (Signed)
Patient stable 7p-7a. No complaints or concerns from patient or family. Patient uses oral suctioning as appropriate.

## 2017-05-07 NOTE — Progress Notes (Signed)
PROGRESS NOTE    Alexandra House  ZTI:[id number] DOB: Aug 29, 1946 DOA: 04/20/2017 PCP: Marton Redwood, MD   Brief Narrative:  71 y.o. WF PMHx Depression,Anxiety, FibromyalgiaTobacco abuse with COPD, Osteoporosis, Dyslipidemia, Chronic Systolic and Diastolic CHF, Bilateral Carotid stenosis, Syncope,   For the last 4 weeks patient has developed intractable nausea and vomiting without abdominal pain. She has been unable to keep down anything that she has eaten or drunk. She is not having dysphagia. She is unable to keep medications down. She has been to the ER on several occasions with no definitive etiology found and has briefly improved after the administration of IV fluids. She has undergone outpatient ultrasound of the abdomen and EGD which are also unrevealing. She is not had any fevers or chills. She's had a weight loss of at least 10 pounds over 4 weeks. She's not had any blood in her emesis. Since developing the symptoms she has lost the urge to smoke. Husband reports that for 6 months prior to the symptoms patient's activity tolerance has markedly decreased and she primarily complained of dyspnea on exertion noting she was no longer able to do activities she likes such as going shopping.   Today she presents to the ER with symptoms as above. She was noted to be hypothermic, tachypneic, bradycardic with pulses as low as 32 bpm, initial blood pressure was 79/39 with an MAP of 48. She was not hypoxemic. She had an acute kidney injury with a creatinine of 3.09. Lactic acid was elevated at 6.39. White count was 27,700 with left shift, platelets were also elevated at 709,000. Troponin elevated at 2.05. Clinically she appears significantly dehydrated. She had poor capillary refill and her color was poor noting she was quite pale in appearance. She has received 1200 mL normal saline bolus in the ER her blood pressure has rebounded to 107/35 and she is now receiving an additional 500 mL saline bolus followed by  IV fluids at 150 mL/hr. and warming blanket history is benign applied. PCCM was consulted and felt the patient was appropriate to be admitted to stepdown unit. IV Levophed is available at the bedside in the event patient's blood pressure decreases again after warming. Discussed extensively with husband and patient is a DO NOT RESUSCITATE although patient/has been agreeable with short-term pressors and utilization of BiPAP if necessary. Patient is awake and oriented. Because of bradycardia and abnormal troponin and cardiology has been consulted by the EDP.   Subjective: 7/24 A/O 4, sitting in chair comfortably eating her dinner. Negative CP, negative SOB, negative abdominal pain, negative N/V. States ambulated around room able to take her own sponge bath.       Assessment & Plan:   Principal Problem:   Hypotension Active Problems:   Severe dehydration   Intractable nausea and vomiting   AKI (acute kidney injury) ([place])   Sinus bradycardia   Hypothermia   HLD (hyperlipidemia)   COPD (chronic obstructive pulmonary disease) (HCC)   Tobacco abuse   Elevated lactic acid level   Elevated troponin   Carotid stenosis, bilateral   Severe sepsis with septic shock (HCC)   NSTEMI (non-ST elevated myocardial infarction) ([place])   Acute respiratory failure with hypoxemia (HCC)   Aspiration pneumonia of both lungs due to vomit ([place])   Septic shock (HCC)   Hypokalemia   Hypocalcemia   Encounter for intubation   Acute encephalopathy   Acute encephalopathy -Resolved   Acute Respiratory Failure with hypoxia/Centrilobular Emphysema/Aspiration PNA -Completed course of antibiotics -Pulmicort BID -  DuoNeb PRN -Titrate O2 to maintain SPO2 89-93%   Tobacco abuse -Per sister or continues to smoke whenever she has a chance.  Chronic Systolic and Diastolic CHF -Echocardiogram 2 consistent with CHF see results below -Strict I&O since admission -5.7 L -Daily weight Filed Weights   05/05/17 0500  05/06/17 0501 05/07/17 0506  Weight: 104 lb 14.4 oz (47.6 kg) 102 lb 3.2 oz (46.4 kg) 95 lb 1.6 oz (43.1 kg)  -Cont ASA -Cardiology following; recommending further workup after complete improvement; possibly after d/c -Transfuse for hemoglobin<8  Bradycardia, with junctional escape rhythm, stable -resolved  Pulmonary hypertension -See CHF  Elevated troponin -Suspect demand ischemia  Anemia -Anemia panel pending  Acute renal failure -Resolved  Nausea / Vomiting,   -Resolved   Hypokalemia -Potassium goal> 4  Hypomagnesmia --Magnesium goal>2 --Magnesium IV 2 gm  Hypocalcemia:  -Corrected calcium= 9.2 -TSH 4.8, borderline for subacute hypothyroidism unfortunately during acute illness T3/T4 of no use    DVT prophylaxis: Subcutaneous heparin Code Status: Full Family Communication: Brother at bedside Disposition Plan: Discharge on 7/25 to SNF   Consultants:  [facility] M    Procedures/Significant Events:  7/05 [place] ABD >>abdominal US on 7/5 which was negative for gallstones, hepatic abnormality, normal pancreas &no hydronephrosis.  7/07 Admit with N/V, weakness, hypotension   7/7 CT chest/abdomen/pelvis> emphysema, mult-lobar pneumonia, fatty liver, fat stranding around head of pancreas 7/8 Echocardiogram: LVEF=45%.-Hypokinesis of the basal-mid anteroseptal, basal-mid inferoseptal, basal-mid inferior, and   apical septal myocardium. - Mitral valve: moderate regurgitation. 7/8 >> intubated 7/12>> extubated 7/13 CT chest/abdomen/pelvis> Moderate B/L effusions, patchy air space disease at bases. Anasarca 7/13>> intubated 7/14 Echocardiogram: LVEF= 45% to 50%. There is akinesis of the basal-midinferoseptal  myocardium. There is akinesis of the basal-midinferior myocardium.  - Mitral valve: moderate regurgitation  - Pulmonary arteries: PA peak pressure: 38 mm Hg (S). 7/16 CXR>Diffuse bilateral pulmonary infiltrates and small bilateral pleural effusions suggesting mild  CHF. 7/16>> extubated 7/17 CT head wo contrast: No acute intracranial hemorrhage, definite new infarction, mass lesion, midline shift, herniation, hydrocephalus,  7/17>> new onset acute confusion    VENTILATOR SETTINGS: None   Cultures BCx2 7/7>> final negative UC 7/7 >>final negative BCX 7/10>>NGTD x2 days resp culture 7/10 >> few candida albicans BCX 7/13>> negative Ucx 7/13>> negative Sputum cx>> negative      Antimicrobials: Anti-infectives    Start     Stop   04/28/17 1800  vancomycin (VANCOCIN) 50 mg/mL oral solution 500 mg  Status:  Discontinued     04/30/17 1037   04/26/17 1200  meropenem (MERREM) 1 g in sodium chloride 0.9 % 100 mL IVPB  Status:  Discontinued     04/30/17 1039   04/26/17 1200  linezolid (ZYVOX) IVPB 600 mg  Status:  Discontinued     04/30/17 1039   04/26/17 0700  vancomycin (VANCOCIN) 50 mg/mL oral solution 500 mg  Status:  Discontinued     04/28/17 1634   04/24/17 1400  piperacillin-tazobactam (ZOSYN) IVPB 3.375 g  Status:  Discontinued     04/26/17 1150   04/22/17 0445  vancomycin (VANCOCIN) 500 mg in sodium chloride 0.9 % 100 mL IVPB  Status:  Discontinued     04/24/17 0918   04/20/17 2000  piperacillin-tazobactam (ZOSYN) IVPB 2.25 g  Status:  Discontinued     04/24/17 0807   04/20/17 1130  piperacillin-tazobactam (ZOSYN) IVPB 3.375 g     04/20/17 1346   04/20/17 1130  vancomycin (VANCOCIN) IVPB 1000 mg/200 mL premix  04/20/17 1347       Devices   LINES / TUBES:  ETT 7/8 >>7/12 ETT 7/13>>7/16 CVC LIJ 7/8 >> Urethral cath 7/8>> NGT 7/8>>    Continuous Infusions: . dextrose 5 % and 0.9 % NaCl with KCl 20 mEq/L 20 mL/hr at 05/06/17 2138     Objective: Vitals:   05/06/17 1231 05/06/17 1952 05/06/17 1959 05/07/17 0506  BP: (!) 105/48  (!) 111/55 130/63  Pulse: 83  87 78  Resp: 18  18 18   Temp: 98.2 F (36.8 C)  98.2 F (36.8 C) 98.1 F (36.7 C)  TempSrc: Oral  Oral Oral  SpO2: 98% 99% 99% 97%  Weight:    95  lb 1.6 oz (43.1 kg)  Height:        Intake/Output Summary (Last 24 hours) at 05/07/17 0753 Last data filed at 05/07/17 0600  Gross per 24 hour  Intake             1462 ml  Output                0 ml  Net             1462 ml   Filed Weights   05/05/17 0500 05/06/17 0501 05/07/17 0506  Weight: 104 lb 14.4 oz (47.6 kg) 102 lb 3.2 oz (46.4 kg) 95 lb 1.6 oz (43.1 kg)    Examination:  General: A/O 4,, negative acute respiratory distress, cachectic Eyes: negative scleral hemorrhage, negative anisocoria, negative icterus ENT: Negative Runny nose, negative gingival bleeding, Neck:  Negative scars, masses, torticollis, lymphadenopathy, JVD Lungs: diffuse breath sounds with bibasilar rhonchi, negative wheezes or crackles Cardiovascular: Regular rate and rhythm without murmur gallop or rub normal S1 and S2 Abdomen: negative abdominal pain, nondistended, positive soft, bowel sounds, no rebound, no ascites, no appreciable mass Extremities: No significant cyanosis, clubbing, or edema bilateral lower extremities Skin: Negative rashes, lesions, ulcers Psychiatric:  Negative depression, negative anxiety, negative fatigue, negative mania  Central nervous system:  Cranial nerves II through XII intact, tongue/uvula midline, all extremities muscle strength 5/5, sensation intact throughout, negative dysarthria, negative expressive aphasia, negative receptive aphasia.   .     Data Reviewed: Care during the described time interval was provided by me .  I have reviewed this patient's available data, including medical history, events of note, physical examination, and all test results as part of my evaluation. I have personally reviewed and interpreted all radiology studies.  CBC:  Recent Labs Lab 05/02/17 0550 05/03/17 0327 05/04/17 0327 05/05/17 0217 05/07/17 0337  WBC 10.4 9.4 11.1* 12.6* 9.4  HGB 9.4* 9.3* 9.4* 9.9* 9.0*  HCT 30.0* 30.5* 30.6* 31.3* 28.4*  MCV 90.4 91.0 89.7 88.9 87.9    PLT 295 278 241 205 782   Basic Metabolic Panel:  Recent Labs Lab 05/02/17 2044 05/03/17 0327 05/04/17 0327 05/05/17 0217 05/07/17 0337  NA 139 136 138 136 140  K 4.0 4.1 3.6 4.1 3.9  CL 100* 101 105 102 112*  CO2 29 28 26 24  21*  GLUCOSE 86 101* 87 97 78  BUN 14 14 10 9  5*  CREATININE 0.88 0.83 0.62 0.64 0.66  CALCIUM 6.8* 6.4* 6.3* 6.6* 7.4*  MG  --  1.6* 1.7 2.0 1.6*  PHOS  --   --  1.8* 3.9  --    GFR: Estimated Creatinine Clearance: 42.6 mL/min (by C-G formula based on SCr of 0.66 mg/dL). Liver Function Tests:  Recent Labs Lab 05/02/17 1849 05/02/17 2044  05/04/17 0327 05/05/17 0217 05/07/17 0337  AST  --  46* 41 54* 50*  ALT  --  61* 45 48 54  ALKPHOS  --  143* 110 124 125  BILITOT  --  1.0 0.9 0.9 0.8  PROT  --  4.9* 4.4* 5.0* 4.7*  ALBUMIN 2.1* 2.0* 1.9* 2.1* 2.0*   No results for input(s): LIPASE, AMYLASE in the last 168 hours. No results for input(s): AMMONIA in the last 168 hours. Coagulation Profile: No results for input(s): INR, PROTIME in the last 168 hours. Cardiac Enzymes: No results for input(s): CKTOTAL, CKMB, CKMBINDEX, TROPONINI in the last 168 hours. BNP (last 3 results) No results for input(s): PROBNP in the last 8760 hours. HbA1C: No results for input(s): HGBA1C in the last 72 hours. CBG:  Recent Labs Lab 05/06/17 1118 05/06/17 1632 05/06/17 1955 05/07/17 0035 05/07/17 0718  GLUCAP 101* 81 90 88 85   Lipid Profile: No results for input(s): CHOL, HDL, LDLCALC, TRIG, CHOLHDL, LDLDIRECT in the last 72 hours. Thyroid Function Tests: No results for input(s): TSH, T4TOTAL, FREET4, T3FREE, THYROIDAB in the last 72 hours. Anemia Panel: No results for input(s): VITAMINB12, FOLATE, FERRITIN, TIBC, IRON, RETICCTPCT in the last 72 hours. Urine analysis:    Component Value Date/Time   COLORURINE YELLOW 04/26/2017 0830   APPEARANCEUR HAZY (A) 04/26/2017 0830   LABSPEC 1.015 04/26/2017 0830   PHURINE 5.0 04/26/2017 0830   GLUCOSEU  NEGATIVE 04/26/2017 0830   HGBUR NEGATIVE 04/26/2017 0830   BILIRUBINUR NEGATIVE 04/26/2017 0830   KETONESUR NEGATIVE 04/26/2017 0830   PROTEINUR NEGATIVE 04/26/2017 0830   NITRITE NEGATIVE 04/26/2017 0830   LEUKOCYTESUR NEGATIVE 04/26/2017 0830   Sepsis Labs: @LABRCNTIP (procalcitonin:4,lacticidven:4)  ) Recent Results (from the past 240 hour(s))  C difficile quick scan w PCR reflex     Status: Abnormal   Collection Time: 04/27/17 11:06 AM  Result Value Ref Range Status   C Diff antigen POSITIVE (A) NEGATIVE Final   C Diff toxin NEGATIVE NEGATIVE Final   C Diff interpretation Results are indeterminate. See PCR results.  Final    Comment: PERFORMED ON FORMED STOOL PER DR. [facility] REQUEST         Radiology Studies: No results found.      Scheduled Meds: . arformoterol  15 mcg Nebulization BID  . aspirin  325 mg Oral Daily  . budesonide (PULMICORT) nebulizer solution  0.5 mg Nebulization BID  . calcium carbonate  1 tablet Oral TID  . chlorhexidine  15 mL Mouth Rinse BID  . Chlorhexidine Gluconate Cloth  6 each Topical Daily  . clotrimazole  10 mg Oral 5 X Daily  . feeding supplement (ENSURE ENLIVE)  237 mL Oral TID BM  . heparin subcutaneous  5,000 Units Subcutaneous Q8H  . ipratropium-albuterol  3 mL Nebulization BID  . mouth rinse  15 mL Mouth Rinse q12n4p  . pantoprazole  40 mg Oral Q1200  . sennosides  5 mL Oral BID  . sodium chloride flush  10-40 mL Intracatheter Q12H   Continuous Infusions: . dextrose 5 % and 0.9 % NaCl with KCl 20 mEq/L 20 mL/hr at 05/06/17 2138     LOS: 17 days    Time spent: 40 minutes    Saniya Tranchina, Geraldo Docker, MD [facility] Pager [phone]   If 7PM-7AM, please contact night-coverage [email or web address] Password [facility] 05/07/2017, 7:53 AM

## 2017-05-08 LAB — BASIC METABOLIC PANEL
ANION GAP: 5 (ref 5–15)
BUN: 7 mg/dL (ref 6–20)
CHLORIDE: 113 mmol/L — AB (ref 101–111)
CO2: 20 mmol/L — ABNORMAL LOW (ref 22–32)
Calcium: 7.9 mg/dL — ABNORMAL LOW (ref 8.9–10.3)
Creatinine, Ser: 0.71 mg/dL (ref 0.44–1.00)
GFR calc Af Amer: >60 mL/min (ref >60)
GLUCOSE: 89 mg/dL (ref 65–99)
POTASSIUM: 4.1 mmol/L (ref 3.5–5.1)
Sodium: 138 mmol/L (ref 135–145)

## 2017-05-08 LAB — MAGNESIUM: MAGNESIUM: 2 mg/dL (ref 1.7–2.4)

## 2017-05-08 NOTE — Progress Notes (Signed)
Physical Therapy Treatment Patient Details Name: Alexandra House MRN: [id number] DOB: 1946/05/20 Today's Date: 05/08/2017    History of Present Illness Pt adm with septic shock likely due to aspiration PNA. Pt intubated 7/8 and extubated 7/12. Reintubated 7/13 and extubated 7/16. Pt with bradycardia requiring external pacing on admission. PMH - chf, copd, osteoporosis, depression, syncope    PT Comments    Pt making excellent progress towards achieving her current functional mobility goals. Pt tolerated ambulating further distance and multiple bouts with SPO2 maintaining in the mid to high 90's on RA. Pt would continue to benefit from skilled physical therapy services at this time while admitted and after d/c to address the below listed limitations in order to improve overall safety and independence with functional mobility.   Follow Up Recommendations  SNF     Equipment Recommendations  Rolling walker with 5" wheels    Recommendations for Other Services       Precautions / Restrictions Precautions Precautions: Fall Restrictions Weight Bearing Restrictions: No    Mobility  Bed Mobility               General bed mobility comments: pt sitting EOB upon arrival  Transfers Overall transfer level: Needs assistance Equipment used: Rolling walker (2 wheeled) Transfers: Sit to/from Stand Sit to Stand: Min assist         General transfer comment: cues for hand placement, assist to stand and steady  Ambulation/Gait Ambulation/Gait assistance: Min guard Ambulation Distance (Feet): 40 Feet (40' x1, 40' x1, 50' x1 (sitting rest breaks in between)) Assistive device: Rolling walker (2 wheeled) Gait Pattern/deviations: Step-through pattern;Decreased stride length;Drifts right/left;Trunk flexed Gait velocity: decreased Gait velocity interpretation: Below normal speed for age/gender General Gait Details: pt with mild instability but no overt LOB or need for physical assistance,  min guard for safety. pt ambulated on RA with SPO2 maintaining in mid to high 90's. pt fatiguing quickly and requiring multiple sitting rest breaks.   Stairs            Wheelchair Mobility    Modified Rankin (Stroke Patients Only)       Balance Overall balance assessment: Needs assistance Sitting-balance support: No upper extremity supported;Feet supported Sitting balance-Leahy Scale: Fair     Standing balance support: Bilateral upper extremity supported Standing balance-Leahy Scale: Poor Standing balance comment: pt reliant on bilateral UE supports on RW and min guard                            Cognition Arousal/Alertness: Awake/alert Behavior During Therapy: WFL for tasks assessed/performed Overall Cognitive Status: Within Functional Limits for tasks assessed                                        Exercises      General Comments        Pertinent Vitals/Pain Pain Assessment: No/denies pain    Home Living                      Prior Function            PT Goals (current goals can now be found in the care plan section) Acute Rehab PT Goals PT Goal Formulation: With patient Time For Goal Achievement: 05/14/17 Potential to Achieve Goals: Good Progress towards PT goals: Progressing toward goals    Frequency  Min 3X/week      PT Plan Current plan remains appropriate    Co-evaluation              AM-PAC PT "6 Clicks" Daily Activity  Outcome Measure  Difficulty turning over in bed (including adjusting bedclothes, sheets and blankets)?: A Little Difficulty moving from lying on back to sitting on the side of the bed? : A Little Difficulty sitting down on and standing up from a chair with arms (e.g., wheelchair, bedside commode, etc,.)?: Total Help needed moving to and from a bed to chair (including a wheelchair)?: A Little Help needed walking in hospital room?: A Little Help needed climbing 3-5 steps with  a railing? : A Little 6 Click Score: 16    End of Session Equipment Utilized During Treatment: Gait belt Activity Tolerance: Patient tolerated treatment well Patient left: in chair;with call bell/phone within reach;with chair alarm set;with family/visitor present Nurse Communication: Mobility status PT Visit Diagnosis: Unsteadiness on feet (R26.81);Muscle weakness (generalized) (M62.81);Difficulty in walking, not elsewhere classified (R26.2)     Time: 8264-1583 PT Time Calculation (min) (ACUTE ONLY): 17 min  Charges:  $Gait Training: 8-22 mins                    G Codes:       [place], [place], [place] 05/08/2017, 11:02 AM

## 2017-05-08 NOTE — NC FL2 (Signed)
[place] LEVEL OF CARE SCREENING TOOL     IDENTIFICATION  Patient Name: Alexandra House Birthdate: 1946-01-06 Sex: female Admission Date (Current Location): 04/20/2017  [facility] and [place] Number:  Herbalist and Address:  [facility] [place]. [facility], [place] [address], [place], [place] [address]      Provider Number: [id number]  Attending Physician Name and Address:  Cherene Altes, MD  Relative Name and Phone Number:       Current Level of Care: Hospital Recommended Level of Care: [place] Prior Approval Number:    Date Approved/Denied:   PASRR Number: [id number]  Discharge Plan: SNF    Current Diagnoses: Patient Active Problem List   Diagnosis Date Noted  . Acute encephalopathy   . Encounter for intubation   . Hypokalemia 04/22/2017  . Hypocalcemia 04/22/2017  . Acute respiratory failure with hypoxemia ([place])   . Aspiration pneumonia of both lungs due to vomit ([place])   . Septic shock ([place])   . Hypotension 04/20/2017  . Severe dehydration 04/20/2017  . Intractable nausea and vomiting 04/20/2017  . AKI (acute kidney injury) ([place]) 04/20/2017  . Sinus bradycardia 04/20/2017  . Hypothermia 04/20/2017  . HLD (hyperlipidemia) 04/20/2017  . COPD (chronic obstructive pulmonary disease) ([place]) 04/20/2017  . Tobacco abuse 04/20/2017  . Elevated lactic acid level 04/20/2017  . Elevated troponin 04/20/2017  . Carotid stenosis, bilateral 04/20/2017  . Severe sepsis with septic shock ([place]) 04/20/2017  . NSTEMI (non-ST elevated myocardial infarction) ([place])   . Cerebrovascular disease 04/06/2013  . Noninfectious gastroenteritis and colitis 04/01/2013  . Bruit 01/06/2013  . Nonspecific abnormal unspecified cardiovascular function study 01/06/2013  . Chest pain 08/28/2012  . CARDIAC MURMUR 12/05/2009  . PURE HYPERCHOLESTEROLEMIA 09/12/2009  . TOBACCO ABUSE 09/12/2009  . FIBROMYALGIA 09/12/2009  . SYNCOPE 09/12/2009     Orientation RESPIRATION BLADDER Height & Weight     Self, Time, Situation, Place  Normal Continent Weight: 93 lb 3.2 oz (42.3 kg) Height:  5\' 2"  (157.5 cm)  BEHAVIORAL SYMPTOMS/MOOD NEUROLOGICAL BOWEL NUTRITION STATUS   (None)  (None) Continent Diet (DYS 2, Fluid nectar thick.)  AMBULATORY STATUS COMMUNICATION OF NEEDS Skin   Limited Assist Verbally Bruising, Other (Comment) (Skin tear, weeping.)                       Personal Care Assistance Level of Assistance  Bathing, Feeding, Dressing Bathing Assistance: Maximum assistance Feeding assistance: Limited assistance Dressing Assistance: Maximum assistance     Functional Limitations Info  Sight, Hearing, Speech Sight Info: Adequate Hearing Info: Adequate Speech Info: Adequate    SPECIAL CARE FACTORS FREQUENCY  PT (By licensed PT), OT (By licensed OT), Speech therapy, Blood pressure     PT Frequency: 5 x week OT Frequency: 5 x week     Speech Therapy Frequency: 5 x week      Contractures Contractures Info: Not present    Additional Factors Info  Code Status, Allergies Code Status Info: Full Allergies Info: Bee venom           Current Medications (05/08/2017):  This is the current hospital active medication list Current Facility-Administered Medications  Medication Dose Route Frequency Provider Last Rate Last Dose  . acetaminophen (TYLENOL) solution 650 mg  650 mg Oral Q6H PRN Cherene Altes, MD      . acetaminophen (TYLENOL) suppository 650 mg  650 mg Rectal Q4H PRN Cherene Altes, MD      .  arformoterol (BROVANA) nebulizer solution 15 mcg  15 mcg Nebulization BID Simonne Maffucci B, MD   15 mcg at 05/08/17 0805  . aspirin tablet 325 mg  325 mg Oral Daily Cherene Altes, MD   325 mg at 05/07/17 1016  . budesonide (PULMICORT) nebulizer solution 0.5 mg  0.5 mg Nebulization BID Simonne Maffucci B, MD   0.5 mg at 05/08/17 0805  . calcium carbonate (TUMS - dosed in mg elemental calcium) chewable  tablet 200 mg of elemental calcium  1 tablet Oral TID Cherene Altes, MD   200 mg of elemental calcium at 05/07/17 2242  . chlorhexidine (PERIDEX) 0.12 % solution 15 mL  15 mL Mouth Rinse BID Simonne Maffucci B, MD   15 mL at 05/07/17 2243  . Chlorhexidine Gluconate Cloth 2 % PADS 6 each  6 each Topical Daily Juanito Doom, MD   6 each at 05/07/17 1016  . clotrimazole (MYCELEX) troche 10 mg  10 mg Oral 5 X Daily Cherene Altes, MD   10 mg at 05/07/17 2244  . dextrose 5 % and 0.9 % NaCl with KCl 20 mEq/L infusion   Intravenous Continuous Cherene Altes, MD 20 mL/hr at 05/07/17 2034    . docusate (COLACE) 50 MG/5ML liquid 50 mg  50 mg Oral Daily PRN Joette Catching T, MD      . feeding supplement (ENSURE ENLIVE) (ENSURE ENLIVE) liquid 237 mL  237 mL Oral TID BM Cherene Altes, MD   237 mL at 05/07/17 2018  . heparin injection 5,000 Units  5,000 Units Subcutaneous Q8H Eloise Levels, MD   5,000 Units at 05/07/17 2243  . ipratropium-albuterol (DUONEB) 0.5-2.5 (3) MG/3ML nebulizer solution 3 mL  3 mL Nebulization Q4H PRN Cherene Altes, MD      . MEDLINE mouth rinse  15 mL Mouth Rinse q12n4p Simonne Maffucci B, MD   15 mL at 05/07/17 1626  . ondansetron (ZOFRAN) tablet 4 mg  4 mg Oral Q6H PRN Cherene Altes, MD       Or  . ondansetron [facility]) injection 4 mg  4 mg Intravenous Q6H PRN Cherene Altes, MD      . pantoprazole (PROTONIX) EC tablet 40 mg  40 mg Oral Q1200 Cherene Altes, MD   40 mg at 05/07/17 1205  . [place]   Oral PRN Cherene Altes, MD      . sennosides (SENOKOT) 8.8 MG/5ML syrup 5 mL  5 mL Oral BID Cherene Altes, MD   5 mL at 05/07/17 2243  . sodium chloride flush (NS) 0.9 % injection 10-40 mL  10-40 mL Intracatheter Q12H Juanito Doom, MD   10 mL at 05/07/17 2031  . white petrolatum (VASELINE) gel   Topical PRN Eloise Levels, MD         Discharge Medications: Please see discharge summary for a list of discharge  medications.  Relevant Imaging Results:  Relevant Lab Results:   Additional Information SS#: [id number]  Candie Chroman, LCSW

## 2017-05-08 NOTE — Progress Notes (Signed)
[place] TEAM 1 - Stepdown/ICU TEAM  Alexandra House  RJJ:[id number] DOB: 1946/09/06 DOA: 04/20/2017 PCP: Marton Redwood, MD    Brief Narrative:  [name] w/ a Hx of Depression, Anxiety, Fibromyalgia, Tobacco abuse with COPD, Osteoporosis, Dyslipidemia, Chronic Systolic and Diastolic CHF, Bilateral Carotid stenosis, and Syncope who presented w/ 3-4 weeks of intractable nausea and vomiting without abdominal pain with an inability to keep anything down. She had been to the ER on several occasions with no definitive etiology found and each time briefly improved after the administration of IV fluids. Outpatient US of the abdomen and EGD were unrevealing.   On the day of her admission she was noted to be hypothermic, tachypneic, and bradycardic with pulses as low as 32 bpm.  Initial blood pressure was 79/39 with an MAP of 48.  She had a creatinine of 3.09. Lactic acid was 6.39. White count was 27,700 with left shift, platelets were 709,000. Troponin elevated at 2.05.   Subjective: Doing well.  Alert and animated.  Denies sob, n/v, or abdom pain.  Was able to walk to the nursing station w/ help today.    Assessment & Plan:  Acute encephalopathy / acute delirium - resolved  No CVA on CT head - B-12 and folate not deficient - PCO2 normal - appears to be slowly improving - suspect this was due to ICU delirium  - avoid sedatives as able - essentially resolved at this time    Intractable nausea and vomiting with weight loss Denies present nausea or vomiting - etiology unclear, but appears to have resolved at this time w/ the pt currently tolerating her diet w/o trouble   Dysphagia Cleared for modified diet - tolerating well  Acute hypoxic respiratory failure - severe emphysema - aspiration pneumonitis B LL completed a course of empiric antibiotic - stable from a respiratory standpoint - weaned to RA   Chronic systolic and diastolic congestive heart failure EF 45% - peripheral edema improved after two  doses of diuretic - appears euvolemic at this time  [facility] Weights   05/06/17 0501 05/07/17 0506 05/08/17 0534  Weight: 46.4 kg (102 lb 3.2 oz) 43.1 kg (95 lb 1.6 oz) 42.3 kg (93 lb 3.2 oz)    Sinus bradycardia resolved   Demand ischemia Trop peak at 4.39 - for outpt f/u as indicated   Anemia Hemoglobin stable w/ no evidence of acute blood loss  Acute renal failure Resolved with volume resuscitation - crt normal   Hypokalemia Corrected with replacement  Hypomagnesemia Corrected to goal of 2.0  Hypophosphatemia  Replaced via IV   Hypocalcemia cont to supplement   Moderate malnutrition in context of chronic illness   DVT prophylaxis: SQ heparin  Code Status: FULL CODE Family Communication: Spoke with husband at bedside at length  Disposition Plan: medically stable for d/c to SNF when bed procured   Consultants:  PCCM Cardiology  Procedures: 7/07 admit with N/V, weakness, hypotension  7/08 intubated 7/12 extubated 7/13 re-intubated 7/16 extubated  7/17 new onset acute confusion  Antimicrobials:  None presently  Objective: Blood pressure (!) 93/56, pulse 88, temperature 97.9 F (36.6 C), temperature source Oral, resp. rate 18, height 5\' 2"  (1.575 m), weight 42.3 kg (93 lb 3.2 oz), SpO2 94 %.  Intake/Output Summary (Last 24 hours) at 05/08/17 1450 Last data filed at 05/08/17 1100  Gross per 24 hour  Intake           415.66 ml  Output  401 ml  Net            14.66 ml   Filed Weights   05/06/17 0501 05/07/17 0506 05/08/17 0534  Weight: 46.4 kg (102 lb 3.2 oz) 43.1 kg (95 lb 1.6 oz) 42.3 kg (93 lb 3.2 oz)    Examination: General: No acute respiratory distress - alert and pleasant  Lungs: CTA B w/o wheezing or crackles  Cardiovascular: RRR - no M  Abdomen: NT/ND, soft, bowel sounds positive, no rebound Extremities: no LE edema B  CBC:  Recent Labs Lab 05/02/17 0550 05/03/17 0327 05/04/17 0327 05/05/17 0217 05/07/17 0337  WBC  10.4 9.4 11.1* 12.6* 9.4  HGB 9.4* 9.3* 9.4* 9.9* 9.0*  HCT 30.0* 30.5* 30.6* 31.3* 28.4*  MCV 90.4 91.0 89.7 88.9 87.9  PLT 295 278 241 205 892   Basic Metabolic Panel:  Recent Labs Lab 05/03/17 0327 05/04/17 0327 05/05/17 0217 05/07/17 0337 05/08/17 0547  NA 136 138 136 140 138  K 4.1 3.6 4.1 3.9 4.1  CL 101 105 102 112* 113*  CO2 28 26 24  21* 20*  GLUCOSE 101* 87 97 78 89  BUN 14 10 9  5* 7  CREATININE 0.83 0.62 0.64 0.66 0.71  CALCIUM 6.4* 6.3* 6.6* 7.4* 7.9*  MG 1.6* 1.7 2.0 1.6* 2.0  PHOS  --  1.8* 3.9  --   --    GFR: Estimated Creatinine Clearance: 41.8 mL/min (by C-G formula based on SCr of 0.71 mg/dL).  Liver Function Tests:  Recent Labs Lab 05/02/17 1849 05/02/17 2044 05/04/17 0327 05/05/17 0217 05/07/17 0337  AST  --  46* 41 54* 50*  ALT  --  61* 45 48 54  ALKPHOS  --  143* 110 124 125  BILITOT  --  1.0 0.9 0.9 0.8  PROT  --  4.9* 4.4* 5.0* 4.7*  ALBUMIN 2.1* 2.0* 1.9* 2.1* 2.0*    CBG:  Recent Labs Lab 05/06/17 1632 05/06/17 1955 05/07/17 0035 05/07/17 0718 05/07/17 1139  GLUCAP 81 90 88 85 111*    Scheduled Meds: . arformoterol  15 mcg Nebulization BID  . aspirin  325 mg Oral Daily  . budesonide (PULMICORT) nebulizer solution  0.5 mg Nebulization BID  . calcium carbonate  1 tablet Oral TID  . chlorhexidine  15 mL Mouth Rinse BID  . Chlorhexidine Gluconate Cloth  6 each Topical Daily  . clotrimazole  10 mg Oral 5 X Daily  . feeding supplement (ENSURE ENLIVE)  237 mL Oral TID BM  . heparin subcutaneous  5,000 Units Subcutaneous Q8H  . mouth rinse  15 mL Mouth Rinse q12n4p  . pantoprazole  40 mg Oral Q1200  . sennosides  5 mL Oral BID  . sodium chloride flush  10-40 mL Intracatheter Q12H     LOS: 18 days   Cherene Altes, MD [facility] Office  [phone] Pager - Text Page per Shea Evans as per below:  On-Call/Text Page:      [email or web address]      password [email or web address]  If 7PM-7AM, please contact  night-coverage [email or web address] Password [email or web address] 05/08/2017, 2:50 PM

## 2017-05-08 NOTE — Clinical Social Work Note (Addendum)
CSW discussed SNF placement with patient. Brother at bedside. Patient is agreeable and stated that her husband is working on that. CSW called patient's husband who stated that he was deciding between [facility] and [facility]. Neither of which have extended a bed offer yet. CSW faxed over updated FL2 with patient's improvements. CSW encouraged patient's husband to make a decision soon because insurance authorization takes about a day to get.  Dayton Scrape, CSW [phone]  10:59 am [facility] has extended a bed offer. [facility] still pending. CSW faxed clinicals to [facility] for review for insurance authorization and left voicemail for Process and Web designer to make her aware.   Dayton Scrape, [place] [phone]  11:29 am Today's PT note faxed to [facility].  Dayton Scrape, [place]  12:43 pm Hospital liaison for [facility] met with patient and her husband and answered all questions. Patient inquiring about [facility]. CSW has left a message for admissions coordinator to review referral.  Dayton Scrape, [place]  2:26 pm [facility] can offer a private room. Patient's husband will discuss with his sons and call CSW with a decision.  Dayton Scrape, [place]  3:43 pm Patient's husband is going to [place] after he leaves the hospital. He will call CSW tonight when he has made a decision so CSW can make the insurance company aware ASAP in the morning.  Dayton Scrape, [place]

## 2017-05-08 NOTE — Progress Notes (Signed)
  Speech Language Pathology Treatment: Dysphagia  Patient Details Name: Alexandra House MRN: [id number] DOB: 07/26/1946 Today's Date: 05/08/2017 Time: 5638-7564 SLP Time Calculation (min) (ACUTE ONLY): 13 min  Assessment / Plan / Recommendation Clinical Impression  Pt's mental status continues to improve. Mastication of cracker with increased efficiency and timeliness. Delayed mild cough primarily with straw. Recommend upgrade to Dys 3 and thin liquids, no straws, sit upright, no talking immediately after swallowing. ST will follow closely.   HPI HPI: Alexandra House an 71 y.o.femalepast medical history of COPD, osteoporosis, fibromyalgia, anxiety relates 4 weeks of intractable nausea vomiting without pain she had an EGD and abdominal ultrasound which were unremarkable. Per chart pt has lost 10 pounds over the last 4 weeks for nausea and vomiting. Found to have acute renal failure. Intubated 7/8-7/12 and reintubated 7/12-7/16. CXR diffuse bilateral pulmonary infiltrates and small bilateral pleural effusions suggesting mild CHF.        SLP Plan  Continue with current plan of care       Recommendations  Diet recommendations: Dysphagia 3 (mechanical soft);Thin liquid Liquids provided via: Cup;No straw Medication Administration: Whole meds with puree Supervision: Patient able to self feed;Intermittent supervision to cue for compensatory strategies Compensations: Minimize environmental distractions;Slow rate;Small sips/bites Postural Changes and/or Swallow Maneuvers: Seated upright 90 degrees                Oral Care Recommendations: Oral care BID Follow up Recommendations:  (TBD) SLP Visit Diagnosis: Dysphagia, oropharyngeal phase (R13.12) Plan: Continue with current plan of care       GO                [place] Siren 05/08/2017, 11:00 AM  Orbie Pyo Colvin Caroli.Ed [employer] [phone]

## 2017-05-09 DIAGNOSIS — M545 Low back pain: Secondary | ICD-10-CM | POA: Diagnosis not present

## 2017-05-09 DIAGNOSIS — M6281 Muscle weakness (generalized): Secondary | ICD-10-CM | POA: Diagnosis not present

## 2017-05-09 DIAGNOSIS — R2681 Unsteadiness on feet: Secondary | ICD-10-CM | POA: Diagnosis not present

## 2017-05-09 DIAGNOSIS — J690 Pneumonitis due to inhalation of food and vomit: Secondary | ICD-10-CM

## 2017-05-09 DIAGNOSIS — M549 Dorsalgia, unspecified: Secondary | ICD-10-CM | POA: Diagnosis not present

## 2017-05-09 DIAGNOSIS — I5042 Chronic combined systolic (congestive) and diastolic (congestive) heart failure: Secondary | ICD-10-CM

## 2017-05-09 DIAGNOSIS — J9601 Acute respiratory failure with hypoxia: Secondary | ICD-10-CM

## 2017-05-09 DIAGNOSIS — R1312 Dysphagia, oropharyngeal phase: Secondary | ICD-10-CM | POA: Diagnosis not present

## 2017-05-09 DIAGNOSIS — J432 Centrilobular emphysema: Secondary | ICD-10-CM | POA: Diagnosis not present

## 2017-05-09 DIAGNOSIS — F419 Anxiety disorder, unspecified: Secondary | ICD-10-CM | POA: Diagnosis not present

## 2017-05-09 DIAGNOSIS — Z720 Tobacco use: Secondary | ICD-10-CM

## 2017-05-09 DIAGNOSIS — R41841 Cognitive communication deficit: Secondary | ICD-10-CM | POA: Diagnosis not present

## 2017-05-09 DIAGNOSIS — R5381 Other malaise: Secondary | ICD-10-CM | POA: Diagnosis not present

## 2017-05-09 DIAGNOSIS — R609 Edema, unspecified: Secondary | ICD-10-CM | POA: Diagnosis not present

## 2017-05-09 DIAGNOSIS — J80 Acute respiratory distress syndrome: Secondary | ICD-10-CM | POA: Diagnosis not present

## 2017-05-09 DIAGNOSIS — R001 Bradycardia, unspecified: Secondary | ICD-10-CM | POA: Diagnosis not present

## 2017-05-09 DIAGNOSIS — J449 Chronic obstructive pulmonary disease, unspecified: Secondary | ICD-10-CM | POA: Diagnosis not present

## 2017-05-09 DIAGNOSIS — G9340 Encephalopathy, unspecified: Secondary | ICD-10-CM | POA: Diagnosis not present

## 2017-05-09 DIAGNOSIS — I509 Heart failure, unspecified: Secondary | ICD-10-CM | POA: Diagnosis not present

## 2017-05-09 DIAGNOSIS — D649 Anemia, unspecified: Secondary | ICD-10-CM | POA: Diagnosis not present

## 2017-05-09 DIAGNOSIS — M546 Pain in thoracic spine: Secondary | ICD-10-CM | POA: Diagnosis not present

## 2017-05-09 MED ORDER — CLOTRIMAZOLE 10 MG MT TROC: 10.0000 mg | Freq: Every day | OROMUCOSAL | 0 refills | Status: DC

## 2017-05-09 MED ORDER — ASPIRIN 325 MG PO TABS: 325.0000 mg | ORAL_TABLET | Freq: Every day | ORAL | 0 refills | Status: DC

## 2017-05-09 MED ORDER — RESOURCE THICKENUP CLEAR PO POWD: ORAL | 0 refills | Status: DC

## 2017-05-09 NOTE — Progress Notes (Signed)
Laying 02 Sats is 96% Sitiing on Bed O2 Sats is 97% Walking without O2  sats were as low as 79%  Patient with 2 Liters O2 2L 100% O2 2L 96%  Jenne Campus N RN 1:12 PM  05-09-2017

## 2017-05-09 NOTE — Progress Notes (Signed)
SATURATION QUALIFICATIONS: (This note is used to comply with regulatory documentation for home oxygen)  Patient Saturations on Room Air at Rest = 96%  Patient Saturations on Room Air while Ambulating = 79%  Patient Saturations on 2 Liters of oxygen while Ambulating = 96%  Please briefly explain why patient needs home oxygen: Patient's oxygen saturation decrease with exertion.  Neta Mends RN 1:32 PM 05-09-2017

## 2017-05-09 NOTE — Progress Notes (Signed)
Occupational Therapy Treatment Patient Details Name: Alexandra House MRN: [id number] DOB: 1946-09-11 Today's Date: 05/09/2017    History of present illness Pt adm with septic shock likely due to aspiration PNA. Pt intubated 7/8 and extubated 7/12. Reintubated 7/13 and extubated 7/16. Pt with bradycardia requiring external pacing on admission. PMH - chf, copd, osteoporosis, depression, syncope   OT comments  This 71 yo female admitted with above presents to acute OT making great progress. She was on 2 liters of O2 when I entered with sats 100% and HR 88; so rest of session performed on RA and monitored--sats remained stable 97-100% with HR up to 107 at end of session. Pt will continue to benefit from acute OT with follow up OT at SNF to get back to an independent level.  Follow Up Recommendations  SNF;Supervision/Assistance - 24 hour    Equipment Recommendations  3 in 1 bedside commode       Precautions / Restrictions Precautions Precautions: Fall Restrictions Weight Bearing Restrictions: No       Mobility Bed Mobility Overal bed mobility: Modified Independent (increased time) Bed Mobility: Supine to Sit;Sit to Supine              Transfers Overall transfer level: Needs assistance Equipment used: Rolling walker (2 wheeled) Transfers: Sit to/from Stand Sit to Stand: Min assist         General transfer comment: cues for hand placement, assist to stand and steady; pt ambulated 45 feet, 45 feet and then 90 feet with 3-5 minute rest breaks inbetween    Balance                                           ADL either performed or assessed with clinical judgement   ADL Overall ADL's : Needs assistance/impaired                     Lower Body Dressing: Sit to/from stand;Minimal assistance Lower Body Dressing Details (indicate cue type and reason): Pt has to hold her leg crossed over the other with one hand while doffing and donning socks due to  weakness and swelling in Bil LEs Toilet Transfer: Minimal assistance;RW;Ambulation                   Vision Patient Visual Report: No change from baseline            Cognition Arousal/Alertness: Awake/alert Behavior During Therapy: WFL for tasks assessed/performed Overall Cognitive Status: Within Functional Limits for tasks assessed                                                     Pertinent Vitals/ Pain       Pain Assessment: No/denies pain         Frequency  Min 2X/week        Progress Toward Goals  OT Goals(current goals can now be found in the care plan section)  Progress towards OT goals: Progressing toward goals     Plan Discharge plan remains appropriate       AM-PAC PT "6 Clicks" Daily Activity     Outcome Measure   Help from another person eating meals?: None Help from another person taking  care of personal grooming?: A Little Help from another person toileting, which includes using toliet, bedpan, or urinal?: A Little Help from another person bathing (including washing, rinsing, drying)?: A Little Help from another person to put on and taking off regular upper body clothing?: A Little Help from another person to put on and taking off regular lower body clothing?: A Little 6 Click Score: 19    End of Session Equipment Utilized During Treatment: Gait belt;Rolling walker  OT Visit Diagnosis: Unsteadiness on feet (R26.81);Muscle weakness (generalized) (M62.81)   Activity Tolerance Patient tolerated treatment well   Patient Left in bed;with call bell/phone within reach;with family/visitor present   Nurse Communication          Time: 7793-9030 OT Time Calculation (min): 25 min  Charges: OT Evaluation $OT Eval Low Complexity: 1 Procedure OT Treatments $Self Care/Home Management : 23-37 mins  [place], OTR/L [phone] 05/09/2017

## 2017-05-09 NOTE — Progress Notes (Signed)
  Speech Language Pathology Treatment: Dysphagia  Patient Details Name: TYAN LASURE MRN: [id number] DOB: 12/18/1945 Today's Date: 05/09/2017 Time: 7939-0300 SLP Time Calculation (min) (ACUTE ONLY): 15 min  Assessment / Plan / Recommendation Clinical Impression  Pt seen for dysphagia therapy after upgrade to Dys 3, thin yesterday. One delayed cough during session (decreased from yesterday). Pt and son deny difficulties since texture change. Pt being discharged to SNF today. Recommend continue Dys 3 (mechanical soft), thin liquids and continue at SNF for upgrade to regular texture.    HPI HPI: Tiffony Kite Trentis an 71 y.o.femalepast medical history of COPD, osteoporosis, fibromyalgia, anxiety relates 4 weeks of intractable nausea vomiting without pain she had an EGD and abdominal ultrasound which were unremarkable. Per chart pt has lost 10 pounds over the last 4 weeks for nausea and vomiting. Found to have acute renal failure. Intubated 7/8-7/12 and reintubated 7/12-7/16. CXR diffuse bilateral pulmonary infiltrates and small bilateral pleural effusions suggesting mild CHF.        SLP Plan  All goals met;Discharge SLP treatment due to (comment)       Recommendations  Diet recommendations: Dysphagia 3 (mechanical soft);Thin liquid Liquids provided via: Cup;No straw Medication Administration: Whole meds with puree Supervision: Patient able to self feed;Intermittent supervision to cue for compensatory strategies Compensations: Minimize environmental distractions;Slow rate;Small sips/bites Postural Changes and/or Swallow Maneuvers: Seated upright 90 degrees                Oral Care Recommendations: Oral care BID Follow up Recommendations: Skilled Nursing facility SLP Visit Diagnosis: Dysphagia, oropharyngeal phase (R13.12) Plan: All goals met;Discharge SLP treatment due to (comment)       GO                [place] Siren 05/09/2017, 12:58 PM   Orbie Pyo Colvin Caroli[employer]Ed [employer] [phone]

## 2017-05-09 NOTE — Progress Notes (Signed)
Patient stable 7p-7a, no complaints or concerns.

## 2017-05-09 NOTE — Clinical Social Work Placement (Signed)
   CLINICAL SOCIAL WORK PLACEMENT  NOTE  Date:  05/09/2017  Patient Details  Name: Alexandra House MRN: [id number] Date of Birth: [birth year]  Clinical Social Work is seeking post-discharge placement for this patient at the [place] level of care (*CSW will initial, date and re-position this form in  chart as items are completed):  Yes   Patient/family provided with [facility] Work Department's list of facilities offering this level of care within the geographic area requested by the patient (or if unable, by the patient's family).  Yes   Patient/family informed of their freedom to choose among providers that offer the needed level of care, that participate in Medicare, Medicaid or managed care program needed by the patient, have an available bed and are willing to accept the patient.  Yes   Patient/family informed of [facility]'s ownership interest in [facility] and [facility], as well as of the fact that they are under no obligation to receive care at these facilities.  PASRR submitted to EDS on 04/30/17     PASRR number received on 04/30/17     Existing PASRR number confirmed on       FL2 transmitted to all facilities in geographic area requested by pt/family on 04/30/17     FL2 transmitted to all facilities within larger geographic area on       Patient informed that his/her managed care company has contracts with or will negotiate with certain facilities, including the following:        Yes   Patient/family informed of bed offers received.  Patient chooses bed at [facility]     Physician recommends and patient chooses bed at      Patient to be transferred to [facility] on 05/09/17.  Patient to be transferred to facility by [facility]     Patient family notified on 05/09/17 of transfer.  Name of family member notified:  Melody Comas     PHYSICIAN Please prepare prescriptions     Additional Comment:     _______________________________________________ Candie Chroman, LCSW 05/09/2017, 2:42 PM

## 2017-05-09 NOTE — Progress Notes (Signed)
Report called in given to nurse Rosey at [place], [place] 3:34 PM 05-09-2017

## 2017-05-09 NOTE — Consult Note (Signed)
   [facility] CM Inpatient Consult   05/09/2017  FENDI MEINHARDT October 09, 1946 [id number]   Update:  Patient to discharge to skilled facility for short term rehab hospital day 19.  Currently, to discharge to  [facility]. Met with the patient and family regarding [employer] needs post rehab.  Patient was given a brochure, contact information.  For questions, please contact:  Natividad Brood, RN BSN [facility] Liaison  [phone] business mobile phone Toll free office [phone]

## 2017-05-09 NOTE — Discharge Summary (Signed)
Physician Discharge Summary  Alexandra House:[id number] DOB: [birth year] DOA: 04/20/2017  PCP: Marton Redwood, MD  Admit date: 04/20/2017 Discharge date: 05/09/2017  Time spent: 35 minutes  Recommendations for Outpatient Follow-up:  Acute encephalopathy -Resolved   Acute Respiratory Failure with hypoxia/Centrilobular Emphysema/Aspiration PNA -Completed course of antibiotics -Pulmicort BID -Schedule follow-up appointment in 1-2 weeks with Dr. Marton Redwood acute respiratory failure with hypoxia/centrilobular emphysema/aspiration pneumonia, tobacco abuse -SATURATION QUALIFICATIONS: (This note is used to comply with regulatory documentation for home oxygen) Patient Saturations on Room Air at Rest = 96% Patient Saturations on Room Air while Ambulating = 79% Patient Saturations on 2 Liters of oxygen while Ambulating = 96% -Titrate O2 to maintain SPO2 89-93%  Tobacco abuse -Counseled patient on sequela of continuing to smoke to include MI, stroke, death   Chronic Systolic and Diastolic CHF -Echocardiogram 2 consistent with CHF see results below -Strict I&O since admission - 10.5 L -Daily weight Filed Weights   05/07/17 0506 05/08/17 0534 05/09/17 0500  Weight: 95 lb 1.6 oz (43.1 kg) 93 lb 3.2 oz (42.3 kg) 95 lb 1.6 oz (43.1 kg)  -Cont ASA -Transfuse for hemoglobin<8 -Schedule follow-up appointment with Dr. Kirk Ruths in 3-4 weeks for chronic systolic and diastolic CHF, bradycardia, pulmonary hypertension. They will determine what further workup is required.  Bradycardia, with junctional escape rhythm, stable -resolved  Pulmonary hypertension -See CHF  Elevated troponin -Demand ischemia  Nausea / Vomiting,   -Resolved   Hypokalemia -Potassium goal> 4  Hypomagnesmia --Magnesium goal>2  Hypocalcemia:  -Corrected calcium= 9.2 -TSH 4.8, borderline for subacute hypothyroidism unfortunately during acute illness T3/T4 of no use -PCP to monitor. Recommend obtain  repeat TSH in 6-8 weeks     Discharge Diagnoses:  Principal Problem:   Hypotension Active Problems:   Severe dehydration   Intractable nausea and vomiting   AKI (acute kidney injury) ([place])   Sinus bradycardia   Hypothermia   HLD (hyperlipidemia)   COPD (chronic obstructive pulmonary disease) (HCC)   Tobacco abuse   Elevated lactic acid level   Elevated troponin   Carotid stenosis, bilateral   Severe sepsis with septic shock (HCC)   NSTEMI (non-ST elevated myocardial infarction) ([place])   Acute respiratory failure with hypoxemia (HCC)   Aspiration pneumonia of both lungs due to vomit ([place])   Septic shock (HCC)   Hypokalemia   Hypocalcemia   Encounter for intubation   Acute encephalopathy   Discharge Condition: Stable  Diet recommendation: Heart healthy  Filed Weights   05/07/17 0506 05/08/17 0534 05/09/17 0500  Weight: 95 lb 1.6 oz (43.1 kg) 93 lb 3.2 oz (42.3 kg) 95 lb 1.6 oz (43.1 kg)    History of present illness:  71 y.o.WF PMHx Depression,Anxiety, FibromyalgiaTobacco abuse with COPD, Osteoporosis, Dyslipidemia, Chronic Systolic and Diastolic CHF, Bilateral Carotid stenosis, Syncope,   For the last 4 weeks patient has developed intractable nausea and vomiting without abdominal pain. She has been unable to keep down anything that she has eaten or drunk. She is not having dysphagia. She is unable to keep medications down. She has been to the ER on several occasions with no definitive etiology found and has briefly improved after the administration of IV fluids. She has undergone outpatient ultrasound of the abdomen and EGD which are also unrevealing. She is not had any fevers or chills. She's had a weight loss of at least 10 pounds over 4 weeks. She's not had any blood in her emesis. Since developing the symptoms she has lost the  urge to smoke. Husband reports that for 6 months prior to the symptoms patient's activity tolerance has markedly decreased and she primarily  complained of dyspnea on exertion noting she was no longer able to do activities she likes such as going shopping.   During her hospitalization patient was treated for acute encephalopathy, acute respiratory failure with hypoxia/centrilobular emphysema/aspiration pneumonia. Patient responded to appropriate antibiotic and supportive care and mentation improved significantly. Unfortunately patient long hospitalization deconditioned. Able for discharge   Procedures: 7/05 [place] ABD >>abdominal US on 7/5 which was negative for gallstones, hepatic abnormality, normal pancreas &no hydronephrosis.  7/07 Admit with N/V, weakness, hypotension   7/7 CT chest/abdomen/pelvis> emphysema, mult-lobar pneumonia, fatty liver, fat stranding around head of pancreas 7/8 Echocardiogram: LVEF=45%.-Hypokinesis of the basal-mid anteroseptal, basal-mid inferoseptal, basal-mid inferior, and apical septal myocardium. - Mitral valve: moderate regurgitation. 7/8 >> intubated 7/12>> extubated 7/13 CT chest/abdomen/pelvis> Moderate B/L effusions, patchy air space disease at bases. Anasarca 7/13>> intubated 7/14 Echocardiogram: LVEF= 45% to 50%. There is akinesis of the basal-midinferoseptal myocardium. There is akinesis of the basal-midinferior myocardium.  - Mitral valve: moderate regurgitation  - Pulmonary arteries: PA peak pressure: 38 mm Hg (S). 7/16 CXR>Diffuse bilateral pulmonary infiltrates and small bilateral pleural effusions suggesting mild CHF. 7/16>>extubated 7/17 CT head wo contrast: No acute intracranial hemorrhage, definite new infarction, mass lesion, midline shift, herniation, hydrocephalus,  7/17>> new onset acute confusion   Consultations: [facility]   Cultures BCx2 7/7>> final negative UC 7/7 >>final negative BCX 7/10>>NGTD x2 days resp culture 7/10 >> few candida albicans BCX 7/13>>negative Ucx 7/13>>negative Sputum cx>>negative   Antibiotics Anti-infectives    Start     Stop    04/28/17 1800  vancomycin (VANCOCIN) 50 mg/mL oral solution 500 mg  Status:  Discontinued     04/30/17 1037   04/26/17 1200  meropenem (MERREM) 1 g in sodium chloride 0.9 % 100 mL IVPB  Status:  Discontinued     04/30/17 1039   04/26/17 1200  linezolid (ZYVOX) IVPB 600 mg  Status:  Discontinued     04/30/17 1039   04/26/17 0700  vancomycin (VANCOCIN) 50 mg/mL oral solution 500 mg  Status:  Discontinued     04/28/17 1634   04/24/17 1400  piperacillin-tazobactam (ZOSYN) IVPB 3.375 g  Status:  Discontinued     04/26/17 1150   04/22/17 0445  vancomycin (VANCOCIN) 500 mg in sodium chloride 0.9 % 100 mL IVPB  Status:  Discontinued     04/24/17 0918   04/20/17 2000  piperacillin-tazobactam (ZOSYN) IVPB 2.25 g  Status:  Discontinued     04/24/17 0807   04/20/17 1130  piperacillin-tazobactam (ZOSYN) IVPB 3.375 g     04/20/17 1346   04/20/17 1130  vancomycin (VANCOCIN) IVPB 1000 mg/200 mL premix     04/20/17 1347       Discharge Exam: Vitals:   05/08/17 2210 05/09/17 0500 05/09/17 0804 05/09/17 1259  BP:  (!) 109/57    Pulse:  95    Resp:  18    Temp:  98.8 F (37.1 C)    TempSrc:  Oral    SpO2: 99% 95% 98% 96%  Weight:  95 lb 1.6 oz (43.1 kg)    Height:        General: A/O 4,, negative acute respiratory distress, cachectic Eyes: negative scleral hemorrhage, negative anisocoria, negative icterus ENT: Negative Runny nose, negative gingival bleeding, Neck:  Negative scars, masses, torticollis, lymphadenopathy, JVD Lungs: diffuse breath sounds with bibasilar rhonchi, negative wheezes  or crackles Cardiovascular: Regular rate and rhythm without murmur gallop or rub normal S1 and S2     Discharge Instructions   Allergies as of 05/09/2017      Reactions   Bee Venom Anaphylaxis, Hives      Medication List    STOP taking these medications   budesonide-formoterol 160-4.5 MCG/ACT inhaler Commonly known as:  SYMBICORT   HYDROcodone-homatropine 5-1.5 MG/5ML syrup Commonly  known as:  HYCODAN   LORazepam 1 MG tablet Commonly known as:  ATIVAN   nitroGLYCERIN 0.4 MG SL tablet Commonly known as:  NITROSTAT   pravastatin 40 MG tablet Commonly known as:  PRAVACHOL     TAKE these medications   albuterol 108 (90 Base) MCG/ACT inhaler Commonly known as:  PROVENTIL HFA;VENTOLIN HFA Inhale 2 puffs into the lungs every 6 (six) hours as needed for wheezing or shortness of breath.   aspirin 325 MG tablet Take 1 tablet (325 mg total) by mouth daily.   benzonatate 100 MG capsule Commonly known as:  TESSALON Take 100 mg by mouth every 8 (eight) hours as needed for cough.   BREO ELLIPTA 100-25 MCG/INH Aepb Generic drug:  fluticasone furoate-vilanterol Inhale 1 puff into the lungs daily.   calcium carbonate 600 MG Tabs tablet Commonly known as:  OS-CAL Take 600 mg by mouth 2 (two) times daily with a meal.   clotrimazole 10 MG troche Commonly known as:  MYCELEX Take 1 tablet (10 mg total) by mouth 5 (five) times daily.   denosumab 60 MG/ML Soln injection Commonly known as:  PROLIA Inject 60 mg into the skin every 6 (six) months. Administer in upper arm, thigh, or abdomen   ENSURE PLUS Liqd Take 237 mLs by mouth daily.   EPINEPHrine 0.15 MG/0.3ML injection Commonly known as:  EPIPEN JR Inject 0.15 mg into the muscle as needed for anaphylaxis.   ibuprofen 200 MG tablet Commonly known as:  ADVIL,MOTRIN Take 200-400 mg by mouth every 6 (six) hours as needed for mild pain.   MULTIVITAMIN GUMMIES ADULT PO Take 2 tablets by mouth daily.   omeprazole 40 MG capsule Commonly known as:  PRILOSEC Take 40 mg by mouth daily.   ondansetron 4 MG tablet Commonly known as:  ZOFRAN Take 1 tablet (4 mg total) by mouth every 8 (eight) hours as needed for nausea or vomiting.   ranitidine 150 MG capsule Commonly known as:  ZANTAC Take 1 capsule (150 mg total) by mouth daily.            Durable Medical Equipment        Start     Ordered   05/09/17 1400   For home use only DME oxygen  Once    Comments:  -SATURATION QUALIFICATIONS: (This note is used to comply with regulatory documentation for home oxygen) Patient Saturations on Room Air at Rest = 96% Patient Saturations on Room Air while Ambulating = 79% Patient Saturations on 2 Liters of oxygen while Ambulating = 96% -Patient is to use 2 L O2 whenever ambulating and at night sleeping,-Titrate O2 to maintain SPO2 89-93% -Provide Inogen oxygen conserving device  Question Answer Comment  Mode or (Route) Nasal cannula   Frequency Continuous (stationary and portable oxygen unit needed)   Oxygen conserving device Yes   Oxygen delivery system Gas      05/09/17 1401     Allergies  Allergen Reactions  . Bee Venom Anaphylaxis and Hives    Contact information for follow-up providers    Brigitte Pulse,  Gwyndolyn Saxon, MD. Schedule an appointment as soon as possible for a visit in 2 week(s).   Specialty:  Internal Medicine Why:  Schedule follow-up appointment in 1-2 weeks with Dr. Marton Redwood acute respiratory failure with hypoxia/centrilobular emphysema/aspiration pneumonia, tobacco abuse Contact information: [place] [address] [phone]        Lelon Perla, MD. Schedule an appointment as soon as possible for a visit in 3 week(s).   Specialty:  Cardiology Why:  Schedule follow-up appointment with Dr. Kirk Ruths in 3-4 weeks for chronic systolic and diastolic CHF, bradycardia, pulmonary hypertension  Contact information: [place] [address] [phone]            Contact information for after-discharge care    Destination    [facility] Follow up.   Specialty:  Skilled Nursing Facility Contact information: [address] [place] [phone]                   The results of significant diagnostics from this hospitalization (including imaging, microbiology, ancillary and laboratory) are  listed below for reference.    Significant Diagnostic Studies: Ct Abdomen Pelvis Wo Contrast  Result Date: 04/26/2017 CLINICAL DATA:  Septic shock. EXAM: CT CHEST, ABDOMEN AND PELVIS WITHOUT CONTRAST TECHNIQUE: Multidetector CT imaging of the chest, abdomen and pelvis was performed following the standard protocol without IV contrast. COMPARISON:  CT abdomen 04/20/2017 FINDINGS: CT CHEST FINDINGS Cardiovascular: Coronary artery calcification and aortic atherosclerotic calcification. Mediastinum/Nodes: No axillary or supraclavicular adenopathy. Small paratracheal and prevascular lymph nodes. No pericardial fluid. NG tube extends into the stomach. Endotracheal to extends into the trachea several cm from carina. Lungs/Pleura: Bilateral moderate size pleural effusions. There is bibasilar patchy airspace disease. No large consolidation. Mild interlobular septal thickening Musculoskeletal: No acute osseous abnormality. CT ABDOMEN AND PELVIS FINDINGS Hepatobiliary: Nonionic contrast imaging demonstrates no focal hepatic lesion. Gallbladder normal. Pancreas: Pancreas is normal. No ductal dilatation. No pancreatic inflammation. Spleen: Normal spleen Adrenals/urinary tract: Adrenal glands are normal. No hydronephrosis. Foley catheter within the bladder. Stomach/Bowel: Feeding tube extends the gastric antrum. No bowel obstruction. No bowel inflammation evident. Appendix is normal. Colon and rectosigmoid colon are normal. Vascular/Lymphatic: Abdominal aorta is normal caliber with atherosclerotic calcification. There is no retroperitoneal or periportal lymphadenopathy. No pelvic lymphadenopathy. Reproductive: Uterus and adnexa normal. Other: Small amount free fluid the pelvis. Significant anasarca of the soft tissues the abdomen pelvis. No evidence abscess in the abdomen pelvis. Musculoskeletal: No aggressive osseous lesion. IMPRESSION: Chest Impression: 1. Bilateral moderate-sized pleural effusions. 2. Patchy airspace  disease in lower lobes representing pneumonia or aspiration pneumonitis. No large consolidation. 3. Mild interstitial edema. 4. Endotracheal tube appears in good position. Abdomen / Pelvis Impression: 1. Anasarca of the soft tissues. 2. Small amount intraperitoneal fluid. 3. No evidence pelvic abscess.  No bowel obstruction. 4. Feeding tube with tip gastric antrum. 5. No hydronephrosis.  Foley catheter in bladder. Electronically Signed   By: Suzy Bouchard M.D.   On: 04/26/2017 17:07   Ct Abdomen Pelvis Wo Contrast  Result Date: 04/20/2017 CLINICAL DATA:  Patient with weakness, nausea and vomiting. EXAM: CT CHEST, ABDOMEN AND PELVIS WITHOUT CONTRAST TECHNIQUE: Multidetector CT imaging of the chest, abdomen and pelvis was performed following the standard protocol without IV contrast. COMPARISON:  CT chest 01/07/2017 FINDINGS: CT CHEST FINDINGS Cardiovascular: Normal heart size. No pericardial effusion. Aortic atherosclerosis. Coronary arterial atherosclerosis. Mediastinum/Nodes: Interval development of mediastinal adenopathy including a 1.1 cm subcarinal lymph  node (image 27; series 3) and a 1.0 cm prevascular lymph node. Additionally there is new fullness within the hila bilaterally most suggestive of hilar adenopathy, poorly visualized without IV contrast. No axillary adenopathy. Small hiatal hernia. Lungs/Pleura: Centrilobular and paraseptal emphysematous changes. Interval development of patchy irregular consolidative opacities throughout the lungs bilaterally with a somewhat basilar predominance. Additionally there is new peribronchial thickening. No pleural effusion or pneumothorax. Musculoskeletal: No aggressive or acute appearing osseous lesions. CT ABDOMEN PELVIS FINDINGS Hepatobiliary: Liver is diffusely low in attenuation compatible with steatosis. Additionally there is suggestion of mild gallbladder wall thickening measuring up to 7 mm (image 66; series 3). There is fat stranding within the porta  hepatis and about the pancreas. Small amount of fluid inferior to the right hepatic lobe. Pancreas: Mild parenchymal atrophy. Small amount of surrounding fat stranding. Spleen: Unremarkable Adrenals/Urinary Tract: Adrenal glands are normal. Kidneys are symmetric in size. No hydronephrosis. Urinary bladder is unremarkable. Stomach/Bowel: No abnormal bowel wall thickening or evidence for bowel obstruction. Decompressed sigmoid colon. Sigmoid colonic diverticulosis without evidence for acute diverticulitis. No free intraperitoneal air. Vascular/Lymphatic: Normal caliber abdominal aorta. No retroperitoneal lymphadenopathy. Reproductive: Uterus and adnexal structures are unremarkable. Other: None. Musculoskeletal: No aggressive or acute appearing osseous lesions. IMPRESSION: Interval development of irregular patchy consolidative opacities throughout the lungs bilaterally concerning for the possibility of multifocal infection. There is fat stranding within the porta hepatis and about the pancreatic parenchyma. Findings are nonspecific in etiology and may be reactive. Sequelae of acalculous cholecystitis, pancreatitis or potentially hepatitis are not excluded. Recommend clinical and laboratory correlation. Hepatic steatosis. These results will be called to the ordering clinician or representative by the Radiologist Assistant, and communication documented in the PACS or zVision Dashboard. Electronically Signed   By: Lovey Newcomer M.D.   On: 04/20/2017 16:03   Dg Chest 2 View  Result Date: 04/14/2017 CLINICAL DATA:  71 year old female with shortness of breath nausea and vomiting for 1 week. Seen in the ED last week with continued symptoms. Cough. EXAM: CHEST  2 VIEW COMPARISON:  04/07/2017 and earlier. FINDINGS: Osteopenia. No acute osseous abnormality identified. Calcified aortic atherosclerosis. Normal cardiac size and mediastinal contours. Visualized tracheal air column is within normal limits. Large lung volumes with  chronic increased interstitial markings seen to correspond to areas of bronchiectasis and emphysema on chest CT 01/07/2017. No superimposed pneumothorax, pleural effusion, or acute pulmonary opacity. Negative visible bowel gas pattern. IMPRESSION: 1. Chronic lung disease with emphysema and bronchiectasis. No superimposed acute findings are identified. 2.  Calcified aortic atherosclerosis. Electronically Signed   By: Genevie Ann M.D.   On: 04/14/2017 16:36   Ct Head Wo Contrast  Result Date: 04/30/2017 CLINICAL DATA:  Acute altered mental status EXAM: CT HEAD WITHOUT CONTRAST TECHNIQUE: Contiguous axial images were obtained from the base of the skull through the vertex without intravenous contrast. COMPARISON:  None available FINDINGS: Brain: Age related brain atrophy and chronic white matter microvascular ischemic changes throughout both cerebral hemispheres. Mild associated ventricular enlargement. No acute intracranial hemorrhage, definite new infarction, mass lesion, midline shift, herniation, hydrocephalus, or extra-axial fluid collection. No focal mass effect or edema. Cisterns are patent. Cerebellar atrophy as well. Vascular: Intracranial atherosclerosis at the skullbase. No hyperdense vessel. Skull: Normal. Negative for fracture or focal lesion. Sinuses/Orbits: No acute finding. Other: None. IMPRESSION: Brain atrophy and chronic white matter microvascular ischemic changes. No acute intracranial abnormality by noncontrast CT. Electronically Signed   By: Jerilynn Mages.  Shick M.D.   On: 04/30/2017 13:04  Ct Chest Wo Contrast  Result Date: 04/26/2017 CLINICAL DATA:  Septic shock. EXAM: CT CHEST, ABDOMEN AND PELVIS WITHOUT CONTRAST TECHNIQUE: Multidetector CT imaging of the chest, abdomen and pelvis was performed following the standard protocol without IV contrast. COMPARISON:  CT abdomen 04/20/2017 FINDINGS: CT CHEST FINDINGS Cardiovascular: Coronary artery calcification and aortic atherosclerotic calcification.  Mediastinum/Nodes: No axillary or supraclavicular adenopathy. Small paratracheal and prevascular lymph nodes. No pericardial fluid. NG tube extends into the stomach. Endotracheal to extends into the trachea several cm from carina. Lungs/Pleura: Bilateral moderate size pleural effusions. There is bibasilar patchy airspace disease. No large consolidation. Mild interlobular septal thickening Musculoskeletal: No acute osseous abnormality. CT ABDOMEN AND PELVIS FINDINGS Hepatobiliary: Nonionic contrast imaging demonstrates no focal hepatic lesion. Gallbladder normal. Pancreas: Pancreas is normal. No ductal dilatation. No pancreatic inflammation. Spleen: Normal spleen Adrenals/urinary tract: Adrenal glands are normal. No hydronephrosis. Foley catheter within the bladder. Stomach/Bowel: Feeding tube extends the gastric antrum. No bowel obstruction. No bowel inflammation evident. Appendix is normal. Colon and rectosigmoid colon are normal. Vascular/Lymphatic: Abdominal aorta is normal caliber with atherosclerotic calcification. There is no retroperitoneal or periportal lymphadenopathy. No pelvic lymphadenopathy. Reproductive: Uterus and adnexa normal. Other: Small amount free fluid the pelvis. Significant anasarca of the soft tissues the abdomen pelvis. No evidence abscess in the abdomen pelvis. Musculoskeletal: No aggressive osseous lesion. IMPRESSION: Chest Impression: 1. Bilateral moderate-sized pleural effusions. 2. Patchy airspace disease in lower lobes representing pneumonia or aspiration pneumonitis. No large consolidation. 3. Mild interstitial edema. 4. Endotracheal tube appears in good position. Abdomen / Pelvis Impression: 1. Anasarca of the soft tissues. 2. Small amount intraperitoneal fluid. 3. No evidence pelvic abscess.  No bowel obstruction. 4. Feeding tube with tip gastric antrum. 5. No hydronephrosis.  Foley catheter in bladder. Electronically Signed   By: Suzy Bouchard M.D.   On: 04/26/2017 17:07   Ct  Chest Wo Contrast  Result Date: 04/20/2017 CLINICAL DATA:  Patient with weakness, nausea and vomiting. EXAM: CT CHEST, ABDOMEN AND PELVIS WITHOUT CONTRAST TECHNIQUE: Multidetector CT imaging of the chest, abdomen and pelvis was performed following the standard protocol without IV contrast. COMPARISON:  CT chest 01/07/2017 FINDINGS: CT CHEST FINDINGS Cardiovascular: Normal heart size. No pericardial effusion. Aortic atherosclerosis. Coronary arterial atherosclerosis. Mediastinum/Nodes: Interval development of mediastinal adenopathy including a 1.1 cm subcarinal lymph node (image 27; series 3) and a 1.0 cm prevascular lymph node. Additionally there is new fullness within the hila bilaterally most suggestive of hilar adenopathy, poorly visualized without IV contrast. No axillary adenopathy. Small hiatal hernia. Lungs/Pleura: Centrilobular and paraseptal emphysematous changes. Interval development of patchy irregular consolidative opacities throughout the lungs bilaterally with a somewhat basilar predominance. Additionally there is new peribronchial thickening. No pleural effusion or pneumothorax. Musculoskeletal: No aggressive or acute appearing osseous lesions. CT ABDOMEN PELVIS FINDINGS Hepatobiliary: Liver is diffusely low in attenuation compatible with steatosis. Additionally there is suggestion of mild gallbladder wall thickening measuring up to 7 mm (image 66; series 3). There is fat stranding within the porta hepatis and about the pancreas. Small amount of fluid inferior to the right hepatic lobe. Pancreas: Mild parenchymal atrophy. Small amount of surrounding fat stranding. Spleen: Unremarkable Adrenals/Urinary Tract: Adrenal glands are normal. Kidneys are symmetric in size. No hydronephrosis. Urinary bladder is unremarkable. Stomach/Bowel: No abnormal bowel wall thickening or evidence for bowel obstruction. Decompressed sigmoid colon. Sigmoid colonic diverticulosis without evidence for acute diverticulitis.  No free intraperitoneal air. Vascular/Lymphatic: Normal caliber abdominal aorta. No retroperitoneal lymphadenopathy. Reproductive: Uterus and adnexal structures are unremarkable.  Other: None. Musculoskeletal: No aggressive or acute appearing osseous lesions. IMPRESSION: Interval development of irregular patchy consolidative opacities throughout the lungs bilaterally concerning for the possibility of multifocal infection. There is fat stranding within the porta hepatis and about the pancreatic parenchyma. Findings are nonspecific in etiology and may be reactive. Sequelae of acalculous cholecystitis, pancreatitis or potentially hepatitis are not excluded. Recommend clinical and laboratory correlation. Hepatic steatosis. These results will be called to the ordering clinician or representative by the Radiologist Assistant, and communication documented in the PACS or zVision Dashboard. Electronically Signed   By: Lovey Newcomer M.D.   On: 04/20/2017 16:03   US Abdomen Complete  Result Date: 04/18/2017 CLINICAL DATA:  Nausea, vomiting for 2 weeks, weakness EXAM: ABDOMEN ULTRASOUND COMPLETE COMPARISON:  None. FINDINGS: Gallbladder: The gallbladder is visualized and no gallstones are noted. There is no pain over the gallbladder with compression. Common bile duct: Diameter: The common bile duct is normal measuring 3.7 mm in diameter. Liver: The liver has a normal echogenic pattern. No focal hepatic abnormality is seen. IVC: No abnormality visualized. Pancreas: The pancreas is moderately well visualized with only the most distal tail not definitely seen. Spleen: The spleen is difficult to visualize with the best measurement being 3.7 cm. Right Kidney: Length: 7.8 cm.  No hydronephrosis is seen. Left Kidney: Length: 8.1 cm.  No hydronephrosis is noted. Both kidneys appear somewhat small. Abdominal aorta: Atherosclerotic change of the abdominal aorta is noted without evidence of focal aneurysm. Other findings: None. IMPRESSION:  1. No gallstones.  No ductal dilatation. 2. No hepatic abnormality is seen. 3. The pancreas is moderately well seen with no abnormality. Only the distal tail may be obscured. 4. No hydronephrosis.  Both kidneys are somewhat small however. 5. Abdominal aortic atherosclerosis. Electronically Signed   By: Ivar Drape M.D.   On: 04/18/2017 11:20   Dg Chest Port 1 View  Result Date: 04/29/2017 CLINICAL DATA:  Respiratory failure. EXAM: PORTABLE CHEST 1 VIEW COMPARISON:  04/28/2017. FINDINGS: Endotracheal tube, NG tube, left IJ line in stable position. Heart size stable. Mild bilateral pulmonary infiltrates/edema and small bilateral pleural effusions noted suggesting mild CHF. No pneumothorax . Stable position. Heart size normal. Diffuse bilateral pulmonary infiltrates IMPRESSION: 1. Lines and tubes in stable position. 2. Diffuse bilateral pulmonary infiltrates and small bilateral pleural effusions suggesting mild CHF. Electronically Signed   By: Marcello Moores  Register   On: 04/29/2017 06:39   Dg Chest Port 1 View  Result Date: 04/28/2017 CLINICAL DATA:  ET tube, acute respiratory failure EXAM: PORTABLE CHEST 1 VIEW COMPARISON:  04/27/2017 FINDINGS: Endotracheal tube is 4 cm above the carina, stable. Remainder of the support devices are stable. Biapical pleural thickening/ scarring. Left lower lobe atelectasis or infiltrate with small left effusion. Heart is upper limits normal in size. Mild interstitial prominence is stable. IMPRESSION: No significant change since prior study. Electronically Signed   By: Rolm Baptise M.D.   On: 04/28/2017 07:41   Portable Chest Xray  Result Date: 04/27/2017 CLINICAL DATA:  Smoker. Respiratory failure; on vent. EXAM: PORTABLE CHEST 1 VIEW COMPARISON:  04/26/2017 FINDINGS: Irregular interstitial thickening noted on the prior study has improved. There is less opacity at the lung bases. There still mild bilateral interstitial thickening associated with prominent bronchovascular  markings. Lungs are hyperexpanded. Residual hazy lung base opacity is most noted on the left consistent with a small pleural effusion. No pneumothorax. The endotracheal tube, nasogastric tube and left internal jugular central venous line are stable and well  positioned. IMPRESSION: 1. Improved lung aeration when compared to the previous day's study. This is most consistent with improved interstitial pulmonary edema and decreased pleural effusions. No new abnormalities. 2. Support apparatus is stable and well positioned. Electronically Signed   By: Lajean Manes M.D.   On: 04/27/2017 07:36   Dg Chest Port 1 View  Result Date: 04/26/2017 CLINICAL DATA:  Endotracheal tube repositioning. EXAM: PORTABLE CHEST 1 VIEW COMPARISON:  Radiograph of same day. FINDINGS: Stable cardiomediastinal silhouette. Nasogastric tube is seen entering the stomach. Left internal jugular catheter is unchanged with distal tip in expected position of the SVC. Endotracheal tube has been partially withdrawn with distal tip 2 cm above the carina. Stable bilateral perihilar and basilar interstitial densities are noted concerning for edema with mild associated pleural effusions. Bony thorax unremarkable. IMPRESSION: Endotracheal tube is in grossly good position at this time. Stable bilateral lung opacities are noted consistent with edema and possible effusions. Electronically Signed   By: Marijo Conception, M.D.   On: 04/26/2017 10:27   Dg Chest Port 1 View  Result Date: 04/26/2017 CLINICAL DATA:  Hypoxia EXAM: PORTABLE CHEST 1 VIEW COMPARISON:  Study obtained earlier in the day FINDINGS: Endotracheal tube tip is at the carina with the tip directed toward the right main bronchus. Left jugular catheter tip is in the superior cava. Nasogastric tube tip and side port below the diaphragm. No pneumothorax. There are small pleural effusions bilaterally with mild interstitial edema. No consolidation appreciable. Heart is mildly enlarged with pulmonary  venous hypertension. No evident adenopathy. No bone lesions. IMPRESSION: Tube and catheter positions as described without pneumothorax. Note that the endotracheal tube tip is at the carina directed toward the right main bronchus. Advise withdrawing endotracheal tube approximately 3 cm. Findings indicative of a degree of congestive heart failure. No appreciable consolidation. Critical Value/emergent results were called by telephone at the time of interpretation on 04/26/2017 at 7:08 am to Josue Hector, RN , who verbally acknowledged these results. Electronically Signed   By: Lowella Grip III M.D.   On: 04/26/2017 07:10   Dg Chest Port 1 View  Result Date: 04/26/2017 CLINICAL DATA:  Initial evaluation for acute respiratory failure. EXAM: PORTABLE CHEST 1 VIEW COMPARISON:  Prior radiograph from 04/25/2017. FINDINGS: Endotracheal and enteric tubes have been removed. Left IJ approach centra venous catheter remains in place with tip overlying the cavoatrial junction. Cardiac and mediastinal silhouettes are stable, and remain within normal limits. Lungs normally inflated. Probable small bilateral pleural effusions. Left basilar opacity likely reflects atelectasis. Mild perihilar vascular congestion with interstitial prominence, suggesting mild pulmonary interstitial edema. Changes slightly increased from previous. No pneumothorax. Osseous structures unchanged. IMPRESSION: 1. Slightly worsened pulmonary vascular congestion with diffuse interstitial prominence, suggesting mild pulmonary interstitial edema. Probable small bilateral pleural effusions. 2. Interval extubation. Electronically Signed   By: Jeannine Boga M.D.   On: 04/26/2017 01:56   Dg Chest Port 1 View  Result Date: 04/25/2017 CLINICAL DATA:  Respiratory failure EXAM: PORTABLE CHEST 1 VIEW COMPARISON:  04/24/2017 FINDINGS: Support devices are stable. There is hyperinflation of the lungs compatible with COPD. She bilateral perihilar and lower  lobe opacities are noted which could reflect edema or pneumonia. Small bilateral effusions. IMPRESSION: Bilateral perihilar and lower lobe opacities could reflect edema or pneumonia. COPD. Electronically Signed   By: Rolm Baptise M.D.   On: 04/25/2017 08:08   Dg Chest Port 1 View  Result Date: 04/24/2017 CLINICAL DATA:  Intubation EXAM: PORTABLE CHEST 1 VIEW COMPARISON:  Portable exam 0857 hours compared to 04/23/2017 FINDINGS: Tip of endotracheal tube projects 3.4 cm above carina. Nasogastric tube extends into stomach. LEFT jugular central venous catheter tip projects over cavoatrial junction. Normal heart size, mediastinal contours, and pulmonary vascularity. Atherosclerotic calcification at aortic arch. Emphysematous and bronchitic changes with biapical scarring greater on RIGHT. Scattered interstitial infiltrates likely pulmonary edema. Minimal bibasilar atelectasis. No pneumothorax. Bones demineralized. IMPRESSION: Line and tube positions as above Emphysematous changes with bibasilar atelectasis and probable mild pulmonary edema. Aortic Atherosclerosis (ICD10-I70.0) and Emphysema (ICD10-J43.9). Electronically Signed   By: Lavonia Dana M.D.   On: 04/24/2017 09:11   Dg Chest Port 1 View  Result Date: 04/23/2017 CLINICAL DATA:  Respiratory failure, evaluate endotracheal tube position EXAM: PORTABLE CHEST 1 VIEW COMPARISON:  Portable chest x-ray of 04/22/2017 FINDINGS: The tip of the endotracheal tube is approximately 1.9 cm above the carina. Left central venous line tip overlies the lower SVC. Minimal pulmonary vascular congestion is present. Small pleural effusions cannot be excluded. Heart size is stable. IMPRESSION: 1. Tip of endotracheal tube 1.9 cm above the carina. 2. Little change in probable mild pulmonary vascular congestion and small effusions. Electronically Signed   By: Ivar Drape M.D.   On: 04/23/2017 11:09   Dg Chest Port 1 View  Result Date: 04/22/2017 CLINICAL DATA:  Initial evaluation  for acute respiratory failure with hypoxia. EXAM: PORTABLE CHEST 1 VIEW COMPARISON:  Prior radiograph from 04/21/2017. FINDINGS: Patient remains intubated with the tip of the endotracheal tube positioned approximately 2.7 cm above the carina. Left IJ approach centra venous catheter remains in place with tip overlying the distal SVC. Enteric tube courses in the the abdomen. Stable cardiac and mediastinal silhouettes. Lungs mildly hypoinflated. Similar bilateral coarse interstitial opacities. Slightly worsened hazy density within the peripheral right lung base. Suspected small right pleural effusion. No pulmonary edema. No pneumothorax. Osseous structures unchanged. IMPRESSION: 1. Support apparatus in satisfactory position as above. 2. Similar coarse bilateral interstitial opacities, which may reflect sequelae of underlying infection in the correct clinical setting. Slightly increased hazy opacity at the peripheral right lung base as compared to previous. 3. Suspected small right pleural effusion. Electronically Signed   By: Jeannine Boga M.D.   On: 04/22/2017 06:53   Dg Chest Port 1 View  Result Date: 04/21/2017 CLINICAL DATA:  Patient status post intubation. Acute respiratory failure. EXAM: PORTABLE CHEST 1 VIEW COMPARISON:  Chest radiograph 04/21/2017 FINDINGS: ET tube terminates in the proximal right mainstem bronchus. Left IJ central venous catheter tip projects over the superior vena cava. Enteric tube appears coiled overlying the thoracic inlet. Pacer pad overlies the left hemithorax. Monitoring leads overlie the patient. Stable cardiac and mediastinal contours. Grossly unchanged diffuse bilateral coarse interstitial pulmonary opacities. No pleural effusion or pneumothorax. IMPRESSION: ET tube terminates in the proximal right mainstem bronchus, recommend retraction approximately 2.5 cm. Enteric tube appears coiled at the thoracic inlet, recommend repositioning. Similar-appearing diffuse bilateral  coarse interstitial pulmonary opacities concerning for pneumonia in the appropriate clinical setting. Followup PA and lateral chest X-ray is recommended in 3-4 weeks following trial of antibiotic therapy to ensure resolution and exclude underlying malignancy. These results were called by telephone at the time of interpretation on 04/21/2017 at 2:17 pm to Nurse Eilleen Kempf , who verbally acknowledged these results. Electronically Signed   By: Lovey Newcomer M.D.   On: 04/21/2017 14:24   Dg Chest Port 1 View  Result Date: 04/21/2017 CLINICAL DATA:  Shortness of breath.  COPD.  Syncope.  Smoker. EXAM: PORTABLE CHEST 1 VIEW COMPARISON:  CT 04/20/2017 plain films 04/20/2017. FINDINGS: External pacer projects over the left side of the chest. Midline trachea. Mild cardiomegaly. Biapical pleural thickening. No pleural effusion or pneumothorax. Since the prior plain film, patchy right lower and left perihilar airspace disease is consistent with developing infection. This is superimposed upon hyperinflation and chronic interstitial thickening of COPD/chronic bronchitis. IMPRESSION: Multifocal airspace opacities, most consistent with infection. Appears progressive since the radiograph of 1 day prior but grossly similar to yesterday's CT. Electronically Signed   By: Abigail Miyamoto M.D.   On: 04/21/2017 12:03   Dg Chest Port 1 View  Result Date: 04/20/2017 CLINICAL DATA:  Patient with continued weakness, nausea and vomiting. EXAM: PORTABLE CHEST 1 VIEW COMPARISON:  Chest radiograph 04/14/2017 FINDINGS: Monitoring leads overlie the patient. Normal cardiac and mediastinal contours. Grossly unchanged coarse interstitial pulmonary opacities. Biapical pleuroparenchymal thickening. No pleural effusion or pneumothorax. IMPRESSION: No acute cardiopulmonary process. Chronic interstitial pulmonary opacities. Electronically Signed   By: Lovey Newcomer M.D.   On: 04/20/2017 11:14   Dg Abd Portable 1v  Result Date: 05/04/2017 CLINICAL  DATA:  NG tube placement.  Prior prior study earlier today. EXAM: PORTABLE ABDOMEN - 1 VIEW COMPARISON:  None. FINDINGS: A gastric tube is seen below the left hemidiaphragm. The tip may be in the expected location of the pylorus or duodenal bulb with side-port in the region of the expected gastric antrum. Enteric contrast is noted within jejunum the distal ileum and train cecum and ascending colon. No bowel obstruction or free air. IMPRESSION: 1. The tip of a gastric tube is seen in the right upper quadrant possibly in the expected location of the duodenum bulb or pylorus of the stomach. The side port appears to be expected location of the gastric antrum. 2. Enteric contrast has entered large bowel. Electronically Signed   By: Ashley Royalty M.D.   On: 05/04/2017 19:25   Dg Abd Portable 1v  Result Date: 04/26/2017 CLINICAL DATA:  Colitis. EXAM: PORTABLE ABDOMEN - 1 VIEW COMPARISON:  04/21/2017 FINDINGS: Nasogastric terminates at the distal stomach. Extensive overlying support apparatus including EKG leads and wires. No gross free intraperitoneal air. Nonobstructive bowel gas pattern. Distal gas and stool. Vascular calcifications. IMPRESSION: No acute findings. Electronically Signed   By: Abigail Miyamoto M.D.   On: 04/26/2017 09:11   Dg Abd Portable 1v  Result Date: 04/21/2017 CLINICAL DATA:  Orogastric tube placement EXAM: PORTABLE ABDOMEN - 1 VIEW COMPARISON:  CT abdomen pelvis April 20, 2017 FINDINGS: Orogastric tube tip and side port are in the stomach. There is no bowel dilatation or air-fluid level to suggest bowel obstruction. No free air. There is aortoiliac atherosclerosis. Visualized lung bases are clear. IMPRESSION: Orogastric tube tip and side port in stomach. No bowel obstruction or free air evident. There is aortoiliac atherosclerosis. Aortic Atherosclerosis (ICD10-I70.0). Electronically Signed   By: Lowella Grip III M.D.   On: 04/21/2017 15:32   Dg Swallowing Func-speech Pathology  Result Date:  05/04/2017 Objective Swallowing Evaluation: Type of Study: MBS-Modified Barium Swallow Study Patient Details Name: THOMASA HEIDLER MRN: [id number] Date of Birth: [birth year] Today's Date: 05/04/2017 Time: SLP Start Time (ACUTE ONLY): 1315-SLP Stop Time (ACUTE ONLY): 1340 SLP Time Calculation (min) (ACUTE ONLY): 25 min Past Medical History: Past Medical History: Diagnosis Date . Anxiety  . Carotid stenosis  . Fibromyalgia  . HLD (hyperlipidemia)  . Hx of cardiovascular stress test   a. Lex MV 11/13: + significant ECG changes  and CHB with infusion; EF 81%, no ischemia,  . Hx of echocardiogram   a. Echo 12/12: EF 88-41%, grade 2 diastolic dysfunction, mild MR.; b. Echo 11/13:  mild LVH, EF 60-65% . Neuropathy  . Osteoporosis  . Syncope 08/2009  a. ETT-Echo 1/11:  poor ex tol, submax exercise, no WMA or ECG changes c/w ischemia;   Past Surgical History: Past Surgical History: Procedure Laterality Date . None   HPI: Demetra Moya Trentis an 71 y.o.femalepast medical history of COPD, osteoporosis, fibromyalgia, anxiety relates 4 weeks of intractable nausea vomiting without pain she had an EGD and abdominal ultrasound which were unremarkable. Per chart pt has lost 10 pounds over the last 4 weeks for nausea and vomiting. Found to have acute renal failure. Intubated 7/8-7/12 and reintubated 7/12-7/16. CXR diffuse bilateral pulmonary infiltrates and small bilateral pleural effusions suggesting mild CHF.   Subjective: intermittently alert/lethargic Assessment / Plan / Recommendation CHL IP CLINICAL IMPRESSIONS 05/04/2017 Clinical Impression Patient presents with moderate-severe oral and moderate pharyngeal dysphagia which appears mostly cognitively and sensory-based. She presents with moderate-severe risk for aspiration. Oral stage of swallowing characterized by anterior oral spillage, weak lingual manipulation, delayed anterior to posterior transit, reduced bolus cohesion with premature spillage of liquids to the pyriform sinuses,  piecemeal deglutition, oral residue which spills into the pharynx after the swallow. No overt aspiration observed with thin or nectar thick liquids, however with cough x3 between frames. Suspect compromised airway protection with residue. Pharyngeal stage of swallowing noted for reduced base of tongue retraction with moderate residue in the valleculae, pyriform sinuses. With honey-thick liquids, trace aspiration x1 of residue during subsequent swallow. Pt is at risk of aspiration of all consistencies given poor oral containment, delayed swallow initiation, and pharyngeal residue. Unable to trial any compensatory techniques during this examination as pt unable to follow positional cues. Pt's sister present for the examination; provided extensive education re: results of assessment, risks for aspiration and recommendations that pt remain NPO at this time with the exception of ice chips after thorough oral care. Anticipate pt will be able to initiate diet as her mental status improves, but given her presentation today she remains at moderate-severe risk for aspiration. SLP will f/u next date for improvements at bedside.   SLP Visit Diagnosis Dysphagia, oropharyngeal phase (R13.12) Attention and concentration deficit following -- Frontal lobe and executive function deficit following -- Impact on safety and function Severe aspiration risk   CHL IP TREATMENT RECOMMENDATION 05/04/2017 Treatment Recommendations Therapy as outlined in treatment plan below   Prognosis 05/04/2017 Prognosis for Safe Diet Advancement Good Barriers to Reach Goals Cognitive deficits Barriers/Prognosis Comment -- CHL IP DIET RECOMMENDATION 05/04/2017 SLP Diet Recommendations NPO;Alternative means - temporary;Ice chips PRN after oral care Liquid Administration via -- Medication Administration -- Compensations -- Postural Changes --   CHL IP OTHER RECOMMENDATIONS 05/04/2017 Recommended Consults -- Oral Care Recommendations Oral care QID;Other (Comment)  Other Recommendations --   CHL IP FOLLOW UP RECOMMENDATIONS 05/04/2017 Follow up Recommendations Skilled Nursing facility   [facility] IP FREQUENCY AND DURATION 05/04/2017 Speech Therapy Frequency (ACUTE ONLY) min 2x/week Treatment Duration 2 weeks      CHL IP ORAL PHASE 05/04/2017 Oral Phase Impaired Oral - Pudding Teaspoon -- Oral - Pudding Cup -- Oral - Honey Teaspoon Lingual pumping;Weak lingual manipulation;Holding of bolus;Reduced posterior propulsion;Piecemeal swallowing;Delayed oral transit;Decreased bolus cohesion Oral - Honey Cup -- Oral - Nectar Teaspoon -- Oral - Nectar Cup Lingual pumping;Weak lingual manipulation;Right anterior bolus loss;Reduced posterior propulsion;Holding of bolus;Lingual/palatal residue;Piecemeal  swallowing;Delayed oral transit;Decreased bolus cohesion;Premature spillage Oral - Nectar Straw Lingual pumping;Right anterior bolus loss;Reduced posterior propulsion;Holding of bolus;Lingual/palatal residue;Piecemeal swallowing;Delayed oral transit;Decreased bolus cohesion;Premature spillage Oral - Thin Teaspoon Right anterior bolus loss;Lingual pumping;Weak lingual manipulation;Reduced posterior propulsion;Holding of bolus;Piecemeal swallowing;Premature spillage;Decreased bolus cohesion Oral - Thin Cup Lingual pumping;Weak lingual manipulation;Reduced posterior propulsion;Holding of bolus;Piecemeal swallowing;Decreased bolus cohesion;Premature spillage Oral - Thin Straw -- Oral - Puree Weak lingual manipulation;Lingual pumping;Reduced posterior propulsion;Holding of bolus;Piecemeal swallowing;Delayed oral transit;Decreased bolus cohesion Oral - Mech Soft -- Oral - Regular Impaired mastication;Weak lingual manipulation;Lingual pumping;Holding of bolus;Lingual/palatal residue;Delayed oral transit;Decreased bolus cohesion Oral - Multi-Consistency -- Oral - Pill Decreased bolus cohesion;Piecemeal swallowing Oral Phase - Comment --  CHL IP PHARYNGEAL PHASE 05/04/2017 Pharyngeal Phase Impaired  Pharyngeal- Pudding Teaspoon -- Pharyngeal -- Pharyngeal- Pudding Cup -- Pharyngeal -- Pharyngeal- Honey Teaspoon Delayed swallow initiation-pyriform sinuses;Reduced tongue base retraction;Penetration/Aspiration during swallow;Trace aspiration;Pharyngeal residue - valleculae Pharyngeal Material enters airway, passes BELOW cords and not ejected out despite cough attempt by patient Pharyngeal- Honey Cup -- Pharyngeal -- Pharyngeal- Nectar Teaspoon -- Pharyngeal -- Pharyngeal- Nectar Cup Delayed swallow initiation-pyriform sinuses;Reduced tongue base retraction;Pharyngeal residue - valleculae;Pharyngeal residue - pyriform Pharyngeal -- Pharyngeal- Nectar Straw -- Pharyngeal -- Pharyngeal- Thin Teaspoon -- Pharyngeal -- Pharyngeal- Thin Cup Delayed swallow initiation-pyriform sinuses;Reduced tongue base retraction;Pharyngeal residue - valleculae;Pharyngeal residue - pyriform Pharyngeal -- Pharyngeal- Thin Straw -- Pharyngeal -- Pharyngeal- Puree Delayed swallow initiation-vallecula;Reduced tongue base retraction;Pharyngeal residue - valleculae Pharyngeal -- Pharyngeal- Mechanical Soft -- Pharyngeal -- Pharyngeal- Regular Reduced tongue base retraction;Delayed swallow initiation-vallecula;Pharyngeal residue - valleculae Pharyngeal -- Pharyngeal- Multi-consistency -- Pharyngeal -- Pharyngeal- Pill NT Pharyngeal -- Pharyngeal Comment --  CHL IP CERVICAL ESOPHAGEAL PHASE 05/04/2017 Cervical Esophageal Phase WFL Pudding Teaspoon -- Pudding Cup -- Honey Teaspoon -- Honey Cup -- Nectar Teaspoon -- Nectar Cup -- Nectar Straw -- Thin Teaspoon -- Thin Cup -- Thin Straw -- Puree -- Mechanical Soft -- Regular -- Multi-consistency -- Pill -- Cervical Esophageal Comment -- No flowsheet data found. Deneise Lever, [place], CCC-SLP Speech-Language Pathologist [phone] [name] 05/04/2017, 4:19 PM               Microbiology: No results found for this or any previous visit (from the past 240 hour(s)).   Labs: Basic Metabolic  Panel:  Recent Labs Lab 05/03/17 0327 05/04/17 0327 05/05/17 0217 05/07/17 0337 05/08/17 0547  NA 136 138 136 140 138  K 4.1 3.6 4.1 3.9 4.1  CL 101 105 102 112* 113*  CO2 28 26 24  21* 20*  GLUCOSE 101* 87 97 78 89  BUN 14 10 9  5* 7  CREATININE 0.83 0.62 0.64 0.66 0.71  CALCIUM 6.4* 6.3* 6.6* 7.4* 7.9*  MG 1.6* 1.7 2.0 1.6* 2.0  PHOS  --  1.8* 3.9  --   --    Liver Function Tests:  Recent Labs Lab 05/02/17 1849 05/02/17 2044 05/04/17 0327 05/05/17 0217 05/07/17 0337  AST  --  46* 41 54* 50*  ALT  --  61* 45 48 54  ALKPHOS  --  143* 110 124 125  BILITOT  --  1.0 0.9 0.9 0.8  PROT  --  4.9* 4.4* 5.0* 4.7*  ALBUMIN 2.1* 2.0* 1.9* 2.1* 2.0*   No results for input(s): LIPASE, AMYLASE in the last 168 hours. No results for input(s): AMMONIA in the last 168 hours. CBC:  Recent Labs Lab 05/03/17 0327 05/04/17 0327 05/05/17 0217 05/07/17 0337  WBC 9.4 11.1* 12.6* 9.4  HGB 9.3* 9.4*  9.9* 9.0*  HCT 30.5* 30.6* 31.3* 28.4*  MCV 91.0 89.7 88.9 87.9  PLT 278 241 205 171   Cardiac Enzymes: No results for input(s): CKTOTAL, CKMB, CKMBINDEX, TROPONINI in the last 168 hours. BNP: BNP (last 3 results)  Recent Labs  04/21/17 1357  BNP 1,528.1*    ProBNP (last 3 results) No results for input(s): PROBNP in the last 8760 hours.  CBG:  Recent Labs Lab 05/06/17 1632 05/06/17 1955 05/07/17 0035 05/07/17 0718 05/07/17 1139  GLUCAP 81 90 88 85 111*       Signed:  Dia Crawford, MD [facility] [phone] pager

## 2017-05-09 NOTE — Clinical Social Work Note (Addendum)
Patient's husband has chosen [facility] [facility]. CSW notified [employer] this morning. They will send clinicals to their nurse for review.  Dayton Scrape, [place] [phone]  10:22 am Received call from [place], [place] at [facility]. She stated that she only received 13/44 pages that were faxed over. CSW faxed clinicals again to her direct fax number.  Dayton Scrape, [place] [phone]  11:42 am Insurance authorization obtained: [id number]. CSW notified RN, MD, SNF admissions coordinator, patient and her son. Husband is aware as he was on the phone with his son while CSW was in the room.   Dayton Scrape, [place]

## 2017-05-09 NOTE — Clinical Social Work Note (Addendum)
CSW facilitated patient discharge including contacting patient family and facility to confirm patient discharge plans. Clinical information faxed to facility and family agreeable with plan. CSW arranged ambulance transport via [facility] to [facility]. Patient's husband initially wanted to transport patient by car but patient now requiring 2 L of oxygen and we are unable to send a temporary oxygen tank for the car. Patient's husband expressed understanding, he just did not want to be charged for the ambulance ride. CSW explained that as long as she has a medical need for transport (oxygen), the ambulance will bill her insurance and he should not be charged for private pay. RN to call report prior to discharge [phone] Room 706P in [facility]).  CSW will sign off for now as social work intervention is no longer needed. Please consult [place] again if new needs arise.  Dayton Scrape, [place]  3:39 pm [facility] unable to provide hospital oxygen tank for car transport unless patient is already set up with [facility] due to liability. Patient states she does not have [facility]. Patient is agreeable to [facility] and, per RN, told her husband that as well on the phone. CSW made patient and her niece aware of information regarding [facility] and the oxygen tank. Surveyor, quantity of social work updated. Patient has left with [facility]. CSW signing off.  Dayton Scrape, [place]

## 2017-05-13 DIAGNOSIS — R609 Edema, unspecified: Secondary | ICD-10-CM | POA: Diagnosis not present

## 2017-05-13 DIAGNOSIS — F419 Anxiety disorder, unspecified: Secondary | ICD-10-CM | POA: Diagnosis not present

## 2017-05-15 DIAGNOSIS — D649 Anemia, unspecified: Secondary | ICD-10-CM | POA: Diagnosis not present

## 2017-05-16 DIAGNOSIS — Z720 Tobacco use: Secondary | ICD-10-CM | POA: Diagnosis not present

## 2017-05-16 DIAGNOSIS — I509 Heart failure, unspecified: Secondary | ICD-10-CM | POA: Diagnosis not present

## 2017-05-16 DIAGNOSIS — R5381 Other malaise: Secondary | ICD-10-CM | POA: Diagnosis not present

## 2017-05-16 DIAGNOSIS — J449 Chronic obstructive pulmonary disease, unspecified: Secondary | ICD-10-CM | POA: Diagnosis not present

## 2017-05-17 DIAGNOSIS — M549 Dorsalgia, unspecified: Secondary | ICD-10-CM | POA: Diagnosis not present

## 2017-05-23 DIAGNOSIS — M549 Dorsalgia, unspecified: Secondary | ICD-10-CM | POA: Diagnosis not present

## 2017-05-23 DIAGNOSIS — I509 Heart failure, unspecified: Secondary | ICD-10-CM | POA: Diagnosis not present

## 2017-05-24 ENCOUNTER — Other Ambulatory Visit: Payer: Self-pay | Admitting: *Deleted

## 2017-05-24 DIAGNOSIS — D649 Anemia, unspecified: Secondary | ICD-10-CM | POA: Diagnosis not present

## 2017-05-24 NOTE — Patient Outreach (Signed)
Advised that patient's husband, Alexandra House, called [facility] office to speak with Natividad Brood RN, [facility] Liaison. He was upset that his wife was being discharged from [facility]. Requested that Probation officer call back since covering for [place].   Returned call to [employer] [phone]. He endorses he is upset that his wife is being discharged from [facility]. He also states he has placed an appeal and is awaiting to hear back.  Discussed [facility] Care Management services. States he is interested. Also discussed that he should ensure that his wife has home health as well after SNF discharge.   Discussed Telephonic RNCM follow up as well as [facility] LCSW follow up to address ongoing concerns post SNF discharge.  Expressed appreciation of call.  Marthenia Rolling, MSN-Ed, RN,BSN [facility] Liaison [phone]

## 2017-05-27 ENCOUNTER — Encounter: Payer: Self-pay | Admitting: *Deleted

## 2017-05-27 ENCOUNTER — Other Ambulatory Visit: Payer: Self-pay | Admitting: *Deleted

## 2017-05-27 DIAGNOSIS — J9601 Acute respiratory failure with hypoxia: Secondary | ICD-10-CM | POA: Diagnosis not present

## 2017-05-27 DIAGNOSIS — M6281 Muscle weakness (generalized): Secondary | ICD-10-CM | POA: Diagnosis not present

## 2017-05-27 NOTE — Patient Outreach (Signed)
[facility]) Care Management  05/27/2017  Alexandra House 1946/06/10 [id number]   Return call received from patient's husband, Alexandra House regarding placement arrangements for patient at [facility], [place] where patient currently resides to receive short-term rehabilitative services. CSW introduced self, explained role and types of services provided through [facility] ([facility]).  CSW further explained to Alexandra House that CSW works with patient's Telephonic RNCM, also with [facility], Alexandra House. CSW then explained the reason for the call, indicating that Mrs. House thought that patient and Mr. Amerman would benefit from social work services and resources to assist with discharge planning needs and services from the skilled nursing facility.  CSW obtained two HIPAA compliant identifiers from Alexandra House, which included patient's name and date of birth. Alexandra House reported that patient has been at [facility] since July 26th, for a total of 19 days. Alexandra House went on to say that he received a denial letter from [facility] on Thursday, August 9th, reporting that patient was being discharged on Friday, August 10th, as patient's case had been reviewed by [name] and no additional inpatient days had been granted.  Alexandra House indicated that he had less than 24 hours to complete the appeals process, but was successful in getting all the paperwork completed and submitted prior to the deadline.  Alexandra House is currently awaiting a response from Alexandra House as to whether or not they have won their appeal. Alexandra House was also concerned about the fact that patient has not been seen by a physician, since residing at [facility].  Alexandra House went on to say, "How can a physician sign off on discharge paperwork when she has not even been seen"? CSW voiced understanding and provided counseling and supportive  services, where appropriate.  CSW encouraged Alexandra House to try and schedule an appointment with the Director of Nursing at [facility] to voice his concerns.  Alexandra House admitted that he was under the impression that CSW worked for [facility].  Again, CSW explained role and offered to try and advocate for patient to the best of CSW's ability.  CSW also encouraged Alexandra House to request that patient receive a pass from [facility] to meet with the physical therapist and occupational therapist in the home to address limitations and concerns. CSW inquired as to whether or not Alexandra House felt that patient was back to baseline, explaining that if patient is ambulating with a walker and able to perform activities of daily living independently, she may, in fact, be ready to discharge home.  Alexandra House admitted that he needed a few more days to make their home more handicapped accessible, by installing grab bars, moving furniture and area rugs that may present a tripping hazard and building a small ramp entering the front of their home.  Alexandra House reported, "I guess I should not have waited until the last minute, but I thought she would be there for at least one more week".  CSW explained that patient's are typically approved for 20 days of skilled care under the Medicare benefit, but that it can vary, depending upon the person and how well they are able to progress with therapies. Alexandra House reported that patient has already been set up with home health physical therapy and occupational therapy, as well as durable medical equipment Psychologist, forensic).  Alexandra House further reported that he and his two sons, Alexandra House and  Alexandra House, are all able to assist patient with bathing, meal preparation, light housekeeping duties, etc.  Mr. Fergeson admitted that his conversation with CSW was extremely helpful, as no one had explained discharge criteria to him; therefore, he felt as though patient was just being "kicked out" of the  facility.  CSW reminded Mr. Seres that he has CSW's contact information, encouraging him to contact CSW directly if additional questions and/or concerns arise in the near future.  Mr. Gorr agreed to follow-up with CSW as soon as he receives approval/denial regarding his appeals process. CSW will perform a case closure on patient, as all goals of treatment have been met from social work standpoint and no additional social work needs have been identified at this time.  CSW will notify patient's Telephonic RNCM with [facility], Alexandra House of CSW's plans to close patient's case.  CSW will fax an update to patient's Primary Care Physician, Dr. Marton House to ensure that they are aware of CSW's involvement with patient's plan of care.  CSW will submit a case closure request to Alexandra House, Care Management Assistant with [facility], in the form of an [employer].  CSW will ensure that Mrs. Comer is aware of Mrs. House's, Telephonic RNCM with [facility], continued involvement with patient's care. Alexandra House, BSW, MSW, LCSW  Licensed Education officer, environmental Health System  Mailing [place] [address] [place], [place] [address] Physical Address-[address], [place], [place] [address] Toll Free Main # [phone] Fax # [phone] Cell # [phone]  Office # [phone] [email or web address]

## 2017-05-27 NOTE — Patient Outreach (Signed)
[facility]) Care Management  05/27/2017  ANEESHA HOLLORAN 1946/05/10 [id number]   CSW made an initial attempt to try and contact patient's husband, Ardie Dragoo today to try and answer specific questions about patient's placement at [facility]; however, Mr. Fera was unavailable.  CSW left a HIPAA compliant message and is currently awaiting a return call.  CSW will make a second outreach attempt within the next few days, if CSW does not receive a return call from Mr. Kunst in the meantime. [name], BSW, MSW, LCSW  Licensed Education officer, environmental Health System  Mailing [place] [address] [place], [place] [address] Physical Address-[address], [place], [place] [address] Toll Free Main # [phone] Fax # [phone] Cell # [phone]  Office # [phone] [email or web address]

## 2017-06-04 DIAGNOSIS — J432 Centrilobular emphysema: Secondary | ICD-10-CM | POA: Diagnosis not present

## 2017-06-04 DIAGNOSIS — D649 Anemia, unspecified: Secondary | ICD-10-CM | POA: Diagnosis not present

## 2017-06-04 DIAGNOSIS — M6281 Muscle weakness (generalized): Secondary | ICD-10-CM | POA: Diagnosis not present

## 2017-06-04 DIAGNOSIS — I5042 Chronic combined systolic (congestive) and diastolic (congestive) heart failure: Secondary | ICD-10-CM | POA: Diagnosis not present

## 2017-06-04 DIAGNOSIS — R1312 Dysphagia, oropharyngeal phase: Secondary | ICD-10-CM | POA: Diagnosis not present

## 2017-06-04 DIAGNOSIS — R41841 Cognitive communication deficit: Secondary | ICD-10-CM | POA: Diagnosis not present

## 2017-06-04 DIAGNOSIS — I2510 Atherosclerotic heart disease of native coronary artery without angina pectoris: Secondary | ICD-10-CM | POA: Diagnosis not present

## 2017-06-04 DIAGNOSIS — I272 Pulmonary hypertension, unspecified: Secondary | ICD-10-CM | POA: Diagnosis not present

## 2017-06-04 DIAGNOSIS — F419 Anxiety disorder, unspecified: Secondary | ICD-10-CM | POA: Diagnosis not present

## 2017-06-06 ENCOUNTER — Other Ambulatory Visit: Payer: Self-pay

## 2017-06-06 DIAGNOSIS — R1312 Dysphagia, oropharyngeal phase: Secondary | ICD-10-CM | POA: Diagnosis not present

## 2017-06-06 DIAGNOSIS — I5042 Chronic combined systolic (congestive) and diastolic (congestive) heart failure: Secondary | ICD-10-CM | POA: Diagnosis not present

## 2017-06-06 DIAGNOSIS — R41841 Cognitive communication deficit: Secondary | ICD-10-CM | POA: Diagnosis not present

## 2017-06-06 DIAGNOSIS — I272 Pulmonary hypertension, unspecified: Secondary | ICD-10-CM | POA: Diagnosis not present

## 2017-06-06 DIAGNOSIS — I2510 Atherosclerotic heart disease of native coronary artery without angina pectoris: Secondary | ICD-10-CM | POA: Diagnosis not present

## 2017-06-06 DIAGNOSIS — M6281 Muscle weakness (generalized): Secondary | ICD-10-CM | POA: Diagnosis not present

## 2017-06-06 DIAGNOSIS — J432 Centrilobular emphysema: Secondary | ICD-10-CM | POA: Diagnosis not present

## 2017-06-06 DIAGNOSIS — D649 Anemia, unspecified: Secondary | ICD-10-CM | POA: Diagnosis not present

## 2017-06-06 DIAGNOSIS — F419 Anxiety disorder, unspecified: Secondary | ICD-10-CM | POA: Diagnosis not present

## 2017-06-06 NOTE — Patient Outreach (Signed)
Transition of care: Notified by [facility] social worker that patient was discharged. Unknown date. [facility] social worker has been trying to reach patient and has also been unsuccessful.  I placed call to home number which was disconnect.  Placed call to husband's cell phone with no answer.  PLAN: will continue to outreach patient.  Tomasa Rand, RN, BSN, CEN [facility] [employer] [phone]

## 2017-06-07 ENCOUNTER — Other Ambulatory Visit: Payer: Self-pay

## 2017-06-07 NOTE — Patient Outreach (Signed)
Transition of care: 2nd attempt to reach patient for transition of care unsuccessful.  PLAN: Spoke with [facility] social worker Deitra Mayo who is also unable to contact patient or husband.  Will continue to attempt outreach.  Tomasa Rand, RN, BSN, CEN [facility] [employer] [phone]

## 2017-06-11 ENCOUNTER — Other Ambulatory Visit: Payer: Self-pay

## 2017-06-11 DIAGNOSIS — J432 Centrilobular emphysema: Secondary | ICD-10-CM | POA: Diagnosis not present

## 2017-06-11 DIAGNOSIS — I2510 Atherosclerotic heart disease of native coronary artery without angina pectoris: Secondary | ICD-10-CM | POA: Diagnosis not present

## 2017-06-11 DIAGNOSIS — M6281 Muscle weakness (generalized): Secondary | ICD-10-CM | POA: Diagnosis not present

## 2017-06-11 DIAGNOSIS — I272 Pulmonary hypertension, unspecified: Secondary | ICD-10-CM | POA: Diagnosis not present

## 2017-06-11 DIAGNOSIS — R41841 Cognitive communication deficit: Secondary | ICD-10-CM | POA: Diagnosis not present

## 2017-06-11 DIAGNOSIS — I5042 Chronic combined systolic (congestive) and diastolic (congestive) heart failure: Secondary | ICD-10-CM | POA: Diagnosis not present

## 2017-06-11 DIAGNOSIS — F419 Anxiety disorder, unspecified: Secondary | ICD-10-CM | POA: Diagnosis not present

## 2017-06-11 DIAGNOSIS — R1312 Dysphagia, oropharyngeal phase: Secondary | ICD-10-CM | POA: Diagnosis not present

## 2017-06-11 DIAGNOSIS — D649 Anemia, unspecified: Secondary | ICD-10-CM | POA: Diagnosis not present

## 2017-06-11 NOTE — Patient Outreach (Signed)
Transition of care:   Attempted to reach patient. Unsuccessful.  Unidentified voice mail. No message left.  PLAN: will Corporate treasurer. If no response in 10 days will plan to close as unable to reach.  Tomasa Rand, RN, BSN, CEN [facility] [employer] [phone]

## 2017-06-12 DIAGNOSIS — R1312 Dysphagia, oropharyngeal phase: Secondary | ICD-10-CM | POA: Diagnosis not present

## 2017-06-12 DIAGNOSIS — D649 Anemia, unspecified: Secondary | ICD-10-CM | POA: Diagnosis not present

## 2017-06-12 DIAGNOSIS — J432 Centrilobular emphysema: Secondary | ICD-10-CM | POA: Diagnosis not present

## 2017-06-12 DIAGNOSIS — R41841 Cognitive communication deficit: Secondary | ICD-10-CM | POA: Diagnosis not present

## 2017-06-12 DIAGNOSIS — I5042 Chronic combined systolic (congestive) and diastolic (congestive) heart failure: Secondary | ICD-10-CM | POA: Diagnosis not present

## 2017-06-12 DIAGNOSIS — M6281 Muscle weakness (generalized): Secondary | ICD-10-CM | POA: Diagnosis not present

## 2017-06-12 DIAGNOSIS — I272 Pulmonary hypertension, unspecified: Secondary | ICD-10-CM | POA: Diagnosis not present

## 2017-06-12 DIAGNOSIS — I2510 Atherosclerotic heart disease of native coronary artery without angina pectoris: Secondary | ICD-10-CM | POA: Diagnosis not present

## 2017-06-12 DIAGNOSIS — F419 Anxiety disorder, unspecified: Secondary | ICD-10-CM | POA: Diagnosis not present

## 2017-06-13 DIAGNOSIS — I2510 Atherosclerotic heart disease of native coronary artery without angina pectoris: Secondary | ICD-10-CM | POA: Diagnosis not present

## 2017-06-13 DIAGNOSIS — I272 Pulmonary hypertension, unspecified: Secondary | ICD-10-CM | POA: Diagnosis not present

## 2017-06-13 DIAGNOSIS — D649 Anemia, unspecified: Secondary | ICD-10-CM | POA: Diagnosis not present

## 2017-06-13 DIAGNOSIS — M6281 Muscle weakness (generalized): Secondary | ICD-10-CM | POA: Diagnosis not present

## 2017-06-13 DIAGNOSIS — J432 Centrilobular emphysema: Secondary | ICD-10-CM | POA: Diagnosis not present

## 2017-06-13 DIAGNOSIS — R41841 Cognitive communication deficit: Secondary | ICD-10-CM | POA: Diagnosis not present

## 2017-06-13 DIAGNOSIS — F419 Anxiety disorder, unspecified: Secondary | ICD-10-CM | POA: Diagnosis not present

## 2017-06-13 DIAGNOSIS — I5042 Chronic combined systolic (congestive) and diastolic (congestive) heart failure: Secondary | ICD-10-CM | POA: Diagnosis not present

## 2017-06-13 DIAGNOSIS — R1312 Dysphagia, oropharyngeal phase: Secondary | ICD-10-CM | POA: Diagnosis not present

## 2017-06-14 DIAGNOSIS — F419 Anxiety disorder, unspecified: Secondary | ICD-10-CM | POA: Diagnosis not present

## 2017-06-14 DIAGNOSIS — I2510 Atherosclerotic heart disease of native coronary artery without angina pectoris: Secondary | ICD-10-CM | POA: Diagnosis not present

## 2017-06-14 DIAGNOSIS — D649 Anemia, unspecified: Secondary | ICD-10-CM | POA: Diagnosis not present

## 2017-06-14 DIAGNOSIS — R41841 Cognitive communication deficit: Secondary | ICD-10-CM | POA: Diagnosis not present

## 2017-06-14 DIAGNOSIS — I272 Pulmonary hypertension, unspecified: Secondary | ICD-10-CM | POA: Diagnosis not present

## 2017-06-14 DIAGNOSIS — I5042 Chronic combined systolic (congestive) and diastolic (congestive) heart failure: Secondary | ICD-10-CM | POA: Diagnosis not present

## 2017-06-14 DIAGNOSIS — J432 Centrilobular emphysema: Secondary | ICD-10-CM | POA: Diagnosis not present

## 2017-06-14 DIAGNOSIS — R1312 Dysphagia, oropharyngeal phase: Secondary | ICD-10-CM | POA: Diagnosis not present

## 2017-06-14 DIAGNOSIS — M6281 Muscle weakness (generalized): Secondary | ICD-10-CM | POA: Diagnosis not present

## 2017-06-18 DIAGNOSIS — I959 Hypotension, unspecified: Secondary | ICD-10-CM | POA: Diagnosis not present

## 2017-06-18 DIAGNOSIS — E46 Unspecified protein-calorie malnutrition: Secondary | ICD-10-CM | POA: Diagnosis not present

## 2017-06-18 DIAGNOSIS — J9620 Acute and chronic respiratory failure, unspecified whether with hypoxia or hypercapnia: Secondary | ICD-10-CM | POA: Diagnosis not present

## 2017-06-18 DIAGNOSIS — Z681 Body mass index (BMI) 19 or less, adult: Secondary | ICD-10-CM | POA: Diagnosis not present

## 2017-06-18 DIAGNOSIS — I5020 Unspecified systolic (congestive) heart failure: Secondary | ICD-10-CM | POA: Diagnosis not present

## 2017-06-18 DIAGNOSIS — R946 Abnormal results of thyroid function studies: Secondary | ICD-10-CM | POA: Diagnosis not present

## 2017-06-19 ENCOUNTER — Telehealth: Payer: Self-pay | Admitting: Physician Assistant

## 2017-06-19 DIAGNOSIS — R1312 Dysphagia, oropharyngeal phase: Secondary | ICD-10-CM | POA: Diagnosis not present

## 2017-06-19 DIAGNOSIS — J432 Centrilobular emphysema: Secondary | ICD-10-CM | POA: Diagnosis not present

## 2017-06-19 DIAGNOSIS — I2510 Atherosclerotic heart disease of native coronary artery without angina pectoris: Secondary | ICD-10-CM | POA: Diagnosis not present

## 2017-06-19 DIAGNOSIS — F419 Anxiety disorder, unspecified: Secondary | ICD-10-CM | POA: Diagnosis not present

## 2017-06-19 DIAGNOSIS — I272 Pulmonary hypertension, unspecified: Secondary | ICD-10-CM | POA: Diagnosis not present

## 2017-06-19 DIAGNOSIS — R41841 Cognitive communication deficit: Secondary | ICD-10-CM | POA: Diagnosis not present

## 2017-06-19 DIAGNOSIS — M6281 Muscle weakness (generalized): Secondary | ICD-10-CM | POA: Diagnosis not present

## 2017-06-19 DIAGNOSIS — D649 Anemia, unspecified: Secondary | ICD-10-CM | POA: Diagnosis not present

## 2017-06-19 DIAGNOSIS — I5042 Chronic combined systolic (congestive) and diastolic (congestive) heart failure: Secondary | ICD-10-CM | POA: Diagnosis not present

## 2017-06-19 NOTE — Telephone Encounter (Signed)
Received records from [facility] for appointment on 07/02/17 with Almyra Deforest, PA.  Records put with Hao's schedule for 07/02/17. lp

## 2017-06-20 DIAGNOSIS — D649 Anemia, unspecified: Secondary | ICD-10-CM | POA: Diagnosis not present

## 2017-06-20 DIAGNOSIS — F419 Anxiety disorder, unspecified: Secondary | ICD-10-CM | POA: Diagnosis not present

## 2017-06-20 DIAGNOSIS — R41841 Cognitive communication deficit: Secondary | ICD-10-CM | POA: Diagnosis not present

## 2017-06-20 DIAGNOSIS — I5042 Chronic combined systolic (congestive) and diastolic (congestive) heart failure: Secondary | ICD-10-CM | POA: Diagnosis not present

## 2017-06-20 DIAGNOSIS — R1312 Dysphagia, oropharyngeal phase: Secondary | ICD-10-CM | POA: Diagnosis not present

## 2017-06-20 DIAGNOSIS — M6281 Muscle weakness (generalized): Secondary | ICD-10-CM | POA: Diagnosis not present

## 2017-06-20 DIAGNOSIS — I2510 Atherosclerotic heart disease of native coronary artery without angina pectoris: Secondary | ICD-10-CM | POA: Diagnosis not present

## 2017-06-20 DIAGNOSIS — J432 Centrilobular emphysema: Secondary | ICD-10-CM | POA: Diagnosis not present

## 2017-06-20 DIAGNOSIS — I272 Pulmonary hypertension, unspecified: Secondary | ICD-10-CM | POA: Diagnosis not present

## 2017-06-21 DIAGNOSIS — I5042 Chronic combined systolic (congestive) and diastolic (congestive) heart failure: Secondary | ICD-10-CM | POA: Diagnosis not present

## 2017-06-21 DIAGNOSIS — M6281 Muscle weakness (generalized): Secondary | ICD-10-CM | POA: Diagnosis not present

## 2017-06-21 DIAGNOSIS — R1312 Dysphagia, oropharyngeal phase: Secondary | ICD-10-CM | POA: Diagnosis not present

## 2017-06-21 DIAGNOSIS — R41841 Cognitive communication deficit: Secondary | ICD-10-CM | POA: Diagnosis not present

## 2017-06-21 DIAGNOSIS — J432 Centrilobular emphysema: Secondary | ICD-10-CM | POA: Diagnosis not present

## 2017-06-21 DIAGNOSIS — F419 Anxiety disorder, unspecified: Secondary | ICD-10-CM | POA: Diagnosis not present

## 2017-06-21 DIAGNOSIS — D649 Anemia, unspecified: Secondary | ICD-10-CM | POA: Diagnosis not present

## 2017-06-21 DIAGNOSIS — I2510 Atherosclerotic heart disease of native coronary artery without angina pectoris: Secondary | ICD-10-CM | POA: Diagnosis not present

## 2017-06-21 DIAGNOSIS — I272 Pulmonary hypertension, unspecified: Secondary | ICD-10-CM | POA: Diagnosis not present

## 2017-06-24 ENCOUNTER — Other Ambulatory Visit: Payer: Self-pay

## 2017-06-24 NOTE — Patient Outreach (Signed)
CASE CLOSED: Unable to reach patient via phone. No response to outreach letter.  PLAN: close case  Tomasa Rand, RN, BSN, CEN [facility] [employer] [phone]

## 2017-06-25 DIAGNOSIS — R41841 Cognitive communication deficit: Secondary | ICD-10-CM | POA: Diagnosis not present

## 2017-06-25 DIAGNOSIS — M6281 Muscle weakness (generalized): Secondary | ICD-10-CM | POA: Diagnosis not present

## 2017-06-25 DIAGNOSIS — F419 Anxiety disorder, unspecified: Secondary | ICD-10-CM | POA: Diagnosis not present

## 2017-06-25 DIAGNOSIS — J432 Centrilobular emphysema: Secondary | ICD-10-CM | POA: Diagnosis not present

## 2017-06-25 DIAGNOSIS — I2510 Atherosclerotic heart disease of native coronary artery without angina pectoris: Secondary | ICD-10-CM | POA: Diagnosis not present

## 2017-06-25 DIAGNOSIS — R1312 Dysphagia, oropharyngeal phase: Secondary | ICD-10-CM | POA: Diagnosis not present

## 2017-06-25 DIAGNOSIS — D649 Anemia, unspecified: Secondary | ICD-10-CM | POA: Diagnosis not present

## 2017-06-25 DIAGNOSIS — I272 Pulmonary hypertension, unspecified: Secondary | ICD-10-CM | POA: Diagnosis not present

## 2017-06-25 DIAGNOSIS — I5042 Chronic combined systolic (congestive) and diastolic (congestive) heart failure: Secondary | ICD-10-CM | POA: Diagnosis not present

## 2017-06-27 DIAGNOSIS — R1312 Dysphagia, oropharyngeal phase: Secondary | ICD-10-CM | POA: Diagnosis not present

## 2017-06-27 DIAGNOSIS — I272 Pulmonary hypertension, unspecified: Secondary | ICD-10-CM | POA: Diagnosis not present

## 2017-06-27 DIAGNOSIS — D649 Anemia, unspecified: Secondary | ICD-10-CM | POA: Diagnosis not present

## 2017-06-27 DIAGNOSIS — M6281 Muscle weakness (generalized): Secondary | ICD-10-CM | POA: Diagnosis not present

## 2017-06-27 DIAGNOSIS — I2510 Atherosclerotic heart disease of native coronary artery without angina pectoris: Secondary | ICD-10-CM | POA: Diagnosis not present

## 2017-06-27 DIAGNOSIS — J432 Centrilobular emphysema: Secondary | ICD-10-CM | POA: Diagnosis not present

## 2017-06-27 DIAGNOSIS — F419 Anxiety disorder, unspecified: Secondary | ICD-10-CM | POA: Diagnosis not present

## 2017-06-27 DIAGNOSIS — I5042 Chronic combined systolic (congestive) and diastolic (congestive) heart failure: Secondary | ICD-10-CM | POA: Diagnosis not present

## 2017-06-27 DIAGNOSIS — R41841 Cognitive communication deficit: Secondary | ICD-10-CM | POA: Diagnosis not present

## 2017-06-30 NOTE — Progress Notes (Signed)
Cardiology Office Note    Date:  07/04/2017   ID:  Alexandra House 08-May-1946, MRN [id number]  PCP:  Marton Redwood, MD  Cardiologist:  Dr. Stanford Breed  Chief Complaint  Patient presents with  . Follow-up    seen for Dr. Stanford Breed    History of Present Illness:  Alexandra House is a 71 y.o. female with PMH of COPD, HLD, syncope, depression, mild carotid stenosis. Myoview in November 2013 demonstrating no ischemia, EF 81%, she had a low level exercise Lexiscan and the did develop significant ST abnormality consistent with ischemia and a transient complete heart block with infusion. She has been treated medically with low threshold for cath if recurrent symptom. Carotid ultrasound in April 2014 showed moderate disease bilaterally. She was last seen by Dr. Stanford Breed on 04/06/2013. She was admitted in July 2018 with weakness. While in the ED, she was found to be bradycardic and hypotensive. Both lactic acid and creatinine were elevated. White blood cell count 27.7. She was intubated for possible septic shock. Blood pressure responded to hydration. Cardiology was consulted as her troponin was 2.05. Echocardiogram obtained on 04/21/2017 showed EF 45%, hypokinesis of the basal mid anteroseptal, basal mid inferoseptal, basal mid inferior and apical myocardium, mildly reduced RV EF, moderate MR. Repeat limited echo obtained on 04/27/2017 showed EF 45-50%, persistent wall motion abnormality, moderate MR, PA peak pressure 38 mmHg. During this admission, her AV nodal blocking agent were discontinued due to bradycardic down to the 30s.  She presents today for cardiology office visit along with her husband. Since discharge, she went to [facility] for [facility], since then, she has been released home. She has had significant weight loss recently, her weight today is a measly 81 pounds. Family has been trying to give her Ensure protein shake. She has since been seen by her primary care provider who asked her  to follow-up with cardiology service. She'll occasionally smoke a puff according to the husband, I recommended tobacco cessation. Both her husband and patient are willing to quit at this point. From cardiology perspective, she has been doing very well. She says even with physical therapy, she did not have any significant chest tightness unless overexerting herself. Today's EKG does show T wave inversion in inferior leads. She is not a candidate for any chemical stress test as any rate control agent including beta blocker, adenosine or Lexiscan potentially push her into third degree heart block. She cannot keep up with the treadmill. She is not a candidate for coronary CT either as she cannot take IV Lopressor to get her heart rate down. The only option left would be either medical therapy versus cardiac catheterization. She wished to proceed with monitoring for now. Her blood pressure is too soft for me to add any Imdur. I discussed the case with Dr. Oval Linsey who also agrees without significant symptom, we can continue observation at this time. Hopefully, if she can increase her weight and improve her malnutrition status, she'll continue to recover. She is aware that if she does have any significant chest discomfort, she will need to seek urgent medical attention.   Past Medical History:  Diagnosis Date  . Acute on chronic respiratory failure ([place])   . Anxiety   . Caloric malnutrition ([place])   . Carotid stenosis   . Coronary artery disease   . Fibromyalgia   . HLD (hyperlipidemia)   . Hx of cardiovascular stress test    a. Lex MV 11/13: + significant ECG changes  and CHB with infusion; EF 81%, no ischemia,   . Hx of echocardiogram    a. Echo 12/12: EF 53-61%, grade 2 diastolic dysfunction, mild MR.; b. Echo 11/13:  mild LVH, EF 60-65%  . Hypertension   . Neuropathy   . Osteoporosis   . Syncope 08/2009   a. ETT-Echo 1/11:  poor ex tol, submax exercise, no WMA or ECG changes c/w ischemia;       Past Surgical History:  Procedure Laterality Date  . None      Current Medications: Outpatient Medications Prior to Visit  Medication Sig Dispense Refill  . aspirin EC 81 MG tablet Take 81 mg by mouth daily.    . benzonatate (TESSALON) 100 MG capsule Take 100 mg by mouth every 8 (eight) hours as needed for cough.  0  . BREO ELLIPTA 100-25 MCG/INH AEPB Inhale 1 puff into the lungs daily.  1  . calcium carbonate (OS-CAL) 600 MG TABS Take 600 mg by mouth 2 (two) times daily with a meal.    . denosumab (PROLIA) 60 MG/ML SOLN injection Inject 60 mg into the skin every 6 (six) months. Administer in upper arm, thigh, or abdomen    . ENSURE PLUS (ENSURE PLUS) LIQD Take 237 mLs by mouth daily.    Marland Kitchen EPINEPHrine (EPIPEN JR) 0.15 MG/0.3ML injection Inject 15 mg into the muscle as needed.  0  . ergocalciferol (VITAMIN D2) 50000 units capsule Take 50,000 Units by mouth once a week.    Marland Kitchen guaiFENesin (MUCINEX) 600 MG 12 hr tablet Take by mouth as needed.    Marland Kitchen ibuprofen (ADVIL,MOTRIN) 200 MG tablet Take 200-400 mg by mouth every 6 (six) hours as needed for mild pain.     Marland Kitchen LORazepam (ATIVAN) 1 MG tablet Take 1 mg by mouth every 8 (eight) hours.    . Multiple Vitamins-Minerals (MULTIVITAMIN GUMMIES ADULT PO) Take 2 tablets by mouth daily.    Marland Kitchen albuterol (PROVENTIL HFA;VENTOLIN HFA) 108 (90 BASE) MCG/ACT inhaler Inhale 2 puffs into the lungs every 6 (six) hours as needed for wheezing or shortness of breath.     Marland Kitchen atorvastatin (LIPITOR) 20 MG tablet Take 20 mg by mouth daily.    . clotrimazole (MYCELEX) 10 MG troche Take 1 tablet (10 mg total) by mouth 5 (five) times daily. 10 tablet 0  . omeprazole (PRILOSEC) 40 MG capsule Take 40 mg by mouth daily.  0  . ondansetron (ZOFRAN) 4 MG tablet Take 1 tablet (4 mg total) by mouth every 8 (eight) hours as needed for nausea or vomiting. 12 tablet 0  . ranitidine (ZANTAC) 150 MG capsule Take 1 capsule (150 mg total) by mouth daily. 30 capsule 0   No  facility-administered medications prior to visit.      Allergies:   Bee venom   Social History   Social History  . Marital status: Married    Spouse name: N/A  . Number of children: 2  . Years of education: N/A   Social History Main Topics  . Smoking status: Current Every Day Smoker    Packs/day: 0.50    Types: Cigarettes  . Smokeless tobacco: Current User     Comment: vapor cig.  Marland Kitchen Alcohol use Yes     Comment: glass wine once every 6 mos. per patient  . Drug use: No  . Sexual activity: Not Asked   Other Topics Concern  . None   Social History Narrative   Lives with husband  Family History:  The patient's family history includes Breast cancer in her mother; CAD (age of onset: 67) in her mother; Lung cancer in her father.   ROS:   Please see the history of present illness.    ROS All other systems reviewed and are negative.   PHYSICAL EXAM:   VS:  BP 104/62   Pulse 85   Ht 4\' 11"  (1.499 m)   Wt 81 lb (36.7 kg)   LMP  (LMP Unknown)   BMI 16.36 kg/m    GEN: Thin and cachectic.  in no acute distress  HEENT: normal  Neck: no JVD, carotid bruits, or masses Cardiac: RRR; no murmurs, rubs, or gallops,no edema  Respiratory:  clear to auscultation bilaterally, normal work of breathing GI: soft, nontender, nondistended, + BS MS: no deformity or atrophy  Skin: warm and dry, no rash Neuro:  Alert and Oriented x 3, Strength and sensation are intact Psych: euthymic mood, full affect  Wt Readings from Last 3 Encounters:  07/02/17 81 lb (36.7 kg)  05/09/17 95 lb 1.6 oz (43.1 kg)  04/14/17 82 lb 11.2 oz (37.5 kg)      Studies/Labs Reviewed:   EKG:  EKG is ordered today.  The ekg ordered today demonstrates Normal sinus rhythm, T-wave inversion in the inferior leads.  Recent Labs: 04/20/2017: TSH 4.801 04/21/2017: B Natriuretic Peptide 1,528.1 05/07/2017: ALT 54; Hemoglobin 9.0; Platelets 171 05/08/2017: BUN 7; Creatinine, Ser 0.71; Magnesium 2.0; Potassium 4.1;  Sodium 138   Lipid Panel No results found for: CHOL, TRIG, HDL, CHOLHDL, VLDL, LDLCALC, LDLDIRECT  Additional studies/ records that were reviewed today include:   Myoview 04/21/2017 LV EF: 45%  Study Conclusions  - Left ventricle: The cavity size was normal. Wall thickness was   normal. Systolic function was mildly reduced. The estimated   ejection fraction was 45%. - Regional wall motion abnormality: Hypokinesis of the basal-mid   anteroseptal, basal-mid inferoseptal, basal-mid inferior, and   apical septal myocardium. - Mitral valve: There was moderate regurgitation. - Right ventricle: Systolic function was mildly reduced. - Tricuspid valve: There was mild regurgitation.    Echo 04/27/2017 LV EF: 45% -   50%  Study Conclusions  - Left ventricle: The cavity size was normal. Systolic function was   mildly reduced. The estimated ejection fraction was in the range   of 45% to 50%. There is akinesis of the basal-midinferoseptal   myocardium. There is akinesis of the basal-midinferior   myocardium. The study is not technically sufficient to allow   evaluation of LV diastolic function. - Aortic valve: Trileaflet; mildly thickened, mildly calcified   leaflets. - Mitral valve: Mild diffuse thickening of the anterior leaflet.   There was moderate regurgitation directed eccentrically and   toward the free wall. - Pulmonary arteries: PA peak pressure: 38 mm Hg (S).   ASSESSMENT:    1. Elevated troponin   2. Pure hypercholesterolemia   3. Abnormal echocardiogram   4. Stenosis of carotid artery, unspecified laterality   5. Protein malnutrition ([place])   6. Tobacco abuse      PLAN:  In order of problems listed above:  1. Elevated troponin: recent elevation of troponin during the admission was felt to be due to demand ischemia in the setting of sepsis.  2. Abnormal echocardiogram: Echo showed mildly low EF with wall motion abnormalities. She likely have some degree of  underlying coronary artery disease, however she cannot do any stress testing as she tend go into third degree heart  block with rate control medication or adenosine. We discussed either medical therapy versus cardiac catheterization, she wished to pursue observation for now. Her blood pressure is too soft for me to add Imdur.  3. Protein malnutrition: She is very thin and cachectic, recently she has lost significant amount of weight. She is only 81 pounds. She is drinking Ensure everyday  4. Carotid artery stenosis: Last carotid Doppler 2015, only mild disease bilaterally.  5. Tobacco abuse: She continued to smoke occasionally, both her husband and her are willing to quit at this point.    Medication Adjustments/Labs and Tests Ordered: Current medicines are reviewed at length with the patient today.  Concerns regarding medicines are outlined above.  Medication changes, Labs and Tests ordered today are listed in the Patient Instructions below. Patient Instructions  Medication Instructions:   START Lipitor 20mg  DAILY  Labwork:   Return to our office for fasting labwork in 2 months (cholesterol, liver function tests). We have a [place] on-site. You do not need an appointment. Our lab hours are from 8:30am to 4:30pm with a lunch closure from 12:45pm to 1:45pm.  Testing/Procedures:  none  Follow-Up:  Return in 2 months to see Dr. Stanford Breed   If you need a refill on your cardiac medications before your next appointment, please call your pharmacy.      Hilbert Corrigan, [place]  07/04/2017 12:37 AM    [address], [place], [place]  [address] Phone: [phone]; Fax: [phone]

## 2017-07-02 ENCOUNTER — Encounter: Payer: Self-pay | Admitting: Physician Assistant

## 2017-07-02 ENCOUNTER — Ambulatory Visit (INDEPENDENT_AMBULATORY_CARE_PROVIDER_SITE_OTHER): Payer: Medicare HMO | Admitting: Physician Assistant

## 2017-07-02 VITALS — BP 104/62 | HR 85 | Ht 59.0 in | Wt 81.0 lb

## 2017-07-02 DIAGNOSIS — D649 Anemia, unspecified: Secondary | ICD-10-CM | POA: Diagnosis not present

## 2017-07-02 DIAGNOSIS — E7800 Pure hypercholesterolemia, unspecified: Secondary | ICD-10-CM

## 2017-07-02 DIAGNOSIS — R41841 Cognitive communication deficit: Secondary | ICD-10-CM | POA: Diagnosis not present

## 2017-07-02 DIAGNOSIS — E46 Unspecified protein-calorie malnutrition: Secondary | ICD-10-CM

## 2017-07-02 DIAGNOSIS — I6529 Occlusion and stenosis of unspecified carotid artery: Secondary | ICD-10-CM

## 2017-07-02 DIAGNOSIS — R931 Abnormal findings on diagnostic imaging of heart and coronary circulation: Secondary | ICD-10-CM | POA: Diagnosis not present

## 2017-07-02 DIAGNOSIS — F419 Anxiety disorder, unspecified: Secondary | ICD-10-CM | POA: Diagnosis not present

## 2017-07-02 DIAGNOSIS — R7989 Other specified abnormal findings of blood chemistry: Principal | ICD-10-CM

## 2017-07-02 DIAGNOSIS — R778 Other specified abnormalities of plasma proteins: Secondary | ICD-10-CM

## 2017-07-02 DIAGNOSIS — R748 Abnormal levels of other serum enzymes: Secondary | ICD-10-CM

## 2017-07-02 DIAGNOSIS — J432 Centrilobular emphysema: Secondary | ICD-10-CM | POA: Diagnosis not present

## 2017-07-02 DIAGNOSIS — R1312 Dysphagia, oropharyngeal phase: Secondary | ICD-10-CM | POA: Diagnosis not present

## 2017-07-02 DIAGNOSIS — Z720 Tobacco use: Secondary | ICD-10-CM

## 2017-07-02 DIAGNOSIS — I272 Pulmonary hypertension, unspecified: Secondary | ICD-10-CM | POA: Diagnosis not present

## 2017-07-02 DIAGNOSIS — I2510 Atherosclerotic heart disease of native coronary artery without angina pectoris: Secondary | ICD-10-CM | POA: Diagnosis not present

## 2017-07-02 DIAGNOSIS — M6281 Muscle weakness (generalized): Secondary | ICD-10-CM | POA: Diagnosis not present

## 2017-07-02 DIAGNOSIS — I5042 Chronic combined systolic (congestive) and diastolic (congestive) heart failure: Secondary | ICD-10-CM | POA: Diagnosis not present

## 2017-07-02 MED ORDER — ATORVASTATIN CALCIUM 20 MG PO TABS: 20.0000 mg | ORAL_TABLET | Freq: Every day | ORAL | 5 refills | Status: DC

## 2017-07-02 NOTE — Patient Instructions (Signed)
Medication Instructions:   START Lipitor 20mg  DAILY  Labwork:   Return to our office for fasting labwork in 2 months (cholesterol, liver function tests). We have a [place] on-site. You do not need an appointment. Our lab hours are from 8:30am to 4:30pm with a lunch closure from 12:45pm to 1:45pm.  Testing/Procedures:  none  Follow-Up:  Return in 2 months to see Dr. Stanford Breed   If you need a refill on your cardiac medications before your next appointment, please call your pharmacy.

## 2017-07-04 ENCOUNTER — Encounter: Payer: Self-pay | Admitting: Physician Assistant

## 2017-07-04 DIAGNOSIS — R1312 Dysphagia, oropharyngeal phase: Secondary | ICD-10-CM | POA: Diagnosis not present

## 2017-07-04 DIAGNOSIS — D649 Anemia, unspecified: Secondary | ICD-10-CM | POA: Diagnosis not present

## 2017-07-04 DIAGNOSIS — I272 Pulmonary hypertension, unspecified: Secondary | ICD-10-CM | POA: Diagnosis not present

## 2017-07-04 DIAGNOSIS — M6281 Muscle weakness (generalized): Secondary | ICD-10-CM | POA: Diagnosis not present

## 2017-07-04 DIAGNOSIS — I2510 Atherosclerotic heart disease of native coronary artery without angina pectoris: Secondary | ICD-10-CM | POA: Diagnosis not present

## 2017-07-04 DIAGNOSIS — F419 Anxiety disorder, unspecified: Secondary | ICD-10-CM | POA: Diagnosis not present

## 2017-07-04 DIAGNOSIS — J432 Centrilobular emphysema: Secondary | ICD-10-CM | POA: Diagnosis not present

## 2017-07-04 DIAGNOSIS — I5042 Chronic combined systolic (congestive) and diastolic (congestive) heart failure: Secondary | ICD-10-CM | POA: Diagnosis not present

## 2017-07-04 DIAGNOSIS — R41841 Cognitive communication deficit: Secondary | ICD-10-CM | POA: Diagnosis not present

## 2017-07-10 DIAGNOSIS — Z803 Family history of malignant neoplasm of breast: Secondary | ICD-10-CM | POA: Diagnosis not present

## 2017-07-10 DIAGNOSIS — Z1231 Encounter for screening mammogram for malignant neoplasm of breast: Secondary | ICD-10-CM | POA: Diagnosis not present

## 2017-07-19 DIAGNOSIS — D649 Anemia, unspecified: Secondary | ICD-10-CM | POA: Diagnosis not present

## 2017-07-19 DIAGNOSIS — I2510 Atherosclerotic heart disease of native coronary artery without angina pectoris: Secondary | ICD-10-CM | POA: Diagnosis not present

## 2017-07-19 DIAGNOSIS — J432 Centrilobular emphysema: Secondary | ICD-10-CM | POA: Diagnosis not present

## 2017-07-19 DIAGNOSIS — F419 Anxiety disorder, unspecified: Secondary | ICD-10-CM | POA: Diagnosis not present

## 2017-07-19 DIAGNOSIS — R41841 Cognitive communication deficit: Secondary | ICD-10-CM | POA: Diagnosis not present

## 2017-07-19 DIAGNOSIS — M6281 Muscle weakness (generalized): Secondary | ICD-10-CM | POA: Diagnosis not present

## 2017-07-19 DIAGNOSIS — I272 Pulmonary hypertension, unspecified: Secondary | ICD-10-CM | POA: Diagnosis not present

## 2017-07-19 DIAGNOSIS — I5042 Chronic combined systolic (congestive) and diastolic (congestive) heart failure: Secondary | ICD-10-CM | POA: Diagnosis not present

## 2017-07-19 DIAGNOSIS — R1312 Dysphagia, oropharyngeal phase: Secondary | ICD-10-CM | POA: Diagnosis not present

## 2017-08-13 DIAGNOSIS — J9620 Acute and chronic respiratory failure, unspecified whether with hypoxia or hypercapnia: Secondary | ICD-10-CM | POA: Diagnosis not present

## 2017-08-13 DIAGNOSIS — Z681 Body mass index (BMI) 19 or less, adult: Secondary | ICD-10-CM | POA: Diagnosis not present

## 2017-08-13 DIAGNOSIS — I9589 Other hypotension: Secondary | ICD-10-CM | POA: Diagnosis not present

## 2017-08-13 DIAGNOSIS — R05 Cough: Secondary | ICD-10-CM | POA: Diagnosis not present

## 2017-08-13 DIAGNOSIS — I5020 Unspecified systolic (congestive) heart failure: Secondary | ICD-10-CM | POA: Diagnosis not present

## 2017-08-13 DIAGNOSIS — E46 Unspecified protein-calorie malnutrition: Secondary | ICD-10-CM | POA: Diagnosis not present

## 2017-09-19 DIAGNOSIS — M810 Age-related osteoporosis without current pathological fracture: Secondary | ICD-10-CM | POA: Diagnosis not present

## 2017-10-01 NOTE — Progress Notes (Deleted)
HPI: Echo 11/13 demonstrated mild LVH and normal LV function. Myoview in Nov 2013 demonstrated no ischemia and an EF of 81%. She had a low-level exercise Lexiscan study and did develop significant ST abnormalities consistent with ischemia and transient complete heart block with infusion.  Carotid Dopplers September 2015 showed less than 40% bilateral stenosis.  She was admitted in July 2018 with weakness. While in the ED, she was found to be bradycardic and hypotensive. Both lactic acid and creatinine were elevated. White blood cell count 27.7. She was intubated for possible septic shock. Blood pressure responded to hydration. Cardiology was consulted as her troponin was 2.05. During this admission, her AV nodal blocking agent were discontinued due to bradycardic down to the 30s.  Last echocardiogram July 2018 showed ejection fraction 45-50% with akinesis of the basal inferoseptal and mid inferior walls.  There was moderate mitral regurgitation. Treated medically as felt to be demand ischemia. Cath was discussed with pt but she preferred medical therapy. Since she was last seen  Current Outpatient Medications  Medication Sig Dispense Refill  . aspirin EC 81 MG tablet Take 81 mg by mouth daily.    Marland Kitchen atorvastatin (LIPITOR) 20 MG tablet Take 1 tablet (20 mg total) by mouth daily. 30 tablet 5  . benzonatate (TESSALON) 100 MG capsule Take 100 mg by mouth every 8 (eight) hours as needed for cough.  0  . BREO ELLIPTA 100-25 MCG/INH AEPB Inhale 1 puff into the lungs daily.  1  . calcium carbonate (OS-CAL) 600 MG TABS Take 600 mg by mouth 2 (two) times daily with a meal.    . denosumab (PROLIA) 60 MG/ML SOLN injection Inject 60 mg into the skin every 6 (six) months. Administer in upper arm, thigh, or abdomen    . ENSURE PLUS (ENSURE PLUS) LIQD Take 237 mLs by mouth daily.    Marland Kitchen EPINEPHrine (EPIPEN JR) 0.15 MG/0.3ML injection Inject 15 mg into the muscle as needed.  0  . ergocalciferol (VITAMIN D2) 50000  units capsule Take 50,000 Units by mouth once a week.    Marland Kitchen guaiFENesin (MUCINEX) 600 MG 12 hr tablet Take by mouth as needed.    Marland Kitchen ibuprofen (ADVIL,MOTRIN) 200 MG tablet Take 200-400 mg by mouth every 6 (six) hours as needed for mild pain.     Marland Kitchen LORazepam (ATIVAN) 1 MG tablet Take 1 mg by mouth every 8 (eight) hours.    . Multiple Vitamins-Minerals (MULTIVITAMIN GUMMIES ADULT PO) Take 2 tablets by mouth daily.     No current facility-administered medications for this visit.      Past Medical History:  Diagnosis Date  . Acute on chronic respiratory failure ([place])   . Anxiety   . Caloric malnutrition ([place])   . Carotid stenosis   . Coronary artery disease   . Fibromyalgia   . HLD (hyperlipidemia)   . Hx of cardiovascular stress test    a. Lex MV 11/13: + significant ECG changes and CHB with infusion; EF 81%, no ischemia,   . Hx of echocardiogram    a. Echo 12/12: EF 79-02%, grade 2 diastolic dysfunction, mild MR.; b. Echo 11/13:  mild LVH, EF 60-65%  . Hypertension   . Neuropathy   . Osteoporosis   . Syncope 08/2009   a. ETT-Echo 1/11:  poor ex tol, submax exercise, no WMA or ECG changes c/w ischemia;      Past Surgical History:  Procedure Laterality Date  . None  Social History   Socioeconomic History  . Marital status: Married    Spouse name: Not on file  . Number of children: 2  . Years of education: Not on file  . Highest education level: Not on file  Social Needs  . Financial resource strain: Not on file  . Food insecurity - worry: Not on file  . Food insecurity - inability: Not on file  . Transportation needs - medical: Not on file  . Transportation needs - non-medical: Not on file  Occupational History  . Not on file  Tobacco Use  . Smoking status: Current Every Day Smoker    Packs/day: 0.50    Types: Cigarettes  . Smokeless tobacco: Current User  . Tobacco comment: vapor cig.  Substance and Sexual Activity  . Alcohol use: Yes    Comment: glass wine  once every 6 mos. per patient  . Drug use: No  . Sexual activity: Not on file  Other Topics Concern  . Not on file  Social History Narrative   Lives with husband    Family History  Problem Relation Age of Onset  . CAD Mother 26       Status post PCI  . Breast cancer Mother   . Lung cancer Father   . Colon cancer Neg Hx     ROS: no fevers or chills, productive cough, hemoptysis, dysphasia, odynophagia, melena, hematochezia, dysuria, hematuria, rash, seizure activity, orthopnea, PND, pedal edema, claudication. Remaining systems are negative.  Physical Exam: Well-developed well-nourished in no acute distress.  Skin is warm and dry.  HEENT is normal.  Neck is supple.  Chest is clear to auscultation with normal expansion.  Cardiovascular exam is regular rate and rhythm.  Abdominal exam nontender or distended. No masses palpated. Extremities show no edema. neuro grossly intact  ECG- personally reviewed  A/P  1  Kirk Ruths, MD

## 2017-10-10 ENCOUNTER — Ambulatory Visit: Payer: Medicare HMO | Admitting: Cardiology

## 2017-11-05 ENCOUNTER — Ambulatory Visit: Payer: Medicare HMO | Admitting: Cardiology

## 2017-11-12 DIAGNOSIS — E7800 Pure hypercholesterolemia, unspecified: Secondary | ICD-10-CM | POA: Diagnosis not present

## 2017-11-13 LAB — HEPATIC FUNCTION PANEL
ALBUMIN: 4 g/dL (ref 3.5–4.8)
ALK PHOS: 135 IU/L — AB (ref 39–117)
ALT: 23 IU/L (ref 0–32)
AST: 29 IU/L (ref 0–40)
Bilirubin Total: 0.4 mg/dL (ref 0.0–1.2)
Bilirubin, Direct: 0.12 mg/dL (ref 0.00–0.40)
Total Protein: 6.7 g/dL (ref 6.0–8.5)

## 2017-11-13 LAB — LIPID PANEL
CHOLESTEROL TOTAL: 160 mg/dL (ref 100–199)
Chol/HDL Ratio: 3 ratio (ref 0.0–4.4)
HDL: 54 mg/dL (ref >39)
LDL CALC: 90 mg/dL (ref 0–99)
TRIGLYCERIDES: 81 mg/dL (ref 0–149)
VLDL CHOLESTEROL CAL: 16 mg/dL (ref 5–40)

## 2017-11-13 NOTE — Progress Notes (Signed)
Lipid panel shows well controlled cholesterol, triglyceride, HDL. The LDL (bad cholesterol) was borderline high, would recommend increase lipitor to 40mg  daily if possible.

## 2017-11-14 ENCOUNTER — Other Ambulatory Visit: Payer: Self-pay

## 2017-11-14 NOTE — Telephone Encounter (Signed)
-----   Message from [place], [place] sent at 11/13/2017  4:30 PM EST ----- Lipid panel shows well controlled cholesterol, triglyceride, HDL. The LDL (bad cholesterol) was borderline high, would recommend increase lipitor to 40mg  daily if possible.

## 2017-11-14 NOTE — Telephone Encounter (Signed)
Called patient but she stated she was busy and wanted me to call back.

## 2017-11-18 ENCOUNTER — Encounter: Payer: Self-pay | Admitting: Cardiology

## 2017-11-18 ENCOUNTER — Ambulatory Visit: Payer: Medicare HMO | Admitting: Cardiology

## 2017-11-18 VITALS — BP 102/64 | HR 94 | Ht 59.0 in | Wt 81.6 lb

## 2017-11-18 DIAGNOSIS — E441 Mild protein-calorie malnutrition: Secondary | ICD-10-CM

## 2017-11-18 DIAGNOSIS — I6523 Occlusion and stenosis of bilateral carotid arteries: Secondary | ICD-10-CM

## 2017-11-18 DIAGNOSIS — F17200 Nicotine dependence, unspecified, uncomplicated: Secondary | ICD-10-CM | POA: Diagnosis not present

## 2017-11-18 DIAGNOSIS — L659 Nonscarring hair loss, unspecified: Secondary | ICD-10-CM | POA: Insufficient documentation

## 2017-11-18 DIAGNOSIS — R9430 Abnormal result of cardiovascular function study, unspecified: Secondary | ICD-10-CM | POA: Diagnosis not present

## 2017-11-18 DIAGNOSIS — Z8679 Personal history of other diseases of the circulatory system: Secondary | ICD-10-CM | POA: Diagnosis not present

## 2017-11-18 DIAGNOSIS — E785 Hyperlipidemia, unspecified: Secondary | ICD-10-CM | POA: Insufficient documentation

## 2017-11-18 DIAGNOSIS — Z8619 Personal history of other infectious and parasitic diseases: Secondary | ICD-10-CM | POA: Insufficient documentation

## 2017-11-18 DIAGNOSIS — Z720 Tobacco use: Secondary | ICD-10-CM

## 2017-11-18 DIAGNOSIS — E46 Unspecified protein-calorie malnutrition: Secondary | ICD-10-CM | POA: Insufficient documentation

## 2017-11-18 NOTE — Assessment & Plan Note (Signed)
Still smoking

## 2017-11-18 NOTE — Assessment & Plan Note (Signed)
Mild.

## 2017-11-18 NOTE — Patient Instructions (Signed)
Your physician has recommended you make the following change in your medication: STOP atorvastatin (Lipitor).   Your physician recommends that you schedule a follow-up appointment in 4-6 weeks with Dr. Stanford Breed

## 2017-11-18 NOTE — Assessment & Plan Note (Signed)
Her LDL was 90 and she was instructed to increase her Lipitor to 40 mg but she did not as she felt it was causing her alopecia.

## 2017-11-18 NOTE — Assessment & Plan Note (Signed)
[place] [id number] complicated by transient CHB- low threshold for cath but no symptoms of angina

## 2017-11-18 NOTE — Progress Notes (Signed)
11/18/2017 Alexandra House   28-Dec-1945  [id number]  Primary Physician Marton Redwood, MD Primary Cardiologist: Dr Stanford Breed  HPI:  Pleasant 72 y/o female, 81 lbs, 4'11", BMI 16. She has a history of an abnormal Myoview in 2013. Myoview in Nov 2013 demonstrated no ischemia and an EF of 81%. She had a low-level exercise Lexiscan study and did develop significant ST abnormalities consistent with ischemia and transient complete heart block with infusion. She has been treated medically with low threshold for cath if recurrent symptoms. She has not had angina. In July 2018 she was admitted for two weeks with sepsis and shock complicated by respiratory failure, renal failure, and transient bradycardia. She was discharged to SNF but is now back home. Labs from her PCP in Sept looked good, her Hgb was 12, renal function normal, and TSH WNL. Recently she had a lipid panel that showed an LDL of 90. She tells me that she recently noted significant hair loss she believes is secondary to Lipitor. This is a possible side effect and I could not see any other obvious culprit so I suggested she stop it. Otherwise she is doing well though she admits she started smoking again.    Current Outpatient Medications  Medication Sig Dispense Refill  . aspirin EC 81 MG tablet Take 81 mg by mouth daily.    . benzonatate (TESSALON) 100 MG capsule Take 100 mg by mouth every 8 (eight) hours as needed for cough.  0  . BREO ELLIPTA 100-25 MCG/INH AEPB Inhale 1 puff into the lungs daily.  1  . calcium carbonate (OS-CAL) 600 MG TABS Take 600 mg by mouth 2 (two) times daily with a meal.    . denosumab (PROLIA) 60 MG/ML SOLN injection Inject 60 mg into the skin every 6 (six) months. Administer in upper arm, thigh, or abdomen    . ENSURE PLUS (ENSURE PLUS) LIQD Take 237 mLs by mouth daily.    Marland Kitchen EPINEPHrine (EPIPEN JR) 0.15 MG/0.3ML injection Inject 15 mg into the muscle as needed.  0  . ergocalciferol (VITAMIN D2) 50000 units capsule  Take 50,000 Units by mouth once a week.    Marland Kitchen guaiFENesin (MUCINEX) 600 MG 12 hr tablet Take by mouth as needed.    Marland Kitchen ibuprofen (ADVIL,MOTRIN) 200 MG tablet Take 200-400 mg by mouth every 6 (six) hours as needed for mild pain.     Marland Kitchen LORazepam (ATIVAN) 1 MG tablet Take 1 mg by mouth every 8 (eight) hours.    . Multiple Vitamins-Minerals (MULTIVITAMIN GUMMIES ADULT PO) Take 2 tablets by mouth daily.     No current facility-administered medications for this visit.     Allergies  Allergen Reactions  . Bee Venom Anaphylaxis and Hives    Past Medical History:  Diagnosis Date  . Acute on chronic respiratory failure ([place])   . Anxiety   . Caloric malnutrition ([place])   . Carotid stenosis   . Coronary artery disease   . Fibromyalgia   . HLD (hyperlipidemia)   . Hx of cardiovascular stress test    a. Lex MV 11/13: + significant ECG changes and CHB with infusion; EF 81%, no ischemia,   . Hx of echocardiogram    a. Echo 12/12: EF 09-32%, grade 2 diastolic dysfunction, mild MR.; b. Echo 11/13:  mild LVH, EF 60-65%  . Hypertension   . Neuropathy   . Osteoporosis   . Syncope 08/2009   a. ETT-Echo 1/11:  poor ex tol, submax exercise, no  WMA or ECG changes c/w ischemia;      Social History   Socioeconomic History  . Marital status: Married    Spouse name: Not on file  . Number of children: 2  . Years of education: Not on file  . Highest education level: Not on file  Social Needs  . Financial resource strain: Not on file  . Food insecurity - worry: Not on file  . Food insecurity - inability: Not on file  . Transportation needs - medical: Not on file  . Transportation needs - non-medical: Not on file  Occupational History  . Not on file  Tobacco Use  . Smoking status: Current Every Day Smoker    Packs/day: 0.50    Types: Cigarettes  . Smokeless tobacco: Current User  . Tobacco comment: vapor cig.  Substance and Sexual Activity  . Alcohol use: Yes    Comment: glass wine once every 6  mos. per patient  . Drug use: No  . Sexual activity: Not on file  Other Topics Concern  . Not on file  Social History Narrative   Lives with husband     Family History  Problem Relation Age of Onset  . CAD Mother 34       Status post PCI  . Breast cancer Mother   . Lung cancer Father   . Colon cancer Neg Hx      Review of Systems: General: negative for chills, fever, night sweats or weight changes.  Cardiovascular: negative for chest pain, dyspnea on exertion, edema, orthopnea, palpitations, paroxysmal nocturnal dyspnea or shortness of breath Dermatological: negative for rash Respiratory: negative for cough or wheezing Urologic: negative for hematuria Abdominal: negative for nausea, vomiting, diarrhea, bright red blood per rectum, melena, or hematemesis Neurologic: negative for visual changes, syncope, or dizziness All other systems reviewed and are otherwise negative except as noted above.    Blood pressure 102/64, pulse 94, height 4\' 11"  (1.499 m), weight 81 lb 9.6 oz (37 kg).  General appearance: alert, cooperative and no distress Neck: no carotid bruit and no JVD Lungs: clear to auscultation bilaterally Heart: regular rate and rhythm Extremities: faint distal pulses Skin: Skin color, texture, turgor normal. No rashes or lesions Neurologic: Grossly normal  EKG [id number]  ASSESSMENT AND PLAN:   Alopecia Pt's main complaint today is hair loss. She feels its secondary to Lipitor.   Dyslipidemia Her LDL was 90 and she was instructed to increase her Lipitor to 40 mg but she did not as she felt it was causing her alopecia.   Nonspecific abnormal results of cardiovascular function study [place] [address] complicated by transient CHB- low threshold for cath but no symptoms of angina  Tobacco abuse Still smoking  Carotid stenosis, bilateral Mild  Malnutrition (HCC) Pt weighs 81 lbs   PLAN  Hold Lipitor. F/U 4-6 weeks. Consider a different statin or a PCSK9.     Kerin Ransom PA-C 11/18/2017 3:06 PM

## 2017-11-18 NOTE — Assessment & Plan Note (Signed)
Pt weighs 81 lbs

## 2017-11-18 NOTE — Assessment & Plan Note (Signed)
Pt's main complaint today is hair loss. She feels its secondary to Lipitor.

## 2017-11-22 ENCOUNTER — Telehealth: Payer: Self-pay | Admitting: Cardiology

## 2017-11-22 DIAGNOSIS — E7800 Pure hypercholesterolemia, unspecified: Secondary | ICD-10-CM

## 2017-11-22 MED ORDER — ATORVASTATIN CALCIUM 40 MG PO TABS: 40.0000 mg | ORAL_TABLET | Freq: Every day | ORAL | 3 refills | Status: DC

## 2017-11-22 MED ORDER — ROSUVASTATIN CALCIUM 20 MG PO TABS: 20.0000 mg | ORAL_TABLET | Freq: Every day | ORAL | 12 refills | Status: DC

## 2017-11-22 NOTE — Telephone Encounter (Signed)
Discontinue Lipitor.  Treat with Crestor 20 mg daily.  Check lipids and liver in 4 weeks.

## 2017-11-22 NOTE — Telephone Encounter (Signed)
Spoke with pt, Aware of dr crenshaw's recommendations.  Lab orders mailed to the pt  

## 2017-11-22 NOTE — Telephone Encounter (Signed)
Alexandra House is returning a call.  Thanks

## 2017-11-22 NOTE — Telephone Encounter (Signed)
Returned call to patient.She stated she recently saw Kerin Ransom PA.She told Lurena Joiner her hair was falling out.He advised her to stop lipitor.Recent lipid panel revealed LDL 90 she was advised to increase lipitor to 40 mg daily.She wanted to ask Dr.Crenshaw his advice.

## 2017-11-22 NOTE — Telephone Encounter (Signed)
lmtcb

## 2018-01-17 ENCOUNTER — Encounter: Payer: Self-pay | Admitting: *Deleted

## 2018-02-20 DIAGNOSIS — D72829 Elevated white blood cell count, unspecified: Secondary | ICD-10-CM | POA: Diagnosis not present

## 2018-02-20 DIAGNOSIS — J3489 Other specified disorders of nose and nasal sinuses: Secondary | ICD-10-CM | POA: Diagnosis not present

## 2018-02-20 DIAGNOSIS — R82998 Other abnormal findings in urine: Secondary | ICD-10-CM | POA: Diagnosis not present

## 2018-02-20 DIAGNOSIS — E7849 Other hyperlipidemia: Secondary | ICD-10-CM | POA: Diagnosis not present

## 2018-02-20 DIAGNOSIS — R7301 Impaired fasting glucose: Secondary | ICD-10-CM | POA: Diagnosis not present

## 2018-02-20 DIAGNOSIS — R05 Cough: Secondary | ICD-10-CM | POA: Diagnosis not present

## 2018-02-20 DIAGNOSIS — E559 Vitamin D deficiency, unspecified: Secondary | ICD-10-CM | POA: Diagnosis not present

## 2018-02-20 DIAGNOSIS — R946 Abnormal results of thyroid function studies: Secondary | ICD-10-CM | POA: Diagnosis not present

## 2018-02-20 DIAGNOSIS — Z681 Body mass index (BMI) 19 or less, adult: Secondary | ICD-10-CM | POA: Diagnosis not present

## 2018-02-27 DIAGNOSIS — E7849 Other hyperlipidemia: Secondary | ICD-10-CM | POA: Diagnosis not present

## 2018-02-27 DIAGNOSIS — Z0000 Encounter for general adult medical examination without abnormal findings: Secondary | ICD-10-CM | POA: Diagnosis not present

## 2018-02-27 DIAGNOSIS — I7389 Other specified peripheral vascular diseases: Secondary | ICD-10-CM | POA: Diagnosis not present

## 2018-02-27 DIAGNOSIS — R7301 Impaired fasting glucose: Secondary | ICD-10-CM | POA: Diagnosis not present

## 2018-02-27 DIAGNOSIS — D72829 Elevated white blood cell count, unspecified: Secondary | ICD-10-CM | POA: Diagnosis not present

## 2018-02-27 DIAGNOSIS — E46 Unspecified protein-calorie malnutrition: Secondary | ICD-10-CM | POA: Diagnosis not present

## 2018-02-27 DIAGNOSIS — I5020 Unspecified systolic (congestive) heart failure: Secondary | ICD-10-CM | POA: Diagnosis not present

## 2018-02-27 DIAGNOSIS — I6523 Occlusion and stenosis of bilateral carotid arteries: Secondary | ICD-10-CM | POA: Diagnosis not present

## 2018-02-27 DIAGNOSIS — J449 Chronic obstructive pulmonary disease, unspecified: Secondary | ICD-10-CM | POA: Diagnosis not present

## 2018-02-27 NOTE — Progress Notes (Signed)
HPI: FU cardiomyopathy. Echo 11/13 demonstrated mild LVH and normal LV function. Myoview in Nov 2013 demonstrated no ischemia and an EF of 81%. She had a low-level exercise Lexiscan study and did develop significant ST abnormalities consistent with ischemia and transient complete heart block with infusion. She has been treated medically with low threshold for cath if recurrent symptoms. Carotid Dopplers September 2015 showed less than 40% bilateral stenosis.  Had prolonged hospitalization July 2018 secondary to respiratory failure felt secondary to emphysema and aspiration pneumonia.  She had elevated enzymes with troponin of 4.39.  Ischemia evaluation recommended as outpatient.  She also had transient junctional rhythm. Echocardiogram July 2018 showed ejection fraction 45%, moderate mitral regurgitation, mildly reduced RV function and mild tricuspid regurgitation.  Continued medical therapy versus catheterization discussed with patient and she preferred conservative management.  Since she was last seen she has mild dyspnea on exertion but no orthopnea, PND, pedal edema, chest pain or syncope.  No claudication but she notes some tingling in her extremities bilaterally.  Current Outpatient Medications  Medication Sig Dispense Refill  . alendronate (FOSAMAX) 70 MG tablet   11  . aspirin EC 81 MG tablet Take 81 mg by mouth daily.    . benzonatate (TESSALON) 100 MG capsule Take 100 mg by mouth every 8 (eight) hours as needed for cough.  0  . BREO ELLIPTA 100-25 MCG/INH AEPB Inhale 1 puff into the lungs daily.  1  . ENSURE PLUS (ENSURE PLUS) LIQD Take 237 mLs by mouth daily.    Marland Kitchen EPINEPHrine (EPIPEN JR) 0.15 MG/0.3ML injection Inject 15 mg into the muscle as needed.  0  . ergocalciferol (VITAMIN D2) 50000 units capsule Take 50,000 Units by mouth once a week.    Marland Kitchen guaiFENesin (MUCINEX) 600 MG 12 hr tablet Take by mouth as needed.    Marland Kitchen ibuprofen (ADVIL,MOTRIN) 200 MG tablet Take 200-400 mg by mouth  every 6 (six) hours as needed for mild pain.     Marland Kitchen LORazepam (ATIVAN) 1 MG tablet Take 1 mg by mouth every 8 (eight) hours.    . Multiple Vitamins-Minerals (MULTIVITAMIN GUMMIES ADULT PO) Take 2 tablets by mouth daily.    . rosuvastatin (CRESTOR) 10 MG tablet Take 1 tablet by mouth at bedtime.  11  . rosuvastatin (CRESTOR) 20 MG tablet Take 1 tablet (20 mg total) by mouth daily. 30 tablet 12   No current facility-administered medications for this visit.      Past Medical History:  Diagnosis Date  . Acute on chronic respiratory failure ([place])   . Anxiety   . Caloric malnutrition ([place])   . Carotid stenosis   . Coronary artery disease   . Fibromyalgia   . HLD (hyperlipidemia)   . Hx of cardiovascular stress test    a. Lex MV 11/13: + significant ECG changes and CHB with infusion; EF 81%, no ischemia,   . Hx of echocardiogram    a. Echo 12/12: EF 99-35%, grade 2 diastolic dysfunction, mild MR.; b. Echo 11/13:  mild LVH, EF 60-65%  . Hypertension   . Neuropathy   . Osteoporosis   . Syncope 08/2009   a. ETT-Echo 1/11:  poor ex tol, submax exercise, no WMA or ECG changes c/w ischemia;      Past Surgical History:  Procedure Laterality Date  . None      Social History   Socioeconomic History  . Marital status: Married    Spouse name: Not on file  .  Number of children: 2  . Years of education: Not on file  . Highest education level: Not on file  Occupational History  . Not on file  Social Needs  . Financial resource strain: Not on file  . Food insecurity:    Worry: Not on file    Inability: Not on file  . Transportation needs:    Medical: Not on file    Non-medical: Not on file  Tobacco Use  . Smoking status: Current Every Day Smoker    Packs/day: 0.50    Types: Cigarettes  . Smokeless tobacco: Current User  . Tobacco comment: vapor cig.  Substance and Sexual Activity  . Alcohol use: Yes    Comment: glass wine once every 6 mos. per patient  . Drug use: No  . Sexual  activity: Not on file  Lifestyle  . Physical activity:    Days per week: Not on file    Minutes per session: Not on file  . Stress: Not on file  Relationships  . Social connections:    Talks on phone: Not on file    Gets together: Not on file    Attends religious service: Not on file    Active member of club or organization: Not on file    Attends meetings of clubs or organizations: Not on file    Relationship status: Not on file  . Intimate partner violence:    Fear of current or ex partner: Not on file    Emotionally abused: Not on file    Physically abused: Not on file    Forced sexual activity: Not on file  Other Topics Concern  . Not on file  Social History Narrative   Lives with husband    Family History  Problem Relation Age of Onset  . CAD Mother 55       Status post PCI  . Breast cancer Mother   . Lung cancer Father   . Colon cancer Neg Hx     ROS: no fevers or chills, productive cough, hemoptysis, dysphasia, odynophagia, melena, hematochezia, dysuria, hematuria, rash, seizure activity, orthopnea, PND, pedal edema, claudication. Remaining systems are negative.  Physical Exam: Well-developed frail in no acute distress.  Skin is warm and dry.  HEENT is normal.  Neck is supple.  Bilateral carotid bruits Chest is clear to auscultation with normal expansion.  Cardiovascular exam is regular rate and rhythm.  Abdominal exam nontender or distended. No masses palpated. Extremities show no edema. neuro grossly intact  A/P  1 non-ST elevation myocardial infarction-patient had elevated troponin during previous acute illness.  I will arrange a dobutamine nuclear study for risk stratification.  Note she had complete heart block with Lexiscan in the past.  We can consider catheterization in the future pending stress test results.  She is not having chest pain at present.  2 hyperlipidemia-continue statin.  3 tobacco abuse-patient counseled on discontinuing.  4  cardiomyopathy-patient had a cardomyopathy during previous hospitalization with ejection fraction 45%.  Will repeat echocardiogram to reassess.  Kirk Ruths, MD

## 2018-03-03 ENCOUNTER — Telehealth: Payer: Self-pay | Admitting: Cardiology

## 2018-03-03 NOTE — Telephone Encounter (Signed)
Received Records from [facility] on 03/03/18, Appt 03/07/18 @ 3:00PM. [place]

## 2018-03-07 ENCOUNTER — Encounter: Payer: Self-pay | Admitting: Cardiology

## 2018-03-07 ENCOUNTER — Ambulatory Visit: Payer: Medicare HMO | Admitting: Cardiology

## 2018-03-07 VITALS — BP 122/68 | HR 68 | Ht 59.0 in | Wt 81.8 lb

## 2018-03-07 DIAGNOSIS — I213 ST elevation (STEMI) myocardial infarction of unspecified site: Secondary | ICD-10-CM

## 2018-03-07 DIAGNOSIS — E7800 Pure hypercholesterolemia, unspecified: Secondary | ICD-10-CM | POA: Diagnosis not present

## 2018-03-07 DIAGNOSIS — Z720 Tobacco use: Secondary | ICD-10-CM

## 2018-03-07 DIAGNOSIS — I255 Ischemic cardiomyopathy: Secondary | ICD-10-CM | POA: Diagnosis not present

## 2018-03-07 NOTE — Patient Instructions (Signed)
Medication Instructions:   NO CHANGE  Testing/Procedures:  Your physician has requested that you have a dobutamine myoview. For furth information please visit [email or web address]. Please follow instruction sheet, as given.   Your physician has requested that you have an echocardiogram. Echocardiography is a painless test that uses sound waves to create images of your heart. It provides your doctor with information about the size and shape of your heart and how well your heart's chambers and valves are working. This procedure takes approximately one hour. There are no restrictions for this procedure.    Follow-Up:  Your physician wants you to follow-up in: [place] will receive a reminder letter in the mail two months in advance. If you don't receive a letter, please call our office to schedule the follow-up appointment.

## 2018-03-19 ENCOUNTER — Other Ambulatory Visit (HOSPITAL_COMMUNITY): Payer: Self-pay | Admitting: Internal Medicine

## 2018-03-19 DIAGNOSIS — I6523 Occlusion and stenosis of bilateral carotid arteries: Secondary | ICD-10-CM

## 2018-03-19 DIAGNOSIS — I739 Peripheral vascular disease, unspecified: Secondary | ICD-10-CM

## 2018-03-21 ENCOUNTER — Inpatient Hospital Stay (HOSPITAL_COMMUNITY): Admission: RE | Admit: 2018-03-21 | Payer: Medicare HMO | Source: Ambulatory Visit

## 2018-03-21 ENCOUNTER — Encounter (HOSPITAL_COMMUNITY): Payer: Medicare HMO

## 2018-04-28 ENCOUNTER — Encounter (HOSPITAL_COMMUNITY): Payer: Medicare HMO

## 2018-04-28 ENCOUNTER — Inpatient Hospital Stay (HOSPITAL_COMMUNITY): Admission: RE | Admit: 2018-04-28 | Payer: Medicare HMO | Source: Ambulatory Visit

## 2018-06-17 DIAGNOSIS — M797 Fibromyalgia: Secondary | ICD-10-CM | POA: Diagnosis not present

## 2018-06-17 DIAGNOSIS — G6289 Other specified polyneuropathies: Secondary | ICD-10-CM | POA: Diagnosis not present

## 2018-06-17 DIAGNOSIS — Z23 Encounter for immunization: Secondary | ICD-10-CM | POA: Diagnosis not present

## 2018-06-17 DIAGNOSIS — F3289 Other specified depressive episodes: Secondary | ICD-10-CM | POA: Diagnosis not present

## 2018-06-17 DIAGNOSIS — D72829 Elevated white blood cell count, unspecified: Secondary | ICD-10-CM | POA: Diagnosis not present

## 2018-06-17 DIAGNOSIS — E7849 Other hyperlipidemia: Secondary | ICD-10-CM | POA: Diagnosis not present

## 2018-06-17 DIAGNOSIS — R7301 Impaired fasting glucose: Secondary | ICD-10-CM | POA: Diagnosis not present

## 2018-06-17 DIAGNOSIS — M810 Age-related osteoporosis without current pathological fracture: Secondary | ICD-10-CM | POA: Diagnosis not present

## 2018-06-17 DIAGNOSIS — J449 Chronic obstructive pulmonary disease, unspecified: Secondary | ICD-10-CM | POA: Diagnosis not present

## 2018-06-17 DIAGNOSIS — I7389 Other specified peripheral vascular diseases: Secondary | ICD-10-CM | POA: Diagnosis not present

## 2018-06-23 ENCOUNTER — Ambulatory Visit (INDEPENDENT_AMBULATORY_CARE_PROVIDER_SITE_OTHER)
Admission: RE | Admit: 2018-06-23 | Discharge: 2018-06-23 | Disposition: A | Discharge status: Home / Self Care | Payer: Medicare HMO | Source: Ambulatory Visit | Attending: Internal Medicine | Admitting: Internal Medicine

## 2018-06-23 ENCOUNTER — Ambulatory Visit (HOSPITAL_COMMUNITY)
Admission: RE | Admit: 2018-06-23 | Discharge: 2018-06-23 | Disposition: A | Discharge status: Home / Self Care | Payer: Medicare HMO | Source: Ambulatory Visit | Attending: Internal Medicine | Admitting: Internal Medicine

## 2018-06-23 DIAGNOSIS — I739 Peripheral vascular disease, unspecified: Secondary | ICD-10-CM

## 2018-06-23 DIAGNOSIS — F17200 Nicotine dependence, unspecified, uncomplicated: Secondary | ICD-10-CM | POA: Diagnosis not present

## 2018-06-23 DIAGNOSIS — I252 Old myocardial infarction: Secondary | ICD-10-CM | POA: Diagnosis not present

## 2018-06-23 DIAGNOSIS — I6523 Occlusion and stenosis of bilateral carotid arteries: Secondary | ICD-10-CM | POA: Diagnosis not present

## 2018-06-24 ENCOUNTER — Other Ambulatory Visit: Payer: Self-pay

## 2018-06-24 ENCOUNTER — Ambulatory Visit (HOSPITAL_COMMUNITY): Payer: Medicare HMO | Attending: Cardiology

## 2018-06-24 DIAGNOSIS — E785 Hyperlipidemia, unspecified: Secondary | ICD-10-CM | POA: Diagnosis not present

## 2018-06-24 DIAGNOSIS — I509 Heart failure, unspecified: Secondary | ICD-10-CM | POA: Insufficient documentation

## 2018-06-24 DIAGNOSIS — I340 Nonrheumatic mitral (valve) insufficiency: Secondary | ICD-10-CM | POA: Insufficient documentation

## 2018-06-24 DIAGNOSIS — J449 Chronic obstructive pulmonary disease, unspecified: Secondary | ICD-10-CM | POA: Diagnosis not present

## 2018-06-24 DIAGNOSIS — Z8673 Personal history of transient ischemic attack (TIA), and cerebral infarction without residual deficits: Secondary | ICD-10-CM | POA: Diagnosis not present

## 2018-06-24 DIAGNOSIS — I213 ST elevation (STEMI) myocardial infarction of unspecified site: Secondary | ICD-10-CM | POA: Diagnosis not present

## 2018-06-24 DIAGNOSIS — I6529 Occlusion and stenosis of unspecified carotid artery: Secondary | ICD-10-CM | POA: Diagnosis not present

## 2018-07-01 ENCOUNTER — Telehealth (HOSPITAL_COMMUNITY): Payer: Self-pay

## 2018-07-01 NOTE — Telephone Encounter (Signed)
Encounter complete. 

## 2018-07-03 ENCOUNTER — Ambulatory Visit (HOSPITAL_COMMUNITY)
Admission: RE | Admit: 2018-07-03 | Discharge: 2018-07-03 | Disposition: A | Discharge status: Home / Self Care | Payer: Medicare HMO | Source: Ambulatory Visit | Attending: Cardiovascular Disease | Admitting: Cardiovascular Disease

## 2018-07-03 DIAGNOSIS — I213 ST elevation (STEMI) myocardial infarction of unspecified site: Secondary | ICD-10-CM

## 2018-07-03 DIAGNOSIS — R7889 Finding of other specified substances, not normally found in blood: Secondary | ICD-10-CM | POA: Diagnosis not present

## 2018-07-03 DIAGNOSIS — I214 Non-ST elevation (NSTEMI) myocardial infarction: Secondary | ICD-10-CM | POA: Diagnosis not present

## 2018-07-03 LAB — MYOCARDIAL PERFUSION IMAGING
CHL CUP NUCLEAR SDS: 3
CHL CUP RESTING HR STRESS: 64 {beats}/min
CSEPPHR: 124 {beats}/min
LV dias vol: 53 mL (ref 46–106)
LV sys vol: 24 mL
SRS: 4
SSS: 7
TID: 0.85

## 2018-07-03 MED ORDER — TECHNETIUM TC 99M TETROFOSMIN IV KIT
10.5000 | PACK | Freq: Once | INTRAVENOUS | Status: AC | PRN
  Administered 2018-07-03: 10.5 via INTRAVENOUS
  Filled 2018-07-03: qty 11

## 2018-07-03 MED ORDER — SODIUM CHLORIDE 0.9 % IV SOLN
30.0000 ug/kg/min | INTRAVENOUS | Status: DC
  Administered 2018-07-03: 30 ug/kg/min via INTRAVENOUS

## 2018-07-03 MED ORDER — TECHNETIUM TC 99M TETROFOSMIN IV KIT
29.5000 | PACK | Freq: Once | INTRAVENOUS | Status: AC | PRN
  Administered 2018-07-03: 29.5 via INTRAVENOUS
  Filled 2018-07-03: qty 30

## 2018-07-03 MED ORDER — REGADENOSON 0.4 MG/5ML IV SOLN: 0.4000 mg | Freq: Once | INTRAVENOUS | Status: DC

## 2018-07-08 DIAGNOSIS — R05 Cough: Secondary | ICD-10-CM | POA: Diagnosis not present

## 2018-07-08 DIAGNOSIS — J441 Chronic obstructive pulmonary disease with (acute) exacerbation: Secondary | ICD-10-CM | POA: Diagnosis not present

## 2018-07-08 DIAGNOSIS — Z681 Body mass index (BMI) 19 or less, adult: Secondary | ICD-10-CM | POA: Diagnosis not present

## 2018-07-18 DIAGNOSIS — H52223 Regular astigmatism, bilateral: Secondary | ICD-10-CM | POA: Diagnosis not present

## 2018-07-18 DIAGNOSIS — H5203 Hypermetropia, bilateral: Secondary | ICD-10-CM | POA: Diagnosis not present

## 2018-07-18 DIAGNOSIS — H40013 Open angle with borderline findings, low risk, bilateral: Secondary | ICD-10-CM | POA: Diagnosis not present

## 2018-07-18 DIAGNOSIS — H2513 Age-related nuclear cataract, bilateral: Secondary | ICD-10-CM | POA: Diagnosis not present

## 2018-07-18 DIAGNOSIS — H25813 Combined forms of age-related cataract, bilateral: Secondary | ICD-10-CM | POA: Diagnosis not present

## 2018-08-18 ENCOUNTER — Encounter: Payer: Self-pay | Admitting: Cardiology

## 2018-08-27 ENCOUNTER — Other Ambulatory Visit: Payer: Self-pay

## 2018-08-27 ENCOUNTER — Ambulatory Visit: Payer: Medicare HMO | Admitting: Vascular Surgery

## 2018-08-27 ENCOUNTER — Encounter: Payer: Self-pay | Admitting: Vascular Surgery

## 2018-08-27 VITALS — BP 97/65 | HR 72 | Temp 98.3°F | Resp 16 | Ht 59.0 in | Wt 83.7 lb

## 2018-08-27 DIAGNOSIS — I739 Peripheral vascular disease, unspecified: Secondary | ICD-10-CM

## 2018-08-27 NOTE — Progress Notes (Signed)
REASON FOR CONSULT:    Evaluate for peripheral vascular disease.  The consult is requested by Dr. Marton Redwood.  HPI:   Alexandra House is a pleasant 73 y.o. female, who was referred for evaluation of peripheral vascular disease. I have reviewed the records from the referring office.  The patient was seen on September 13.  The patient had been having some pain in her feet and legs and was concerned that she may have peripheral vascular disease with claudication.  She was scheduled for vascular evaluation.  She does have a history of peripheral neuropathy.  The patient does describe some pain in her legs that involves mostly her thighs and calves which is brought on by ambulation.  However she also experience the symptoms with standing and sitting.  In addition when she stops walking it takes some time for the symptoms to go away.  The only thing that seems to help is lying flat.  She denies any history of rest pain or nonhealing ulcers.  She does have a history of tobacco use.  She smokes a pack per day and is been smoking for 30 years.  She had a myocardial infarction in the past and is followed by Dr. Kirk Ruths.  She is on aspirin and is on a statin.  She denies any history of stroke, TIAs, expressive or receptive aphasia, or amaurosis fugax.  Past Medical History:  Diagnosis Date  . Acute on chronic respiratory failure ([place])   . Anxiety   . Caloric malnutrition ([place])   . Carotid stenosis   . Coronary artery disease   . Fibromyalgia   . HLD (hyperlipidemia)   . Hx of cardiovascular stress test    a. Lex MV 11/13: + significant ECG changes and CHB with infusion; EF 81%, no ischemia,   . Hx of echocardiogram    a. Echo 12/12: EF 95-28%, grade 2 diastolic dysfunction, mild MR.; b. Echo 11/13:  mild LVH, EF 60-65%  . Hypertension   . Neuropathy   . Osteoporosis   . Syncope 08/2009   a. ETT-Echo 1/11:  poor ex tol, submax exercise, no WMA or ECG changes c/w ischemia;       Family History  Problem Relation Age of Onset  . CAD Mother 61       Status post PCI  . Breast cancer Mother   . Lung cancer Father   . Colon cancer Neg Hx     SOCIAL HISTORY: The patient smokes 1 pack/day of cigarettes and is been smoking for 30 years. Social History   Socioeconomic History  . Marital status: Married    Spouse name: Not on file  . Number of children: 2  . Years of education: Not on file  . Highest education level: Not on file  Occupational History  . Not on file  Social Needs  . Financial resource strain: Not on file  . Food insecurity:    Worry: Not on file    Inability: Not on file  . Transportation needs:    Medical: Not on file    Non-medical: Not on file  Tobacco Use  . Smoking status: Current Every Day Smoker    Packs/day: 1.00    Types: Cigarettes  . Smokeless tobacco: Current User  Substance and Sexual Activity  . Alcohol use: Yes    Comment: glass wine once every 6 mos. per patient  . Drug use: No  . Sexual activity: Not on file  Lifestyle  . Physical  activity:    Days per week: Not on file    Minutes per session: Not on file  . Stress: Not on file  Relationships  . Social connections:    Talks on phone: Not on file    Gets together: Not on file    Attends religious service: Not on file    Active member of club or organization: Not on file    Attends meetings of clubs or organizations: Not on file    Relationship status: Not on file  . Intimate partner violence:    Fear of current or ex partner: Not on file    Emotionally abused: Not on file    Physically abused: Not on file    Forced sexual activity: Not on file  Other Topics Concern  . Not on file  Social History Narrative   Lives with husband    Allergies  Allergen Reactions  . Bee Venom Anaphylaxis and Hives    Current Outpatient Medications  Medication Sig Dispense Refill  . alendronate (FOSAMAX) 70 MG tablet   11  . aspirin EC 81 MG tablet Take 81 mg by mouth  daily.    . benzonatate (TESSALON) 100 MG capsule Take 100 mg by mouth every 8 (eight) hours as needed for cough.  0  . BREO ELLIPTA 100-25 MCG/INH AEPB Inhale 1 puff into the lungs daily.  1  . ENSURE PLUS (ENSURE PLUS) LIQD Take 237 mLs by mouth daily.    Marland Kitchen EPINEPHrine (EPIPEN JR) 0.15 MG/0.3ML injection Inject 15 mg into the muscle as needed.  0  . ergocalciferol (VITAMIN D2) 50000 units capsule Take 50,000 Units by mouth once a week.    Marland Kitchen guaiFENesin (MUCINEX) 600 MG 12 hr tablet Take by mouth as needed.    Marland Kitchen ibuprofen (ADVIL,MOTRIN) 200 MG tablet Take 200-400 mg by mouth every 6 (six) hours as needed for mild pain.     Marland Kitchen LORazepam (ATIVAN) 1 MG tablet Take 1 mg by mouth every 8 (eight) hours.    . Multiple Vitamins-Minerals (MULTIVITAMIN GUMMIES ADULT PO) Take 2 tablets by mouth daily.    . rosuvastatin (CRESTOR) 10 MG tablet Take 1 tablet by mouth at bedtime.  11  . rosuvastatin (CRESTOR) 20 MG tablet Take 1 tablet (20 mg total) by mouth daily. 30 tablet 12   No current facility-administered medications for this visit.     REVIEW OF SYSTEMS:  [X]  denotes positive finding, [ ]  denotes negative finding Cardiac  Comments:  Chest pain or chest pressure:    Shortness of breath upon exertion: x   Short of breath when lying flat:    Irregular heart rhythm:        Vascular    Pain in calf, thigh, or hip brought on by ambulation:    Pain in feet at night that wakes you up from your sleep:     Blood clot in your veins:    Leg swelling:         Pulmonary    Oxygen at home:    Productive cough:     Wheezing:         Neurologic    Sudden weakness in arms or legs:     Sudden numbness in arms or legs:     Sudden onset of difficulty speaking or slurred speech:    Temporary loss of vision in one eye:     Problems with dizziness:         Gastrointestinal  Blood in stool:     Vomited blood:         Genitourinary    Burning when urinating:     Blood in urine:        Psychiatric     Major depression:         Hematologic    Bleeding problems:    Problems with blood clotting too easily:        Skin    Rashes or ulcers:        Constitutional    Fever or chills:     PHYSICAL EXAM:   Vitals:   08/27/18 1128  BP: 97/65  Pulse: 72  Resp: 16  Temp: 98.3 F (36.8 C)  TempSrc: Oral  SpO2: 100%  Weight: 83 lb 11.2 oz (38 kg)  Height: 4\' 11"  (1.499 m)    GENERAL: The patient is a well-nourished female, in no acute distress. The vital signs are documented above. CARDIAC: There is a regular rate and rhythm.  VASCULAR: She has bilateral carotid bruits. She has palpable femoral pulses.  I cannot palpate pedal pulses however both feet appear adequately perfused. She has no significant lower extremity swelling. PULMONARY: There is good air exchange bilaterally without wheezing or rales. ABDOMEN: Soft and non-tender with normal pitched bowel sounds.  MUSCULOSKELETAL: There are no major deformities or cyanosis. NEUROLOGIC: No focal weakness or paresthesias are detected. SKIN: There are no ulcers or rashes noted. PSYCHIATRIC: The patient has a normal affect.  DATA:    CAROTID DUPLEX: I have reviewed the carotid duplex scan that was done on 06/23/2018.  The patient had a less than 39% right carotid stenosis and a less than 39% left carotid stenosis.  Both vertebral arteries were patent with antegrade flow.  ARTERIAL DOPPLER STUDY: I have reviewed the arterial Doppler study that was done on 06/23/2018.  On the right there was a triphasic dorsalis pedis and posterior tibial signal.  ABI was 83%.  Toe pressure was 82 mmHg.  On the left side there is a triphasic dorsalis pedis and posterior tibial signal.  ABI was 84%.  Toe pressure on the left was 77 mmHg.   ASSESSMENT & PLAN:   PERIPHERAL VASCULAR DISEASE: Her symptoms do not fit necessarily with symptoms from peripheral vascular disease.  She experiences pain in her legs with simply standing and sitting.  In addition  her symptoms do not go away after she stops walking.  She has triphasic Doppler signals in both feet.  She may have some mild underlying tibial disease as her ABIs are slightly diminished.  However I do not recommend any further aggressive work-up at this time.  We have had a long discussion about the importance of tobacco cessation.  In addition I have encouraged her to walk is much as possible we have also discussed nutrition.  If her symptoms progress and certainly I be happy to see her back at any time for continued follow-up.  BILATERAL CAROTID BRUITS: Patient does have bilateral carotid bruits.  Carotid duplex scan in September showed no evidence of significant carotid disease.  She is asymptomatic.  She is on aspirin and is on a statin.   Deitra Mayo [facility] [phone]

## 2018-09-01 NOTE — Progress Notes (Signed)
HPI: FU cardiomyopathy. Myoview in Nov 2013 demonstrated no ischemia and an EF of 81%. She had a low-level exercise Lexiscan study and did develop significant ST abnormalities consistent with ischemia and transient complete heart block with infusion. She has been treated medically with low threshold for cath if recurrent symptoms. Carotid Dopplers September 2015 showed less than 40% bilateral stenosis.  Had prolonged hospitalization July 2018 secondary to respiratory failure felt secondary to emphysema and aspiration pneumonia.  She had elevated enzymes with troponin of 4.39.  Ischemia evaluation recommended as outpatient. Continued medical therapy versus catheterization discussed with patient and she preferred conservative management. Nuclear study September 2019 showed ejection fraction 45% and no ischemia or infarction.  Echocardiogram September 2019 showed normal LV function and moderate mitral regurgitation.  Carotid Doppler September 2019 showed 1 to 39% bilateral stenosis.  Since she was last seen she denies dyspnea or chest pain.  She continues to complain of bilateral lower extremity pain.  Current Outpatient Medications  Medication Sig Dispense Refill  . alendronate (FOSAMAX) 70 MG tablet   11  . aspirin EC 81 MG tablet Take 81 mg by mouth daily.    . benzonatate (TESSALON) 100 MG capsule Take 100 mg by mouth every 8 (eight) hours as needed for cough.  0  . BREO ELLIPTA 100-25 MCG/INH AEPB Inhale 1 puff into the lungs daily.  1  . ENSURE PLUS (ENSURE PLUS) LIQD Take 237 mLs by mouth daily.    Marland Kitchen EPINEPHrine (EPIPEN JR) 0.15 MG/0.3ML injection Inject 15 mg into the muscle as needed.  0  . ergocalciferol (VITAMIN D2) 50000 units capsule Take 50,000 Units by mouth once a week.    Marland Kitchen guaiFENesin (MUCINEX) 600 MG 12 hr tablet Take by mouth as needed.    Marland Kitchen ibuprofen (ADVIL,MOTRIN) 200 MG tablet Take 200-400 mg by mouth every 6 (six) hours as needed for mild pain.     Marland Kitchen LORazepam (ATIVAN) 1  MG tablet Take 1 mg by mouth every 8 (eight) hours.    . Multiple Vitamins-Minerals (MULTIVITAMIN GUMMIES ADULT PO) Take 2 tablets by mouth daily.    . rosuvastatin (CRESTOR) 10 MG tablet Take 1 tablet by mouth at bedtime.  11   No current facility-administered medications for this visit.      Past Medical History:  Diagnosis Date  . Acute on chronic respiratory failure ([place])   . Anxiety   . Caloric malnutrition ([place])   . Carotid stenosis   . Coronary artery disease   . Fibromyalgia   . HLD (hyperlipidemia)   . Hx of cardiovascular stress test    a. Lex MV 11/13: + significant ECG changes and CHB with infusion; EF 81%, no ischemia,   . Hx of echocardiogram    a. Echo 12/12: EF 46-96%, grade 2 diastolic dysfunction, mild MR.; b. Echo 11/13:  mild LVH, EF 60-65%  . Hypertension   . Neuropathy   . Osteoporosis   . Syncope 08/2009   a. ETT-Echo 1/11:  poor ex tol, submax exercise, no WMA or ECG changes c/w ischemia;      Past Surgical History:  Procedure Laterality Date  . None      Social History   Socioeconomic History  . Marital status: Married    Spouse name: Not on file  . Number of children: 2  . Years of education: Not on file  . Highest education level: Not on file  Occupational History  . Not on file  Social  Needs  . Financial resource strain: Not on file  . Food insecurity:    Worry: Not on file    Inability: Not on file  . Transportation needs:    Medical: Not on file    Non-medical: Not on file  Tobacco Use  . Smoking status: Current Every Day Smoker    Packs/day: 1.00    Types: Cigarettes  . Smokeless tobacco: Current User  Substance and Sexual Activity  . Alcohol use: Yes    Comment: glass wine once every 6 mos. per patient  . Drug use: No  . Sexual activity: Not on file  Lifestyle  . Physical activity:    Days per week: Not on file    Minutes per session: Not on file  . Stress: Not on file  Relationships  . Social connections:    Talks on  phone: Not on file    Gets together: Not on file    Attends religious service: Not on file    Active member of club or organization: Not on file    Attends meetings of clubs or organizations: Not on file    Relationship status: Not on file  . Intimate partner violence:    Fear of current or ex partner: Not on file    Emotionally abused: Not on file    Physically abused: Not on file    Forced sexual activity: Not on file  Other Topics Concern  . Not on file  Social History Narrative   Lives with husband    Family History  Problem Relation Age of Onset  . CAD Mother 66       Status post PCI  . Breast cancer Mother   . Lung cancer Father   . Colon cancer Neg Hx     ROS: no fevers or chills, productive cough, hemoptysis, dysphasia, odynophagia, melena, hematochezia, dysuria, hematuria, rash, seizure activity, orthopnea, PND, pedal edema. Remaining systems are negative.  Physical Exam: Well-developed well-nourished in no acute distress.  Skin is warm and dry.  HEENT is normal.  Neck is supple.  Chest is clear to auscultation with normal expansion.  Cardiovascular exam is regular rate and rhythm.  Abdominal exam nontender or distended. No masses palpated. Extremities show no edema. neuro grossly intact  A/P  1 history of elevated troponin-we did schedule a follow-up nuclear study that showed no ischemia.  She has not had further chest pain.  We will not pursue further evaluation at this time.  I will continue aspirin and statin given history of elevated troponin.  Likely has underlying coronary disease but not significant by nuclear study.  2 cardiomyopathy-LV function has normalized on most recent echocardiogram.  3 moderate mitral regurgitation-she will need follow-up echoes in the future.  4 hyperlipidemia-continue statin.  5 tobacco abuse-patient counseled on discontinuing.  6 peripheral vascular disease-seen by Dr. Scot Dock and medical therapy recommended.  Kirk Ruths, MD

## 2018-09-08 ENCOUNTER — Ambulatory Visit: Payer: Medicare HMO | Admitting: Cardiology

## 2018-09-08 ENCOUNTER — Encounter: Payer: Self-pay | Admitting: Cardiology

## 2018-09-08 VITALS — BP 126/62 | HR 64 | Ht 59.0 in | Wt 84.0 lb

## 2018-09-08 DIAGNOSIS — I420 Dilated cardiomyopathy: Secondary | ICD-10-CM

## 2018-09-08 DIAGNOSIS — F17200 Nicotine dependence, unspecified, uncomplicated: Secondary | ICD-10-CM

## 2018-09-08 DIAGNOSIS — E7800 Pure hypercholesterolemia, unspecified: Secondary | ICD-10-CM

## 2018-09-08 DIAGNOSIS — I340 Nonrheumatic mitral (valve) insufficiency: Secondary | ICD-10-CM | POA: Diagnosis not present

## 2018-09-08 NOTE — Patient Instructions (Signed)

## 2018-10-27 DIAGNOSIS — H1852 Epithelial (juvenile) corneal dystrophy: Secondary | ICD-10-CM | POA: Diagnosis not present

## 2018-10-27 DIAGNOSIS — H2512 Age-related nuclear cataract, left eye: Secondary | ICD-10-CM | POA: Diagnosis not present

## 2018-10-27 DIAGNOSIS — H2513 Age-related nuclear cataract, bilateral: Secondary | ICD-10-CM | POA: Diagnosis not present

## 2018-10-27 DIAGNOSIS — H2511 Age-related nuclear cataract, right eye: Secondary | ICD-10-CM | POA: Diagnosis not present

## 2018-10-27 DIAGNOSIS — Z01818 Encounter for other preprocedural examination: Secondary | ICD-10-CM | POA: Diagnosis not present

## 2018-10-27 DIAGNOSIS — H02831 Dermatochalasis of right upper eyelid: Secondary | ICD-10-CM | POA: Diagnosis not present

## 2018-11-14 DIAGNOSIS — H524 Presbyopia: Secondary | ICD-10-CM | POA: Diagnosis not present

## 2018-11-14 DIAGNOSIS — H25812 Combined forms of age-related cataract, left eye: Secondary | ICD-10-CM | POA: Diagnosis not present

## 2018-11-14 DIAGNOSIS — H52223 Regular astigmatism, bilateral: Secondary | ICD-10-CM | POA: Diagnosis not present

## 2018-11-14 DIAGNOSIS — H5213 Myopia, bilateral: Secondary | ICD-10-CM | POA: Diagnosis not present

## 2018-11-14 DIAGNOSIS — Z9842 Cataract extraction status, left eye: Secondary | ICD-10-CM | POA: Diagnosis not present

## 2018-11-14 DIAGNOSIS — Z961 Presence of intraocular lens: Secondary | ICD-10-CM | POA: Diagnosis not present

## 2018-11-14 DIAGNOSIS — H2512 Age-related nuclear cataract, left eye: Secondary | ICD-10-CM | POA: Diagnosis not present

## 2019-03-04 DIAGNOSIS — E785 Hyperlipidemia, unspecified: Secondary | ICD-10-CM | POA: Diagnosis not present

## 2019-03-04 DIAGNOSIS — I739 Peripheral vascular disease, unspecified: Secondary | ICD-10-CM | POA: Diagnosis not present

## 2019-03-04 DIAGNOSIS — I6523 Occlusion and stenosis of bilateral carotid arteries: Secondary | ICD-10-CM | POA: Diagnosis not present

## 2019-03-04 DIAGNOSIS — R7301 Impaired fasting glucose: Secondary | ICD-10-CM | POA: Diagnosis not present

## 2019-03-04 DIAGNOSIS — J449 Chronic obstructive pulmonary disease, unspecified: Secondary | ICD-10-CM | POA: Diagnosis not present

## 2019-03-04 DIAGNOSIS — I5020 Unspecified systolic (congestive) heart failure: Secondary | ICD-10-CM | POA: Diagnosis not present

## 2019-03-04 DIAGNOSIS — E46 Unspecified protein-calorie malnutrition: Secondary | ICD-10-CM | POA: Diagnosis not present

## 2019-03-04 DIAGNOSIS — Z1331 Encounter for screening for depression: Secondary | ICD-10-CM | POA: Diagnosis not present

## 2019-03-04 DIAGNOSIS — Z0000 Encounter for general adult medical examination without abnormal findings: Secondary | ICD-10-CM | POA: Diagnosis not present

## 2019-03-04 DIAGNOSIS — F3342 Major depressive disorder, recurrent, in full remission: Secondary | ICD-10-CM | POA: Diagnosis not present

## 2019-03-12 ENCOUNTER — Other Ambulatory Visit: Payer: Self-pay | Admitting: Internal Medicine

## 2019-03-12 DIAGNOSIS — F17218 Nicotine dependence, cigarettes, with other nicotine-induced disorders: Secondary | ICD-10-CM

## 2019-03-26 IMAGING — DX DG CHEST 2V
2 series · 2 of 2 positions shown · non-contrast
Comparison: 09/04/2006.  Chest CT, 01/07/2017.

CLINICAL DATA: Pt states that she was at church this morning and
passed out. Pt doesn't remember what happened prior to that or
afterwards. Pt states that she has felt weak today because she has
been sick and vomiting since [REDACTED]. Due to N/V, pt has not been
able to keep hardly anything down. Pt reports that her back has been
hurting this week as well. Non smoker.

EXAM:
CHEST  2 VIEW

[chest lat]
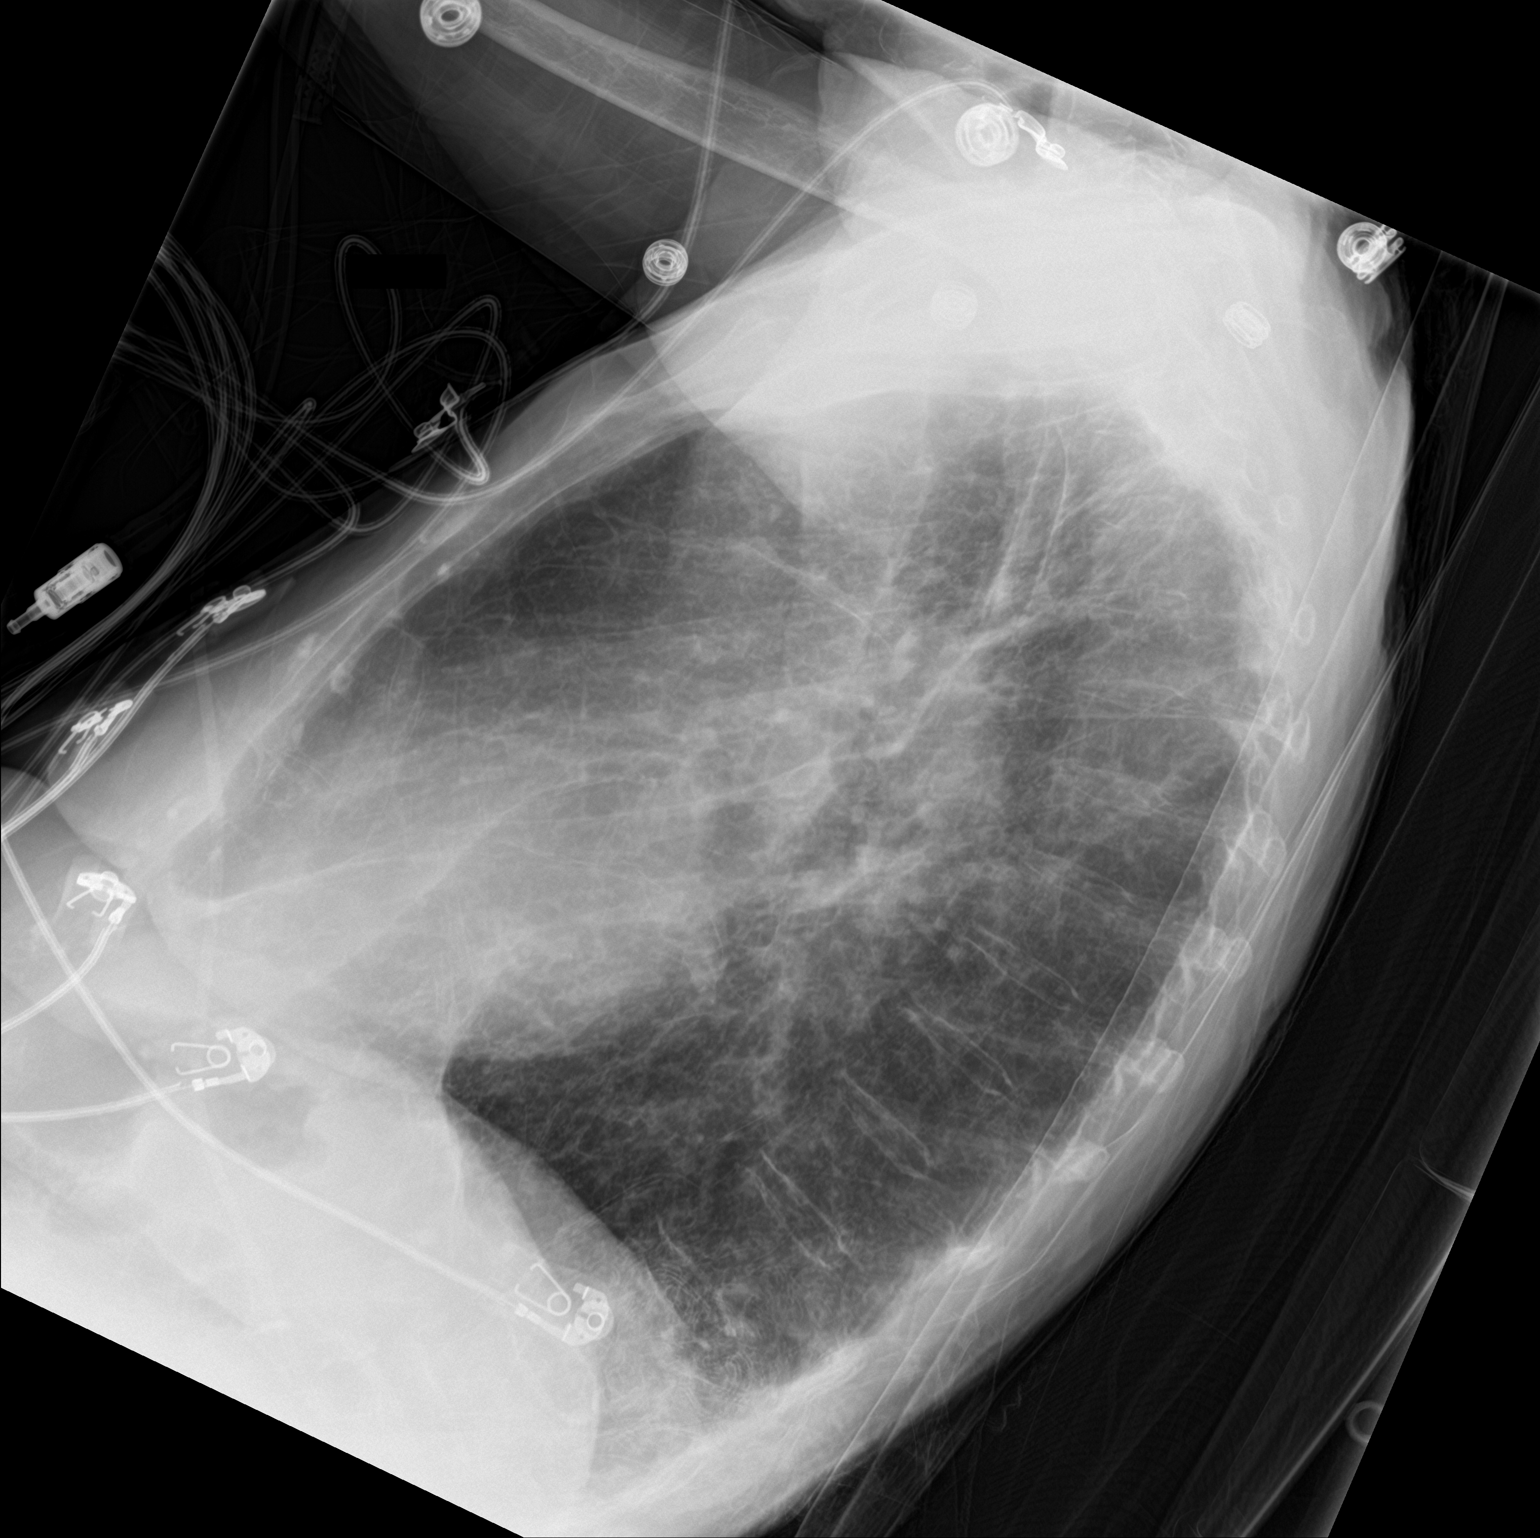

[chest ap]
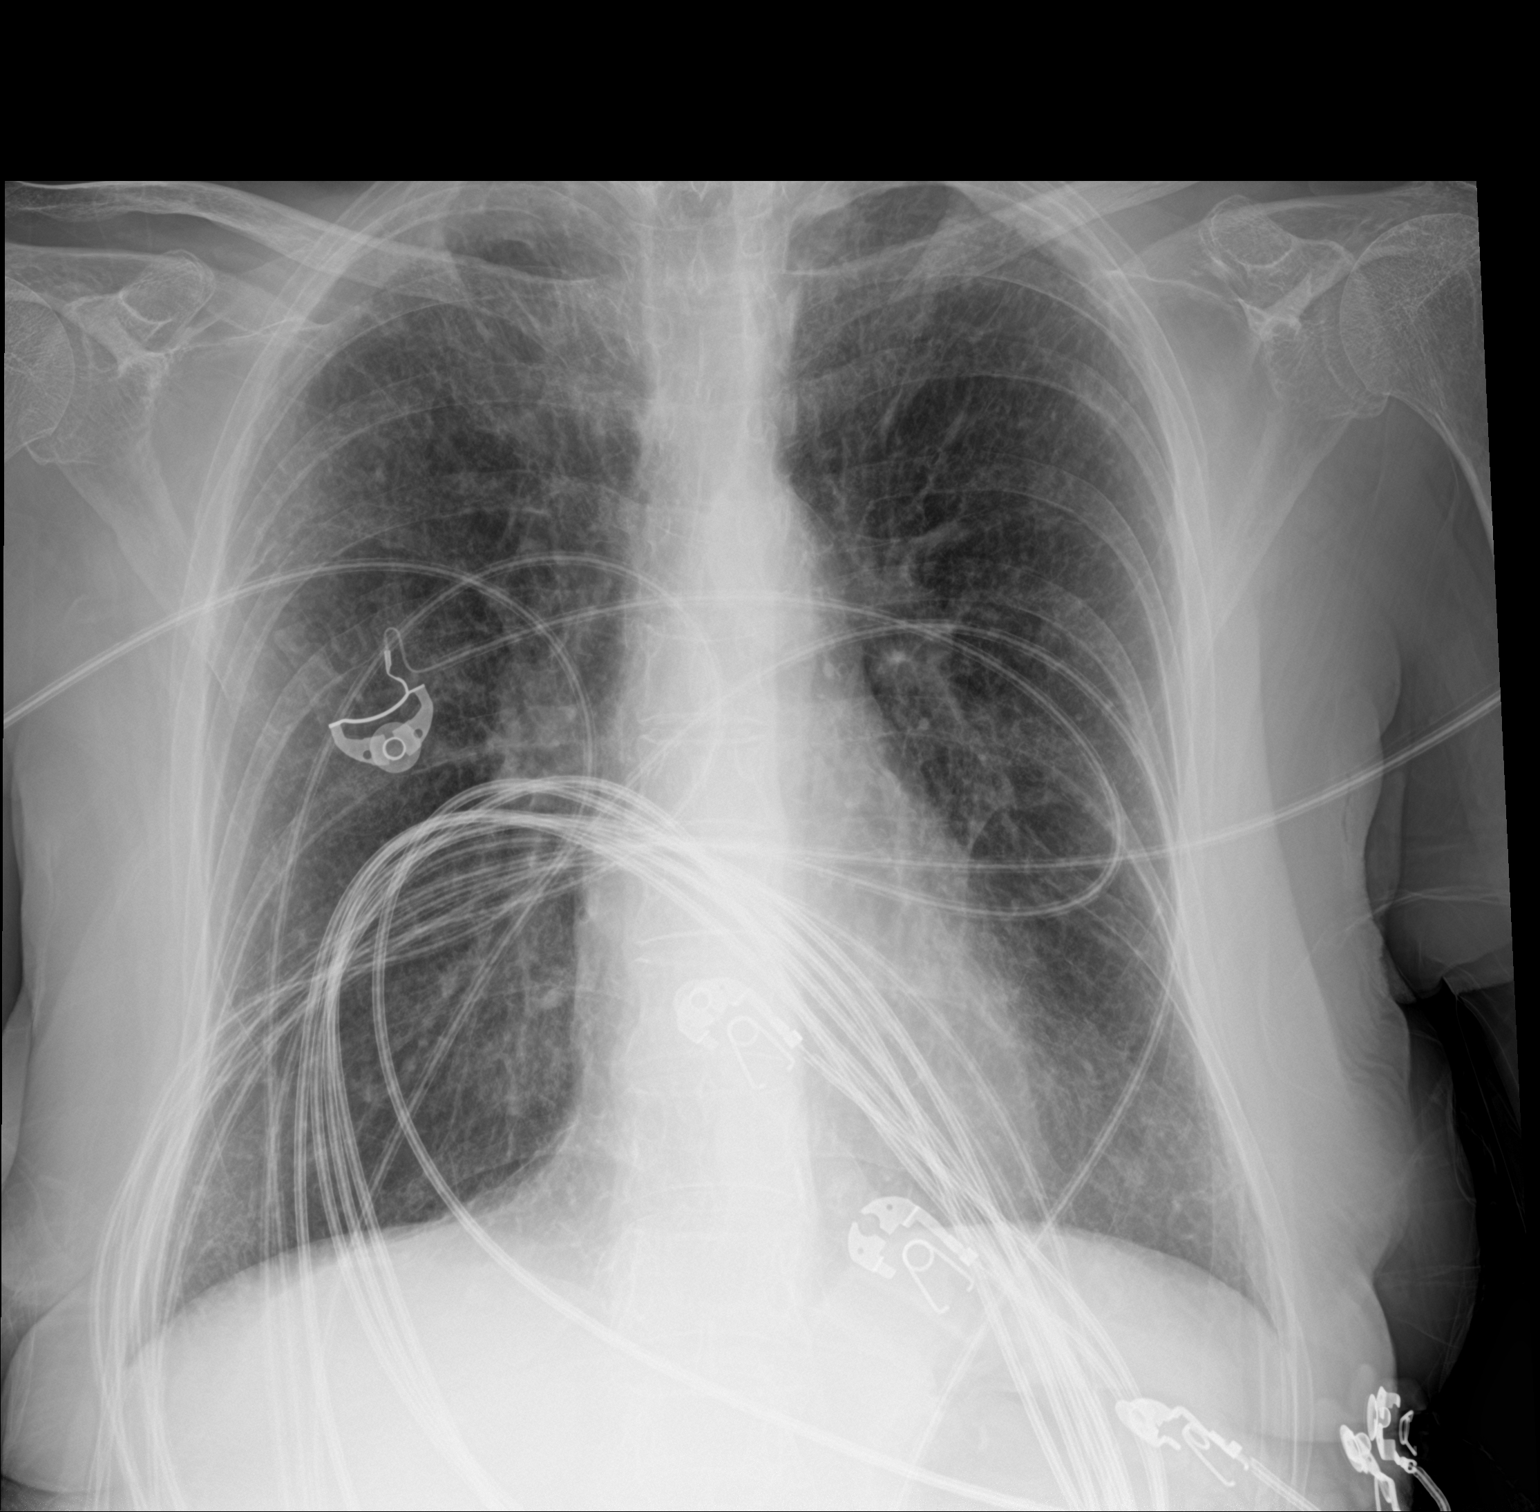

[2 of 2 positions shown; findings below may reference images not displayed]

FINDINGS: Irregularly thickened interstitial markings bilaterally are stable
from the recent prior CT, increased when compared to the prior chest
radiographs. There is upper lobe pleuroparenchymal scarring, also
stable. Lungs are hyperexpanded. No evidence of pneumonia. No
pulmonary edema.

Cardiac silhouette is normal in size. No mediastinal or hilar
masses. No convincing adenopathy.

Skeletal structures are demineralized but intact.
IMPRESSION: 1. No acute cardiopulmonary disease.
2. COPD and interstitial lung disease.

## 2019-04-14 IMAGING — DX DG CHEST 1V PORT
1 series · 1 of 1 positions shown · non-contrast
Comparison: Study obtained earlier in the day

CLINICAL DATA: Hypoxia

EXAM:
PORTABLE CHEST 1 VIEW

[chest ap]
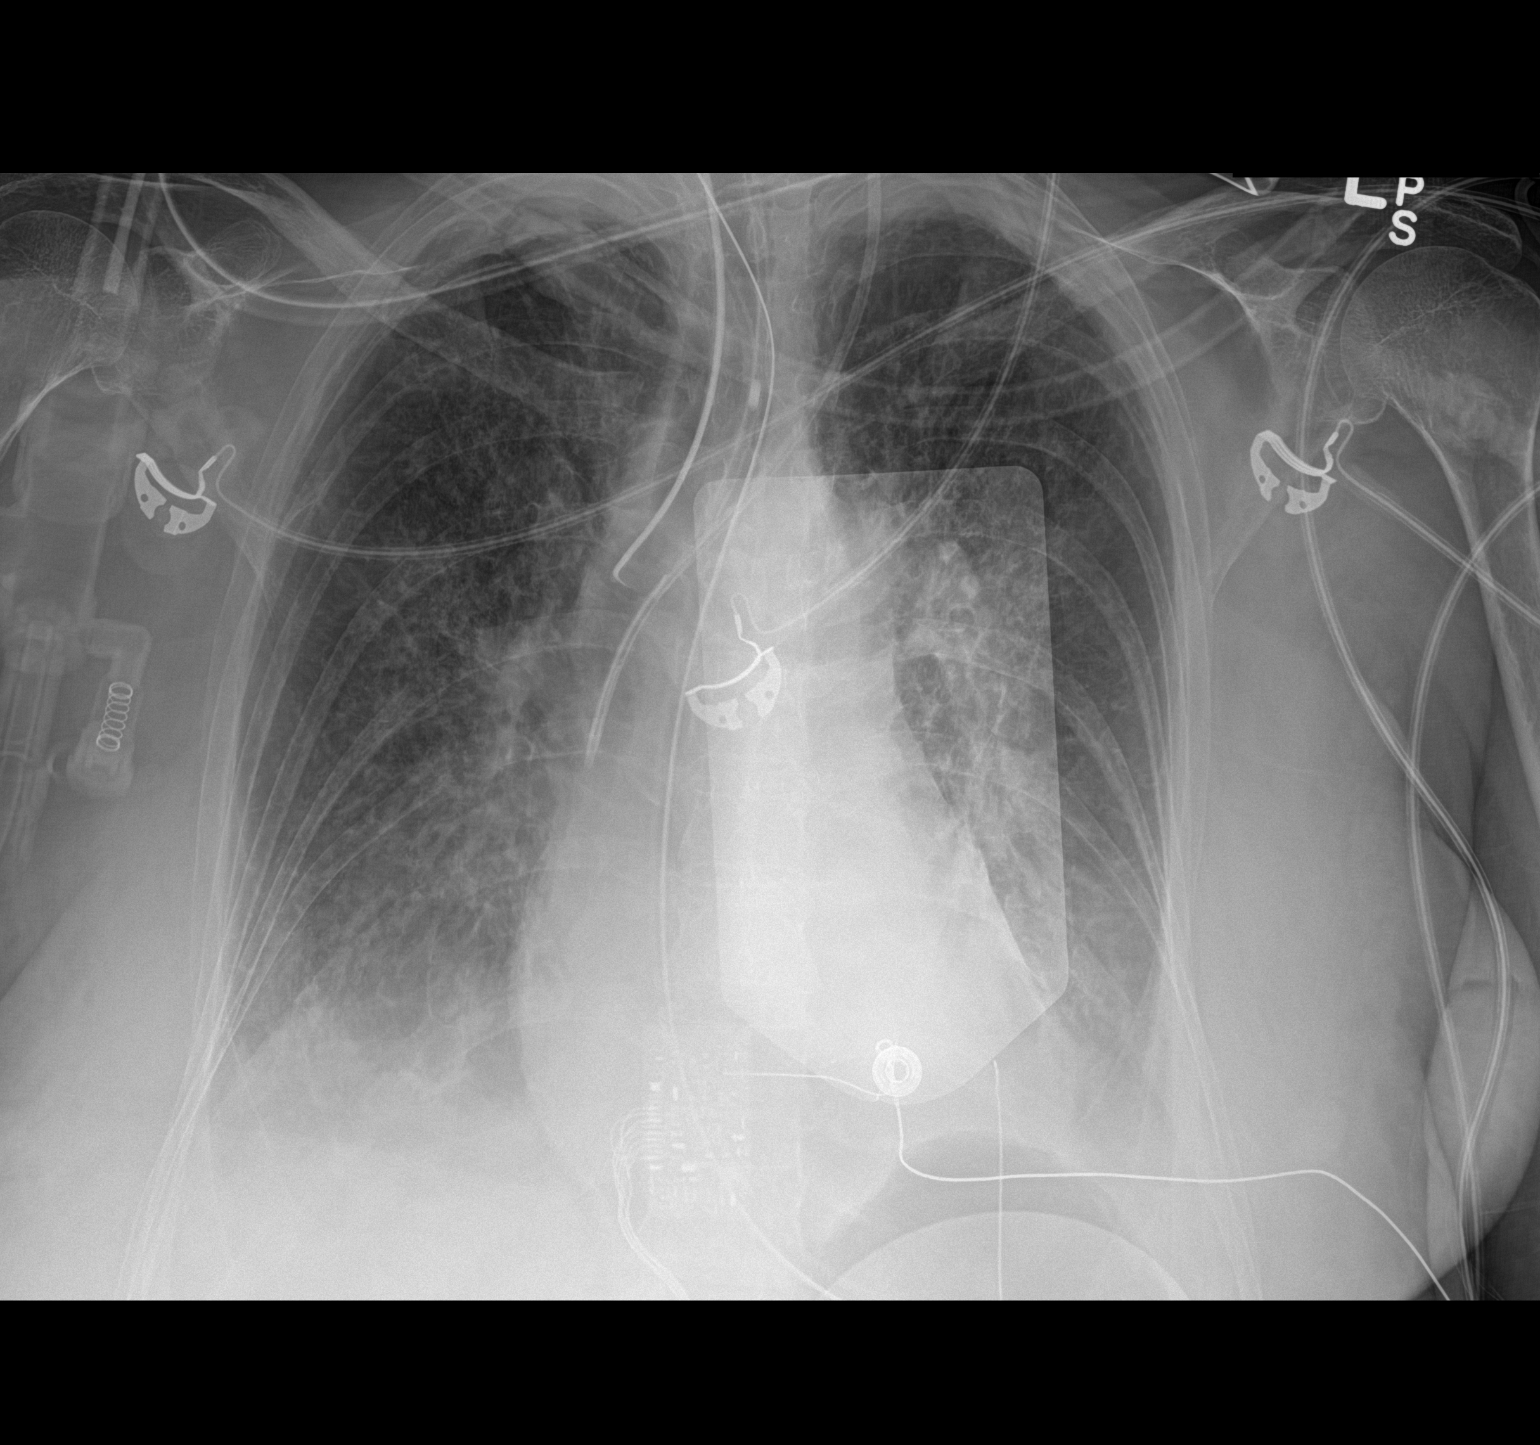

[1 of 1 positions shown; findings below may reference images not displayed]

FINDINGS: Endotracheal tube tip is at the carina with the tip directed toward
the right main bronchus. Left jugular catheter tip is in the
superior cava. Nasogastric tube tip and side port below the
diaphragm. No pneumothorax. There are small pleural effusions
bilaterally with mild interstitial edema. No consolidation
appreciable. Heart is mildly enlarged with pulmonary venous
hypertension. No evident adenopathy. No bone lesions.
IMPRESSION: Tube and catheter positions as described without pneumothorax. Note
that the endotracheal tube tip is at the carina directed toward the
right main bronchus. Advise withdrawing endotracheal tube
approximately 3 cm. Findings indicative of a degree of congestive
heart failure. No appreciable consolidation.

Critical Value/emergent results were called by telephone at the time
of interpretation on 04/26/2017 at [DATE] to Nga Kruse, RN ,
who verbally acknowledged these results.

## 2019-06-04 DIAGNOSIS — M810 Age-related osteoporosis without current pathological fracture: Secondary | ICD-10-CM | POA: Diagnosis not present

## 2019-06-04 DIAGNOSIS — R7301 Impaired fasting glucose: Secondary | ICD-10-CM | POA: Diagnosis not present

## 2019-06-04 DIAGNOSIS — Z23 Encounter for immunization: Secondary | ICD-10-CM | POA: Diagnosis not present

## 2019-06-04 DIAGNOSIS — E7849 Other hyperlipidemia: Secondary | ICD-10-CM | POA: Diagnosis not present

## 2019-06-11 DIAGNOSIS — R82998 Other abnormal findings in urine: Secondary | ICD-10-CM | POA: Diagnosis not present

## 2019-06-29 ENCOUNTER — Ambulatory Visit: Payer: Medicare HMO

## 2019-07-06 ENCOUNTER — Ambulatory Visit
Admission: RE | Admit: 2019-07-06 | Discharge: 2019-07-06 | Disposition: A | Discharge status: Home / Self Care | Payer: Medicare HMO | Source: Ambulatory Visit | Attending: Internal Medicine | Admitting: Internal Medicine

## 2019-07-06 DIAGNOSIS — Z1212 Encounter for screening for malignant neoplasm of rectum: Secondary | ICD-10-CM | POA: Diagnosis not present

## 2019-07-06 DIAGNOSIS — F17218 Nicotine dependence, cigarettes, with other nicotine-induced disorders: Secondary | ICD-10-CM

## 2019-07-06 DIAGNOSIS — F17210 Nicotine dependence, cigarettes, uncomplicated: Secondary | ICD-10-CM | POA: Diagnosis not present

## 2019-09-03 DIAGNOSIS — I739 Peripheral vascular disease, unspecified: Secondary | ICD-10-CM | POA: Diagnosis not present

## 2019-09-03 DIAGNOSIS — M810 Age-related osteoporosis without current pathological fracture: Secondary | ICD-10-CM | POA: Diagnosis not present

## 2019-09-03 DIAGNOSIS — I700 Atherosclerosis of aorta: Secondary | ICD-10-CM | POA: Diagnosis not present

## 2019-09-03 DIAGNOSIS — J449 Chronic obstructive pulmonary disease, unspecified: Secondary | ICD-10-CM | POA: Diagnosis not present

## 2019-09-03 DIAGNOSIS — E785 Hyperlipidemia, unspecified: Secondary | ICD-10-CM | POA: Diagnosis not present

## 2019-09-03 DIAGNOSIS — G629 Polyneuropathy, unspecified: Secondary | ICD-10-CM | POA: Diagnosis not present

## 2019-09-03 DIAGNOSIS — I2510 Atherosclerotic heart disease of native coronary artery without angina pectoris: Secondary | ICD-10-CM | POA: Diagnosis not present

## 2019-09-03 DIAGNOSIS — I5020 Unspecified systolic (congestive) heart failure: Secondary | ICD-10-CM | POA: Diagnosis not present

## 2019-09-03 DIAGNOSIS — R7301 Impaired fasting glucose: Secondary | ICD-10-CM | POA: Diagnosis not present

## 2019-11-13 DIAGNOSIS — Z20828 Contact with and (suspected) exposure to other viral communicable diseases: Secondary | ICD-10-CM | POA: Diagnosis not present

## 2019-11-13 DIAGNOSIS — U071 COVID-19: Secondary | ICD-10-CM | POA: Diagnosis not present

## 2019-11-26 ENCOUNTER — Telehealth: Payer: Self-pay

## 2019-11-26 ENCOUNTER — Encounter: Payer: Self-pay | Admitting: Cardiology

## 2019-11-26 ENCOUNTER — Telehealth (INDEPENDENT_AMBULATORY_CARE_PROVIDER_SITE_OTHER): Payer: Medicare HMO | Admitting: Cardiology

## 2019-11-26 VITALS — Ht 59.0 in | Wt 91.0 lb

## 2019-11-26 DIAGNOSIS — Z8619 Personal history of other infectious and parasitic diseases: Secondary | ICD-10-CM

## 2019-11-26 DIAGNOSIS — E785 Hyperlipidemia, unspecified: Secondary | ICD-10-CM

## 2019-11-26 DIAGNOSIS — E441 Mild protein-calorie malnutrition: Secondary | ICD-10-CM

## 2019-11-26 DIAGNOSIS — Z720 Tobacco use: Secondary | ICD-10-CM | POA: Diagnosis not present

## 2019-11-26 DIAGNOSIS — R9430 Abnormal result of cardiovascular function study, unspecified: Secondary | ICD-10-CM

## 2019-11-26 NOTE — Progress Notes (Signed)
Virtual Visit via Telephone Note   This visit type was conducted due to national recommendations for restrictions regarding the COVID-19 Pandemic (e.g. social distancing) in an effort to limit this patient's exposure and mitigate transmission in our community.  Due to her co-morbid illnesses, this patient is at least at moderate risk for complications without adequate follow up.  This format is felt to be most appropriate for this patient at this time.  The patient did not have access to video technology/had technical difficulties with video requiring transitioning to audio format only (telephone).  All issues noted in this document were discussed and addressed.  No physical exam could be performed with this format.  Please refer to the patient's chart for her  consent to telehealth for [facility].   Date:  11/26/2019   ID:  Alexandra House, DOB August 28, 1946, MRN [id number]  Patient Location: Home Provider Location: Home  PCP:  Marton Redwood, MD  Cardiologist:  Dr Stanford Breed Electrophysiologist:  None   Evaluation Performed:  Follow-Up Visit  Chief Complaint:  none  History of Present Illness:    Alexandra House is a 74 y.o. female with a history of an abnormal Myoview in 2013 which demonstrated no ischemia and an EF of 81% but she did develop significant ST abnormalities consistent with ischemia and transient complete heart block with infusion. She has been treated medically with low threshold for cath if recurrent symptoms. A Repeat Myoview was done in 2019 and was low risk.  Echo showed normal LVF.   In July 2018 she was admitted for two weeks with sepsis and shock complicated by respiratory failure, renal failure, and transient bradycardia. She was discharged to SNF but is now back home. She was contacted today for routine follow up.  She and her husband have been isolating at their house in [facility].  She denies chest pain or unusual dyspnea.   The patient does not have  symptoms concerning for COVID-19 infection (fever, chills, cough, or new shortness of breath).    Past Medical History:  Diagnosis Date  . Acute on chronic respiratory failure ([place])   . Anxiety   . Caloric malnutrition ([place])   . Carotid stenosis   . Coronary artery disease   . Fibromyalgia   . HLD (hyperlipidemia)   . Hx of cardiovascular stress test    a. Lex MV 11/13: + significant ECG changes and CHB with infusion; EF 81%, no ischemia,   . Hx of echocardiogram    a. Echo 12/12: EF 58-09%, grade 2 diastolic dysfunction, mild MR.; b. Echo 11/13:  mild LVH, EF 60-65%  . Hypertension   . Neuropathy   . Osteoporosis   . Syncope 08/2009   a. ETT-Echo 1/11:  poor ex tol, submax exercise, no WMA or ECG changes c/w ischemia;     Past Surgical History:  Procedure Laterality Date  . None       Current Meds  Medication Sig  . alendronate (FOSAMAX) 70 MG tablet   . aspirin EC 81 MG tablet Take 81 mg by mouth daily.  . benzonatate (TESSALON) 100 MG capsule Take 100 mg by mouth every 8 (eight) hours as needed for cough.  Marland Kitchen BREO ELLIPTA 100-25 MCG/INH AEPB Inhale 1 puff into the lungs daily.  Marland Kitchen ENSURE PLUS (ENSURE PLUS) LIQD Take 237 mLs by mouth daily.  Marland Kitchen EPINEPHrine (EPIPEN JR) 0.15 MG/0.3ML injection Inject 15 mg into the muscle as needed.  . ergocalciferol (VITAMIN D2) 50000 units  capsule Take 50,000 Units by mouth once a week.  . gabapentin (NEURONTIN) 100 MG capsule daily.  Marland Kitchen guaiFENesin (MUCINEX) 600 MG 12 hr tablet Take by mouth as needed.  Marland Kitchen ibuprofen (ADVIL,MOTRIN) 200 MG tablet Take 200-400 mg by mouth every 6 (six) hours as needed for mild pain.   Marland Kitchen LORazepam (ATIVAN) 1 MG tablet Take 1 mg by mouth every 8 (eight) hours.  . Multiple Vitamins-Minerals (MULTIVITAMIN GUMMIES ADULT PO) Take 2 tablets by mouth daily.  . rosuvastatin (CRESTOR) 10 MG tablet Take 1 tablet by mouth at bedtime.     Allergies:   Bee venom   Social History   Tobacco Use  . Smoking status: Current  Every Day Smoker    Packs/day: 0.50    Types: Cigarettes  . Smokeless tobacco: Current User  Substance Use Topics  . Alcohol use: Yes    Comment: glass wine once every 6 mos. per patient  . Drug use: No     Family Hx: The patient's family history includes Breast cancer in her mother; CAD (age of onset: 51) in her mother; Lung cancer in her father. There is no history of Colon cancer.  ROS:   Please see the history of present illness.    All other systems reviewed and are negative.   Prior CV studies:   The following studies were reviewed today: Myoview Sept 2019 Echo sept 2019  Labs/Other Tests and Data Reviewed:    EKG:  No ECG reviewed.  Recent Labs: No results found for requested labs within last 8760 hours.   Recent Lipid Panel Lab Results  Component Value Date/Time   CHOL 160 11/12/2017 12:03 PM   TRIG 81 11/12/2017 12:03 PM   HDL 54 11/12/2017 12:03 PM   CHOLHDL 3.0 11/12/2017 12:03 PM   [place] 90 11/12/2017 12:03 PM    Wt Readings from Last 3 Encounters:  11/26/19 91 lb (41.3 kg)  09/08/18 84 lb (38.1 kg)  08/27/18 83 lb 11.2 oz (38 kg)     Objective:    Vital Signs:  Ht 4\' 11"  (1.499 m)   Wt 91 lb (41.3 kg)   LMP  (LMP Unknown)   BMI 18.38 kg/m    VITAL SIGNS:  reviewed  ASSESSMENT & PLAN:     Nonspecific abnormal results of cardiovascular function study Lexiscan Myoview 1497 complicated by transient CHB- low threshold for cath but no symptoms of angina. Repeat Myoview Sept 2019 was low risk.   Dyslipidemia On Crestor -followed by PCP  Tobacco abuse Still smoking-1/2 PPD  Carotid stenosis, bilateral Mild  Malnutrition ([place]) Pt weighs 91 lbs  COVID-19 Education: The signs and symptoms of COVID-19 were discussed with the patient and how to seek care for testing (follow up with PCP or arrange E-visit).  The importance of social distancing was discussed today.  Time:   Today, I have spent 10 minutes with the patient with telehealth  technology discussing the above problems.     Medication Adjustments/Labs and Tests Ordered: Current medicines are reviewed at length with the patient today.  Concerns regarding medicines are outlined above.   Tests Ordered: No orders of the defined types were placed in this encounter.   Medication Changes: No orders of the defined types were placed in this encounter.   Follow Up:  In Person Dr Stanford Breed in one year  Signed, Kerin Ransom, Hershal Coria  11/26/2019 4:21 PM    [place]

## 2019-11-26 NOTE — Patient Instructions (Signed)
Medication Instructions:  Your physician recommends that you continue on your current medications as directed. Please refer to the Current Medication list given to you today. *If you need a refill on your cardiac medications before your next appointment, please call your pharmacy*  Lab Work: None  If you have labs (blood work) drawn today and your tests are completely normal, you will receive your results only by: Marland Kitchen MyChart Message (if you have MyChart) OR . A paper copy in the mail If you have any lab test that is abnormal or we need to change your treatment, we will call you to review the results.  Testing/Procedures: None   Follow-Up: At [facility], you and your health needs are our priority.  As part of our continuing mission to provide you with exceptional heart care, we have created designated Provider Care Teams.  These Care Teams include your primary Cardiologist (physician) and Advanced Practice Providers (APPs -  Physician Assistants and Nurse Practitioners) who all work together to provide you with the care you need, when you need it.  Your next appointment:   12 month(s)  The format for your next appointment:   In Person  Provider:   Kirk Ruths, MD  Other Instructions

## 2019-11-26 NOTE — Telephone Encounter (Signed)
Contacted patient to discuss AVS Instructions. Gave patient Alexandra House's recommendations from today's virtual office visit. Informed patient that someone from the scheduling dept will be in contact with them to schedule their follow up appt. Patient voiced understanding; AVS printed and mailed to patient.    

## 2019-12-20 ENCOUNTER — Ambulatory Visit: Payer: Medicare HMO | Attending: Internal Medicine

## 2019-12-20 DIAGNOSIS — Z23 Encounter for immunization: Secondary | ICD-10-CM

## 2019-12-20 NOTE — Progress Notes (Signed)
   Covid-19 Vaccination Clinic  Name:  Alexandra House    MRN: [id number] DOB: 03-16-1946  12/20/2019  [name] was observed post Covid-19 immunization for 30 minutes based on pre-vaccination screening without incident. She was provided with Vaccine Information Sheet and instruction to access the V-Safe system.   [name] was instructed to call 911 with any severe reactions post vaccine: Marland Kitchen Difficulty breathing  . Swelling of face and throat  . A fast heartbeat  . A bad rash all over body  . Dizziness and weakness   Immunizations Administered    Name Date Dose VIS Date Route   Pfizer COVID-19 Vaccine 12/20/2019  9:13 AM 0.3 mL 09/25/2019 Intramuscular   Manufacturer: [place]   Lot: [id number]   [place]: [id number]

## 2020-01-19 ENCOUNTER — Ambulatory Visit: Payer: Medicare HMO | Attending: Internal Medicine

## 2020-01-19 DIAGNOSIS — Z23 Encounter for immunization: Secondary | ICD-10-CM

## 2020-01-19 NOTE — Progress Notes (Signed)
   Covid-19 Vaccination Clinic  Name:  Alexandra House    MRN: [id number] DOB: May 18, 1946  01/19/2020  [name] was observed post Covid-19 immunization for 30 minutes based on pre-vaccination screening without incident. She was provided with Vaccine Information Sheet and instruction to access the V-Safe system.   [name] was instructed to call 911 with any severe reactions post vaccine: Marland Kitchen Difficulty breathing  . Swelling of face and throat  . A fast heartbeat  . A bad rash all over body  . Dizziness and weakness   Immunizations Administered    Name Date Dose VIS Date Route   Pfizer COVID-19 Vaccine 01/19/2020 10:48 AM 0.3 mL 09/25/2019 Intramuscular   Manufacturer: [employer], [employer]   Lot: [id number]   [place]: [id number]

## 2020-02-24 DIAGNOSIS — M810 Age-related osteoporosis without current pathological fracture: Secondary | ICD-10-CM | POA: Diagnosis not present

## 2020-02-24 DIAGNOSIS — E559 Vitamin D deficiency, unspecified: Secondary | ICD-10-CM | POA: Diagnosis not present

## 2020-02-29 DIAGNOSIS — M810 Age-related osteoporosis without current pathological fracture: Secondary | ICD-10-CM | POA: Diagnosis not present

## 2020-02-29 DIAGNOSIS — Z0000 Encounter for general adult medical examination without abnormal findings: Secondary | ICD-10-CM | POA: Diagnosis not present

## 2020-02-29 DIAGNOSIS — E7849 Other hyperlipidemia: Secondary | ICD-10-CM | POA: Diagnosis not present

## 2020-02-29 DIAGNOSIS — R7301 Impaired fasting glucose: Secondary | ICD-10-CM | POA: Diagnosis not present

## 2020-02-29 LAB — LIPID PANEL
Cholesterol: 252 — AB (ref 0–200)
HDL: 60 (ref 35–70)
LDL Cholesterol: 173
LDl/HDL Ratio: 2.9
Triglycerides: 95 (ref 40–160)

## 2020-02-29 LAB — VITAMIN D 25 HYDROXY (VIT D DEFICIENCY, FRACTURES): Vit D, 25-Hydroxy: 11.6

## 2020-02-29 LAB — BASIC METABOLIC PANEL
BUN: 15 (ref 4–21)
CO2: 24 — AB (ref 13–22)
Chloride: 104 (ref 99–108)
Creatinine: 1.1 (ref 0.5–1.1)
Glucose: 91
Potassium: 4.7 (ref 3.4–5.3)
Sodium: 133 — AB (ref 137–147)

## 2020-02-29 LAB — CBC: RBC: 4.1 (ref 3.87–5.11)

## 2020-02-29 LAB — TSH: TSH: 3.39 (ref 0.41–5.90)

## 2020-02-29 LAB — CBC AND DIFFERENTIAL
HCT: 37 (ref 36–46)
Hemoglobin: 12.2 (ref 12.0–16.0)
Platelets: 300 (ref 150–399)
WBC: 10.8

## 2020-02-29 LAB — HEPATIC FUNCTION PANEL
ALT: 10 (ref 7–35)
AST: 18 (ref 13–35)
Alkaline Phosphatase: 71 (ref 25–125)
Bilirubin, Total: 0.4

## 2020-02-29 LAB — HEMOGLOBIN A1C: Hemoglobin A1C: 5

## 2020-02-29 LAB — COMPREHENSIVE METABOLIC PANEL
Albumin: 3.6 (ref 3.5–5.0)
Calcium: 8.7 (ref 8.7–10.7)

## 2020-03-07 DIAGNOSIS — Z1331 Encounter for screening for depression: Secondary | ICD-10-CM | POA: Diagnosis not present

## 2020-03-07 DIAGNOSIS — R7301 Impaired fasting glucose: Secondary | ICD-10-CM | POA: Diagnosis not present

## 2020-03-07 DIAGNOSIS — I739 Peripheral vascular disease, unspecified: Secondary | ICD-10-CM | POA: Diagnosis not present

## 2020-03-07 DIAGNOSIS — J449 Chronic obstructive pulmonary disease, unspecified: Secondary | ICD-10-CM | POA: Diagnosis not present

## 2020-03-07 DIAGNOSIS — I6523 Occlusion and stenosis of bilateral carotid arteries: Secondary | ICD-10-CM | POA: Diagnosis not present

## 2020-03-07 DIAGNOSIS — E785 Hyperlipidemia, unspecified: Secondary | ICD-10-CM | POA: Diagnosis not present

## 2020-03-07 DIAGNOSIS — I700 Atherosclerosis of aorta: Secondary | ICD-10-CM | POA: Diagnosis not present

## 2020-03-07 DIAGNOSIS — I2510 Atherosclerotic heart disease of native coronary artery without angina pectoris: Secondary | ICD-10-CM | POA: Diagnosis not present

## 2020-03-07 DIAGNOSIS — Z0000 Encounter for general adult medical examination without abnormal findings: Secondary | ICD-10-CM | POA: Diagnosis not present

## 2020-03-07 DIAGNOSIS — I5020 Unspecified systolic (congestive) heart failure: Secondary | ICD-10-CM | POA: Diagnosis not present

## 2020-03-15 ENCOUNTER — Other Ambulatory Visit (HOSPITAL_COMMUNITY): Payer: Self-pay | Admitting: *Deleted

## 2020-03-16 ENCOUNTER — Other Ambulatory Visit: Payer: Self-pay

## 2020-03-16 ENCOUNTER — Encounter (HOSPITAL_COMMUNITY)
Admission: RE | Admit: 2020-03-16 | Discharge: 2020-03-16 | Disposition: A | Discharge status: Home / Self Care | Payer: Medicare HMO | Source: Ambulatory Visit | Attending: Internal Medicine | Admitting: Internal Medicine

## 2020-03-16 DIAGNOSIS — M810 Age-related osteoporosis without current pathological fracture: Secondary | ICD-10-CM | POA: Insufficient documentation

## 2020-03-16 MED ORDER — DENOSUMAB 60 MG/ML ~~LOC~~ SOSY
PREFILLED_SYRINGE | SUBCUTANEOUS | Status: AC
  Filled 2020-03-16: qty 1

## 2020-03-16 MED ORDER — DENOSUMAB 60 MG/ML ~~LOC~~ SOSY
60.0000 mg | PREFILLED_SYRINGE | Freq: Once | SUBCUTANEOUS | Status: AC
  Administered 2020-03-16: 60 mg via SUBCUTANEOUS

## 2020-08-15 DIAGNOSIS — L089 Local infection of the skin and subcutaneous tissue, unspecified: Secondary | ICD-10-CM | POA: Diagnosis not present

## 2020-08-15 DIAGNOSIS — R0781 Pleurodynia: Secondary | ICD-10-CM | POA: Diagnosis not present

## 2020-08-15 DIAGNOSIS — S90811A Abrasion, right foot, initial encounter: Secondary | ICD-10-CM | POA: Diagnosis not present

## 2020-10-27 DIAGNOSIS — Z23 Encounter for immunization: Secondary | ICD-10-CM | POA: Diagnosis not present

## 2020-10-27 DIAGNOSIS — S90511D Abrasion, right ankle, subsequent encounter: Secondary | ICD-10-CM | POA: Diagnosis not present

## 2020-10-27 DIAGNOSIS — L299 Pruritus, unspecified: Secondary | ICD-10-CM | POA: Diagnosis not present

## 2020-12-06 ENCOUNTER — Other Ambulatory Visit: Payer: Self-pay

## 2020-12-06 ENCOUNTER — Ambulatory Visit (INDEPENDENT_AMBULATORY_CARE_PROVIDER_SITE_OTHER): Payer: Medicare HMO

## 2020-12-06 ENCOUNTER — Ambulatory Visit (INDEPENDENT_AMBULATORY_CARE_PROVIDER_SITE_OTHER): Payer: Medicare HMO | Admitting: Internal Medicine

## 2020-12-06 ENCOUNTER — Encounter: Payer: Self-pay | Admitting: Internal Medicine

## 2020-12-06 VITALS — BP 106/64 | HR 68 | Temp 97.6°F | Resp 16 | Ht 59.0 in | Wt 86.0 lb

## 2020-12-06 DIAGNOSIS — E785 Hyperlipidemia, unspecified: Secondary | ICD-10-CM | POA: Diagnosis not present

## 2020-12-06 DIAGNOSIS — R053 Chronic cough: Secondary | ICD-10-CM | POA: Diagnosis not present

## 2020-12-06 DIAGNOSIS — R058 Other specified cough: Secondary | ICD-10-CM | POA: Insufficient documentation

## 2020-12-06 DIAGNOSIS — Z720 Tobacco use: Secondary | ICD-10-CM

## 2020-12-06 DIAGNOSIS — E559 Vitamin D deficiency, unspecified: Secondary | ICD-10-CM

## 2020-12-06 DIAGNOSIS — I2510 Atherosclerotic heart disease of native coronary artery without angina pectoris: Secondary | ICD-10-CM

## 2020-12-06 DIAGNOSIS — J411 Mucopurulent chronic bronchitis: Secondary | ICD-10-CM

## 2020-12-06 DIAGNOSIS — J439 Emphysema, unspecified: Secondary | ICD-10-CM | POA: Diagnosis not present

## 2020-12-06 DIAGNOSIS — I6523 Occlusion and stenosis of bilateral carotid arteries: Secondary | ICD-10-CM

## 2020-12-06 DIAGNOSIS — R059 Cough, unspecified: Secondary | ICD-10-CM | POA: Diagnosis not present

## 2020-12-06 MED ORDER — ROSUVASTATIN CALCIUM 10 MG PO TABS: 10.0000 mg | ORAL_TABLET | Freq: Every day | ORAL | 1 refills | Status: DC

## 2020-12-06 MED ORDER — BREO ELLIPTA 100-25 MCG/INH IN AEPB: 1.0000 | INHALATION_SPRAY | Freq: Every day | RESPIRATORY_TRACT | 1 refills | Status: DC

## 2020-12-06 MED ORDER — CHOLECALCIFEROL 125 MCG (5000 UT) PO CAPS: 5000.0000 [IU] | ORAL_CAPSULE | Freq: Every day | ORAL | 1 refills | Status: DC

## 2020-12-06 NOTE — Patient Instructions (Signed)

## 2020-12-06 NOTE — Progress Notes (Signed)
Subjective:  Patient ID: Alexandra House, female    DOB: 09/15/1946  Age: 75 y.o. MRN: [id number]  CC: COPD  This visit occurred during the SARS-CoV-2 public health emergency.  Safety protocols were in place, including screening questions prior to the visit, additional usage of staff PPE, and extensive cleaning of exam room while observing appropriate contact time as indicated for disinfecting solutions.    HPI Alexandra House presents for establishing.  She has a history of COPD and continues to smoke.  She is using an outdated LAMA/LAMA inhaler.  She gets some symptom relief with it.  She complains of cough, shortness of breath, and rare wheezing.  The cough is productive of clear phlegm.  She denies chest pain, diaphoresis, or weight loss.  History Alexandra House has a past medical history of Acute on chronic respiratory failure ([place]), Anxiety, Caloric malnutrition ([place]), Carotid stenosis, Coronary artery disease, Fibromyalgia, HLD (hyperlipidemia), cardiovascular stress test, echocardiogram, Hypertension, Neuropathy, Osteoporosis, and Syncope (08/2009).   She has a past surgical history that includes None.   Her family history includes Breast cancer in her mother; CAD (age of onset: 20) in her mother; Lung cancer in her father.She reports that she has been smoking cigarettes. She has been smoking about 0.50 packs per day. She uses smokeless tobacco. She reports current alcohol use. She reports that she does not use drugs.  Outpatient Medications Prior to Visit  Medication Sig Dispense Refill   alendronate (FOSAMAX) 70 MG tablet   11   aspirin EC 81 MG tablet Take 81 mg by mouth daily.     benzonatate (TESSALON) 100 MG capsule Take 100 mg by mouth every 8 (eight) hours as needed for cough.  0   EPINEPHrine (EPIPEN JR) 0.15 MG/0.3ML injection Inject 15 mg into the muscle as needed.  0   ergocalciferol (VITAMIN D2) 50000 units capsule Take 50,000 Units by mouth once a week.     guaiFENesin  (MUCINEX) 600 MG 12 hr tablet Take by mouth as needed.     LORazepam (ATIVAN) 1 MG tablet Take 1 mg by mouth every 8 (eight) hours.     Multiple Vitamins-Minerals (MULTIVITAMIN GUMMIES ADULT PO) Take 2 tablets by mouth daily.     BREO ELLIPTA 100-25 MCG/INH AEPB Inhale 1 puff into the lungs daily.  1   ENSURE PLUS (ENSURE PLUS) LIQD Take 237 mLs by mouth daily.     gabapentin (NEURONTIN) 100 MG capsule daily.     ibuprofen (ADVIL,MOTRIN) 200 MG tablet Take 200-400 mg by mouth every 6 (six) hours as needed for mild pain.      rosuvastatin (CRESTOR) 10 MG tablet Take 1 tablet by mouth at bedtime.  11   No facility-administered medications prior to visit.    ROS Review of Systems  Constitutional: Negative.  Negative for appetite change, diaphoresis, fatigue and unexpected weight change.  HENT: Negative.   Respiratory: Positive for cough, shortness of breath and wheezing. Negative for stridor.   Cardiovascular: Negative for chest pain, palpitations and leg swelling.  Gastrointestinal: Negative for abdominal pain, constipation, diarrhea, nausea and vomiting.  Endocrine: Negative.   Genitourinary: Negative.  Negative for difficulty urinating.  Musculoskeletal: Negative for arthralgias and myalgias.  Skin: Negative.  Negative for color change.  Neurological: Negative.  Negative for dizziness, weakness and headaches.  Hematological: Negative for adenopathy. Does not bruise/bleed easily.  Psychiatric/Behavioral: Negative.     Objective:  BP 106/64    Pulse 68    Temp 97.6 F (36.4  C) (Oral)    Resp 16    Ht 4\' 11"  (1.499 m)    Wt 86 lb (39 kg)    LMP  (LMP Unknown)    SpO2 99%    BMI 17.37 kg/m   Physical Exam Vitals reviewed.  HENT:     Nose: Nose normal.     Mouth/Throat:     Mouth: Mucous membranes are moist.  Eyes:     General: No scleral icterus.    Conjunctiva/sclera: Conjunctivae normal.  Cardiovascular:     Rate and Rhythm: Normal rate and regular rhythm.     Heart  sounds: No murmur heard.   Pulmonary:     Effort: Pulmonary effort is normal.     Breath sounds: No stridor. No wheezing, rhonchi or rales.  Abdominal:     General: Abdomen is flat.     Palpations: There is no mass.     Tenderness: There is no abdominal tenderness. There is no guarding.  Musculoskeletal:        General: Normal range of motion.     Cervical back: Neck supple.     Right lower leg: No edema.     Left lower leg: No edema.  Lymphadenopathy:     Cervical: No cervical adenopathy.  Skin:    General: Skin is warm and dry.     Coloration: Skin is not pale.  Neurological:     General: No focal deficit present.     Mental Status: She is alert.  Psychiatric:        Mood and Affect: Mood normal.        Behavior: Behavior normal.     Lab Results  Component Value Date   WBC 10.8 02/29/2020   HGB 12.2 02/29/2020   HCT 37 02/29/2020   PLT 300 02/29/2020   GLUCOSE 89 05/08/2017   CHOL 252 (A) 02/29/2020   TRIG 95 02/29/2020   HDL 60 02/29/2020   LDLCALC 173 02/29/2020   ALT 10 02/29/2020   AST 18 02/29/2020   NA 133 (A) 02/29/2020   K 4.7 02/29/2020   CL 104 02/29/2020   CREATININE 1.1 02/29/2020   BUN 15 02/29/2020   CO2 24 (A) 02/29/2020   TSH 3.39 02/29/2020   INR 1.10 04/20/2017   HGBA1C 5.0 02/29/2020    DG Chest 2 View  Result Date: 12/07/2020 CLINICAL DATA:  75 year old female with chronic cough EXAM: CHEST - 2 VIEW COMPARISON:  CT 07/06/2019, chest x-ray 04/29/2017 FINDINGS: Cardiomediastinal silhouette unchanged, with improved visualization of the heart borders. Interval removal of the support apparatus. Pleuroparenchymal thickening at the bilateral lung apices. Stigmata of emphysema, with increased retrosternal airspace, flattened hemidiaphragms, increased AP diameter, and hyperinflation on the AP view. Reticular opacities of the lungs with coarsened interstitial markings. No confluent airspace disease. Degenerative changes of the spine. IMPRESSION:  Emphysema and chronic lung changes without definite evidence of acute cardiopulmonary disease. Electronically Signed   By: Corrie Mckusick D.O.   On: 12/07/2020 14:56    Assessment & Plan:   Alexandra House was seen today for copd.  Diagnoses and all orders for this visit:  Mucopurulent chronic bronchitis ([place])- Will update her prescription for Breo.  I have asked her to quit smoking. -     BREO ELLIPTA 100-25 MCG/INH AEPB; Inhale 1 puff into the lungs daily.  Hyperlipidemia with target LDL less than 130 -     rosuvastatin (CRESTOR) 10 MG tablet; Take 1 tablet (10 mg total) by mouth at bedtime.  Vitamin D deficiency disease -     Cholecalciferol 125 MCG (5000 UT) capsule; Take 1 capsule (5,000 Units total) by mouth daily.  Cough with sputum- Her chest x-ray is negative for mass or infiltrate. -     DG Chest 2 View; Future  Tobacco abuse  Carotid stenosis, bilateral  Coronary artery disease involving native heart without angina pectoris, unspecified vessel or lesion type- She has not had any angina recently. -     rosuvastatin (CRESTOR) 10 MG tablet; Take 1 tablet (10 mg total) by mouth at bedtime.   I have discontinued Enid Derry A. Asher's ibuprofen, Ensure Plus, and gabapentin. I have also changed her rosuvastatin. Additionally, I am having her start on Cholecalciferol. Lastly, I am having her maintain her benzonatate, Multiple Vitamins-Minerals (MULTIVITAMIN GUMMIES ADULT PO), aspirin EC, ergocalciferol, guaiFENesin, EPINEPHrine, LORazepam, alendronate, and Breo Ellipta.  Meds ordered this encounter  Medications   BREO ELLIPTA 100-25 MCG/INH AEPB    Sig: Inhale 1 puff into the lungs daily.    Dispense:  120 each    Refill:  1   Cholecalciferol 125 MCG (5000 UT) capsule    Sig: Take 1 capsule (5,000 Units total) by mouth daily.    Dispense:  90 capsule    Refill:  1   rosuvastatin (CRESTOR) 10 MG tablet    Sig: Take 1 tablet (10 mg total) by mouth at bedtime.    Dispense:  90  tablet    Refill:  1     Follow-up: Return in about 3 months (around 03/05/2021).  Scarlette Calico, MD

## 2020-12-08 ENCOUNTER — Encounter: Payer: Self-pay | Admitting: Internal Medicine

## 2021-01-02 ENCOUNTER — Telehealth: Payer: Self-pay | Admitting: Internal Medicine

## 2021-01-02 NOTE — Telephone Encounter (Signed)
benzonatate (TESSALON) 100 MG capsule LORazepam (ATIVAN) 1 MG tablet [id number] [address], [place] AT [facility] Phone:  [phone]  Fax:  [phone]     Last seen- 02.22.22  Next apt-n/a Requesting a refill

## 2021-01-04 ENCOUNTER — Other Ambulatory Visit: Payer: Self-pay | Admitting: Internal Medicine

## 2021-01-04 DIAGNOSIS — F411 Generalized anxiety disorder: Secondary | ICD-10-CM

## 2021-01-04 DIAGNOSIS — J411 Mucopurulent chronic bronchitis: Secondary | ICD-10-CM

## 2021-01-04 MED ORDER — BENZONATATE 100 MG PO CAPS: 100.0000 mg | ORAL_CAPSULE | Freq: Two times a day (BID) | ORAL | 0 refills | Status: DC | PRN

## 2021-01-04 MED ORDER — LORAZEPAM 1 MG PO TABS: 1.0000 mg | ORAL_TABLET | Freq: Three times a day (TID) | ORAL | 1 refills | Status: DC | PRN

## 2021-03-22 ENCOUNTER — Telehealth: Payer: Self-pay | Admitting: Internal Medicine

## 2021-03-22 NOTE — Telephone Encounter (Signed)
LVM for pt husband to rtn my call to schedule pt's AWV with [facility]. Please schedule AWV if pt calls the office.

## 2021-04-20 ENCOUNTER — Telehealth: Payer: Self-pay | Admitting: Internal Medicine

## 2021-04-20 NOTE — Telephone Encounter (Signed)
LVM for pt to rtn my all to schedule AWV with [facility]. Please schedule this appt if pt calls the office.  ?

## 2021-04-27 ENCOUNTER — Other Ambulatory Visit: Payer: Self-pay

## 2021-04-27 ENCOUNTER — Ambulatory Visit (INDEPENDENT_AMBULATORY_CARE_PROVIDER_SITE_OTHER): Payer: Medicare HMO

## 2021-04-27 VITALS — BP 110/70 | HR 67 | Temp 97.9°F | Ht 59.0 in | Wt 83.8 lb

## 2021-04-27 DIAGNOSIS — Z0000 Encounter for general adult medical examination without abnormal findings: Secondary | ICD-10-CM | POA: Diagnosis not present

## 2021-04-27 NOTE — Progress Notes (Signed)
Subjective:   Alexandra House is a 75 y.o. female who presents for Medicare Annual (Subsequent) preventive examination.  Review of Systems     Cardiac Risk Factors include: advanced age (>43men, >20 women);dyslipidemia;family history of premature cardiovascular disease     Objective:    Today's Vitals   04/27/21 1102 04/27/21 1106  BP:  110/70  Pulse:  67  Temp:  97.9 F (36.6 C)  SpO2:  98%  Weight:  83 lb 12.8 oz (38 kg)  Height:  4\' 11"  (1.499 m)  PainSc: 0-No pain 0-No pain   Body mass index is 16.93 kg/m.  Advanced Directives 04/27/2021 08/27/2018 04/20/2017 04/20/2017 04/14/2017  Does Patient Have a Medical Advance Directive? Yes No No No No  Type of Advance Directive Living will - - - -  Does patient want to make changes to medical advance directive? No - Patient declined - - - -  Would patient like information on creating a medical advance directive? - - No - Patient declined No - Patient declined -    Current Medications (verified) Outpatient Encounter Medications as of 04/27/2021  Medication Sig   aspirin EC 81 MG tablet Take 81 mg by mouth daily.   benzonatate (TESSALON) 100 MG capsule Take 1 capsule (100 mg total) by mouth 2 (two) times daily as needed for cough.   BREO ELLIPTA 100-25 MCG/INH AEPB Inhale 1 puff into the lungs daily.   Cholecalciferol 125 MCG (5000 UT) capsule Take 1 capsule (5,000 Units total) by mouth daily.   ergocalciferol (VITAMIN D2) 50000 units capsule Take 50,000 Units by mouth once a week.   guaiFENesin (MUCINEX) 600 MG 12 hr tablet Take by mouth as needed.   LORazepam (ATIVAN) 1 MG tablet Take 1 tablet (1 mg total) by mouth every 8 (eight) hours as needed for anxiety.   Multiple Vitamins-Minerals (MULTIVITAMIN GUMMIES ADULT PO) Take 2 tablets by mouth daily.   rosuvastatin (CRESTOR) 10 MG tablet Take 1 tablet (10 mg total) by mouth at bedtime.   alendronate (FOSAMAX) 70 MG tablet  (Patient not taking: Reported on 04/27/2021)   EPINEPHrine  (EPIPEN JR) 0.15 MG/0.3ML injection Inject 15 mg into the muscle as needed. (Patient not taking: Reported on 04/27/2021)   No facility-administered encounter medications on file as of 04/27/2021.    Allergies (verified) Bee venom and Lipitor [atorvastatin]   History: Past Medical History:  Diagnosis Date   Acute on chronic respiratory failure (HCC)    Anxiety    Caloric malnutrition (H[facility])    Carotid stenosis    Coronary artery disease    Fibromyalgia    HLD (hyperlipidemia)    Hx of cardiovascular stress test    a. Lex MV 11/13: + significant ECG changes and CHB with infusion; EF 81%, no ischemia,    Hx of echocardiogram    a. Echo 12/12: EF 28-41%, grade 2 diastolic dysfunction, mild MR.; b. Echo 11/13:  mild LVH, EF 60-65%   Hypertension    Neuropathy    Osteoporosis    Syncope 08/2009   a. ETT-Echo 1/11:  poor ex tol, submax exercise, no WMA or ECG changes c/w ischemia;     Past Surgical History:  Procedure Laterality Date   None     Family History  Problem Relation Age of Onset   CAD Mother 43       Status post PCI   Breast cancer Mother    Lung cancer Father    Colon cancer Neg Hx  Social History   Socioeconomic History   Marital status: Married    Spouse name: Not on file   Number of children: 2   Years of education: Not on file   Highest education level: Not on file  Occupational History   Not on file  Tobacco Use   Smoking status: Every Day    Packs/day: 0.50    Types: Cigarettes   Smokeless tobacco: Current  Vaping Use   Vaping Use: Never used  Substance and Sexual Activity   Alcohol use: Yes    Comment: glass wine once every 6 mos. per patient   Drug use: No   Sexual activity: Not on file  Other Topics Concern   Not on file  Social History Narrative   Lives with husband   Social Determinants of Health   Financial Resource Strain: Low Risk    Difficulty of Paying Living Expenses: Not hard at all  Food Insecurity: No Food Insecurity    Worried About Charity fundraiser in the Last Year: Never true   [place] in the Last Year: Never true  Transportation Needs: No Transportation Needs   Lack of Transportation (Medical): No   Lack of Transportation (Non-Medical): No  Physical Activity: Inactive   Days of Exercise per Week: 0 days   Minutes of Exercise per Session: 0 min  Stress: No Stress Concern Present   Feeling of Stress : Not at all  Social Connections: Socially Integrated   Frequency of Communication with Friends and Family: More than three times a week   Frequency of Social Gatherings with Friends and Family: More than three times a week   Attends Religious Services: More than 4 times per year   Active Member of [employer] or Organizations: Yes   Attends Music therapist: More than 4 times per year   Marital Status: Married    Tobacco Counseling Ready to quit: Not Answered Counseling given: Not Answered   Clinical Intake:  Pre-visit preparation completed: Yes  Pain : No/denies pain Pain Score: 0-No pain     BMI - recorded: 16.93 Nutritional Status: BMI <19  Underweight Nutritional Risks: None Diabetes: No  How often do you need to have someone help you when you read instructions, pamphlets, or other written materials from your doctor or pharmacy?: 1 - Never What is the last grade level you completed in school?: High School Graduate; some college  Diabetic? no  Interpreter Needed?: No  Information entered by :: Lisette Abu, LPN   Activities of Daily Living In your present state of health, do you have any difficulty performing the following activities: 04/27/2021 12/06/2020  Hearing? N N  Vision? N N  Difficulty concentrating or making decisions? N N  Walking or climbing stairs? N N  Dressing or bathing? N N  Doing errands, shopping? N N  Preparing Food and eating ? N -  Using the Toilet? N -  In the past six months, have you accidently leaked urine? Y -  Do you have  problems with loss of bowel control? N -  Managing your Medications? N -  Managing your Finances? N -  Housekeeping or managing your Housekeeping? N -  Some recent data might be hidden    Patient Care Team: Janith Lima, MD as PCP - General (Internal Medicine) Stanford Breed, Denice Bors, MD as Consulting Physician (Cardiology) Marica Otter, [place] as Consulting Physician (Optometry) Alanda Slim, Neena Rhymes, MD as Consulting Physician (Ophthalmology)  Indicate any recent  Medical Services you may have received from other than [facility] providers in the past year (date may be approximate).     Assessment:   This is a routine wellness examination for [place].  Hearing/Vision screen Hearing Screening - Comments:: Patient denied any hearing difficulty.  No hearing aids. Vision Screening - Comments:: Patient wear glasses for reading. Eye exams done yearly by Dr. Marica Otter.  Dietary issues and exercise activities discussed: Current Exercise Habits: The patient does not participate in regular exercise at present, Exercise limited by: cardiac condition(s)   Goals Addressed   None   Depression Screen PHQ 2/9 Scores 04/27/2021 12/06/2020  PHQ - 2 Score 0 0    Fall Risk Fall Risk  04/27/2021  Falls in the past year? 0  Number falls in past yr: 0  Injury with Fall? 0  Risk for fall due to : No Fall Risks  Follow up Falls evaluation completed    [place]:  Any stairs in or around the home? Yes  If so, are there any without handrails? No  Home free of loose throw rugs in walkways, pet beds, electrical cords, etc? Yes  Adequate lighting in your home to reduce risk of falls? Yes   ASSISTIVE DEVICES UTILIZED TO PREVENT FALLS:  Life alert? No  Use of a cane, walker or w/c? No  Grab bars in the bathroom? No  Shower chair or bench in shower? Yes  Elevated toilet seat or a handicapped toilet? Yes   TIMED UP AND GO:  Was the test performed? Yes .  Length of time  to ambulate 10 feet: 8 sec.   Gait steady and fast without use of assistive device  Cognitive Function:Normal cognitive status assessed by direct observation by this Nurse Health Advisor. No abnormalities found.          Immunizations Immunization History  Administered Date(s) Administered   Influenza-Unspecified 08/15/2018   PFIZER(Purple Top)SARS-COV-2 Vaccination 12/20/2019, 01/19/2020, 10/28/2020    TDAP status: Due, Education has been provided regarding the importance of this vaccine. Advised may receive this vaccine at local pharmacy or Health Dept. Aware to provide a copy of the vaccination record if obtained from local pharmacy or Health Dept. Verbalized acceptance and understanding.  Flu Vaccine status: Up to date  Pneumococcal vaccine status: Up to date  Covid-19 vaccine status: Completed vaccines  Qualifies for Shingles Vaccine? Yes   Zostavax completed No   Shingrix Completed?: No.    Education has been provided regarding the importance of this vaccine. Patient has been advised to call insurance company to determine out of pocket expense if they have not yet received this vaccine. Advised may also receive vaccine at local pharmacy or Health Dept. Verbalized acceptance and understanding.  Screening Tests Health Maintenance  Topic Date Due   Hepatitis C Screening  Never done   TETANUS/TDAP  Never done   Zoster Vaccines- Shingrix (1 of 2) Never done   DEXA SCAN  Never done   PNA vac Low Risk Adult (1 of 2 - PCV13) Never done   COVID-19 Vaccine (4 - Booster for Pfizer series) 02/25/2021   INFLUENZA VACCINE  05/15/2021   HPV VACCINES  Aged Out    Health Maintenance  Health Maintenance Due  Topic Date Due   Hepatitis C Screening  Never done   TETANUS/TDAP  Never done   Zoster Vaccines- Shingrix (1 of 2) Never done   DEXA SCAN  Never done   PNA vac Low  Risk Adult (1 of 2 - PCV13) Never done   COVID-19 Vaccine (4 - Booster for Pfizer series) 02/25/2021     Colorectal cancer screening: No longer required.   Mammogram status: No longer required due to age.  Bone density status: never done  Lung Cancer Screening: (Low Dose CT Chest recommended if Age 33-80 years, 30 pack-year currently smoking OR have quit w/in 15years.) does qualify.   Lung Cancer Screening Referral: no  Additional Screening:  Hepatitis C Screening: does qualify; Completed no  Vision Screening: Recommended annual ophthalmology exams for early detection of glaucoma and other disorders of the eye. Is the patient up to date with their annual eye exam?  Yes  Who is the provider or what is the name of the office in which the patient attends annual eye exams? Dr. Marica Otter & Dr. Soyla Dryer If pt is not established with a provider, would they like to be referred to a provider to establish care? No .   Dental Screening: Recommended annual dental exams for proper oral hygiene  Community Resource Referral / Chronic Care Management: CRR required this visit?  No   CCM required this visit?  No      Plan:     I have personally reviewed and noted the following in the patient's chart:   Medical and social history Use of alcohol, tobacco or illicit drugs  Current medications and supplements including opioid prescriptions.  Functional ability and status Nutritional status Physical activity Advanced directives List of other physicians Hospitalizations, surgeries, and ER visits in previous 12 months Vitals Screenings to include cognitive, depression, and falls Referrals and appointments  In addition, I have reviewed and discussed with patient certain preventive protocols, quality metrics, and best practice recommendations. A written personalized care plan for preventive services as well as general preventive health recommendations were provided to patient.     Sheral Flow, LPN   6/96/2952   Nurse Notes: n/a

## 2021-04-27 NOTE — Patient Instructions (Addendum)
Alexandra House , Thank you for taking time to come for your Medicare Wellness Visit. I appreciate your ongoing commitment to your health goals. Please review the following plan we discussed and let me know if I can assist you in the future.   Screening recommendations/referrals: Colonoscopy: last done 04/23/2013; not a candidate due to age. Mammogram: last done 07/10/2017 Bone Density: never done Recommended yearly ophthalmology/optometry visit for glaucoma screening and checkup Recommended yearly dental visit for hygiene and checkup  Vaccinations: Influenza vaccine: 10/27/2020 Pneumococcal vaccine: 05/14/2013, 06/28/2016 Tdap vaccine: 03/15/2011; due every 10 years; not covered by Medicare as preventative but will cover as treatment for an injury. Shingles vaccine: Please call your insurance company to determine your out of pocket expense for the Shingrix vaccine. You may receive this vaccine at your local pharmacy.   Covid-19: 12/20/2019, 01/19/2020, 10/28/2020  Advanced directives: Please bring a copy of your health care power of attorney and living will to the office at your convenience.  Conditions/risks identified: Yes. Client understands the importance of follow-up with providers by attending scheduled visits and discussed goals to eat healthier, increase physical activity, exercise the brain, socialize more, get enough rest and make time for laughter.  Next appointment: Please schedule your next Medicare Wellness Visit with your Nurse Health Advisor in 1 year by calling [phone].   Preventive Care 77 Years and Older, Female Preventive care refers to lifestyle choices and visits with your health care provider that can promote health and wellness. What does preventive care include? A yearly physical exam. This is also called an annual well check. Dental exams once or twice a year. Routine eye exams. Ask your health care provider how often you should have your eyes checked. Personal lifestyle  choices, including: Daily care of your teeth and gums. Regular physical activity. Eating a healthy diet. Avoiding tobacco and drug use. Limiting alcohol use. Practicing safe sex. Taking low-dose aspirin every day. Taking vitamin and mineral supplements as recommended by your health care provider. What happens during an annual well check? The services and screenings done by your health care provider during your annual well check will depend on your age, overall health, lifestyle risk factors, and family history of disease. Counseling  Your health care provider may ask you questions about your: Alcohol use. Tobacco use. Drug use. Emotional well-being. Home and relationship well-being. Sexual activity. Eating habits. History of falls. Memory and ability to understand (cognition). Work and work Statistician. Reproductive health. Screening  You may have the following tests or measurements: Height, weight, and BMI. Blood pressure. Lipid and cholesterol levels. These may be checked every 5 years, or more frequently if you are over 69 years old. Skin check. Lung cancer screening. You may have this screening every year starting at age 21 if you have a 30-pack-year history of smoking and currently smoke or have quit within the past 15 years. Fecal occult blood test (FOBT) of the stool. You may have this test every year starting at age 27. Flexible sigmoidoscopy or colonoscopy. You may have a sigmoidoscopy every 5 years or a colonoscopy every 10 years starting at age 46. Hepatitis C blood test. Hepatitis B blood test. Sexually transmitted disease (STD) testing. Diabetes screening. This is done by checking your blood sugar (glucose) after you have not eaten for a while (fasting). You may have this done every 1-3 years. Bone density scan. This is done to screen for osteoporosis. You may have this done starting at age 65. Mammogram. This may be done  every 1-2 years. Talk to your health care  provider about how often you should have regular mammograms. Talk with your health care provider about your test results, treatment options, and if necessary, the need for more tests. Vaccines  Your health care provider may recommend certain vaccines, such as: Influenza vaccine. This is recommended every year. Tetanus, diphtheria, and acellular pertussis (Tdap, Td) vaccine. You may need a Td booster every 10 years. Zoster vaccine. You may need this after age 84. Pneumococcal 13-valent conjugate (PCV13) vaccine. One dose is recommended after age 41. Pneumococcal polysaccharide (PPSV23) vaccine. One dose is recommended after age 65. Talk to your health care provider about which screenings and vaccines you need and how often you need them. This information is not intended to replace advice given to you by your health care provider. Make sure you discuss any questions you have with your health care provider. Document Released: 10/28/2015 Document Revised: 06/20/2016 Document Reviewed: 08/02/2015 Elsevier Interactive Patient Education  2017 [employer] Prevention in the Home Falls can cause injuries. They can happen to people of all ages. There are many things you can do to make your home safe and to help prevent falls. What can I do on the outside of my home? Regularly fix the edges of walkways and driveways and fix any cracks. Remove anything that might make you trip as you walk through a door, such as a raised step or threshold. Trim any bushes or trees on the path to your home. Use bright outdoor lighting. Clear any walking paths of anything that might make someone trip, such as rocks or tools. Regularly check to see if handrails are loose or broken. Make sure that both sides of any steps have handrails. Any raised decks and porches should have guardrails on the edges. Have any leaves, snow, or ice cleared regularly. Use sand or salt on walking paths during winter. Clean up any  spills in your garage right away. This includes oil or grease spills. What can I do in the bathroom? Use night lights. Install grab bars by the toilet and in the tub and shower. Do not use towel bars as grab bars. Use non-skid mats or decals in the tub or shower. If you need to sit down in the shower, use a plastic, non-slip stool. Keep the floor dry. Clean up any water that spills on the floor as soon as it happens. Remove soap buildup in the tub or shower regularly. Attach bath mats securely with double-sided non-slip rug tape. Do not have throw rugs and other things on the floor that can make you trip. What can I do in the bedroom? Use night lights. Make sure that you have a light by your bed that is easy to reach. Do not use any sheets or blankets that are too big for your bed. They should not hang down onto the floor. Have a firm chair that has side arms. You can use this for support while you get dressed. Do not have throw rugs and other things on the floor that can make you trip. What can I do in the kitchen? Clean up any spills right away. Avoid walking on wet floors. Keep items that you use a lot in easy-to-reach places. If you need to reach something above you, use a strong step stool that has a grab bar. Keep electrical cords out of the way. Do not use floor polish or wax that makes floors slippery. If you must use wax, use  non-skid floor wax. Do not have throw rugs and other things on the floor that can make you trip. What can I do with my stairs? Do not leave any items on the stairs. Make sure that there are handrails on both sides of the stairs and use them. Fix handrails that are broken or loose. Make sure that handrails are as long as the stairways. Check any carpeting to make sure that it is firmly attached to the stairs. Fix any carpet that is loose or worn. Avoid having throw rugs at the top or bottom of the stairs. If you do have throw rugs, attach them to the floor  with carpet tape. Make sure that you have a light switch at the top of the stairs and the bottom of the stairs. If you do not have them, ask someone to add them for you. What else can I do to help prevent falls? Wear shoes that: Do not have high heels. Have rubber bottoms. Are comfortable and fit you well. Are closed at the toe. Do not wear sandals. If you use a stepladder: Make sure that it is fully opened. Do not climb a closed stepladder. Make sure that both sides of the stepladder are locked into place. Ask someone to hold it for you, if possible. Clearly mark and make sure that you can see: Any grab bars or handrails. First and last steps. Where the edge of each step is. Use tools that help you move around (mobility aids) if they are needed. These include: Canes. Walkers. Scooters. Crutches. Turn on the lights when you go into a dark area. Replace any light bulbs as soon as they burn out. Set up your furniture so you have a clear path. Avoid moving your furniture around. If any of your floors are uneven, fix them. If there are any pets around you, be aware of where they are. Review your medicines with your doctor. Some medicines can make you feel dizzy. This can increase your chance of falling. Ask your doctor what other things that you can do to help prevent falls. This information is not intended to replace advice given to you by your health care provider. Make sure you discuss any questions you have with your health care provider. Document Released: 07/28/2009 Document Revised: 03/08/2016 Document Reviewed: 11/05/2014 Elsevier Interactive Patient Education  2017 [employer].

## 2021-06-08 ENCOUNTER — Telehealth: Payer: Self-pay | Admitting: Emergency Medicine

## 2021-06-08 ENCOUNTER — Other Ambulatory Visit: Payer: Self-pay | Admitting: Internal Medicine

## 2021-06-08 DIAGNOSIS — J411 Mucopurulent chronic bronchitis: Secondary | ICD-10-CM

## 2021-06-08 MED ORDER — BENZONATATE 100 MG PO CAPS: 100.0000 mg | ORAL_CAPSULE | Freq: Two times a day (BID) | ORAL | 0 refills | Status: DC | PRN

## 2021-06-08 NOTE — Telephone Encounter (Signed)
Pt is requesting a refill of her benzonatate (TESSALON) 100 MG capsule. Pharmacy is [facility] on [address]. Please advise. Thanks.

## 2021-07-15 DIAGNOSIS — G459 Transient cerebral ischemic attack, unspecified: Secondary | ICD-10-CM

## 2021-07-15 HISTORY — DX: Transient cerebral ischemic attack, unspecified: G45.9

## 2021-07-21 ENCOUNTER — Other Ambulatory Visit: Payer: Self-pay | Admitting: Internal Medicine

## 2021-07-21 DIAGNOSIS — F411 Generalized anxiety disorder: Secondary | ICD-10-CM

## 2021-08-02 ENCOUNTER — Observation Stay (HOSPITAL_BASED_OUTPATIENT_CLINIC_OR_DEPARTMENT_OTHER)
Admission: EM | Admit: 2021-08-02 | Discharge: 2021-08-03 | Disposition: A | Discharge status: Home / Self Care | Payer: Medicare HMO | Attending: Emergency Medicine | Admitting: Emergency Medicine

## 2021-08-02 ENCOUNTER — Encounter (HOSPITAL_BASED_OUTPATIENT_CLINIC_OR_DEPARTMENT_OTHER): Payer: Self-pay

## 2021-08-02 ENCOUNTER — Other Ambulatory Visit: Payer: Self-pay

## 2021-08-02 ENCOUNTER — Emergency Department (HOSPITAL_BASED_OUTPATIENT_CLINIC_OR_DEPARTMENT_OTHER): Payer: Medicare HMO

## 2021-08-02 ENCOUNTER — Observation Stay (HOSPITAL_COMMUNITY): Payer: Medicare HMO

## 2021-08-02 ENCOUNTER — Emergency Department (HOSPITAL_COMMUNITY): Payer: Medicare HMO

## 2021-08-02 DIAGNOSIS — G459 Transient cerebral ischemic attack, unspecified: Secondary | ICD-10-CM

## 2021-08-02 DIAGNOSIS — Z79899 Other long term (current) drug therapy: Secondary | ICD-10-CM | POA: Insufficient documentation

## 2021-08-02 DIAGNOSIS — I110 Hypertensive heart disease with heart failure: Secondary | ICD-10-CM | POA: Diagnosis not present

## 2021-08-02 DIAGNOSIS — Z20822 Contact with and (suspected) exposure to covid-19: Secondary | ICD-10-CM | POA: Insufficient documentation

## 2021-08-02 DIAGNOSIS — R4781 Slurred speech: Secondary | ICD-10-CM | POA: Diagnosis not present

## 2021-08-02 DIAGNOSIS — R4701 Aphasia: Secondary | ICD-10-CM | POA: Diagnosis present

## 2021-08-02 DIAGNOSIS — E871 Hypo-osmolality and hyponatremia: Secondary | ICD-10-CM

## 2021-08-02 DIAGNOSIS — R030 Elevated blood-pressure reading, without diagnosis of hypertension: Secondary | ICD-10-CM | POA: Diagnosis not present

## 2021-08-02 DIAGNOSIS — I63232 Cerebral infarction due to unspecified occlusion or stenosis of left carotid arteries: Secondary | ICD-10-CM | POA: Diagnosis not present

## 2021-08-02 DIAGNOSIS — I2510 Atherosclerotic heart disease of native coronary artery without angina pectoris: Secondary | ICD-10-CM | POA: Insufficient documentation

## 2021-08-02 DIAGNOSIS — I639 Cerebral infarction, unspecified: Secondary | ICD-10-CM | POA: Diagnosis not present

## 2021-08-02 DIAGNOSIS — E785 Hyperlipidemia, unspecified: Secondary | ICD-10-CM | POA: Diagnosis not present

## 2021-08-02 DIAGNOSIS — I771 Stricture of artery: Secondary | ICD-10-CM

## 2021-08-02 DIAGNOSIS — F17210 Nicotine dependence, cigarettes, uncomplicated: Secondary | ICD-10-CM | POA: Diagnosis not present

## 2021-08-02 DIAGNOSIS — I5042 Chronic combined systolic (congestive) and diastolic (congestive) heart failure: Secondary | ICD-10-CM | POA: Diagnosis not present

## 2021-08-02 DIAGNOSIS — K5730 Diverticulosis of large intestine without perforation or abscess without bleeding: Secondary | ICD-10-CM | POA: Diagnosis not present

## 2021-08-02 DIAGNOSIS — N179 Acute kidney failure, unspecified: Secondary | ICD-10-CM | POA: Diagnosis not present

## 2021-08-02 DIAGNOSIS — R42 Dizziness and giddiness: Secondary | ICD-10-CM | POA: Diagnosis not present

## 2021-08-02 DIAGNOSIS — M6281 Muscle weakness (generalized): Secondary | ICD-10-CM | POA: Diagnosis not present

## 2021-08-02 DIAGNOSIS — K449 Diaphragmatic hernia without obstruction or gangrene: Secondary | ICD-10-CM | POA: Diagnosis not present

## 2021-08-02 DIAGNOSIS — J432 Centrilobular emphysema: Secondary | ICD-10-CM | POA: Diagnosis not present

## 2021-08-02 DIAGNOSIS — G319 Degenerative disease of nervous system, unspecified: Secondary | ICD-10-CM | POA: Diagnosis not present

## 2021-08-02 DIAGNOSIS — I63212 Cerebral infarction due to unspecified occlusion or stenosis of left vertebral arteries: Secondary | ICD-10-CM | POA: Diagnosis not present

## 2021-08-02 DIAGNOSIS — I700 Atherosclerosis of aorta: Secondary | ICD-10-CM | POA: Diagnosis not present

## 2021-08-02 DIAGNOSIS — R001 Bradycardia, unspecified: Secondary | ICD-10-CM | POA: Diagnosis not present

## 2021-08-02 LAB — URINALYSIS, ROUTINE W REFLEX MICROSCOPIC
Bilirubin Urine: NEGATIVE
Glucose, UA: NEGATIVE mg/dL
Hgb urine dipstick: NEGATIVE
Ketones, ur: NEGATIVE mg/dL
Leukocytes,Ua: NEGATIVE
Nitrite: NEGATIVE
Protein, ur: NEGATIVE mg/dL
Specific Gravity, Urine: 1.013 (ref 1.005–1.030)
pH: 6.5 (ref 5.0–8.0)

## 2021-08-02 LAB — DIFFERENTIAL
Abs Immature Granulocytes: 0.02 10*3/uL (ref 0.00–0.07)
Basophils Absolute: 0.1 10*3/uL (ref 0.0–0.1)
Basophils Relative: 1 %
Eosinophils Absolute: 0.5 10*3/uL (ref 0.0–0.5)
Eosinophils Relative: 5 %
Immature Granulocytes: 0 %
Lymphocytes Relative: 31 %
Lymphs Abs: 2.9 10*3/uL (ref 0.7–4.0)
Monocytes Absolute: 0.8 10*3/uL (ref 0.1–1.0)
Monocytes Relative: 8 %
Neutro Abs: 5.1 10*3/uL (ref 1.7–7.7)
Neutrophils Relative %: 55 %

## 2021-08-02 LAB — CBC
HCT: 35.3 % — ABNORMAL LOW (ref 36.0–46.0)
Hemoglobin: 11.7 g/dL — ABNORMAL LOW (ref 12.0–15.0)
MCH: 29 pg (ref 26.0–34.0)
MCHC: 33.1 g/dL (ref 30.0–36.0)
MCV: 87.6 fL (ref 80.0–100.0)
Platelets: 315 10*3/uL (ref 150–400)
RBC: 4.03 MIL/uL (ref 3.87–5.11)
RDW: 13.3 % (ref 11.5–15.5)
WBC: 9.4 10*3/uL (ref 4.0–10.5)
nRBC: 0 % (ref 0.0–0.2)

## 2021-08-02 LAB — COMPREHENSIVE METABOLIC PANEL
ALT: 8 U/L (ref 0–44)
AST: 19 U/L (ref 15–41)
Albumin: 3.9 g/dL (ref 3.5–5.0)
Alkaline Phosphatase: 67 U/L (ref 38–126)
Anion gap: 9 (ref 5–15)
BUN: 18 mg/dL (ref 8–23)
CO2: 24 mmol/L (ref 22–32)
Calcium: 9.3 mg/dL (ref 8.9–10.3)
Chloride: 96 mmol/L — ABNORMAL LOW (ref 98–111)
Creatinine, Ser: 1.11 mg/dL — ABNORMAL HIGH (ref 0.44–1.00)
GFR, Estimated: 51 mL/min — ABNORMAL LOW (ref >60)
Glucose, Bld: 87 mg/dL (ref 70–99)
Potassium: 4.2 mmol/L (ref 3.5–5.1)
Sodium: 129 mmol/L — ABNORMAL LOW (ref 135–145)
Total Bilirubin: 0.4 mg/dL (ref 0.3–1.2)
Total Protein: 7.1 g/dL (ref 6.5–8.1)

## 2021-08-02 LAB — PROTIME-INR
INR: 0.9 (ref 0.8–1.2)
Prothrombin Time: 12.4 seconds (ref 11.4–15.2)

## 2021-08-02 LAB — RESP PANEL BY RT-PCR (FLU A&B, COVID) ARPGX2
Influenza A by PCR: NEGATIVE
Influenza B by PCR: NEGATIVE
SARS Coronavirus 2 by RT PCR: NEGATIVE

## 2021-08-02 LAB — APTT: aPTT: 30 seconds (ref 24–36)

## 2021-08-02 LAB — CBG MONITORING, ED: Glucose-Capillary: 98 mg/dL (ref 70–99)

## 2021-08-02 MED ORDER — LACTATED RINGERS IV BOLUS
500.0000 mL | Freq: Once | INTRAVENOUS | Status: AC
  Administered 2021-08-02: 500 mL via INTRAVENOUS

## 2021-08-02 MED ORDER — STROKE: EARLY STAGES OF RECOVERY BOOK: Freq: Once | Status: AC

## 2021-08-02 MED ORDER — LACTATED RINGERS IV SOLN: INTRAVENOUS | Status: AC

## 2021-08-02 MED ORDER — ASPIRIN 325 MG PO TABS
325.0000 mg | ORAL_TABLET | Freq: Once | ORAL | Status: AC
  Administered 2021-08-03: 325 mg via ORAL
  Filled 2021-08-02: qty 1

## 2021-08-02 MED ORDER — IOHEXOL 350 MG/ML SOLN
75.0000 mL | Freq: Once | INTRAVENOUS | Status: AC | PRN
  Administered 2021-08-02: 75 mL via INTRAVENOUS

## 2021-08-02 MED ORDER — SODIUM CHLORIDE 0.9% FLUSH
3.0000 mL | Freq: Once | INTRAVENOUS | Status: AC
  Administered 2021-08-02: 3 mL via INTRAVENOUS
  Filled 2021-08-02: qty 3

## 2021-08-02 NOTE — ED Notes (Signed)
Slurred speech per pt and granddaughter at bedside onset last night at 2200 with dizziness; relieved at present.  Pt is a/o x 4, clear speech noted in triage. Decreased appetite in last week.

## 2021-08-02 NOTE — ED Provider Notes (Signed)
[facility] EMERGENCY DEPARTMENT Provider Note   CSN: [id number] Arrival date & time: 08/02/21  1131     History Chief Complaint  Patient presents with   Aphasia    Alexandra House is a 75 y.o. female.  HPI  75 year old female presenting to the emergency department in transfer from an outside hospital ED for admission for TIA work-up.  The history supplied by the patient, family and chart.  Patient reportedly endorsed slurred speech last night onset around 2200 with associated dizziness.  Symptoms have since resolved.  Neurology was consulted at the outside ED and recommended transfer for TIA work-up.  On arrival, the patient was at her baseline with no complaints.  Past Medical History:  Diagnosis Date   Acute on chronic respiratory failure (HCC)    Anxiety    Caloric malnutrition (HCC)    Carotid stenosis    Coronary artery disease    Fibromyalgia    HLD (hyperlipidemia)    Hx of cardiovascular stress test    a. Lex MV 11/13: + significant ECG changes and CHB with infusion; EF 81%, no ischemia,    Hx of echocardiogram    a. Echo 12/12: EF 40-98%, grade 2 diastolic dysfunction, mild MR.; b. Echo 11/13:  mild LVH, EF 60-65%   Hypertension    Neuropathy    Osteoporosis    Syncope 08/2009   a. ETT-Echo 1/11:  poor ex tol, submax exercise, no WMA or ECG changes c/w ischemia;      Patient Active Problem List   Diagnosis Date Noted   Aphasia 08/02/2021   Mucopurulent chronic bronchitis ([place]) 12/06/2020   Hyperlipidemia with target LDL less than 130 12/06/2020   Vitamin D deficiency disease 12/06/2020   Cough with sputum 12/06/2020   Alopecia 11/18/2017   Malnutrition ([place]) 11/91/4782   Systolic and diastolic CHF, chronic (HCC)    Sinus bradycardia 04/20/2017   Tobacco abuse 04/20/2017   Carotid stenosis, bilateral 04/20/2017   Cerebrovascular disease 04/06/2013    Past Surgical History:  Procedure Laterality Date   None       OB History   No  obstetric history on file.     Family History  Problem Relation Age of Onset   CAD Mother 33       Status post PCI   Breast cancer Mother    Lung cancer Father    Colon cancer Neg Hx     Social History   Tobacco Use   Smoking status: Every Day    Packs/day: 0.50    Types: Cigarettes   Smokeless tobacco: Current  Vaping Use   Vaping Use: Never used  Substance Use Topics   Alcohol use: Yes    Comment: glass wine once every 6 mos. per patient   Drug use: No    Home Medications Prior to Admission medications   Medication Sig Start Date End Date Taking? Authorizing Provider  alendronate (FOSAMAX) 70 MG tablet  02/27/18   [provider]  aspirin EC 81 MG tablet Take 81 mg by mouth daily.    [provider]  benzonatate (TESSALON) 100 MG capsule Take 1 capsule (100 mg total) by mouth 2 (two) times daily as needed for cough. 06/08/21   Janith Lima, MD  BREO ELLIPTA 100-25 MCG/INH AEPB Inhale 1 puff into the lungs daily. 12/06/20   Janith Lima, MD  Cholecalciferol 125 MCG (5000 UT) capsule Take 1 capsule (5,000 Units total) by mouth daily. 12/06/20  Janith Lima, MD  EPINEPHrine (EPIPEN JR) 0.15 MG/0.3ML injection Inject 15 mg into the muscle as needed. Patient not taking: Reported on 04/27/2021 05/28/17   [provider]  ergocalciferol (VITAMIN D2) 50000 units capsule Take 50,000 Units by mouth once a week.    [provider]  guaiFENesin (MUCINEX) 600 MG 12 hr tablet Take by mouth as needed.    [provider]  LORazepam (ATIVAN) 1 MG tablet TAKE 1 TABLET(1 MG) BY MOUTH EVERY 8 HOURS AS NEEDED FOR ANXIETY 07/21/21   Janith Lima, MD  Multiple Vitamins-Minerals (MULTIVITAMIN GUMMIES ADULT PO) Take 2 tablets by mouth daily.    [provider]  rosuvastatin (CRESTOR) 10 MG tablet Take 1 tablet (10 mg total) by mouth at bedtime. 12/06/20   Janith Lima, MD    Allergies    Bee venom and Lipitor  [atorvastatin]  Review of Systems   Review of Systems  Constitutional:  Negative for chills and fever.  HENT:  Negative for ear pain and sore throat.   Eyes:  Negative for pain and visual disturbance.  Respiratory:  Negative for cough and shortness of breath.   Cardiovascular:  Negative for chest pain and palpitations.  Gastrointestinal:  Negative for abdominal pain and vomiting.  Genitourinary:  Negative for dysuria and hematuria.  Musculoskeletal:  Negative for arthralgias and back pain.  Skin:  Negative for color change and rash.  Neurological:  Negative for seizures and syncope.  All other systems reviewed and are negative.  Physical Exam Updated Vital Signs BP 98/67   Pulse 68   Temp 98.5 F (36.9 C) (Oral)   Resp 14   Ht 4\' 11"  (1.499 m)   Wt 38.1 kg   LMP  (LMP Unknown)   SpO2 100%   BMI 16.97 kg/m   Physical Exam Vitals and nursing note reviewed.  Constitutional:      General: She is not in acute distress. HENT:     Head: Normocephalic and atraumatic.  Eyes:     Conjunctiva/sclera: Conjunctivae normal.     Pupils: Pupils are equal, round, and reactive to light.  Cardiovascular:     Rate and Rhythm: Normal rate and regular rhythm.  Pulmonary:     Effort: Pulmonary effort is normal. No respiratory distress.  Abdominal:     General: There is no distension.     Tenderness: There is no guarding.  Musculoskeletal:        General: No deformity or signs of injury.     Cervical back: Normal range of motion and neck supple.  Skin:    Findings: No lesion or rash.  Neurological:     General: No focal deficit present.     Mental Status: She is alert and oriented to person, place, and time. Mental status is at baseline.     Comments: MENTAL STATUS EXAM:    Orientation: Alert and oriented to person, place and time.  Memory: Cooperative, follows commands well.  Language: Speech is clear and language is normal.   CRANIAL NERVES:    CN 2 (Optic): Visual fields intact  to confrontation.  CN 3,4,6 (EOM): Pupils equal and reactive to light. Full extraocular eye movement without nystagmus.  CN 5 (Trigeminal): Facial sensation is normal, no weakness of masticatory muscles.  CN 7 (Facial): No facial weakness or asymmetry.  CN 8 (Auditory): Auditory acuity grossly normal.  CN 9,10 (Glossophar): The uvula is midline, the palate elevates symmetrically.  CN 11 (spinal access): Normal sternocleidomastoid  and trapezius strength.  CN 12 (Hypoglossal): The tongue is midline. No atrophy or fasciculations.Marland Kitchen   MOTOR:  Muscle Strength: 5/5RUE, 5/5LUE, 5/5RLE, 5/5 LLE.   COORDINATION:   Intact finger-to-nose, no tremor, no pronator drift.   SENSATION:   Intact to light touch all four extremities.       ED Results / Procedures / Treatments   Labs (all labs ordered are listed, but only abnormal results are displayed) Labs Reviewed  CBC - Abnormal; Notable for the following components:      Result Value   Hemoglobin 11.7 (*)    HCT 35.3 (*)    All other components within normal limits  COMPREHENSIVE METABOLIC PANEL - Abnormal; Notable for the following components:   Sodium 129 (*)    Chloride 96 (*)    Creatinine, Ser 1.11 (*)    GFR, Estimated 51 (*)    All other components within normal limits  RESP PANEL BY RT-PCR (FLU A&B, COVID) ARPGX2  PROTIME-INR  APTT  DIFFERENTIAL  URINALYSIS, ROUTINE W REFLEX MICROSCOPIC  HEMOGLOBIN A1C  LIPID PANEL  CBG MONITORING, ED    EKG EKG Interpretation  Date/Time:  Wednesday August 02 2021 11:49:13 EDT Ventricular Rate:  69 PR Interval:  156 QRS Duration: 76 QT Interval:  412 QTC Calculation: 441 R Axis:   89 Text Interpretation: Normal sinus rhythm ST & T wave abnormality, consider inferior ischemia Abnormal ECG Confirmed by Godfrey Pick [phone]) on 08/02/2021 12:03:02 PM  Radiology CT HEAD WO CONTRAST  Result Date: 08/02/2021 CLINICAL DATA:  Dizziness and slurred speech since last evening. EXAM: CT HEAD  WITHOUT CONTRAST TECHNIQUE: Contiguous axial images were obtained from the base of the skull through the vertex without intravenous contrast. COMPARISON:  04/30/2017 FINDINGS: Brain: Stable age related cerebral atrophy, ventriculomegaly and periventricular white matter disease. No extra-axial fluid collections are identified. No CT findings for acute hemispheric infarction or intracranial hemorrhage. No mass lesions. The brainstem and cerebellum are normal. Vascular: Stable vascular calcifications. No aneurysm or hyperdense vessels. Skull: No skull fracture or bone lesions. Sinuses/Orbits: The paranasal sinuses and mastoid air cells are clear. The globes are intact. Other: No scalp lesions or scalp hematoma. IMPRESSION: 1. Stable age related cerebral atrophy, ventriculomegaly and periventricular white matter disease. 2. No acute intracranial findings or mass lesions. Electronically Signed   By: Marijo Sanes M.D.   On: 08/02/2021 13:12   CT Angio Neck W and/or Wo Contrast  Result Date: 08/02/2021 CLINICAL DATA:  Stroke/TIA, assess extracranial arteries EXAM: CT ANGIOGRAPHY NECK TECHNIQUE: Multidetector CT imaging of the neck was performed using the standard protocol during bolus administration of intravenous contrast. Multiplanar CT image reconstructions and MIPs were obtained to evaluate the vascular anatomy. Carotid stenosis measurements (when applicable) are obtained utilizing NASCET criteria, using the distal internal carotid diameter as the denominator. CONTRAST:  79mL OMNIPAQUE IOHEXOL 350 MG/ML SOLN COMPARISON:  None. FINDINGS: Aortic arch: Standard branching. Mild multifocal irregularity of the proximal left subclavian artery, which remains patent. Please see same-day CTA chest for additional thoracic vasculature findings. Right carotid system: No evidence of dissection, stenosis (50% or greater) or occlusion. Left carotid system: No evidence of dissection, stenosis (50% or greater) or occlusion.  Vertebral arteries: No evidence of dissection, stenosis (50% or greater) or occlusion. Skeleton: No acute osseous abnormality. Other neck: Negative. Upper chest: For findings in the thorax, please see same day CTA chest. IMPRESSION: 1. No hemodynamically significant stenosis in the arteries of the neck. 2. For findings in the thorax,  please see same-day CTA chest. Electronically Signed   By: Merilyn Baba M.D.   On: 08/02/2021 21:39   MR BRAIN WO CONTRAST  Result Date: 08/02/2021 CLINICAL DATA:  TIA.  Slurred speech. EXAM: MRI HEAD WITHOUT CONTRAST TECHNIQUE: Multiplanar, multiecho pulse sequences of the brain and surrounding structures were obtained without intravenous contrast. COMPARISON:  CT head 08/02/2021 FINDINGS: Brain: Negative for acute infarct. Mild to moderate atrophy. Mild white matter changes with scattered small deep white matter hyperintensities bilaterally. Negative for hemorrhage or mass. Vascular: Normal arterial flow voids. Skull and upper cervical spine: Negative Sinuses/Orbits: Negative Other: None IMPRESSION: No acute infarct Atrophy and mild chronic microvascular ischemia. Electronically Signed   By: Franchot Gallo M.D.   On: 08/02/2021 19:27   CT Angio Chest/Abd/Pel for Dissection W and/or Wo Contrast  Result Date: 08/02/2021 CLINICAL DATA:  BP difference between both arms, evaluate for aortic dissection EXAM: CT ANGIOGRAPHY CHEST, ABDOMEN AND PELVIS TECHNIQUE: Non-contrast CT of the chest was initially obtained. Multidetector CT imaging through the chest, abdomen and pelvis was performed using the standard protocol during bolus administration of intravenous contrast. Multiplanar reconstructed images and MIPs were obtained and reviewed to evaluate the vascular anatomy. CONTRAST:  26mL OMNIPAQUE IOHEXOL 350 MG/ML SOLN COMPARISON:  Chest radiographs dated 12/06/2020. Low-dose lung cancer screening CT chest dated 07/06/2019. FINDINGS: CTA CHEST FINDINGS Cardiovascular: On unenhanced  CT, there is no evidence of intramural hematoma. Following contrast administration, there is preferential opacification of the thoracic aorta. No evidence of thoracic aortic aneurysm or dissection. Although not tailored for evaluation of the pulmonary arteries, there is no evidence of pulmonary embolism to the segmental level. The heart is normal in size.  No pericardial effusion. Mild coronary atherosclerosis of the LAD. Mediastinum/Nodes: No suspicious mediastinal lymphadenopathy. Visualized thyroid is unremarkable. Lungs/Pleura: Moderate centrilobular and paraseptal emphysematous changes, upper lung predominant. Biapical pleural-parenchymal scarring. No suspicious pulmonary nodules. No focal consolidation. No pleural effusion or pneumothorax. Musculoskeletal: No focal osseous lesions. Review of the MIP images confirms the above findings. CTA ABDOMEN AND PELVIS FINDINGS VASCULAR Aorta: No evidence abdominal aortic aneurysm or dissection. Patent. Atherosclerotic calcifications. Celiac: Patent. SMA: Patent. Renals: Patent bilaterally IMA: Patent. Inflow: Patent.  Atherosclerotic calcifications bilaterally. Veins: Grossly unremarkable. Review of the MIP images confirms the above findings. NON-VASCULAR Hepatobiliary: Liver is within normal limits. Gallbladder is unremarkable. No intrahepatic or extrahepatic ductal dilatation. Pancreas: Within normal limits. Spleen: Within normal limits. Adrenals/Urinary Tract: Adrenal glands are within normal limits. Kidneys are within normal limits.  No hydronephrosis. Bladder is within normal limits. Stomach/Bowel: Stomach is notable for a small hiatal hernia. No evidence of bowel obstruction. Normal appendix (series 8/image 217). Sigmoid diverticulosis, without evidence of diverticulitis. Lymphatic: No suspicious abdominopelvic lymphadenopathy. Reproductive: Uterus is within normal limits. Bilateral ovaries are within normal limits. Other: No abdominopelvic ascites.  Musculoskeletal: No focal osseous lesions. Review of the MIP images confirms the above findings. IMPRESSION: No evidence of thoracoabdominal aortic aneurysm or dissection. No acute cardiopulmonary abnormality. Sigmoid diverticulosis, without evidence of diverticulitis. Aortic Atherosclerosis (ICD10-I70.0) and Emphysema (ICD10-J43.9). Electronically Signed   By: Julian Hy M.D.   On: 08/02/2021 21:41    Procedures Procedures   Medications Ordered in ED Medications  lactated ringers infusion (has no administration in time range)  aspirin tablet 325 mg (has no administration in time range)  sodium chloride flush (NS) 0.9 % injection 3 mL (3 mLs Intravenous Given 08/02/21 1205)  lactated ringers bolus 500 mL (0 mLs Intravenous Stopped 08/02/21 1838)  iohexol (OMNIPAQUE)  350 MG/ML injection 75 mL (75 mLs Intravenous Contrast Given 08/02/21 2119)   stroke: mapping our early stages of recovery book ( Does not apply Given 08/02/21 2249)    ED Course  I have reviewed the triage vital signs and the nursing notes.  Pertinent labs & imaging results that were available during my care of the patient were reviewed by me and considered in my medical decision making (see chart for details).    MDM Rules/Calculators/A&P                           75 year old female presenting to the emergency department in transfer from an outside hospital ED for admission for TIA work-up.  The history supplied by the patient, family and chart.  Patient reportedly endorsed slurred speech last night onset around 2200 with associated dizziness.  Symptoms have since resolved.  Neurology was consulted at the outside ED and recommended transfer for TIA work-up.  On arrival, the patient was at her baseline with no complaints.  Neurologic exam negative for acute abnormalities.  Of note, the patient had marked difference in blood pressure measurements between her left arm and her right arm.  She denies any chest pain  radiating to her back or shortness of breath.  She has no significant pulse deficit.  Lower suspicion for acute aortic dissection however significant difference in blood pressure measurements between the 2 arms in addition to a recent transient neurologic deficit raises concern for possible aortic abnormality.  CTA of the chest abdomen pelvis and CTA neck performed with out evidence of acute abnormality noted.  No dissection.  Following imaging, hospitalist medicine consulted for further stroke work-up.  Final Clinical Impression(s) / ED Diagnoses Final diagnoses:  TIA (transient ischemic attack)    Rx / DC Orders ED Discharge Orders     None        Regan Lemming, MD 08/02/21 2258

## 2021-08-02 NOTE — ED Notes (Signed)
Called [facility] and s/w Kim.  Requested transport to [facility] ED @ 16:20

## 2021-08-02 NOTE — ED Notes (Signed)
Pt transported to MRI 

## 2021-08-02 NOTE — ED Notes (Signed)
[facility] Rn notified of transport

## 2021-08-02 NOTE — ED Notes (Signed)
Report called to [facility] 

## 2021-08-02 NOTE — ED Notes (Signed)
Received pt, transfer from [facility] via EMS. AO x 4, NAD, denies any symptoms. BP rechecked both arms, right arm= 139/62, left arm= 99/60

## 2021-08-02 NOTE — ED Notes (Addendum)
Pt Systolic showed to be low. BP taken x2 on left and cuff repositioned. Pt 69'A and 37'C systolic. Changed to rightarm and obtained 122/58. Manuel on left arm 85/55. Md notified. Dr. Roslynn Amble respond to assess Pt.

## 2021-08-02 NOTE — ED Provider Notes (Addendum)
[facility] EMERGENCY DEPT Provider Note   CSN: [id number] Arrival date & time: 08/02/21  1131     History Chief Complaint  Patient presents with   Aphasia    Alexandra House is a 75 y.o. female.  HPI Patient presents for a transient episode of expressive aphasia.  This episode occurred last night.  She describes it as having a difficult time saying words.  She also states that she was unable to write words that she wanted to write.  She estimates that the episode lasted for 30 minutes.  She had some dizziness at that time but denies any other associated symptoms.  Since this episode, she has been asymptomatic, including this morning.  She denies any known history of stroke.  She is currently not taking any anticoagulation or any antiplatelet medications.  She presents to the ED with her granddaughter.  Patient's granddaughter states that she went to the patient's home shortly after this episode, when symptoms were resolving.  Patient's granddaughter has not noticed any changes in the patient today.    Past Medical History:  Diagnosis Date   Acute on chronic respiratory failure (HCC)    Anxiety    Caloric malnutrition (HCC)    Carotid stenosis    Coronary artery disease    Fibromyalgia    HLD (hyperlipidemia)    Hx of cardiovascular stress test    a. Lex MV 11/13: + significant ECG changes and CHB with infusion; EF 81%, no ischemia,    Hx of echocardiogram    a. Echo 12/12: EF 40-98%, grade 2 diastolic dysfunction, mild MR.; b. Echo 11/13:  mild LVH, EF 60-65%   Hypertension    Neuropathy    Osteoporosis    Syncope 08/2009   a. ETT-Echo 1/11:  poor ex tol, submax exercise, no WMA or ECG changes c/w ischemia;      Patient Active Problem List   Diagnosis Date Noted   Hyponatremia 08/03/2021   Hyperlipidemia 08/03/2021   Aphasia 08/02/2021   Mucopurulent chronic bronchitis ([place]) 12/06/2020   Hyperlipidemia with target LDL less than 130 12/06/2020   Vitamin D  deficiency disease 12/06/2020   Cough with sputum 12/06/2020   Alopecia 11/18/2017   Malnutrition ([place]) 11/91/4782   Systolic and diastolic CHF, chronic (HCC)    AKI (acute kidney injury) ([place]) 04/20/2017   Sinus bradycardia 04/20/2017   Tobacco abuse 04/20/2017   Carotid stenosis, bilateral 04/20/2017   Cerebrovascular disease 04/06/2013    Past Surgical History:  Procedure Laterality Date   None       OB History   No obstetric history on file.     Family History  Problem Relation Age of Onset   CAD Mother 17       Status post PCI   Breast cancer Mother    Lung cancer Father    Colon cancer Neg Hx     Social History   Tobacco Use   Smoking status: Every Day    Packs/day: 0.50    Types: Cigarettes   Smokeless tobacco: Current  Vaping Use   Vaping Use: Never used  Substance Use Topics   Alcohol use: Yes    Comment: glass wine once every 6 mos. per patient   Drug use: No    Home Medications Prior to Admission medications   Medication Sig Start Date End Date Taking? Authorizing Provider  alendronate (FOSAMAX) 70 MG tablet  02/27/18   [provider]  aspirin EC 81 MG tablet Take 81  mg by mouth daily.    [provider]  benzonatate (TESSALON) 100 MG capsule Take 1 capsule (100 mg total) by mouth 2 (two) times daily as needed for cough. 06/08/21   Janith Lima, MD  BREO ELLIPTA 100-25 MCG/INH AEPB Inhale 1 puff into the lungs daily. 12/06/20   Janith Lima, MD  Cholecalciferol 125 MCG (5000 UT) capsule Take 1 capsule (5,000 Units total) by mouth daily. 12/06/20   Janith Lima, MD  EPINEPHrine (EPIPEN JR) 0.15 MG/0.3ML injection Inject 15 mg into the muscle as needed. Patient not taking: Reported on 04/27/2021 05/28/17   [provider]  ergocalciferol (VITAMIN D2) 50000 units capsule Take 50,000 Units by mouth once a week.    [provider]  guaiFENesin (MUCINEX) 600 MG 12 hr tablet Take by mouth as needed.    [provider]  LORazepam (ATIVAN) 1 MG tablet TAKE 1 TABLET(1 MG) BY MOUTH EVERY 8 HOURS AS NEEDED FOR ANXIETY 07/21/21   Janith Lima, MD  Multiple Vitamins-Minerals (MULTIVITAMIN GUMMIES ADULT PO) Take 2 tablets by mouth daily.    [provider]  rosuvastatin (CRESTOR) 10 MG tablet Take 1 tablet (10 mg total) by mouth at bedtime. 12/06/20   Janith Lima, MD    Allergies    Bee venom and Lipitor [atorvastatin]  Review of Systems   Review of Systems  Constitutional:  Negative for activity change, chills, diaphoresis, fatigue and fever.  HENT:  Negative for ear pain and sore throat.   Eyes:  Negative for pain and visual disturbance.  Respiratory:  Negative for cough, chest tightness, shortness of breath and wheezing.   Cardiovascular:  Negative for chest pain and palpitations.  Gastrointestinal:  Negative for abdominal pain, diarrhea, nausea and vomiting.  Genitourinary:  Negative for dysuria, flank pain, hematuria and pelvic pain.  Musculoskeletal:  Negative for arthralgias and back pain.  Skin:  Negative for color change and rash.  Neurological:  Positive for dizziness and speech difficulty. Negative for seizures, syncope, facial asymmetry, weakness, light-headedness, numbness and headaches.  Hematological:  Does not bruise/bleed easily.  Psychiatric/Behavioral:  Negative for confusion and decreased concentration.   All other systems reviewed and are negative.  Physical Exam Updated Vital Signs BP (!) 161/78   Pulse 67   Temp 97.7 F (36.5 C)   Resp (!) 24   Ht 4\' 11"  (1.499 m)   Wt 38.1 kg   LMP  (LMP Unknown)   SpO2 96%   BMI 16.97 kg/m   Physical Exam Vitals and nursing note reviewed.  Constitutional:      General: She is not in acute distress.    Appearance: Normal appearance. She is well-developed and normal weight. She is not ill-appearing, toxic-appearing or diaphoretic.  HENT:     Head: Normocephalic and atraumatic.     Left Ear: External ear  normal.     Nose: Nose normal.     Mouth/Throat:     Mouth: Mucous membranes are moist.     Pharynx: Oropharynx is clear.  Eyes:     General: No visual field deficit.    Conjunctiva/sclera: Conjunctivae normal.  Cardiovascular:     Rate and Rhythm: Normal rate and regular rhythm.     Heart sounds: No murmur heard. Pulmonary:     Effort: Pulmonary effort is normal. No respiratory distress.     Breath sounds: Normal breath sounds. No wheezing.  Abdominal:     Palpations: Abdomen is soft.  Tenderness: There is no abdominal tenderness.  Musculoskeletal:        General: Normal range of motion.     Cervical back: Normal range of motion and neck supple.     Right lower leg: No edema.     Left lower leg: No edema.  Skin:    General: Skin is warm and dry.     Coloration: Skin is not jaundiced or pale.  Neurological:     General: No focal deficit present.     Mental Status: She is alert and oriented to person, place, and time.     Cranial Nerves: Cranial nerves are intact. No cranial nerve deficit, dysarthria or facial asymmetry.     Sensory: Sensation is intact. No sensory deficit.     Motor: No weakness, abnormal muscle tone or pronator drift.     Coordination: Coordination is intact. Coordination normal. Finger-Nose-Finger Test normal.  Psychiatric:        Mood and Affect: Mood normal.        Behavior: Behavior normal.        Thought Content: Thought content normal.        Judgment: Judgment normal.    ED Results / Procedures / Treatments   Labs (all labs ordered are listed, but only abnormal results are displayed) Labs Reviewed  CBC - Abnormal; Notable for the following components:      Result Value   Hemoglobin 11.7 (*)    HCT 35.3 (*)    All other components within normal limits  COMPREHENSIVE METABOLIC PANEL - Abnormal; Notable for the following components:   Sodium 129 (*)    Chloride 96 (*)    Creatinine, Ser 1.11 (*)    GFR, Estimated 51 (*)    All other  components within normal limits  LIPID PANEL - Abnormal; Notable for the following components:   Cholesterol 239 (*)    LDL Cholesterol 168 (*)    All other components within normal limits  RESP PANEL BY RT-PCR (FLU A&B, COVID) ARPGX2  PROTIME-INR  APTT  DIFFERENTIAL  URINALYSIS, ROUTINE W REFLEX MICROSCOPIC  HEMOGLOBIN Y6R  BASIC METABOLIC PANEL  CBG MONITORING, ED    EKG EKG Interpretation  Date/Time:  Wednesday August 02 2021 11:49:13 EDT Ventricular Rate:  69 PR Interval:  156 QRS Duration: 76 QT Interval:  412 QTC Calculation: 441 R Axis:   89 Text Interpretation: Normal sinus rhythm ST & T wave abnormality, consider inferior ischemia Abnormal ECG Confirmed by Godfrey Pick ([id number]) on 08/02/2021 12:03:02 PM  Radiology CT HEAD WO CONTRAST  Result Date: 08/02/2021 CLINICAL DATA:  Dizziness and slurred speech since last evening. EXAM: CT HEAD WITHOUT CONTRAST TECHNIQUE: Contiguous axial images were obtained from the base of the skull through the vertex without intravenous contrast. COMPARISON:  04/30/2017 FINDINGS: Brain: Stable age related cerebral atrophy, ventriculomegaly and periventricular white matter disease. No extra-axial fluid collections are identified. No CT findings for acute hemispheric infarction or intracranial hemorrhage. No mass lesions. The brainstem and cerebellum are normal. Vascular: Stable vascular calcifications. No aneurysm or hyperdense vessels. Skull: No skull fracture or bone lesions. Sinuses/Orbits: The paranasal sinuses and mastoid air cells are clear. The globes are intact. Other: No scalp lesions or scalp hematoma. IMPRESSION: 1. Stable age related cerebral atrophy, ventriculomegaly and periventricular white matter disease. 2. No acute intracranial findings or mass lesions. Electronically Signed   By: Marijo Sanes M.D.   On: 08/02/2021 13:12   CT Angio Neck W and/or Wo Contrast  Result  Date: 08/02/2021 CLINICAL DATA:  Stroke/TIA, assess extracranial  arteries EXAM: CT ANGIOGRAPHY NECK TECHNIQUE: Multidetector CT imaging of the neck was performed using the standard protocol during bolus administration of intravenous contrast. Multiplanar CT image reconstructions and MIPs were obtained to evaluate the vascular anatomy. Carotid stenosis measurements (when applicable) are obtained utilizing NASCET criteria, using the distal internal carotid diameter as the denominator. CONTRAST:  29mL OMNIPAQUE IOHEXOL 350 MG/ML SOLN COMPARISON:  None. FINDINGS: Aortic arch: Standard branching. Mild multifocal irregularity of the proximal left subclavian artery, which remains patent. Please see same-day CTA chest for additional thoracic vasculature findings. Right carotid system: No evidence of dissection, stenosis (50% or greater) or occlusion. Left carotid system: No evidence of dissection, stenosis (50% or greater) or occlusion. Vertebral arteries: No evidence of dissection, stenosis (50% or greater) or occlusion. Skeleton: No acute osseous abnormality. Other neck: Negative. Upper chest: For findings in the thorax, please see same day CTA chest. IMPRESSION: 1. No hemodynamically significant stenosis in the arteries of the neck. 2. For findings in the thorax, please see same-day CTA chest. Electronically Signed   By: Merilyn Baba M.D.   On: 08/02/2021 21:39   MR BRAIN WO CONTRAST  Result Date: 08/02/2021 CLINICAL DATA:  TIA.  Slurred speech. EXAM: MRI HEAD WITHOUT CONTRAST TECHNIQUE: Multiplanar, multiecho pulse sequences of the brain and surrounding structures were obtained without intravenous contrast. COMPARISON:  CT head 08/02/2021 FINDINGS: Brain: Negative for acute infarct. Mild to moderate atrophy. Mild white matter changes with scattered small deep white matter hyperintensities bilaterally. Negative for hemorrhage or mass. Vascular: Normal arterial flow voids. Skull and upper cervical spine: Negative Sinuses/Orbits: Negative Other: None IMPRESSION: No acute infarct  Atrophy and mild chronic microvascular ischemia. Electronically Signed   By: Franchot Gallo M.D.   On: 08/02/2021 19:27   CT Angio Chest/Abd/Pel for Dissection W and/or Wo Contrast  Result Date: 08/02/2021 CLINICAL DATA:  BP difference between both arms, evaluate for aortic dissection EXAM: CT ANGIOGRAPHY CHEST, ABDOMEN AND PELVIS TECHNIQUE: Non-contrast CT of the chest was initially obtained. Multidetector CT imaging through the chest, abdomen and pelvis was performed using the standard protocol during bolus administration of intravenous contrast. Multiplanar reconstructed images and MIPs were obtained and reviewed to evaluate the vascular anatomy. CONTRAST:  39mL OMNIPAQUE IOHEXOL 350 MG/ML SOLN COMPARISON:  Chest radiographs dated 12/06/2020. Low-dose lung cancer screening CT chest dated 07/06/2019. FINDINGS: CTA CHEST FINDINGS Cardiovascular: On unenhanced CT, there is no evidence of intramural hematoma. Following contrast administration, there is preferential opacification of the thoracic aorta. No evidence of thoracic aortic aneurysm or dissection. Although not tailored for evaluation of the pulmonary arteries, there is no evidence of pulmonary embolism to the segmental level. The heart is normal in size.  No pericardial effusion. Mild coronary atherosclerosis of the LAD. Mediastinum/Nodes: No suspicious mediastinal lymphadenopathy. Visualized thyroid is unremarkable. Lungs/Pleura: Moderate centrilobular and paraseptal emphysematous changes, upper lung predominant. Biapical pleural-parenchymal scarring. No suspicious pulmonary nodules. No focal consolidation. No pleural effusion or pneumothorax. Musculoskeletal: No focal osseous lesions. Review of the MIP images confirms the above findings. CTA ABDOMEN AND PELVIS FINDINGS VASCULAR Aorta: No evidence abdominal aortic aneurysm or dissection. Patent. Atherosclerotic calcifications. Celiac: Patent. SMA: Patent. Renals: Patent bilaterally IMA: Patent. Inflow:  Patent.  Atherosclerotic calcifications bilaterally. Veins: Grossly unremarkable. Review of the MIP images confirms the above findings. NON-VASCULAR Hepatobiliary: Liver is within normal limits. Gallbladder is unremarkable. No intrahepatic or extrahepatic ductal dilatation. Pancreas: Within normal limits. Spleen: Within normal limits. Adrenals/Urinary Tract: Adrenal glands  are within normal limits. Kidneys are within normal limits.  No hydronephrosis. Bladder is within normal limits. Stomach/Bowel: Stomach is notable for a small hiatal hernia. No evidence of bowel obstruction. Normal appendix (series 8/image 217). Sigmoid diverticulosis, without evidence of diverticulitis. Lymphatic: No suspicious abdominopelvic lymphadenopathy. Reproductive: Uterus is within normal limits. Bilateral ovaries are within normal limits. Other: No abdominopelvic ascites. Musculoskeletal: No focal osseous lesions. Review of the MIP images confirms the above findings. IMPRESSION: No evidence of thoracoabdominal aortic aneurysm or dissection. No acute cardiopulmonary abnormality. Sigmoid diverticulosis, without evidence of diverticulitis. Aortic Atherosclerosis (ICD10-I70.0) and Emphysema (ICD10-J43.9). Electronically Signed   By: Julian Hy M.D.   On: 08/02/2021 21:41    Procedures Procedures   Medications Ordered in ED Medications  lactated ringers infusion (0 mLs Intravenous Stopped 08/03/21 0904)  aspirin EC tablet 81 mg (has no administration in time range)  clopidogrel (PLAVIX) tablet 75 mg (has no administration in time range)  lactated ringers bolus 500 mL (0 mLs Intravenous Hold 08/03/21 0859)  rosuvastatin (CRESTOR) tablet 10 mg (has no administration in time range)  ezetimibe (ZETIA) tablet 10 mg (has no administration in time range)  sodium chloride flush (NS) 0.9 % injection 3 mL (3 mLs Intravenous Given 08/02/21 1205)  lactated ringers bolus 500 mL (0 mLs Intravenous Stopped 08/02/21 1838)  iohexol  (OMNIPAQUE) 350 MG/ML injection 75 mL (75 mLs Intravenous Contrast Given 08/02/21 2119)   stroke: mapping our early stages of recovery book ( Does not apply Given 08/02/21 2249)  aspirin tablet 325 mg (325 mg Oral Given 08/03/21 1007)    ED Course  I have reviewed the triage vital signs and the nursing notes.  Pertinent labs & imaging results that were available during my care of the patient were reviewed by me and considered in my medical decision making (see chart for details).    MDM Rules/Calculators/A&P                          Patient presents for transient episode of expressive aphasia last night.  She has been asymptomatic since that time.  She estimates the episode lasted for 30 minutes.  On arrival in the ED, vital signs are normal.  Patient is well-appearing.  She has no focal neurologic deficits at this time.  Work-up was initiated.  Noncontrasted CT scan of head showed no acute findings.  Given concern for TIA, I did discuss this patient with neurologist on-call.  He did recommend transfer to [facility] emergency department for TIA work-up.  MRI and CTA of neck were ordered.  Patient remained asymptomatic here in the ED.  She was agreeable to transfer to [facility].  Care of patient signed out to oncoming ED provider.  Final Clinical Impression(s) / ED Diagnoses Final diagnoses:  TIA (transient ischemic attack)    Rx / DC Orders ED Discharge Orders     None        Godfrey Pick, MD 08/03/21 1219    Godfrey Pick, MD 08/03/21 [phone]

## 2021-08-02 NOTE — H&P (Signed)
History and Physical    Alexandra House:[id number] DOB: [birth year] DOA: 08/02/2021  PCP: Janith Lima, MD  Patient coming from: Home  I have personally briefly reviewed patient's old medical records in [place]  Chief Complaint: slurred speech  HPI: Alexandra House is a 75 y.o. female with medical history significant for history of combined diastolic and systolic CHF, hyperlipidemia and osteoporosis who presents presents from outside ED with concerns of slurred speech and trouble word finding.  Patient reports that around 8 PM- 10pm last evening she had an episode of garbled speech and difficulty word finding especially when she was writing.  States event lasted for about an hour and then she just went to bed because she felt fatigue.  She woke up this morning with symptoms resolved and presented to the ED at the urging of her family.  She she notes some transient dizziness when moving in her bed last night.  She denies any chest pain or palpitations.  No headache or blurred vision.  No focal extremity weakness.  Denies any history of previous CVA. Uses aspirin sparingly.  Reports that yesterday she was more stressed than usual since her husband was told that he may have new diagnosis of cancer.  ED Course: She was afebrile and was not noted to have significant blood pressure difference between her left and right upper extremity.  She was normotensive to her right arm but had blood pressure reading and to systolic of 82X in her left arm.  She denies any history of numbness, tingling or weakness of her left arm. CBC shows no leukocytosis, hemoglobin of 11.7.  Sodium of 129, potassium of 4.2, creatinine of 1.11, BG of 87. CT head and MRI brain was negative.  CTA chest, abdomen and pelvis showed no thoracic aortic dissection but had mild multifocal irregularity of the proximal left subclavian artery.  EKG on my reveals show some slight V4 and V5 ST depression but this is similar to  prior.  Neurology was consulted by Dr. Doren Custard at [place] and recommendation was made for ED to ED transfer and hospitalist admission.    Review of Systems: Constitutional: No Weight Change, No Fever ENT/Mouth: No sore throat, No Rhinorrhea Eyes: No Eye Pain, No Vision Changes Cardiovascular: No Chest Pain, no SOB, No Palpitations Respiratory: No Cough Gastrointestinal: No Nausea, No Vomiting, No Diarrhea, No Constipation, No Pain Genitourinary: no Urinary Incontinence Musculoskeletal: No Arthralgias, No Myalgias Skin: No Skin Lesions, No Pruritus, Neuro: no Weakness, No Numbness,  No Loss of Consciousness, No Syncope Psych: No Anxiety/Panic Heme/Lymph: No Bruising, No Bleeding  Social history Patient lives with her husband.  Reports a pack a day of tobacco use for at least 10 years.  Denies any alcohol or illicit drug use. Past Medical History:  Diagnosis Date   Acute on chronic respiratory failure (HCC)    Anxiety    Caloric malnutrition (HCC)    Carotid stenosis    Coronary artery disease    Fibromyalgia    HLD (hyperlipidemia)    Hx of cardiovascular stress test    a. Lex MV 11/13: + significant ECG changes and CHB with infusion; EF 81%, no ischemia,    Hx of echocardiogram    a. Echo 12/12: EF 93-71%, grade 2 diastolic dysfunction, mild MR.; b. Echo 11/13:  mild LVH, EF 60-65%   Hypertension    Neuropathy    Osteoporosis    Syncope 08/2009   a. ETT-Echo 1/11:  poor ex tol,  submax exercise, no WMA or ECG changes c/w ischemia;      Past Surgical History:  Procedure Laterality Date   None       reports that she has been smoking cigarettes. She has been smoking an average of .5 packs per day. She uses smokeless tobacco. She reports current alcohol use. She reports that she does not use drugs. Social History  Allergies  Allergen Reactions   Bee Venom Anaphylaxis and Hives   Lipitor [Atorvastatin] Other (See Comments)    alopecia    Family History  Problem  Relation Age of Onset   CAD Mother 43       Status post PCI   Breast cancer Mother    Lung cancer Father    Colon cancer Neg Hx      Prior to Admission medications   Medication Sig Start Date End Date Taking? Authorizing Provider  alendronate (FOSAMAX) 70 MG tablet  02/27/18   [provider]  aspirin EC 81 MG tablet Take 81 mg by mouth daily.    [provider]  benzonatate (TESSALON) 100 MG capsule Take 1 capsule (100 mg total) by mouth 2 (two) times daily as needed for cough. 06/08/21   Janith Lima, MD  BREO ELLIPTA 100-25 MCG/INH AEPB Inhale 1 puff into the lungs daily. 12/06/20   Janith Lima, MD  Cholecalciferol 125 MCG (5000 UT) capsule Take 1 capsule (5,000 Units total) by mouth daily. 12/06/20   Janith Lima, MD  EPINEPHrine (EPIPEN JR) 0.15 MG/0.3ML injection Inject 15 mg into the muscle as needed. Patient not taking: Reported on 04/27/2021 05/28/17   [provider]  ergocalciferol (VITAMIN D2) 50000 units capsule Take 50,000 Units by mouth once a week.    [provider]  guaiFENesin (MUCINEX) 600 MG 12 hr tablet Take by mouth as needed.    [provider]  LORazepam (ATIVAN) 1 MG tablet TAKE 1 TABLET(1 MG) BY MOUTH EVERY 8 HOURS AS NEEDED FOR ANXIETY 07/21/21   Janith Lima, MD  Multiple Vitamins-Minerals (MULTIVITAMIN GUMMIES ADULT PO) Take 2 tablets by mouth daily.    [provider]  rosuvastatin (CRESTOR) 10 MG tablet Take 1 tablet (10 mg total) by mouth at bedtime. 12/06/20   Janith Lima, MD    Physical Exam: Vitals:   08/02/21 1815 08/02/21 1817 08/02/21 2000 08/02/21 2015  BP: 139/62 99/60 (!) 117/58 98/67  Pulse: 76 71 73 68  Resp: 17 17 19 14   Temp: 98.5 F (36.9 C)     TempSrc: Oral     SpO2: 99% 100% 98% 100%  Weight:      Height:        Constitutional: NAD, calm, comfortable, elderly female laying flat in bed Vitals:   08/02/21 1815 08/02/21 1817 08/02/21 2000 08/02/21 2015  BP: 139/62  99/60 (!) 117/58 98/67  Pulse: 76 71 73 68  Resp: 17 17 19 14   Temp: 98.5 F (36.9 C)     TempSrc: Oral     SpO2: 99% 100% 98% 100%  Weight:      Height:       Eyes:  lids and conjunctivae normal ENMT: Mucous membranes are moist.  Neck: normal, supple Respiratory: clear to auscultation bilaterally, no wheezing, no crackles. Normal respiratory effort. No accessory muscle use.  Cardiovascular: Regular rate and rhythm, no murmurs / rubs / gallops. No extremity edema.   Abdomen: no tenderness, no masses palpated. Bowel sounds positive.  Musculoskeletal: no clubbing /  cyanosis. No joint deformity upper and lower extremities. Good ROM, no contractures. Normal muscle tone.  Skin: no rashes, lesions, ulcers. No induration Neurologic: CN 2-12 grossly intact. Sensation intact, Strength 5/5 in all 4.  Intact heel-to-shin.  Intact Psychiatric: Normal judgment and insight. Alert and oriented x 3. Normal mood.     Labs on Admission: I have personally reviewed following labs and imaging studies  CBC: Recent Labs  Lab 08/02/21 1203  WBC 9.4  NEUTROABS 5.1  HGB 11.7*  HCT 35.3*  MCV 87.6  PLT 401   Basic Metabolic Panel: Recent Labs  Lab 08/02/21 1203  NA 129*  K 4.2  CL 96*  CO2 24  GLUCOSE 87  BUN 18  CREATININE 1.11*  CALCIUM 9.3   GFR: Estimated Creatinine Clearance: 25.5 mL/min (A) (by C-G formula based on SCr of 1.11 mg/dL (H)). Liver Function Tests: Recent Labs  Lab 08/02/21 1203  AST 19  ALT 8  ALKPHOS 67  BILITOT 0.4  PROT 7.1  ALBUMIN 3.9   No results for input(s): LIPASE, AMYLASE in the last 168 hours. No results for input(s): AMMONIA in the last 168 hours. Coagulation Profile: Recent Labs  Lab 08/02/21 1203  INR 0.9   Cardiac Enzymes: No results for input(s): CKTOTAL, CKMB, CKMBINDEX, TROPONINI in the last 168 hours. BNP (last 3 results) No results for input(s): PROBNP in the last 8760 hours. HbA1C: No results for input(s): HGBA1C in the last 72  hours. CBG: Recent Labs  Lab 08/02/21 1200  GLUCAP 98   Lipid Profile: No results for input(s): CHOL, HDL, LDLCALC, TRIG, CHOLHDL, LDLDIRECT in the last 72 hours. Thyroid Function Tests: No results for input(s): TSH, T4TOTAL, FREET4, T3FREE, THYROIDAB in the last 72 hours. Anemia Panel: No results for input(s): VITAMINB12, FOLATE, FERRITIN, TIBC, IRON, RETICCTPCT in the last 72 hours. Urine analysis:    Component Value Date/Time   COLORURINE YELLOW 08/02/2021 [place] 08/02/2021 1734   LABSPEC 1.013 08/02/2021 1734   PHURINE 6.5 08/02/2021 1734   GLUCOSEU NEGATIVE 08/02/2021 1734   HGBUR NEGATIVE 08/02/2021 1734   BILIRUBINUR NEGATIVE 08/02/2021 1734   KETONESUR NEGATIVE 08/02/2021 1734   PROTEINUR NEGATIVE 08/02/2021 1734   NITRITE NEGATIVE 08/02/2021 1734   LEUKOCYTESUR NEGATIVE 08/02/2021 1734    Radiological Exams on Admission: CT HEAD WO CONTRAST  Result Date: 08/02/2021 CLINICAL DATA:  Dizziness and slurred speech since last evening. EXAM: CT HEAD WITHOUT CONTRAST TECHNIQUE: Contiguous axial images were obtained from the base of the skull through the vertex without intravenous contrast. COMPARISON:  04/30/2017 FINDINGS: Brain: Stable age related cerebral atrophy, ventriculomegaly and periventricular white matter disease. No extra-axial fluid collections are identified. No CT findings for acute hemispheric infarction or intracranial hemorrhage. No mass lesions. The brainstem and cerebellum are normal. Vascular: Stable vascular calcifications. No aneurysm or hyperdense vessels. Skull: No skull fracture or bone lesions. Sinuses/Orbits: The paranasal sinuses and mastoid air cells are clear. The globes are intact. Other: No scalp lesions or scalp hematoma. IMPRESSION: 1. Stable age related cerebral atrophy, ventriculomegaly and periventricular white matter disease. 2. No acute intracranial findings or mass lesions. Electronically Signed   By: Marijo Sanes M.D.    On: 08/02/2021 13:12   CT Angio Neck W and/or Wo Contrast  Result Date: 08/02/2021 CLINICAL DATA:  Stroke/TIA, assess extracranial arteries EXAM: CT ANGIOGRAPHY NECK TECHNIQUE: Multidetector CT imaging of the neck was performed using the standard protocol during bolus administration of intravenous contrast. Multiplanar CT image reconstructions and MIPs  were obtained to evaluate the vascular anatomy. Carotid stenosis measurements (when applicable) are obtained utilizing NASCET criteria, using the distal internal carotid diameter as the denominator. CONTRAST:  8mL OMNIPAQUE IOHEXOL 350 MG/ML SOLN COMPARISON:  None. FINDINGS: Aortic arch: Standard branching. Mild multifocal irregularity of the proximal left subclavian artery, which remains patent. Please see same-day CTA chest for additional thoracic vasculature findings. Right carotid system: No evidence of dissection, stenosis (50% or greater) or occlusion. Left carotid system: No evidence of dissection, stenosis (50% or greater) or occlusion. Vertebral arteries: No evidence of dissection, stenosis (50% or greater) or occlusion. Skeleton: No acute osseous abnormality. Other neck: Negative. Upper chest: For findings in the thorax, please see same day CTA chest. IMPRESSION: 1. No hemodynamically significant stenosis in the arteries of the neck. 2. For findings in the thorax, please see same-day CTA chest. Electronically Signed   By: Merilyn Baba M.D.   On: 08/02/2021 21:39   MR BRAIN WO CONTRAST  Result Date: 08/02/2021 CLINICAL DATA:  TIA.  Slurred speech. EXAM: MRI HEAD WITHOUT CONTRAST TECHNIQUE: Multiplanar, multiecho pulse sequences of the brain and surrounding structures were obtained without intravenous contrast. COMPARISON:  CT head 08/02/2021 FINDINGS: Brain: Negative for acute infarct. Mild to moderate atrophy. Mild white matter changes with scattered small deep white matter hyperintensities bilaterally. Negative for hemorrhage or mass. Vascular:  Normal arterial flow voids. Skull and upper cervical spine: Negative Sinuses/Orbits: Negative Other: None IMPRESSION: No acute infarct Atrophy and mild chronic microvascular ischemia. Electronically Signed   By: Franchot Gallo M.D.   On: 08/02/2021 19:27   CT Angio Chest/Abd/Pel for Dissection W and/or Wo Contrast  Result Date: 08/02/2021 CLINICAL DATA:  BP difference between both arms, evaluate for aortic dissection EXAM: CT ANGIOGRAPHY CHEST, ABDOMEN AND PELVIS TECHNIQUE: Non-contrast CT of the chest was initially obtained. Multidetector CT imaging through the chest, abdomen and pelvis was performed using the standard protocol during bolus administration of intravenous contrast. Multiplanar reconstructed images and MIPs were obtained and reviewed to evaluate the vascular anatomy. CONTRAST:  27mL OMNIPAQUE IOHEXOL 350 MG/ML SOLN COMPARISON:  Chest radiographs dated 12/06/2020. Low-dose lung cancer screening CT chest dated 07/06/2019. FINDINGS: CTA CHEST FINDINGS Cardiovascular: On unenhanced CT, there is no evidence of intramural hematoma. Following contrast administration, there is preferential opacification of the thoracic aorta. No evidence of thoracic aortic aneurysm or dissection. Although not tailored for evaluation of the pulmonary arteries, there is no evidence of pulmonary embolism to the segmental level. The heart is normal in size.  No pericardial effusion. Mild coronary atherosclerosis of the LAD. Mediastinum/Nodes: No suspicious mediastinal lymphadenopathy. Visualized thyroid is unremarkable. Lungs/Pleura: Moderate centrilobular and paraseptal emphysematous changes, upper lung predominant. Biapical pleural-parenchymal scarring. No suspicious pulmonary nodules. No focal consolidation. No pleural effusion or pneumothorax. Musculoskeletal: No focal osseous lesions. Review of the MIP images confirms the above findings. CTA ABDOMEN AND PELVIS FINDINGS VASCULAR Aorta: No evidence abdominal aortic  aneurysm or dissection. Patent. Atherosclerotic calcifications. Celiac: Patent. SMA: Patent. Renals: Patent bilaterally IMA: Patent. Inflow: Patent.  Atherosclerotic calcifications bilaterally. Veins: Grossly unremarkable. Review of the MIP images confirms the above findings. NON-VASCULAR Hepatobiliary: Liver is within normal limits. Gallbladder is unremarkable. No intrahepatic or extrahepatic ductal dilatation. Pancreas: Within normal limits. Spleen: Within normal limits. Adrenals/Urinary Tract: Adrenal glands are within normal limits. Kidneys are within normal limits.  No hydronephrosis. Bladder is within normal limits. Stomach/Bowel: Stomach is notable for a small hiatal hernia. No evidence of bowel obstruction. Normal appendix (series 8/image 217). Sigmoid diverticulosis, without  evidence of diverticulitis. Lymphatic: No suspicious abdominopelvic lymphadenopathy. Reproductive: Uterus is within normal limits. Bilateral ovaries are within normal limits. Other: No abdominopelvic ascites. Musculoskeletal: No focal osseous lesions. Review of the MIP images confirms the above findings. IMPRESSION: No evidence of thoracoabdominal aortic aneurysm or dissection. No acute cardiopulmonary abnormality. Sigmoid diverticulosis, without evidence of diverticulitis. Aortic Atherosclerosis (ICD10-I70.0) and Emphysema (ICD10-J43.9). Electronically Signed   By: Julian Hy M.D.   On: 08/02/2021 21:41      Assessment/Plan  Suspected TIA -pt reports symptoms of trouble word finding last evening with resolved symptoms today.  Had negative CT head, MRI brain.  No large vessel occlusion on CTA neck. -neurology consulted - echocardiogram  - start daily aspirin and Plavix per neurology -Obtain A1c and lipids- add statin if LDL >70 -PT/OT/SLT -Frequent neuro checks and keep on telemetry -Allow for permissive hypertension with blood pressure treatment as needed only if BP goes above 220/110  Supected peripheral  vascular disease of the left upper extremity -CTA chest showed no dissection mild multifocal irregularity of the proximal left subclavian artery -check lipid panel -encouraged cessation of tobacco use  Hyponatremia - Sodium of 129 admit.  Continue gentle IV fluid hydration follow with repeat labs in the morning  AKI - Creatinine mildly elevated 1.11.  Follow repeat creatinine following IV fluid hydration  Combined diastolic and systolic CHF -Last echocardiogram on 06/2018 with EF 55 to 60% and grade 1 diastolic dysfunction -Obtain repeat echocardiogram for stroke work-up  Hyperlipidemia Check lipid panel  DVT prophylaxis:.Lovenox Code Status: Full Family Communication: Plan discussed with patient at bedside  disposition Plan: Home with observation Consults called: Neurology Admission status: Observation     Level of care: Telemetry Cardiac  Status is: Observation  DVT prophylaxis:.Lovenox Code Status: Full Family Communication: Plan discussed with patient at bedside  disposition Plan: Home with observation Consults called: Neurology Admission status: Observation  The patient remains OBS appropriate and will d/c before 2 midnights.        Orene Desanctis DO [facility]   If 7PM-7AM, please contact night-coverage [email or web address]   08/02/2021, 10:50 PM

## 2021-08-03 ENCOUNTER — Observation Stay (HOSPITAL_COMMUNITY): Payer: Medicare HMO

## 2021-08-03 ENCOUNTER — Other Ambulatory Visit: Payer: Self-pay

## 2021-08-03 ENCOUNTER — Encounter (HOSPITAL_COMMUNITY): Payer: Self-pay | Admitting: Family Medicine

## 2021-08-03 ENCOUNTER — Observation Stay (HOSPITAL_BASED_OUTPATIENT_CLINIC_OR_DEPARTMENT_OTHER): Payer: Medicare HMO

## 2021-08-03 ENCOUNTER — Other Ambulatory Visit (HOSPITAL_COMMUNITY): Payer: Medicare HMO

## 2021-08-03 DIAGNOSIS — G459 Transient cerebral ischemic attack, unspecified: Secondary | ICD-10-CM | POA: Diagnosis not present

## 2021-08-03 DIAGNOSIS — F17200 Nicotine dependence, unspecified, uncomplicated: Secondary | ICD-10-CM | POA: Diagnosis not present

## 2021-08-03 DIAGNOSIS — E785 Hyperlipidemia, unspecified: Secondary | ICD-10-CM

## 2021-08-03 DIAGNOSIS — R4701 Aphasia: Secondary | ICD-10-CM | POA: Diagnosis not present

## 2021-08-03 DIAGNOSIS — E871 Hypo-osmolality and hyponatremia: Secondary | ICD-10-CM | POA: Diagnosis not present

## 2021-08-03 LAB — ECHOCARDIOGRAM COMPLETE
Area-P 1/2: 2.84 cm2
Height: 59 in
S' Lateral: 2.6 cm
Weight: 1344 oz

## 2021-08-03 LAB — LIPID PANEL
Cholesterol: 239 mg/dL — ABNORMAL HIGH (ref 0–200)
HDL: 56 mg/dL (ref >40)
LDL Cholesterol: 168 mg/dL — ABNORMAL HIGH (ref 0–99)
Total CHOL/HDL Ratio: 4.3 RATIO
Triglycerides: 75 mg/dL (ref <150)
VLDL: 15 mg/dL (ref 0–40)

## 2021-08-03 LAB — HEMOGLOBIN A1C
Hgb A1c MFr Bld: 5.2 % (ref 4.8–5.6)
Mean Plasma Glucose: 102.54 mg/dL

## 2021-08-03 MED ORDER — LACTATED RINGERS IV BOLUS: 500.0000 mL | Freq: Once | INTRAVENOUS | Status: DC

## 2021-08-03 MED ORDER — ASPIRIN EC 81 MG PO TBEC: 81.0000 mg | DELAYED_RELEASE_TABLET | Freq: Every day | ORAL | 0 refills | Status: DC

## 2021-08-03 MED ORDER — ASPIRIN EC 81 MG PO TBEC: 81.0000 mg | DELAYED_RELEASE_TABLET | Freq: Every day | ORAL | Status: DC

## 2021-08-03 MED ORDER — ROSUVASTATIN CALCIUM 10 MG PO TABS: 10.0000 mg | ORAL_TABLET | Freq: Every day | ORAL | 0 refills | Status: DC

## 2021-08-03 MED ORDER — EZETIMIBE 10 MG PO TABS: 10.0000 mg | ORAL_TABLET | Freq: Every day | ORAL | 0 refills | Status: DC

## 2021-08-03 MED ORDER — ROSUVASTATIN CALCIUM 5 MG PO TABS
10.0000 mg | ORAL_TABLET | Freq: Every day | ORAL | Status: DC
  Administered 2021-08-03: 10 mg via ORAL
  Filled 2021-08-03: qty 2

## 2021-08-03 MED ORDER — CLOPIDOGREL BISULFATE 75 MG PO TABS: 75.0000 mg | ORAL_TABLET | Freq: Every day | ORAL | 0 refills | Status: DC

## 2021-08-03 MED ORDER — ROSUVASTATIN CALCIUM 20 MG PO TABS: 40.0000 mg | ORAL_TABLET | Freq: Every day | ORAL | Status: DC

## 2021-08-03 MED ORDER — EZETIMIBE 10 MG PO TABS
10.0000 mg | ORAL_TABLET | Freq: Every day | ORAL | Status: DC
  Administered 2021-08-03: 10 mg via ORAL
  Filled 2021-08-03: qty 1

## 2021-08-03 MED ORDER — CLOPIDOGREL BISULFATE 75 MG PO TABS
75.0000 mg | ORAL_TABLET | Freq: Every day | ORAL | Status: DC
  Administered 2021-08-03: 75 mg via ORAL
  Filled 2021-08-03: qty 1

## 2021-08-03 NOTE — Progress Notes (Addendum)
STROKE TEAM PROGRESS NOTE   ATTENDING NOTE: I reviewed above note and agree with the assessment and plan. Pt was seen and examined.   75 year old female with history of CHF, HLD, smoker admitted for episode of word finding difficulty, slurred speech, lasted 10 to 15 minutes.  And also found to have different blood pressure into her arms.  CTA chest abdomen pelvis ruled out aortic dissection.  However CTA chest and CTA neck showed left subclavian artery stenosis which would explain her blood pressure on the left arm 90s, right arm 150s.  MRA negative.  EF 60 to 65%, EEG showed cortical dysfunction in left >right temporal region which is nonspecific in etiology.  No AED needed at this time.  LDL 168, A1c 5.2.  Creatinine 1.11  On exam, patient neurology intact, awake alert orientated and no aphasia, no focal deficit.  Patient etiology for the speech difficulty, word finding difficulty likely TIA, recommend aspirin 81 and Plavix 75 DAPT for 3 weeks and then Plavix alone.  Given high LDL, discussed with pharmacy, will continue current Crestor 10 but will add Zetia 10.  Recommend 30-day CardioNet monitor as outpatient to rule out A. Fib (messaged cardmaster).  Regarding her left subclavian artery stenosis, prefer her stay for vascular surgery consultation, however patient determined to go home today, will refer to vascular surgery as outpatient follow-up.  She will need follow-up with outpatient stroke clinic also.  For detailed assessment and plan, please refer to above as I have made changes wherever appropriate.   Neurology will sign off. Please call with questions. Pt will follow up with stroke clinic NP at [facility] in about 4 weeks. Thanks for the consult.   Alexandra Hawking, MD PhD Stroke Neurology 08/03/2021 6:59 PM    INTERVAL HISTORY Her husband is at the bedside.  Patient is seen in the ED.  She is hemodynamically stable   Vitals:   08/03/21 0400 08/03/21 0630 08/03/21 0733 08/03/21 0800  BP:  124/89 (!) 149/62 (!) 163/61 (!) 165/66  Pulse: 80 64 63 65  Resp: 18 17 (!) 22 (!) 21  Temp:   97.7 F (36.5 C)   TempSrc:      SpO2: 96% 98% 94% 94%  Weight:      Height:       CBC:  Recent Labs  Lab 08/02/21 1203  WBC 9.4  NEUTROABS 5.1  HGB 11.7*  HCT 35.3*  MCV 87.6  PLT 702   Basic Metabolic Panel:  Recent Labs  Lab 08/02/21 1203  NA 129*  K 4.2  CL 96*  CO2 24  GLUCOSE 87  BUN 18  CREATININE 1.11*  CALCIUM 9.3   Lipid Panel:  Recent Labs  Lab 08/03/21 0354  CHOL 239*  TRIG 75  HDL 56  CHOLHDL 4.3  VLDL 15  LDLCALC 168*   HgbA1c:  Recent Labs  Lab 08/03/21 0354  HGBA1C 5.2   Urine Drug Screen: No results for input(s): LABOPIA, COCAINSCRNUR, LABBENZ, AMPHETMU, THCU, LABBARB in the last 168 hours.  Alcohol Level No results for input(s): ETH in the last 168 hours.  IMAGING past 24 hours CT HEAD WO CONTRAST  Result Date: 08/02/2021 CLINICAL DATA:  Dizziness and slurred speech since last evening. EXAM: CT HEAD WITHOUT CONTRAST TECHNIQUE: Contiguous axial images were obtained from the base of the skull through the vertex without intravenous contrast. COMPARISON:  04/30/2017 FINDINGS: Brain: Stable age related cerebral atrophy, ventriculomegaly and periventricular white matter disease. No extra-axial fluid collections are identified. No  CT findings for acute hemispheric infarction or intracranial hemorrhage. No mass lesions. The brainstem and cerebellum are normal. Vascular: Stable vascular calcifications. No aneurysm or hyperdense vessels. Skull: No skull fracture or bone lesions. Sinuses/Orbits: The paranasal sinuses and mastoid air cells are clear. The globes are intact. Other: No scalp lesions or scalp hematoma. IMPRESSION: 1. Stable age related cerebral atrophy, ventriculomegaly and periventricular white matter disease. 2. No acute intracranial findings or mass lesions. Electronically Signed   By: Marijo Sanes M.D.   On: 08/02/2021 13:12   CT Angio  Neck W and/or Wo Contrast  Result Date: 08/02/2021 CLINICAL DATA:  Stroke/TIA, assess extracranial arteries EXAM: CT ANGIOGRAPHY NECK TECHNIQUE: Multidetector CT imaging of the neck was performed using the standard protocol during bolus administration of intravenous contrast. Multiplanar CT image reconstructions and MIPs were obtained to evaluate the vascular anatomy. Carotid stenosis measurements (when applicable) are obtained utilizing NASCET criteria, using the distal internal carotid diameter as the denominator. CONTRAST:  50mL OMNIPAQUE IOHEXOL 350 MG/ML SOLN COMPARISON:  None. FINDINGS: Aortic arch: Standard branching. Mild multifocal irregularity of the proximal left subclavian artery, which remains patent. Please see same-day CTA chest for additional thoracic vasculature findings. Right carotid system: No evidence of dissection, stenosis (50% or greater) or occlusion. Left carotid system: No evidence of dissection, stenosis (50% or greater) or occlusion. Vertebral arteries: No evidence of dissection, stenosis (50% or greater) or occlusion. Skeleton: No acute osseous abnormality. Other neck: Negative. Upper chest: For findings in the thorax, please see same day CTA chest. IMPRESSION: 1. No hemodynamically significant stenosis in the arteries of the neck. 2. For findings in the thorax, please see same-day CTA chest. Electronically Signed   By: Merilyn Baba M.D.   On: 08/02/2021 21:39   MR BRAIN WO CONTRAST  Result Date: 08/02/2021 CLINICAL DATA:  TIA.  Slurred speech. EXAM: MRI HEAD WITHOUT CONTRAST TECHNIQUE: Multiplanar, multiecho pulse sequences of the brain and surrounding structures were obtained without intravenous contrast. COMPARISON:  CT head 08/02/2021 FINDINGS: Brain: Negative for acute infarct. Mild to moderate atrophy. Mild white matter changes with scattered small deep white matter hyperintensities bilaterally. Negative for hemorrhage or mass. Vascular: Normal arterial flow voids. Skull  and upper cervical spine: Negative Sinuses/Orbits: Negative Other: None IMPRESSION: No acute infarct Atrophy and mild chronic microvascular ischemia. Electronically Signed   By: Franchot Gallo M.D.   On: 08/02/2021 19:27   CT Angio Chest/Abd/Pel for Dissection W and/or Wo Contrast  Result Date: 08/02/2021 CLINICAL DATA:  BP difference between both arms, evaluate for aortic dissection EXAM: CT ANGIOGRAPHY CHEST, ABDOMEN AND PELVIS TECHNIQUE: Non-contrast CT of the chest was initially obtained. Multidetector CT imaging through the chest, abdomen and pelvis was performed using the standard protocol during bolus administration of intravenous contrast. Multiplanar reconstructed images and MIPs were obtained and reviewed to evaluate the vascular anatomy. CONTRAST:  79mL OMNIPAQUE IOHEXOL 350 MG/ML SOLN COMPARISON:  Chest radiographs dated 12/06/2020. Low-dose lung cancer screening CT chest dated 07/06/2019. FINDINGS: CTA CHEST FINDINGS Cardiovascular: On unenhanced CT, there is no evidence of intramural hematoma. Following contrast administration, there is preferential opacification of the thoracic aorta. No evidence of thoracic aortic aneurysm or dissection. Although not tailored for evaluation of the pulmonary arteries, there is no evidence of pulmonary embolism to the segmental level. The heart is normal in size.  No pericardial effusion. Mild coronary atherosclerosis of the LAD. Mediastinum/Nodes: No suspicious mediastinal lymphadenopathy. Visualized thyroid is unremarkable. Lungs/Pleura: Moderate centrilobular and paraseptal emphysematous changes, upper lung predominant. Biapical  pleural-parenchymal scarring. No suspicious pulmonary nodules. No focal consolidation. No pleural effusion or pneumothorax. Musculoskeletal: No focal osseous lesions. Review of the MIP images confirms the above findings. CTA ABDOMEN AND PELVIS FINDINGS VASCULAR Aorta: No evidence abdominal aortic aneurysm or dissection. Patent.  Atherosclerotic calcifications. Celiac: Patent. SMA: Patent. Renals: Patent bilaterally IMA: Patent. Inflow: Patent.  Atherosclerotic calcifications bilaterally. Veins: Grossly unremarkable. Review of the MIP images confirms the above findings. NON-VASCULAR Hepatobiliary: Liver is within normal limits. Gallbladder is unremarkable. No intrahepatic or extrahepatic ductal dilatation. Pancreas: Within normal limits. Spleen: Within normal limits. Adrenals/Urinary Tract: Adrenal glands are within normal limits. Kidneys are within normal limits.  No hydronephrosis. Bladder is within normal limits. Stomach/Bowel: Stomach is notable for a small hiatal hernia. No evidence of bowel obstruction. Normal appendix (series 8/image 217). Sigmoid diverticulosis, without evidence of diverticulitis. Lymphatic: No suspicious abdominopelvic lymphadenopathy. Reproductive: Uterus is within normal limits. Bilateral ovaries are within normal limits. Other: No abdominopelvic ascites. Musculoskeletal: No focal osseous lesions. Review of the MIP images confirms the above findings. IMPRESSION: No evidence of thoracoabdominal aortic aneurysm or dissection. No acute cardiopulmonary abnormality. Sigmoid diverticulosis, without evidence of diverticulitis. Aortic Atherosclerosis (ICD10-I70.0) and Emphysema (ICD10-J43.9). Electronically Signed   By: Julian Hy M.D.   On: 08/02/2021 21:41    PHYSICAL EXAM General: Patient is a well-nourished female in no acute distress.  Neurological exam:  NEURO:  Mental Status: AA&Ox3  Speech/Language: speech is without dysarthria or aphasia.  Naming, repetition, fluency, and comprehension intact.  Cranial Nerves:  III, IV, VI:  Eyelids elevate symmetrically.  V: Sensation is intact to light touch and symmetrical to face.  VII: Smile is symmetrical. Able to raise eyebrows.  VIII: hearing intact to voice. IX, X: Phonation is normal.  XII: tongue is midline without fasciculations. Motor: 5/5  strength to all muscle groups tested.  Tone: is normal and bulk is normal Sensation- Intact to light touch bilaterally. Extinction absent to light touch  Coordination: No drift.  Gait- deferred   ASSESSMENT/PLAN Alexandra House is a 75 y.o. female with history of CHF, HTN, HLD and peripheral vascular disease presenting with hypotension, slurred speech, difficulty finding words, dizziness and fatigue. This episode reportedly occurred after patient had smoked two packs of cigarettes, and symptoms resolved spontaneously. No acute abnormalities noted on head CT, CTA or MRA.  TIA:  Code Stroke CT head No acute abnormality. Age-appropriate atrophy.  CTA head & neck No significant stenosis of arteries of the neck.   MRI BRAIN: no acute infarct.  MRA  No stenosis or occlusion, no aneurysm identified 2D Echo EF 55%-60%, hypokinesis of basal inferior and inferoseptal myocardium, grade 1 diastolic dysfunction, moderate mitral regurgitation EEG done to rule out seizures suggestive of cortical dysfunction in left >right temporal region which is nonspecific in etiology.  No AED needed at this time.  LDL 168 HgbA1c 5.2 VTE prophylaxis - per primary team No antithrombotic prior to admission, now on aspirin 81 mg daily and clopidogrel 75 mg daily. For 3 weeks then continue with aspirin 81mg  only.  Therapy recommendations:   no OT Follow up. No PT follow up.  Disposition: home   Left subclavian stenosis BP left arm 90s, right arm 150s CTA chest abdomen pelvis without dissection CTA chest and CTA neck showed left subclavian moderate to severe stenosis. Recommend vascular surgery consultation, however patient does not want to stay overnight and determined to go home today.  Will recommend vascular surgery referral as outpatient.  Discussed with Dr. Waldron Labs.  Hypertension Home meds:  none Stable Long-term BP goal normotensive  Hyperlipidemia Home meds: Crestor 10 LDL 168, goal < 70 Add  rosuvastatin 10 mg daily, zetia 10 mg daily High intensity statin started Continue statin at discharge  Other Stroke Risk Factors Advanced Age >/= 65  0.5 PPD current Cigarette smoker, strongly advised to stop smoking Substance abuse - UDS:  pending Congestive heart failure  Other Active Problems Peripheral vascular disease Patient was instructed to quit smoking Follow up with outpatient provider  CHF EF 55-60% Follow up with outpatient provider  Hospital day # 0    To contact Stroke Continuity provider, please refer to [email or web address] After hours, contact General Neurology

## 2021-08-03 NOTE — Procedures (Signed)
Patient Name: Alexandra House  MRN: [id number]  Epilepsy Attending: Lora Havens  Referring Physician/Provider: Dr Lynnae Sandhoff Date: 08/03/2021 Duration: 22.35 mins  Patient history: .  75 year old female with transient episode of aphasia.  EEG evaluate for seizure.  Level of alertness: Awake,   AEDs during EEG study: None  Technical aspects: This EEG study was done with scalp electrodes positioned according to the 10-20 International system of electrode placement. Electrical activity was acquired at a sampling rate of 500Hz  and reviewed with a high frequency filter of 70Hz  and a low frequency filter of 1Hz . EEG data were recorded continuously and digitally stored.   Description: The posterior dominant rhythm consists of 8-9 Hz activity of moderate voltage (25-35 uV) seen predominantly in posterior head regions, symmetric and reactive to eye opening and eye closing. Intermittent left> right temporal region 2 to 3 Hz delta slowing was also noted. Hyperventilation and photic stimulation were not performed.     Of note, study was technically difficult due to significant movement and electrode artifact.  ABNORMALITY -Intermittent slow, left.right temporal region  IMPRESSION: This technically difficult study is suggestive of cortical dysfunction in left>right temporal region which is nonspecific in etiology.  No seizures or epileptiform discharges were seen throughout the recording.  Duc Crocket Barbra Sarks

## 2021-08-03 NOTE — Evaluation (Signed)
Physical Therapy Evaluation Patient Details Name: Alexandra House MRN: [id number] DOB: 1946-02-28 Today's Date: 08/03/2021  History of Present Illness  Pt is a 75 y/o female admitted 10/19 secondary to aphasia that has since resolved. Imaging negative. PMH includes CHF, osteoporosis and CAD.  Clinical Impression  Pt admitted secondary to problem above with deficits below. Pt requiring min A for steadying to stand from chair and min guard A for ambulation with HHA. Educated about using RW at home to increase safety. Feel pt will progress well and will not require follow up PT. Will continue to follow acutely.        Recommendations for follow up therapy are one component of a multi-disciplinary discharge planning process, led by the attending physician.  Recommendations may be updated based on patient status, additional functional criteria and insurance authorization.  Follow Up Recommendations No PT follow up    Equipment Recommendations  None recommended by PT (reports she already has RW)    Recommendations for Other Services       Precautions / Restrictions Precautions Precautions: Fall Restrictions Weight Bearing Restrictions: No      Mobility  Bed Mobility Overal bed mobility: Needs Assistance Bed Mobility: Supine to Sit     Supine to sit: Supervision     General bed mobility comments: Supervision for safety.    Transfers Overall transfer level: Needs assistance Equipment used: 1 person hand held assist Transfers: Sit to/from Stand Sit to Stand: Min assist         General transfer comment: Min A for steadying to stand from stretcher.  Ambulation/Gait Ambulation/Gait assistance: Min guard Gait Distance (Feet): 115 Feet Assistive device: 1 person hand held assist Gait Pattern/deviations: Step-through pattern;Decreased stride length Gait velocity: Decreased   General Gait Details: Slow, guarded gait. No overt LOB noted. Pt reports some weakness bilaterally.  Educated about using RW at home for increased safety.  Stairs            Wheelchair Mobility    Modified Rankin (Stroke Patients Only)       Balance Overall balance assessment: Needs assistance Sitting-balance support: No upper extremity supported;Feet supported Sitting balance-Leahy Scale: Fair     Standing balance support: Single extremity supported;During functional activity Standing balance-Leahy Scale: Poor Standing balance comment: Reliant on at least 1 UE support                             Pertinent Vitals/Pain Pain Assessment: No/denies pain    Home Living Family/patient expects to be discharged to:: Private residence Living Arrangements: Spouse/significant other Available Help at Discharge: Family Type of Home: House Home Access: Stairs to enter Entrance Stairs-Rails: Left;Right;Can reach both Entrance Stairs-Number of Steps: 15 Home Layout: Two level;Able to live on main level with bedroom/bathroom Home Equipment: Shower seat;Walker - 2 wheels      Prior Function Level of Independence: Independent with assistive device(s)         Comments: Uses RW occasionally     Hand Dominance        Extremity/Trunk Assessment   Upper Extremity Assessment Upper Extremity Assessment: Defer to OT evaluation    Lower Extremity Assessment Lower Extremity Assessment: Generalized weakness    Cervical / Trunk Assessment Cervical / Trunk Assessment: Normal  Communication   Communication: No difficulties  Cognition Arousal/Alertness: Awake/alert Behavior During Therapy: WFL for tasks assessed/performed;Anxious Overall Cognitive Status: No family/caregiver present to determine baseline cognitive functioning  General Comments: Slightly anxious during mobility tasks.      General Comments      Exercises     Assessment/Plan    PT Assessment Patient needs continued PT services  PT Problem List  Decreased strength;Decreased balance;Decreased mobility;Decreased knowledge of precautions;Decreased safety awareness       PT Treatment Interventions DME instruction;Gait training;Stair training;Therapeutic activities;Functional mobility training;Therapeutic exercise;Patient/family education;Balance training    PT Goals (Current goals can be found in the Care Plan section)  Acute Rehab PT Goals Patient Stated Goal: to go home PT Goal Formulation: With patient Time For Goal Achievement: 08/17/21 Potential to Achieve Goals: Good    Frequency Min 3X/week   Barriers to discharge        Co-evaluation               AM-PAC PT "6 Clicks" Mobility  Outcome Measure Help needed turning from your back to your side while in a flat bed without using bedrails?: None Help needed moving from lying on your back to sitting on the side of a flat bed without using bedrails?: None Help needed moving to and from a bed to a chair (including a wheelchair)?: A Little Help needed standing up from a chair using your arms (e.g., wheelchair or bedside chair)?: A Little Help needed to walk in hospital room?: A Little Help needed climbing 3-5 steps with a railing? : A Little 6 Click Score: 20    End of Session Equipment Utilized During Treatment: Gait belt Activity Tolerance: Patient tolerated treatment well Patient left: in chair;Other (comment) (In chair with OT present) Nurse Communication: Mobility status PT Visit Diagnosis: Unsteadiness on feet (R26.81);Muscle weakness (generalized) (M62.81)    Time: 1505-6979 PT Time Calculation (min) (ACUTE ONLY): 12 min   Charges:   PT Evaluation $PT Eval Low Complexity: 1 Low          Lou Miner, DPT  Acute Rehabilitation Services  Pager: [phone] Office: [phone]   Rudean Hitt 08/03/2021, 12:57 PM

## 2021-08-03 NOTE — Evaluation (Signed)
Occupational Therapy Treatment Patient Details Name: Alexandra House MRN: [id number] DOB: 04-13-1946 Today's Date: 08/03/2021   History of present illness Pt is a 75 y/o female admitted 10/19 secondary to aphasia that has since resolved. Imaging negative. PMH includes CHF, osteoporosis and CAD.   OT comments  Patient admitted for the diagnosis above.  PTA she lives with her spouse, who is able to assist as needed.  She is able to walk at without an assistive device, and completes her own ADL/IADL without assist.  She does drive locally, and is able to care for her meds.  Currently she is a little unsteady on her feet, but is close to baseline.  No acute or post acute OT needs identified.  Recommend home with spouse and PRN supervision.     Recommendations for follow up therapy are one component of a multi-disciplinary discharge planning process, led by the attending physician.  Recommendations may be updated based on patient status, additional functional criteria and insurance authorization.    Follow Up Recommendations  No OT follow up    Equipment Recommendations  None recommended by OT    Recommendations for Other Services      Precautions / Restrictions Precautions Precautions: Fall Restrictions Weight Bearing Restrictions: No       Mobility Bed Mobility Overal bed mobility: Needs Assistance Bed Mobility: Supine to Sit     Supine to sit: Supervision     General bed mobility comments: Supervision for safety.    Transfers Overall transfer level: Needs assistance Equipment used: 1 person hand held assist Transfers: Sit to/from Stand Sit to Stand: Min assist         General transfer comment: Min A for steadying to stand from stretcher.    Balance Overall balance assessment: Needs assistance Sitting-balance support: No upper extremity supported;Feet supported Sitting balance-Leahy Scale: Good     Standing balance support: Single extremity supported Standing  balance-Leahy Scale: Fair Standing balance comment: prefers to hold onto something for stability                           ADL either performed or assessed with clinical judgement   ADL Overall ADL's : Modified independent                                       General ADL Comments: reaching out for objects in her environment to stabilize herself in standing.     Vision Patient Visual Report: No change from baseline     Perception     Praxis      Cognition Arousal/Alertness: Awake/alert Behavior During Therapy: WFL for tasks assessed/performed;Anxious Overall Cognitive Status: No family/caregiver present to determine baseline cognitive functioning                                 General Comments: expresses fear of falling        Exercises     Shoulder Instructions       General Comments      Pertinent Vitals/ Pain       Pain Assessment: Faces Faces Pain Scale: No hurt  Home Living Family/patient expects to be discharged to:: Private residence Living Arrangements: Spouse/significant other Available Help at Discharge: Family Type of Home: House Home Access: Stairs to enter [employer] of Steps:  15 Entrance Stairs-Rails: Left;Right;Can reach both Home Layout: Two level;Able to live on main level with bedroom/bathroom     Bathroom Shower/Tub: Occupational psychologist: Handicapped height     Home Equipment: Clinical cytogeneticist - 2 wheels          Prior Functioning/Environment Level of Independence: Independent with assistive device(s)        Comments: Uses RW occasionally   Frequency           Progress Toward Goals  OT Goals(current goals can now be found in the care plan section)     Acute Rehab OT Goals Patient Stated Goal: to go home OT Goal Formulation: With patient Time For Goal Achievement: 08/03/21 Potential to Achieve Goals: Good  Plan      Co-evaluation                  AM-PAC OT "6 Clicks" Daily Activity     Outcome Measure   Help from another person eating meals?: None Help from another person taking care of personal grooming?: None Help from another person toileting, which includes using toliet, bedpan, or urinal?: None Help from another person bathing (including washing, rinsing, drying)?: None Help from another person to put on and taking off regular upper body clothing?: None Help from another person to put on and taking off regular lower body clothing?: A Little 6 Click Score: 23    End of Session    OT Visit Diagnosis: Unsteadiness on feet (R26.81)   Activity Tolerance Patient tolerated treatment well   Patient Left in bed;with call bell/phone within reach   Nurse Communication          Time: 8657-8469 OT Time Calculation (min): 16 min  Charges: OT General Charges $OT Visit: 1 Visit OT Evaluation $OT Eval Moderate Complexity: 1 Mod  08/03/2021  RP, OTR/L  Acute Rehabilitation Services  Office:  [phone]   Metta Clines 08/03/2021, 1:58 PM

## 2021-08-03 NOTE — ED Provider Notes (Signed)
I received a call from radiology who asked if we could delay CT angio as patient is already received a large IV dye load in the setting of renal insufficiency.  Radiology Dr. Vernard Gambles would like to wait until patient's been given IV fluids.  I confirmed with neurology Dr. Theda Sers that the CT angio can be delayed. Patient given another 500 cc bolus of LR, and will recheck BMP   Ripley Fraise, MD 08/03/21 [phone]

## 2021-08-03 NOTE — ED Notes (Signed)
Per Dr. [facility], they anticipate that the pt will go home today. Pt's initial IV infiltrated. Pt is not really wanting to be stuck and we are having difficulty. Per Dr. [facility], CTA changed to MRA and can hold off on bolus for now because pt is able to eat and drink. We will get PT/OT involved to see pt and hopefully get her home this afternoon. This RN will continue to monitor.

## 2021-08-03 NOTE — Discharge Instructions (Signed)
Follow with Primary MD Jones, Thomas L, MD in 7 days   Get CBC, CMP, 2 view Chest X ray checked  by Primary MD next visit.    Activity: As tolerated with Full fall precautions use walker/cane & assistance as needed   Disposition Home    Diet: Heart Healthy  , with feeding assistance and aspiration precautions.    On your next visit with your primary care physician please Get Medicines reviewed and adjusted.   Please request your Prim.MD to go over all Hospital Tests and Procedure/Radiological results at the follow up, please get all Hospital records sent to your Prim MD by signing hospital release before you go home.   If you experience worsening of your admission symptoms, develop shortness of breath, life threatening emergency, suicidal or homicidal thoughts you must seek medical attention immediately by calling 911 or calling your MD immediately  if symptoms less severe.  You Must read complete instructions/literature along with all the possible adverse reactions/side effects for all the Medicines you take and that have been prescribed to you. Take any new Medicines after you have completely understood and accpet all the possible adverse reactions/side effects.   Do not drive, operating heavy machinery, perform activities at heights, swimming or participation in water activities or provide baby sitting services if your were admitted for syncope or siezures until you have seen by Primary MD or a Neurologist and advised to do so again.  Do not drive when taking Pain medications.    Do not take more than prescribed Pain, Sleep and Anxiety Medications  Special Instructions: If you have smoked or chewed Tobacco  in the last 2 yrs please stop smoking, stop any regular Alcohol  and or any Recreational drug use.  Wear Seat belts while driving.   Please note  You were cared for by a hospitalist during your hospital stay. If you have any questions about your discharge medications or  the care you received while you were in the hospital after you are discharged, you can call the unit and asked to speak with the hospitalist on call if the hospitalist that took care of you is not available. Once you are discharged, your primary care physician will handle any further medical issues. Please note that NO REFILLS for any discharge medications will be authorized once you are discharged, as it is imperative that you return to your primary care physician (or establish a relationship with a primary care physician if you do not have one) for your aftercare needs so that they can reassess your need for medications and monitor your lab values.  

## 2021-08-03 NOTE — Progress Notes (Signed)
  Echocardiogram 2D Echocardiogram has been performed.  Alexandra House 08/03/2021, 2:18 PM

## 2021-08-03 NOTE — ED Notes (Signed)
Pt transported to MRI 

## 2021-08-03 NOTE — Consult Note (Signed)
Neurology Consult H&P  Alexandra House MR# [id number] 08/03/2021  CC: slurred speech and word finding difficulty  History is obtained from: patient, staff and chart.  HPI: Alexandra House is a 75 y.o. female PMHx as reviewed below noted to have slurred speech and dizziness which began ~2200 on 08/01/2021 which subsided on arrival to OSH.  The patient stated that she and her husband were at the cancer center earlier in the day and her husband was told that he may have either an infection or cancer. She became distraught, sat and smoked 2 packs of cigarettes in a very short period of time then she started to have difficulty getting her words out.   The following information was taken form admission history and physical 08/02/2021:   "Patient reports that around 8 PM- 10pm last evening she had an episode of garbled speech and difficulty word finding especially when she was writing.  States event lasted for about an hour and then she just went to bed because she felt fatigue.  She woke up this morning with symptoms resolved and presented to the ED at the urging of her family.  She she notes some transient dizziness when moving in her bed last night.  She denies any chest pain or palpitations.  No headache or blurred vision.  No focal extremity weakness.  Denies any history of previous CVA. Uses aspirin sparingly.  Reports that yesterday she was more stressed than usual since her husband was told that he may have new diagnosis of cancer.   ED Course: She was afebrile and was not noted to have significant blood pressure difference between her left and right upper extremity.  She was normotensive to her right arm but had blood pressure reading and to systolic of 82N in her left arm.  She denies any history of numbness, tingling or weakness of her left arm. CBC shows no leukocytosis, hemoglobin of 11.7.  Sodium of 129, potassium of 4.2, creatinine of 1.11, BG of 87. CT head and MRI brain was negative.   CTA chest, abdomen and pelvis showed no thoracic aortic dissection but had mild multifocal irregularity of the proximal left subclavian artery.   EKG on my reveals show some slight V4 and V5 ST depression but this is similar to prior.   Neurology was consulted by Dr. Doren Custard at [place] and recommendation was made for ED to ED transfer and hospitalist admission."   LKW: unclear tNK given: No OSW IR Thrombectomy No Modified Rankin Scale: 0-Completely asymptomatic and back to baseline post- stroke NIHSS: 0  ROS: A complete ROS was performed and is negative except as noted in the HPI.   Past Medical History:  Diagnosis Date   Acute on chronic respiratory failure (HCC)    Anxiety    Caloric malnutrition (HCC)    Carotid stenosis    Coronary artery disease    Fibromyalgia    HLD (hyperlipidemia)    Hx of cardiovascular stress test    a. Lex MV 11/13: + significant ECG changes and CHB with infusion; EF 81%, no ischemia,    Hx of echocardiogram    a. Echo 12/12: EF 05-39%, grade 2 diastolic dysfunction, mild MR.; b. Echo 11/13:  mild LVH, EF 60-65%   Hypertension    Neuropathy    Osteoporosis    Syncope 08/2009   a. ETT-Echo 1/11:  poor ex tol, submax exercise, no WMA or ECG changes c/w ischemia;       Family History  Problem  Relation Age of Onset   CAD Mother 69       Status post PCI   Breast cancer Mother    Lung cancer Father    Colon cancer Neg Hx     Social History:  reports that she has been smoking cigarettes. She has been smoking an average of .5 packs per day. She uses smokeless tobacco. She reports current alcohol use. She reports that she does not use drugs.   Prior to Admission medications   Medication Sig Start Date End Date Taking? Authorizing Provider  alendronate (FOSAMAX) 70 MG tablet  02/27/18   [provider]  aspirin EC 81 MG tablet Take 81 mg by mouth daily.    [provider]  benzonatate (TESSALON) 100 MG capsule Take 1 capsule  (100 mg total) by mouth 2 (two) times daily as needed for cough. 06/08/21   Janith Lima, MD  BREO ELLIPTA 100-25 MCG/INH AEPB Inhale 1 puff into the lungs daily. 12/06/20   Janith Lima, MD  Cholecalciferol 125 MCG (5000 UT) capsule Take 1 capsule (5,000 Units total) by mouth daily. 12/06/20   Janith Lima, MD  EPINEPHrine (EPIPEN JR) 0.15 MG/0.3ML injection Inject 15 mg into the muscle as needed. Patient not taking: Reported on 04/27/2021 05/28/17   [provider]  ergocalciferol (VITAMIN D2) 50000 units capsule Take 50,000 Units by mouth once a week.    [provider]  guaiFENesin (MUCINEX) 600 MG 12 hr tablet Take by mouth as needed.    [provider]  LORazepam (ATIVAN) 1 MG tablet TAKE 1 TABLET(1 MG) BY MOUTH EVERY 8 HOURS AS NEEDED FOR ANXIETY 07/21/21   Janith Lima, MD  Multiple Vitamins-Minerals (MULTIVITAMIN GUMMIES ADULT PO) Take 2 tablets by mouth daily.    [provider]  rosuvastatin (CRESTOR) 10 MG tablet Take 1 tablet (10 mg total) by mouth at bedtime. 12/06/20   Janith Lima, MD    Exam: Current vital signs: BP (!) 88/55   Pulse (!) 58   Temp 98.5 F (36.9 C) (Oral)   Resp (!) 22   Ht 4\' 11"  (1.499 m)   Wt 38.1 kg   LMP  (LMP Unknown)   SpO2 99%   BMI 16.97 kg/m   Physical Exam  Constitutional: Appears well-developed and well-nourished.  Psych: Affect appropriate to situation Eyes: No scleral injection HENT: No OP obstruction. Head: Normocephalic.  Cardiovascular: Normal rate and regular rhythm.  Respiratory: Effort normal, symmetric excursions bilaterally, no audible wheezing. GI: Soft.  No distension. There is no tenderness.  Skin: WDI  Neuro: Mental Status: Patient is awake, alert, oriented to person, place, month, year, and situation. Patient is able to give a clear and coherent history. Speech fluent, intact comprehension and repetition. No signs of aphasia or neglect. Visual Fields are full. Pupils are  equal, round, and reactive to light. EOMI without ptosis or diploplia.  Facial sensation is symmetric to temperature Facial movement is symmetric.  Hearing is intact to voice. Uvula midline and palate elevates symmetrically. Shoulder shrug is symmetric. Tongue is midline without atrophy or fasciculations.  Tone is normal. Bulk is normal. 5/5 strength was present in all four extremities. Sensation is symmetric to light touch and temperature in the arms and legs. Deep Tendon Reflexes: 2+ and symmetric in the biceps and patellae. Toes are downgoing bilaterally. FNF and HKS are intact bilaterally. Gait - Deferred  I have reviewed labs in epic and the pertinent results are: Na 129  A1c 5.2  I have reviewed the images obtained: NCT head showed no acute ischemic changes, hemorrhage or mass. CTA neck showed no hemodynamically significant stenosis in the arteries of the neck. MRI brain did not show acute ischemic changes, hemorrhage or mass lesion. MRI brain did not show acute intracranial process.   Assessment: Alexandra House is a 75 y.o. female PMHx as noted above who currently smokes 1 pack of cigarettes per day with transient episode of aphasia. She thinks this episode may have been provoked by the combination of the news that her husband may have cancer and that she smoked 2 packs in a very short period of time. She describes the episode as speaking in [place] which lasted about 1 hour and her husband could not understand her. She mentioned her husband took her blood pressure during the event which was normal. After the event she was very sleepy. Differential diagnosis includes TIA, seizure and less likely acute stress reaction.   Plan: - Recommend vascular imaging head and neck MRA/CTA. - Recommend TTE. - Routine EEG - Ordered. - Recommend Statin if LDL > 70 - Continue aspirin 81mg  daily. - Continue clopidogrel 75mg  daily for 3 weeks. - SBP goal <140. - Telemetry monitoring for  arrhythmia. - Recommend bedside Swallow screen. - Recommend Stroke education. - Recommend PT/OT/SLP consult. - Recommended she stop smoking and though she can go 3 or so days without smoking, she feels that she has been smoking so long that quitting may not make a difference - Her thought process may be due to her husband's medical situation as she stated that she cannot go on without him.  Electronically signed by:  Lynnae Sandhoff, MD Page: [phone] 08/03/2021, 2:21 AM

## 2021-08-03 NOTE — Discharge Summary (Signed)
Physician Discharge Summary  Alexandra House WFU:[id number] DOB: 01-14-1946 DOA: 08/02/2021  PCP: Janith Lima, MD  Admit date: 08/02/2021 Discharge date: 08/03/2021  Admitted From: Home Disposition: Home   Recommendations for Outpatient Follow-up:  Follow up with PCP in 1-2 weeks Please obtain BMP/CBC in one week Please follow up with vascular surgery regarding subclavian stenosis  Home Health:NO  Discharge Condition:Stable CODE STATUS:FULL, Diet recommendation: Heart Healthy   Brief/Interim Summary:  TIA -CT head, MRI brain, with no acute abnormalities, MRA head with no acute findings, CTA neck with no significant stenosis of arteries of the neck, 2D echo with EF 20%, no embolic source. -LDL is elevated, she was resumed on Crestor, started on Zetia -She was monitored on telemetry, it was significant for multiple PACs, but there was no evidence of A. fib, or arrhythmia. -Recommendation is to discharge on aspirin and Plavix for 3 weeks, then Plavix alone. -She was seen by PT, OT, no recommendation for outpatient work-up  Subclavian stenosis/peripheral vascular disease -Patient was noted to have difference in blood pressure measurement in the right and left side, CTA was negative for aortic dissection, but significant for subclavian stenosis. -She is on aspirin, Plavix, statin and Zetia, she did not want to wait for vascular evaluation during hospital stay, so I have discussed with Dr. Luan Pulling, who will arrange outpatient follow-up for her..  Hyponatremia - Sodium of 129 admit.  Continue gentle IV fluid hydration follow with repeat labs in the morning  Combined diastolic and systolic CHF -Compensated   Hyperlipidemia LDL elevated at 168, she is resumed on Crestor and started on Zetia.   Discharge Diagnoses:  Active Problems:   AKI (acute kidney injury) ([place])   Systolic and diastolic CHF, chronic (HCC)   Aphasia   Hyponatremia   Hyperlipidemia    Discharge  Instructions  Discharge Instructions     Diet - low sodium heart healthy   Complete by: As directed    Discharge instructions   Complete by: As directed    Follow with Primary MD Janith Lima, MD in 7 days   Get CBC, CMP,  checked  by Primary MD next visit.    Activity: As tolerated with Full fall precautions use walker/cane & assistance as needed   Disposition Home    Diet: Heart Healthy  , with feeding assistance and aspiration precautions.  For Heart failure patients - Check your Weight same time everyday, if you gain over 2 pounds, or you develop in leg swelling, experience more shortness of breath or chest pain, call your Primary MD immediately. Follow Cardiac Low Salt Diet and 1.5 lit/day fluid restriction.   On your next visit with your primary care physician please Get Medicines reviewed and adjusted.   Please request your Prim.MD to go over all Hospital Tests and Procedure/Radiological results at the follow up, please get all Hospital records sent to your Prim MD by signing hospital release before you go home.   If you experience worsening of your admission symptoms, develop shortness of breath, life threatening emergency, suicidal or homicidal thoughts you must seek medical attention immediately by calling 911 or calling your MD immediately  if symptoms less severe.  You Must read complete instructions/literature along with all the possible adverse reactions/side effects for all the Medicines you take and that have been prescribed to you. Take any new Medicines after you have completely understood and accpet all the possible adverse reactions/side effects.   Do not drive, operating heavy machinery, perform activities  at heights, swimming or participation in water activities or provide baby sitting services if your were admitted for syncope or siezures until you have seen by Primary MD or a Neurologist and advised to do so again.  Do not drive when taking Pain  medications.    Do not take more than prescribed Pain, Sleep and Anxiety Medications  Special Instructions: If you have smoked or chewed Tobacco  in the last 2 yrs please stop smoking, stop any regular Alcohol  and or any Recreational drug use.  Wear Seat belts while driving.   Please note  You were cared for by a hospitalist during your hospital stay. If you have any questions about your discharge medications or the care you received while you were in the hospital after you are discharged, you can call the unit and asked to speak with the hospitalist on call if the hospitalist that took care of you is not available. Once you are discharged, your primary care physician will handle any further medical issues. Please note that NO REFILLS for any discharge medications will be authorized once you are discharged, as it is imperative that you return to your primary care physician (or establish a relationship with a primary care physician if you do not have one) for your aftercare needs so that they can reassess your need for medications and monitor your lab values.   Increase activity slowly   Complete by: As directed       Allergies as of 08/03/2021       Reactions   Bee Venom Anaphylaxis, Hives   Lipitor [atorvastatin] Other (See Comments)   alopecia        Medication List     TAKE these medications    alendronate 70 MG tablet Commonly known as: FOSAMAX   aspirin EC 81 MG tablet Take 1 tablet (81 mg total) by mouth daily.   benzonatate 100 MG capsule Commonly known as: TESSALON Take 1 capsule (100 mg total) by mouth 2 (two) times daily as needed for cough.   Breo Ellipta 100-25 MCG/ACT Aepb Generic drug: fluticasone furoate-vilanterol Inhale 1 puff into the lungs daily.   Cholecalciferol 125 MCG (5000 UT) capsule Take 1 capsule (5,000 Units total) by mouth daily.   clopidogrel 75 MG tablet Commonly known as: PLAVIX Take 1 tablet (75 mg total) by mouth daily. Start  taking on: August 04, 2021   EPINEPHrine 0.15 MG/0.3ML injection Commonly known as: EPIPEN JR Inject 15 mg into the muscle as needed.   ergocalciferol 1.25 MG (50000 UT) capsule Commonly known as: VITAMIN D2 Take 50,000 Units by mouth once a week.   ezetimibe 10 MG tablet Commonly known as: ZETIA Take 1 tablet (10 mg total) by mouth daily. Start taking on: August 04, 2021   guaiFENesin 600 MG 12 hr tablet Commonly known as: MUCINEX Take by mouth as needed.   LORazepam 1 MG tablet Commonly known as: ATIVAN TAKE 1 TABLET(1 MG) BY MOUTH EVERY 8 HOURS AS NEEDED FOR ANXIETY   MULTIVITAMIN GUMMIES ADULT PO Take 2 tablets by mouth daily.   rosuvastatin 10 MG tablet Commonly known as: CRESTOR Take 1 tablet (10 mg total) by mouth at bedtime.        Follow-up Information     Cherre Robins, MD Follow up.   Specialties: Vascular Surgery, Interventional Cardiology Why: you will be called with a follow-up appointment Contact information: [address] [place] [address] [phone]  Allergies  Allergen Reactions   Bee Venom Anaphylaxis and Hives   Lipitor [Atorvastatin] Other (See Comments)    alopecia    Consultations: Neurology   Procedures/Studies: CT HEAD WO CONTRAST  Result Date: 08/02/2021 CLINICAL DATA:  Dizziness and slurred speech since last evening. EXAM: CT HEAD WITHOUT CONTRAST TECHNIQUE: Contiguous axial images were obtained from the base of the skull through the vertex without intravenous contrast. COMPARISON:  04/30/2017 FINDINGS: Brain: Stable age related cerebral atrophy, ventriculomegaly and periventricular white matter disease. No extra-axial fluid collections are identified. No CT findings for acute hemispheric infarction or intracranial hemorrhage. No mass lesions. The brainstem and cerebellum are normal. Vascular: Stable vascular calcifications. No aneurysm or hyperdense vessels. Skull: No skull fracture or bone lesions.  Sinuses/Orbits: The paranasal sinuses and mastoid air cells are clear. The globes are intact. Other: No scalp lesions or scalp hematoma. IMPRESSION: 1. Stable age related cerebral atrophy, ventriculomegaly and periventricular white matter disease. 2. No acute intracranial findings or mass lesions. Electronically Signed   By: Marijo Sanes M.D.   On: 08/02/2021 13:12   CT Angio Neck W and/or Wo Contrast  Addendum Date: 08/03/2021   ADDENDUM REPORT: 08/03/2021 15:30 ADDENDUM: Initial dictation by Dr. Lennon Alstrom. Addendum by Dr. Armandina Gemma. I was contacted by Dr. Erlinda Hong of neurology who informed me that the patient has a significant blood pressure differential between the left and right upper extremities with blood pressure in the left upper extremity significantly lower than in the right. There are sites of up to moderate stenosis within the proximal left subclavian artery prior to the left vertebral artery origin (series 5, image 53). Additionally, there are sites of up to moderate/severe stenosis within the proximal left subclavian artery distal to the left vertebral artery origin (for instance as seen on series 5, image 87). These findings were discussed with Dr. Erlinda Hong of neurology by telephone at approximately 3:20 p.m. on 08/03/2021. Electronically Signed   By: Kellie Simmering D.O.   On: 08/03/2021 15:30   Result Date: 08/03/2021 CLINICAL DATA:  Stroke/TIA, assess extracranial arteries EXAM: CT ANGIOGRAPHY NECK TECHNIQUE: Multidetector CT imaging of the neck was performed using the standard protocol during bolus administration of intravenous contrast. Multiplanar CT image reconstructions and MIPs were obtained to evaluate the vascular anatomy. Carotid stenosis measurements (when applicable) are obtained utilizing NASCET criteria, using the distal internal carotid diameter as the denominator. CONTRAST:  79mL OMNIPAQUE IOHEXOL 350 MG/ML SOLN COMPARISON:  None. FINDINGS: Aortic arch: Standard branching. Mild multifocal  irregularity of the proximal left subclavian artery, which remains patent. Please see same-day CTA chest for additional thoracic vasculature findings. Right carotid system: No evidence of dissection, stenosis (50% or greater) or occlusion. Left carotid system: No evidence of dissection, stenosis (50% or greater) or occlusion. Vertebral arteries: No evidence of dissection, stenosis (50% or greater) or occlusion. Skeleton: No acute osseous abnormality. Other neck: Negative. Upper chest: For findings in the thorax, please see same day CTA chest. IMPRESSION: 1. No hemodynamically significant stenosis in the arteries of the neck. 2. For findings in the thorax, please see same-day CTA chest. Electronically Signed: By: Merilyn Baba M.D. On: 08/02/2021 21:39   MR ANGIO HEAD WO CONTRAST  Result Date: 08/03/2021 CLINICAL DATA:  TIA EXAM: MRA HEAD WITHOUT CONTRAST TECHNIQUE: Angiographic images of the Circle of Willis were acquired using MRA technique without intravenous contrast. COMPARISON:  MRI head 08/02/2021 FINDINGS: Anterior circulation: Internal carotid artery widely patent bilaterally. Anterior and middle cerebral arteries patent bilaterally. No stenosis or  occlusion. Posterior circulation: Normal posterior circulation. No stenosis or occlusion. Anatomic variants: No aneurysm identified Other: None IMPRESSION: Negative MRA head Electronically Signed   By: Franchot Gallo M.D.   On: 08/03/2021 10:02   MR BRAIN WO CONTRAST  Result Date: 08/02/2021 CLINICAL DATA:  TIA.  Slurred speech. EXAM: MRI HEAD WITHOUT CONTRAST TECHNIQUE: Multiplanar, multiecho pulse sequences of the brain and surrounding structures were obtained without intravenous contrast. COMPARISON:  CT head 08/02/2021 FINDINGS: Brain: Negative for acute infarct. Mild to moderate atrophy. Mild white matter changes with scattered small deep white matter hyperintensities bilaterally. Negative for hemorrhage or mass. Vascular: Normal arterial flow  voids. Skull and upper cervical spine: Negative Sinuses/Orbits: Negative Other: None IMPRESSION: No acute infarct Atrophy and mild chronic microvascular ischemia. Electronically Signed   By: Franchot Gallo M.D.   On: 08/02/2021 19:27   EEG adult  Result Date: 08/03/2021 Lora Havens, MD     08/03/2021 12:15 PM Patient Name: Alexandra House MRN: [id number] Epilepsy Attending: Lora Havens Referring Physician/Provider: Dr Lynnae Sandhoff Date: 08/03/2021 Duration: 22.35 mins Patient history: .  75 year old female with transient episode of aphasia.  EEG evaluate for seizure. Level of alertness: Awake, AEDs during EEG study: None Technical aspects: This EEG study was done with scalp electrodes positioned according to the 10-20 International system of electrode placement. Electrical activity was acquired at a sampling rate of 500Hz  and reviewed with a high frequency filter of 70Hz  and a low frequency filter of 1Hz . EEG data were recorded continuously and digitally stored. Description: The posterior dominant rhythm consists of 8-9 Hz activity of moderate voltage (25-35 uV) seen predominantly in posterior head regions, symmetric and reactive to eye opening and eye closing. Intermittent left> right temporal region 2 to 3 Hz delta slowing was also noted. Hyperventilation and photic stimulation were not performed.   Of note, study was technically difficult due to significant movement and electrode artifact. ABNORMALITY -Intermittent slow, left.right temporal region IMPRESSION: This technically difficult study is suggestive of cortical dysfunction in left>right temporal region which is nonspecific in etiology.  No seizures or epileptiform discharges were seen throughout the recording. Lora Havens   ECHOCARDIOGRAM COMPLETE  Result Date: 08/03/2021    ECHOCARDIOGRAM REPORT   Patient Name:   JIAH BARI Date of Exam: 08/03/2021 Medical Rec #:  [id number]       Height:       59.0 in Accession #:     [id number]      Weight:       84.0 lb Date of Birth:  1946-02-18        BSA:          1.276 m Patient Age:    40 years        BP:           148/90 mmHg Patient Gender: F               HR:           63 bpm. Exam Location:  Inpatient Procedure: 2D Echo, Cardiac Doppler and Color Doppler Indications:    TIA  History:        Patient has prior history of Echocardiogram examinations, most                 recent 06/24/2018. CHF, Carotid Disease, Signs/Symptoms:Syncope                 and Dizziness/Lightheadedness; Risk Factors:Dyslipidemia and  Hypertension.  Sonographer:    Dustin Flock RDCS Referring Phys: [id number] [place]  1. Left ventricular ejection fraction, by estimation, is 60 to 65%. The left ventricle has normal function. The left ventricle has no regional wall motion abnormalities. Left ventricular diastolic parameters are consistent with Grade I diastolic dysfunction (impaired relaxation).  2. Right ventricular systolic function is normal. The right ventricular size is normal. There is normal pulmonary artery systolic pressure.  3. Left atrial size was mild to moderately dilated.  4. The mitral valve is normal in structure. Mild to moderate mitral valve regurgitation. No evidence of mitral stenosis.  5. The aortic valve is normal in structure. There is mild calcification of the aortic valve. Aortic valve regurgitation is trivial. No aortic stenosis is present.  6. The inferior vena cava is normal in size with greater than 50% respiratory variability, suggesting right atrial pressure of 3 mmHg. FINDINGS  Left Ventricle: Left ventricular ejection fraction, by estimation, is 60 to 65%. The left ventricle has normal function. The left ventricle has no regional wall motion abnormalities. The left ventricular internal cavity size was normal in size. There is  no left ventricular hypertrophy. Left ventricular diastolic parameters are consistent with Grade I diastolic dysfunction  (impaired relaxation). Right Ventricle: The right ventricular size is normal. No increase in right ventricular wall thickness. Right ventricular systolic function is normal. There is normal pulmonary artery systolic pressure. The tricuspid regurgitant velocity is 2.42 m/s, and  with an assumed right atrial pressure of 3 mmHg, the estimated right ventricular systolic pressure is 10.9 mmHg. Left Atrium: Left atrial size was mild to moderately dilated. Right Atrium: Right atrial size was normal in size. Pericardium: There is no evidence of pericardial effusion. Mitral Valve: The mitral valve is normal in structure. Mild to moderate mitral valve regurgitation. No evidence of mitral valve stenosis. Tricuspid Valve: The tricuspid valve is normal in structure. Tricuspid valve regurgitation is trivial. No evidence of tricuspid stenosis. Aortic Valve: The aortic valve is normal in structure. There is mild calcification of the aortic valve. Aortic valve regurgitation is trivial. No aortic stenosis is present. Pulmonic Valve: The pulmonic valve was normal in structure. Pulmonic valve regurgitation is trivial. No evidence of pulmonic stenosis. Aorta: The aortic root is normal in size and structure. Venous: The inferior vena cava is normal in size with greater than 50% respiratory variability, suggesting right atrial pressure of 3 mmHg. IAS/Shunts: No atrial level shunt detected by color flow Doppler.  LEFT VENTRICLE PLAX 2D LVIDd:         3.90 cm   Diastology LVIDs:         2.60 cm   LV e' medial:    5.87 cm/s LV PW:         1.00 cm   LV E/e' medial:  15.5 LV IVS:        1.10 cm   LV e' lateral:   6.64 cm/s LVOT diam:     2.00 cm   LV E/e' lateral: 13.7 LV SV:         62 LV SV Index:   48 LVOT Area:     3.14 cm  RIGHT VENTRICLE RV Basal diam:  1.90 cm RV S prime:     11.30 cm/s LEFT ATRIUM             Index        RIGHT ATRIUM          Index LA diam:  3.70 cm 2.90 cm/m   RA Area:     7.49 cm LA Vol (A2C):   34.8 ml  27.28 ml/m  RA Volume:   13.50 ml 10.58 ml/m LA Vol (A4C):   21.6 ml 16.93 ml/m LA Biplane Vol: 30.0 ml 23.52 ml/m  AORTIC VALVE LVOT Vmax:   77.00 cm/s LVOT Vmean:  54.900 cm/s LVOT VTI:    0.196 m  AORTA Ao Root diam: 2.30 cm MITRAL VALVE                TRICUSPID VALVE MV Area (PHT): 2.84 cm     TR Peak grad:   23.4 mmHg MV Decel Time: 267 msec     TR Vmax:        242.00 cm/s MV E velocity: 90.80 cm/s MV A velocity: 109.00 cm/s  SHUNTS MV E/A ratio:  0.83         Systemic VTI:  0.20 m                             Systemic Diam: 2.00 cm Glori Bickers MD Electronically signed by Glori Bickers MD Signature Date/Time: 08/03/2021/4:12:38 PM    Final    CT Angio Chest/Abd/Pel for Dissection W and/or Wo Contrast  Result Date: 08/02/2021 CLINICAL DATA:  BP difference between both arms, evaluate for aortic dissection EXAM: CT ANGIOGRAPHY CHEST, ABDOMEN AND PELVIS TECHNIQUE: Non-contrast CT of the chest was initially obtained. Multidetector CT imaging through the chest, abdomen and pelvis was performed using the standard protocol during bolus administration of intravenous contrast. Multiplanar reconstructed images and MIPs were obtained and reviewed to evaluate the vascular anatomy. CONTRAST:  68mL OMNIPAQUE IOHEXOL 350 MG/ML SOLN COMPARISON:  Chest radiographs dated 12/06/2020. Low-dose lung cancer screening CT chest dated 07/06/2019. FINDINGS: CTA CHEST FINDINGS Cardiovascular: On unenhanced CT, there is no evidence of intramural hematoma. Following contrast administration, there is preferential opacification of the thoracic aorta. No evidence of thoracic aortic aneurysm or dissection. Although not tailored for evaluation of the pulmonary arteries, there is no evidence of pulmonary embolism to the segmental level. The heart is normal in size.  No pericardial effusion. Mild coronary atherosclerosis of the LAD. Mediastinum/Nodes: No suspicious mediastinal lymphadenopathy. Visualized thyroid is unremarkable.  Lungs/Pleura: Moderate centrilobular and paraseptal emphysematous changes, upper lung predominant. Biapical pleural-parenchymal scarring. No suspicious pulmonary nodules. No focal consolidation. No pleural effusion or pneumothorax. Musculoskeletal: No focal osseous lesions. Review of the MIP images confirms the above findings. CTA ABDOMEN AND PELVIS FINDINGS VASCULAR Aorta: No evidence abdominal aortic aneurysm or dissection. Patent. Atherosclerotic calcifications. Celiac: Patent. SMA: Patent. Renals: Patent bilaterally IMA: Patent. Inflow: Patent.  Atherosclerotic calcifications bilaterally. Veins: Grossly unremarkable. Review of the MIP images confirms the above findings. NON-VASCULAR Hepatobiliary: Liver is within normal limits. Gallbladder is unremarkable. No intrahepatic or extrahepatic ductal dilatation. Pancreas: Within normal limits. Spleen: Within normal limits. Adrenals/Urinary Tract: Adrenal glands are within normal limits. Kidneys are within normal limits.  No hydronephrosis. Bladder is within normal limits. Stomach/Bowel: Stomach is notable for a small hiatal hernia. No evidence of bowel obstruction. Normal appendix (series 8/image 217). Sigmoid diverticulosis, without evidence of diverticulitis. Lymphatic: No suspicious abdominopelvic lymphadenopathy. Reproductive: Uterus is within normal limits. Bilateral ovaries are within normal limits. Other: No abdominopelvic ascites. Musculoskeletal: No focal osseous lesions. Review of the MIP images confirms the above findings. IMPRESSION: No evidence of thoracoabdominal aortic aneurysm or dissection. No acute cardiopulmonary abnormality. Sigmoid diverticulosis, without evidence of diverticulitis. Aortic  Atherosclerosis (ICD10-I70.0) and Emphysema (ICD10-J43.9). Electronically Signed   By: Julian Hy M.D.   On: 08/02/2021 21:41      Subjective: She denies any complaints, no new focal deficits, no speech problems, she is eager to go home.  Discharge  Exam: Vitals:   08/03/21 1507 08/03/21 1600  BP: (!) 99/57 113/61  Pulse: 63 69  Resp: 18 18  Temp:    SpO2: 98% 98%   Vitals:   08/03/21 1230 08/03/21 1506 08/03/21 1507 08/03/21 1600  BP: (!) 148/90 (!) 152/64 (!) 99/57 113/61  Pulse: 66 64 63 69  Resp: 12  18 18   Temp:      TempSrc:      SpO2: 100%  98% 98%  Weight:      Height:        General: Pt is alert, awake, not in acute distress Cardiovascular: RRR, S1/S2 +, no rubs, no gallops Respiratory: CTA bilaterally, no wheezing, no rhonchi Abdominal: Soft, NT, ND, bowel sounds + Extremities: no edema, no cyanosis    The results of significant diagnostics from this hospitalization (including imaging, microbiology, ancillary and laboratory) are listed below for reference.     Microbiology: Recent Results (from the past 240 hour(s))  Resp Panel by RT-PCR (Flu A&B, Covid) Nasopharyngeal Swab     Status: None   Collection Time: 08/02/21  5:32 PM   Specimen: Nasopharyngeal Swab; Nasopharyngeal(NP) swabs in vial transport medium  Result Value Ref Range Status   SARS Coronavirus 2 by RT PCR NEGATIVE NEGATIVE Final    Comment: (NOTE) SARS-CoV-2 target nucleic acids are NOT DETECTED.  The SARS-CoV-2 RNA is generally detectable in upper respiratory specimens during the acute phase of infection. The lowest concentration of SARS-CoV-2 viral copies this assay can detect is 138 copies/mL. A negative result does not preclude SARS-Cov-2 infection and should not be used as the sole basis for treatment or other patient management decisions. A negative result may occur with  improper specimen collection/handling, submission of specimen other than nasopharyngeal swab, presence of viral mutation(s) within the areas targeted by this assay, and inadequate number of viral copies(<138 copies/mL). A negative result must be combined with clinical observations, patient history, and epidemiological information. The expected result is  Negative.  Fact Sheet for Patients:  EntrepreneurPulse.com.au  Fact Sheet for Healthcare Providers:  IncredibleEmployment.be  This test is no t yet approved or cleared by the [place] FDA and  has been authorized for detection and/or diagnosis of SARS-CoV-2 by FDA under an Emergency Use Authorization (EUA). This EUA will remain  in effect (meaning this test can be used) for the duration of the COVID-19 declaration under Section 564(b)(1) of the Act, 21 U.S.C.section 360bbb-3(b)(1), unless the authorization is terminated  or revoked sooner.       Influenza A by PCR NEGATIVE NEGATIVE Final   Influenza B by PCR NEGATIVE NEGATIVE Final    Comment: (NOTE) The Xpert Xpress SARS-CoV-2/FLU/RSV plus assay is intended as an aid in the diagnosis of influenza from Nasopharyngeal swab specimens and should not be used as a sole basis for treatment. Nasal washings and aspirates are unacceptable for Xpert Xpress SARS-CoV-2/FLU/RSV testing.  Fact Sheet for Patients: EntrepreneurPulse.com.au  Fact Sheet for Healthcare Providers: IncredibleEmployment.be  This test is not yet approved or cleared by the [place] FDA and has been authorized for detection and/or diagnosis of SARS-CoV-2 by FDA under an Emergency Use Authorization (EUA). This EUA will remain in effect (meaning this test can be used) for the  duration of the COVID-19 declaration under Section 564(b)(1) of the Act, 21 U.S.C. section 360bbb-3(b)(1), unless the authorization is terminated or revoked.  Performed at [facility], [address], [place], [place] [address]      Labs: BNP (last 3 results) No results for input(s): BNP in the last 8760 hours. Basic Metabolic Panel: Recent Labs  Lab 08/02/21 1203  NA 129*  K 4.2  CL 96*  CO2 24  GLUCOSE 87  BUN 18  CREATININE 1.11*  CALCIUM 9.3   Liver Function Tests: Recent  Labs  Lab 08/02/21 1203  AST 19  ALT 8  ALKPHOS 67  BILITOT 0.4  PROT 7.1  ALBUMIN 3.9   No results for input(s): LIPASE, AMYLASE in the last 168 hours. No results for input(s): AMMONIA in the last 168 hours. CBC: Recent Labs  Lab 08/02/21 1203  WBC 9.4  NEUTROABS 5.1  HGB 11.7*  HCT 35.3*  MCV 87.6  PLT 315   Cardiac Enzymes: No results for input(s): CKTOTAL, CKMB, CKMBINDEX, TROPONINI in the last 168 hours. BNP: Invalid input(s): POCBNP CBG: Recent Labs  Lab 08/02/21 1200  GLUCAP 98   D-Dimer No results for input(s): DDIMER in the last 72 hours. Hgb A1c Recent Labs    08/03/21 0354  HGBA1C 5.2   Lipid Profile Recent Labs    08/03/21 0354  CHOL 239*  HDL 56  LDLCALC 168*  TRIG 75  CHOLHDL 4.3   Thyroid function studies No results for input(s): TSH, T4TOTAL, T3FREE, THYROIDAB in the last 72 hours.  Invalid input(s): FREET3 Anemia work up No results for input(s): VITAMINB12, FOLATE, FERRITIN, TIBC, IRON, RETICCTPCT in the last 72 hours. Urinalysis    Component Value Date/Time   COLORURINE YELLOW 08/02/2021 [place] 08/02/2021 1734   LABSPEC 1.013 08/02/2021 1734   PHURINE 6.5 08/02/2021 1734   GLUCOSEU NEGATIVE 08/02/2021 1734   HGBUR NEGATIVE 08/02/2021 1734   BILIRUBINUR NEGATIVE 08/02/2021 [place] 08/02/2021 [place] 08/02/2021 1734   NITRITE NEGATIVE 08/02/2021 1734   LEUKOCYTESUR NEGATIVE 08/02/2021 1734   Sepsis Labs Invalid input(s): PROCALCITONIN,  WBC,  LACTICIDVEN Microbiology Recent Results (from the past 240 hour(s))  Resp Panel by RT-PCR (Flu A&B, Covid) Nasopharyngeal Swab     Status: None   Collection Time: 08/02/21  5:32 PM   Specimen: Nasopharyngeal Swab; Nasopharyngeal(NP) swabs in vial transport medium  Result Value Ref Range Status   SARS Coronavirus 2 by RT PCR NEGATIVE NEGATIVE Final    Comment: (NOTE) SARS-CoV-2 target nucleic acids are NOT DETECTED.  The  SARS-CoV-2 RNA is generally detectable in upper respiratory specimens during the acute phase of infection. The lowest concentration of SARS-CoV-2 viral copies this assay can detect is 138 copies/mL. A negative result does not preclude SARS-Cov-2 infection and should not be used as the sole basis for treatment or other patient management decisions. A negative result may occur with  improper specimen collection/handling, submission of specimen other than nasopharyngeal swab, presence of viral mutation(s) within the areas targeted by this assay, and inadequate number of viral copies(<138 copies/mL). A negative result must be combined with clinical observations, patient history, and epidemiological information. The expected result is Negative.  Fact Sheet for Patients:  EntrepreneurPulse.com.au  Fact Sheet for Healthcare Providers:  IncredibleEmployment.be  This test is no t yet approved or cleared by the [place] FDA and  has been authorized for detection and/or diagnosis of SARS-CoV-2 by FDA under an Emergency  Use Authorization (EUA). This EUA will remain  in effect (meaning this test can be used) for the duration of the COVID-19 declaration under Section 564(b)(1) of the Act, 21 U.S.C.section 360bbb-3(b)(1), unless the authorization is terminated  or revoked sooner.       Influenza A by PCR NEGATIVE NEGATIVE Final   Influenza B by PCR NEGATIVE NEGATIVE Final    Comment: (NOTE) The Xpert Xpress SARS-CoV-2/FLU/RSV plus assay is intended as an aid in the diagnosis of influenza from Nasopharyngeal swab specimens and should not be used as a sole basis for treatment. Nasal washings and aspirates are unacceptable for Xpert Xpress SARS-CoV-2/FLU/RSV testing.  Fact Sheet for Patients: EntrepreneurPulse.com.au  Fact Sheet for Healthcare Providers: IncredibleEmployment.be  This test is not yet approved or  cleared by the [place] FDA and has been authorized for detection and/or diagnosis of SARS-CoV-2 by FDA under an Emergency Use Authorization (EUA). This EUA will remain in effect (meaning this test can be used) for the duration of the COVID-19 declaration under Section 564(b)(1) of the Act, 21 U.S.C. section 360bbb-3(b)(1), unless the authorization is terminated or revoked.  Performed at [facility], [address] [place], [place] [address]      Time coordinating discharge: Over 30 minutes  SIGNED:   Phillips Climes, MD  [facility] 08/03/2021, 5:12 PM Pager   If 7PM-7AM, please contact night-coverage [email or web address] Password [email or web address]

## 2021-08-03 NOTE — Progress Notes (Signed)
EEG complete - results pending 

## 2021-08-04 ENCOUNTER — Other Ambulatory Visit: Payer: Self-pay | Admitting: Student

## 2021-08-04 ENCOUNTER — Encounter: Payer: Self-pay | Admitting: Radiology

## 2021-08-04 DIAGNOSIS — I679 Cerebrovascular disease, unspecified: Secondary | ICD-10-CM

## 2021-08-04 DIAGNOSIS — G459 Transient cerebral ischemic attack, unspecified: Secondary | ICD-10-CM

## 2021-08-04 NOTE — Progress Notes (Signed)
Ordered 30 day Event Monitor for further evaluation of TIA per request of Neurology.   Darreld Mclean, PA-C 08/04/2021 1:55 PM

## 2021-08-04 NOTE — Progress Notes (Signed)
Enrolled patient for a 30 day [employer] Event Monitor to be mailed to patients home  

## 2021-08-21 ENCOUNTER — Ambulatory Visit: Payer: Medicare HMO | Admitting: Internal Medicine

## 2021-08-25 ENCOUNTER — Encounter: Payer: Self-pay | Admitting: Cardiology

## 2021-08-25 ENCOUNTER — Ambulatory Visit (INDEPENDENT_AMBULATORY_CARE_PROVIDER_SITE_OTHER): Payer: Medicare HMO

## 2021-08-25 DIAGNOSIS — G459 Transient cerebral ischemic attack, unspecified: Secondary | ICD-10-CM

## 2021-08-25 DIAGNOSIS — I679 Cerebrovascular disease, unspecified: Secondary | ICD-10-CM

## 2021-08-25 DIAGNOSIS — I4891 Unspecified atrial fibrillation: Secondary | ICD-10-CM | POA: Diagnosis not present

## 2021-08-29 ENCOUNTER — Ambulatory Visit: Payer: Medicare HMO | Admitting: Vascular Surgery

## 2021-09-04 ENCOUNTER — Telehealth: Payer: Self-pay | Admitting: Physician Assistant

## 2021-09-04 NOTE — Telephone Encounter (Signed)
   Rec'd a call from [employer] about pt monitor.  It showed Atrial fib, rate approx 100.   No sx reported, auto-detected.   Discussed w/ Dr Stanford Breed, pt d/c'd after CVA on ASA and Plavix. He recommends d/c the ASA and Plavix >> start Eliquis.   CHA2DS2-VASc = 5  (age x 2, female, HTN, vasc dz)  Attempted to contact pt by home, cell and husband's phone. No answer. Left msg on pt cell to contact the office.  Will keep trying to reach her.   Rosaria Ferries, PA-C 09/04/2021 5:47 PM

## 2021-09-05 ENCOUNTER — Ambulatory Visit: Payer: Medicare HMO | Admitting: Vascular Surgery

## 2021-09-05 MED ORDER — APIXABAN 5 MG PO TABS: 5.0000 mg | ORAL_TABLET | Freq: Two times a day (BID) | ORAL | 5 refills | Status: DC

## 2021-09-05 NOTE — Telephone Encounter (Signed)
Will forward to Pharm D for review  

## 2021-09-05 NOTE — Telephone Encounter (Signed)
Pt qualifies for Eliquis 5mg  BID based on her age and SCr despite low body weight. Agree with setting pt up with free month supply. Subsequent copay will be $45/1 month or $125/3 month supply with her [employer] Part D insurance assuming she is not in the donut hole (also takes Breo inhaler which is also a Tier 3 med like Eliquis).

## 2021-09-05 NOTE — Addendum Note (Signed)
Addended by: Alvina Filbert B on: 09/05/2021 12:39 PM   Modules accepted: Orders

## 2021-09-05 NOTE — Telephone Encounter (Addendum)
   Pt returned the call from last pm.  Her sister is present and the instructions were repeated to her as well.  Ms. Hassing is having a very hard time right now because her husband is in hospice.  She does not know how long he will live.  She feels a lot of emotional turmoil.  There are a lot of people going in and out of the house as well.  I explained to the patient and her sister that she had an episode of atrial fibrillation.  The patient was completely unaware of this.  She did not feel it at all.  She has never had palpitations, no presyncope or syncope.  I explained that because of the atrial fibrillation, she needs to be on a blood thinner, Eliquis.  I discussed some of the risks of Eliquis but explained that it would keep her from having a stroke.  I explained that it was very important to take it, but it is expensive.  She really needs to get the card for 30 days for free of the Eliquis.  Samples will also be helpful.  I will route this note to triage staff.  Hopefully, someone can prepare a bag with some samples and a 30-day card and leave it at the desk.  She will come by the office and ask at the desk about the card and the samples.  When she picks up the medication at the pharmacy, they can tell her how much it will cost her on her insurance and she will know if she can afford it going forward.  She is currently asymptomatic, just under a great deal of stress.  Her sister was very helpful and will make sure that everything happens appropriately.  Dr. Stanford Breed is aware of her new diagnosis of atrial fibrillation.  He recommends that she stop the aspirin and Plavix she was on because of her TIA, and just take the Eliquis.  The patient stated she was not taking the Plavix, but is taking the aspirin.  She is to stop the aspirin and take the Eliquis.  Keep the appointment next Friday.  Rosaria Ferries, PA-C 09/05/2021 10:48 AM

## 2021-09-05 NOTE — Addendum Note (Signed)
Addended by: Alvina Filbert B on: 09/05/2021 12:32 PM   Modules accepted: Orders

## 2021-09-05 NOTE — Telephone Encounter (Addendum)
Eliquis 5 mg #2 lot # [id number] EXP 1024  Samples given and patient took last ASA yesterday

## 2021-09-14 NOTE — Progress Notes (Addendum)
Cardiology Office Note   Date:  09/15/2021   ID:  Alexandra, House 06-16-1946, MRN [id number]  PCP:  Janith Lima, MD  Cardiologist:  Dr. Stanford Breed  No chief complaint on file.    History of Present Illness: Alexandra House is a 75 y.o. female who presents for ongoing assessment and management of nonischemic cardiomyopathy with preserved EF of 81%.  The patient during stress test developed significant ST abnormalities consistent with ischemia and transient complete heart block with infusion and was treated medically with a low threshold to proceed with catheterization if she had recurrent symptoms.  During hospitalization July 2018 secondary to respiratory failure secondary to emphysema and aspiration pneumonia she had elevated troponin.  She was recommended for an outpatient ischemic evaluation and continued on medical therapy.    They did discuss with her the possibility of undergoing cardiac catheterization and she wished to continue conservative management.  Other history includes carotid artery disease although this was nonsignificant with carotid Doppler and 2019 which showed 1 to 39% bilateral stenosis.  She also has a history of mild mitral regurg, hyperlipidemia, CVA, on ASA and Plavix ongoing tobacco abuse, and PVD followed by Dr. Scot Dock with medical therapy recommended.  Alexandra House had recent cardiac monitor which showed atrial fibrillation with a rate of 100 bpm and she was a symptomatic.  The patient was started on Eliquis and aspirin and Plavix were discontinued.  Patient's CHA2DS2-VASc score is 27 (age, female, hypertension, and vascular disease).    She was contacted by Rosaria Ferries, PA, on 09/05/2021, to inform her of her cardiac monitor results and need to start Eliquis.  She reviewed atrial fibrillation and relevance of needing to be on anticoagulation therapy.  Of note, the patient's husband is on hospice therapy and she is under a lot of emotional distress concerning this.   This patient will need a Eliquis coupon card to assist her with cost.  She comes today feeling much better.  She required some teaching concerning atrial fibrillation and the need to be on Eliquis along with reason for Eliquis prescription.  She is requesting that we refill her Eliquis prescription she only received 3 days posthospitalization.  Her sister who is with her states that she was not taking the rosuvastatin, and it was pointed out by an associate Diona Browner, NP that the patient was only taking the Eliquis 2 tablets at nighttime and not twice daily.  The patient was reminded to take her Eliquis twice daily as directed and not to take 2 tablets at nighttime.  ADDENDUM:  After patient left office was given a telemetry report concerning recent telemetry activity from her cardiac monitor.  At 637 this morning the patient had third-degree complete heart block, followed by second-degree heart block with junctional escape.  As a result of this we have decided that the patient should be admitted to telemetry floor for further evaluation and monitoring and decision on need to have permanent pacemaker insertion with EP consultation.  Prior to coming to the office Caprice Beaver, LPN did speak with the patient's husband who stated that she was not feeling well that morning was got sick and threw up.  She did mention this to me in the office clinic visit but she stated she had eaten too much [place] tonight before and was feeling badly from the nausea and vomiting.  She is going to have direct admit to the hospital for further evaluation.  Of note her husband is on hospice,  but does have significant family support at home with hospice along with sons who stay the night with him.  Past Medical History:  Diagnosis Date   Acute on chronic respiratory failure (HCC)    Anxiety    Caloric malnutrition (HCC)    Carotid stenosis    Coronary artery disease    Fibromyalgia    HLD (hyperlipidemia)    Hx of  cardiovascular stress test    a. Lex MV 11/13: + significant ECG changes and CHB with infusion; EF 81%, no ischemia,    Hx of echocardiogram    a. Echo 12/12: EF 68-34%, grade 2 diastolic dysfunction, mild MR.; b. Echo 11/13:  mild LVH, EF 60-65%   Hypertension    Neuropathy    Osteoporosis    Syncope 08/2009   a. ETT-Echo 1/11:  poor ex tol, submax exercise, no WMA or ECG changes c/w ischemia;      Past Surgical History:  Procedure Laterality Date   None       Current Outpatient Medications  Medication Sig Dispense Refill   alendronate (FOSAMAX) 70 MG tablet   11   apixaban (ELIQUIS) 5 MG TABS tablet Take 1 tablet (5 mg total) by mouth 2 (two) times daily. 60 tablet 5   apixaban (ELIQUIS) 5 MG TABS tablet Take 1 tablet (5 mg total) by mouth 2 (two) times daily. 28 tablet 0   benzonatate (TESSALON) 100 MG capsule Take 1 capsule (100 mg total) by mouth 2 (two) times daily as needed for cough. 180 capsule 0   BREO ELLIPTA 100-25 MCG/INH AEPB Inhale 1 puff into the lungs daily. 120 each 1   Cholecalciferol 125 MCG (5000 UT) capsule Take 1 capsule (5,000 Units total) by mouth daily. 90 capsule 1   EPINEPHrine (EPIPEN JR) 0.15 MG/0.3ML injection Inject 15 mg into the muscle as needed.  0   ergocalciferol (VITAMIN D2) 50000 units capsule Take 50,000 Units by mouth once a week.     ezetimibe (ZETIA) 10 MG tablet Take 1 tablet (10 mg total) by mouth daily. 30 tablet 0   guaiFENesin (MUCINEX) 600 MG 12 hr tablet Take by mouth as needed.     LORazepam (ATIVAN) 1 MG tablet TAKE 1 TABLET(1 MG) BY MOUTH EVERY 8 HOURS AS NEEDED FOR ANXIETY 270 tablet 0   Multiple Vitamins-Minerals (MULTIVITAMIN GUMMIES ADULT PO) Take 2 tablets by mouth daily.     rosuvastatin (CRESTOR) 10 MG tablet Take 1 tablet (10 mg total) by mouth at bedtime. 30 tablet 0   No current facility-administered medications for this visit.    Allergies:   Bee venom and Lipitor [atorvastatin]    Social History:  The patient   reports that she has been smoking cigarettes. She has been smoking an average of .5 packs per day. She uses smokeless tobacco. She reports current alcohol use. She reports that she does not use drugs.   Family History:  The patient's family history includes Breast cancer in her mother; CAD (age of onset: 62) in her mother; Lung cancer in her father.    ROS: All other systems are reviewed and negative. Unless otherwise mentioned in H&P    PHYSICAL EXAM: VS:  BP 120/72   Pulse 75   Ht 4\' 11"  (1.499 m)   Wt 84 lb 3.2 oz (38.2 kg)   LMP  (LMP Unknown)   SpO2 99%   BMI 17.01 kg/m  , BMI Body mass index is 17.01 kg/m. GEN: Well nourished, well developed, in  no acute distress HEENT: normal Neck: no JVD, carotid bruits, or masses Cardiac: Irregular RRR; no murmurs, rubs, or gallops, pedal pulses are palpable but extremities are cold, Doppler ultrasound (portable) completed in the room was not able to pick up on flow.  There was no edema  Respiratory:  Clear to auscultation bilaterally, normal work of breathing GI: soft, nontender, nondistended, + BS MS: no deformity or atrophy Skin: warm and dry, no rash Neuro:  Strength and sensation are intact Psych: euthymic mood, full affect   EKG:  EKG is ordered today. The ekg ordered today demonstrates normal sinus rhythm with sinus arrhythmia, motion artifact is noted with T wave inversion in inferior and T wave flattening heart rate of 75 bpm.   Recent Labs: 08/02/2021: ALT 8; BUN 18; Creatinine, Ser 1.11; Hemoglobin 11.7; Platelets 315; Potassium 4.2; Sodium 129    Lipid Panel    Component Value Date/Time   CHOL 239 (H) 08/03/2021 0354   CHOL 160 11/12/2017 1203   TRIG 75 08/03/2021 0354   HDL 56 08/03/2021 0354   HDL 54 11/12/2017 1203   CHOLHDL 4.3 08/03/2021 0354   VLDL 15 08/03/2021 0354   LDLCALC 168 (H) 08/03/2021 0354   LDLCALC 90 11/12/2017 1203      Wt Readings from Last 3 Encounters:  09/15/21 84 lb 3.2 oz (38.2 kg)   08/02/21 84 lb (38.1 kg)  04/27/21 83 lb 12.8 oz (38 kg)      Other studies Reviewed: 1. Left ventricular ejection fraction, by estimation, is 60 to 65%. The  left ventricle has normal function. The left ventricle has no regional  wall motion abnormalities. Left ventricular diastolic parameters are  consistent with Grade I diastolic  dysfunction (impaired relaxation).   2. Right ventricular systolic function is normal. The right ventricular  size is normal. There is normal pulmonary artery systolic pressure.   3. Left atrial size was mild to moderately dilated.   4. The mitral valve is normal in structure. Mild to moderate mitral valve  regurgitation. No evidence of mitral stenosis.   5. The aortic valve is normal in structure. There is mild calcification  of the aortic valve. Aortic valve regurgitation is trivial. No aortic  stenosis is present.   6. The inferior vena cava is normal in size with greater than 50%  respiratory variability, suggesting right atrial pressure of 3 mmHg.   ASSESSMENT AND PLAN:  1.  Paroxysmal atrial fibrillation: Cardiac monitor revealed paroxysmal atrial fibrillation.  The patient continues to wear this monitor and is due to return it on September 23, 2021.  She remains on Eliquis.  Extensive teaching on atrial fibrillation, paroxysmal nature of it, the need to take Eliquis as directed, is completed with the patient.  She verbalized understanding.  New prescriptions were called into [facility] for pharmacy she requested.  She is given some samples of Eliquis as well.  Aspirin was discontinued.  2.  Hyperlipidemia: She had stopped taking rosuvastatin.  Patient teaching was completed concerning this and she has agreed to take the rosuvastatin at night.  Her sister who is with her will be filling her pill bottles.  Her sister is from [place] and will be returning today but will be in touch with her to remind her to fill her pillboxes and take her medications.  3.   Chronic diastolic heart failure: No evidence of volume overload today.  Continue medications as directed with blood pressure control  4.  Hypertension: Blood pressure well controlled today  in the office.  No changes in regimen at this time.  Current medicines are reviewed at length with the patient today.  I have spent 25 min's  dedicated to the care of this patient on the date of this encounter to include pre-visit review of records, assessment, management and diagnostic testing,with shared decision making.  Labs/ tests ordered today include:None Phill Myron. [place] Pugh, ANP, AACC   09/15/2021 12:30 PM    [facility] [address] Office [phone] Fax [phone]  Notice: This dictation was prepared with Dragon dictation along with smaller phrase technology. Any transcriptional errors that result from this process are unintentional and may not be corrected upon review.

## 2021-09-15 ENCOUNTER — Other Ambulatory Visit: Payer: Self-pay

## 2021-09-15 ENCOUNTER — Encounter (HOSPITAL_COMMUNITY): Payer: Self-pay | Admitting: Cardiovascular Disease

## 2021-09-15 ENCOUNTER — Observation Stay (HOSPITAL_COMMUNITY)
Admission: AD | Admit: 2021-09-15 | Discharge: 2021-09-16 | Disposition: A | Discharge status: Home / Self Care | Payer: Medicare HMO | Source: Ambulatory Visit | Attending: Cardiology | Admitting: Cardiology

## 2021-09-15 ENCOUNTER — Telehealth: Payer: Self-pay | Admitting: Cardiology

## 2021-09-15 ENCOUNTER — Encounter: Payer: Self-pay | Admitting: Adult Health

## 2021-09-15 ENCOUNTER — Ambulatory Visit: Payer: Medicare HMO | Admitting: Adult Health

## 2021-09-15 VITALS — BP 120/72 | HR 75 | Ht 59.0 in | Wt 84.2 lb

## 2021-09-15 DIAGNOSIS — F17210 Nicotine dependence, cigarettes, uncomplicated: Secondary | ICD-10-CM | POA: Diagnosis not present

## 2021-09-15 DIAGNOSIS — I509 Heart failure, unspecified: Secondary | ICD-10-CM | POA: Insufficient documentation

## 2021-09-15 DIAGNOSIS — I5042 Chronic combined systolic (congestive) and diastolic (congestive) heart failure: Secondary | ICD-10-CM | POA: Diagnosis not present

## 2021-09-15 DIAGNOSIS — E7800 Pure hypercholesterolemia, unspecified: Secondary | ICD-10-CM

## 2021-09-15 DIAGNOSIS — Z79899 Other long term (current) drug therapy: Secondary | ICD-10-CM | POA: Diagnosis not present

## 2021-09-15 DIAGNOSIS — J449 Chronic obstructive pulmonary disease, unspecified: Secondary | ICD-10-CM | POA: Diagnosis not present

## 2021-09-15 DIAGNOSIS — Z20822 Contact with and (suspected) exposure to covid-19: Secondary | ICD-10-CM | POA: Diagnosis not present

## 2021-09-15 DIAGNOSIS — G459 Transient cerebral ischemic attack, unspecified: Secondary | ICD-10-CM

## 2021-09-15 DIAGNOSIS — I5032 Chronic diastolic (congestive) heart failure: Secondary | ICD-10-CM

## 2021-09-15 DIAGNOSIS — Z8673 Personal history of transient ischemic attack (TIA), and cerebral infarction without residual deficits: Secondary | ICD-10-CM | POA: Insufficient documentation

## 2021-09-15 DIAGNOSIS — R001 Bradycardia, unspecified: Secondary | ICD-10-CM

## 2021-09-15 DIAGNOSIS — I480 Paroxysmal atrial fibrillation: Secondary | ICD-10-CM | POA: Diagnosis not present

## 2021-09-15 DIAGNOSIS — Z7901 Long term (current) use of anticoagulants: Secondary | ICD-10-CM | POA: Insufficient documentation

## 2021-09-15 DIAGNOSIS — E785 Hyperlipidemia, unspecified: Secondary | ICD-10-CM | POA: Diagnosis present

## 2021-09-15 DIAGNOSIS — I110 Hypertensive heart disease with heart failure: Secondary | ICD-10-CM | POA: Insufficient documentation

## 2021-09-15 DIAGNOSIS — I442 Atrioventricular block, complete: Secondary | ICD-10-CM | POA: Diagnosis not present

## 2021-09-15 DIAGNOSIS — Z720 Tobacco use: Secondary | ICD-10-CM | POA: Diagnosis present

## 2021-09-15 HISTORY — DX: Unspecified diastolic (congestive) heart failure: I50.30

## 2021-09-15 HISTORY — DX: Stricture of artery: I77.1

## 2021-09-15 LAB — CBC
Hematocrit: 35.4 % (ref 34.0–46.6)
Hemoglobin: 12.1 g/dL (ref 11.1–15.9)
MCH: 30.2 pg (ref 26.6–33.0)
MCHC: 34.2 g/dL (ref 31.5–35.7)
MCV: 88 fL (ref 79–97)
Platelets: 299 10*3/uL (ref 150–450)
RBC: 4.01 x10E6/uL (ref 3.77–5.28)
RDW: 12.7 % (ref 11.7–15.4)
WBC: 12 10*3/uL — ABNORMAL HIGH (ref 3.4–10.8)

## 2021-09-15 LAB — BASIC METABOLIC PANEL
BUN/Creatinine Ratio: 19 (ref 12–28)
BUN: 23 mg/dL (ref 8–27)
CO2: 22 mmol/L (ref 20–29)
Calcium: 9.5 mg/dL (ref 8.7–10.3)
Chloride: 102 mmol/L (ref 96–106)
Creatinine, Ser: 1.22 mg/dL — ABNORMAL HIGH (ref 0.57–1.00)
Glucose: 79 mg/dL (ref 70–99)
Potassium: 4.8 mmol/L (ref 3.5–5.2)
Sodium: 137 mmol/L (ref 134–144)
eGFR: 46 mL/min/{1.73_m2} — ABNORMAL LOW (ref >59)

## 2021-09-15 LAB — RESP PANEL BY RT-PCR (FLU A&B, COVID) ARPGX2
Influenza A by PCR: NEGATIVE
Influenza B by PCR: NEGATIVE
SARS Coronavirus 2 by RT PCR: NEGATIVE

## 2021-09-15 LAB — TROPONIN I (HIGH SENSITIVITY): Troponin I (High Sensitivity): 11 ng/L (ref <18)

## 2021-09-15 MED ORDER — ROSUVASTATIN CALCIUM 5 MG PO TABS
10.0000 mg | ORAL_TABLET | Freq: Every day | ORAL | Status: DC
  Administered 2021-09-15: 10 mg via ORAL
  Filled 2021-09-15: qty 2

## 2021-09-15 MED ORDER — ONDANSETRON HCL 4 MG/2ML IJ SOLN: 4.0000 mg | Freq: Four times a day (QID) | INTRAMUSCULAR | Status: DC | PRN

## 2021-09-15 MED ORDER — EZETIMIBE 10 MG PO TABS
10.0000 mg | ORAL_TABLET | Freq: Every day | ORAL | Status: DC
  Administered 2021-09-16: 10 mg via ORAL
  Filled 2021-09-15: qty 1

## 2021-09-15 MED ORDER — APIXABAN 5 MG PO TABS
5.0000 mg | ORAL_TABLET | Freq: Two times a day (BID) | ORAL | Status: DC
  Administered 2021-09-15 – 2021-09-16 (×2): 5 mg via ORAL
  Filled 2021-09-15 (×2): qty 1

## 2021-09-15 MED ORDER — APIXABAN 5 MG PO TABS: 5.0000 mg | ORAL_TABLET | Freq: Two times a day (BID) | ORAL | 0 refills | Status: DC

## 2021-09-15 MED ORDER — ACETAMINOPHEN 325 MG PO TABS: 650.0000 mg | ORAL_TABLET | ORAL | Status: DC | PRN

## 2021-09-15 MED ORDER — LORAZEPAM 1 MG PO TABS
1.0000 mg | ORAL_TABLET | Freq: Three times a day (TID) | ORAL | Status: DC | PRN
  Administered 2021-09-15: 1 mg via ORAL
  Filled 2021-09-15: qty 1

## 2021-09-15 NOTE — Telephone Encounter (Signed)
Received call into triage. This morning at 6:37 AM patient went into third degree complete heart block. Pulled strips from [employer], showed to DOD, he reviewed and stated patient should report to ER. I contacted patient two times, mobile number did not go through, voicemail not set up. I called house number listed on chart, husband answered the phone- stating that she was on the way here for her visit with Jory Sims, NP at 8:50 AM. Husband stated at around 6 this AM she became sick and threw up. Will route to Jory Sims, NP to make her aware. Will check on patient when she arrives here at the office.

## 2021-09-15 NOTE — Telephone Encounter (Signed)
SIERRA FROM [facility] [employer] IS CALLING WITH A CRITICAL EKG

## 2021-09-15 NOTE — Telephone Encounter (Signed)
After the events today. I took the monitor report gave to primary CMA, Alexandra House- to have NP review at visit. Monitor results was misplaced during visit, after revisiting this, monitor results then reviewed by NP and decided to do direct admit under DOD Dr.Thomas Claiborne Billings.  I contacted bed control, received notification they would be contacting her to get a bed. I then, contacted the patient. Went into discussion of what third degree heart block was, and how important it was to be seen. Patient then stated "I will not go to the hospital". Son then took over the phone call- spoke with Alexandra House. He was advised of the seriousness of this situation. He states his dad is not doing well and is in hospice and his mother does not like to leave him, I did advise the results could be fatal in this situation if left untreated. Patient was symptomatic this morning per son, she was unwell and he held her to stop her from passing out and kept her up. Patient on the phone at recent call around 4:15 stating that she did not feel well and had been in the bed since her visit this morning.  I did advise the son and patient that they would be calling to give her bed option, as an adult she has the right to make her own decision but I would highly recommend that she go and get this treated.   Son Alexandra House) returned call back around 4:45 PM, stating they will wait for call and once received they will take her.   They were all very thankful, advised to call back if any concerns/questions.

## 2021-09-15 NOTE — H&P (Addendum)
History & Physical    Patient ID: JERRILYNN MIKOWSKI MRN: [id number], DOB/AGE: Aug 22, 1946   Admit date: 09/15/2021   Primary Physician: Janith Lima, MD Primary Cardiologist: Kirk Ruths, MD  Patient Profile    75 y/o ? w/ a h/o dilated cardiomyopathy and HFimpEF, HTN, HL, PAF, carotid dzs, L subclavian stenosis, TIA, depression, tobacco abuse, COPD, and recently dx PAF, who presents for direct admission, related to [place].  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 Echo 12/12: EF 50-55%; b. Echo 11/13:  mild LVH, EF 60-65%; c. 04/2017 Echo: EF 45-50%; d. 06/2018 Echo EF 55-65%; e. 07/2021 Echo: EF 60-65%, no rwma, GrI DD, mild-mod LAD. Mild-mod MR.   Acute on chronic respiratory failure (HCC)    Anxiety    Caloric malnutrition (HCC)    Carotid stenosis    Coronary artery disease    Fibromyalgia    HLD (hyperlipidemia)    Hx of cardiovascular stress test    a. Lex MV 11/13: + significant ECG changes and CHB with infusion; EF 81%, no ischemia; b. 06/2018 MV: EF 45% (55-60% visually). No ischemia/infarct-->Low risk.   Hypertension    Neuropathy    Osteoporosis    Stenosis of left subclavian artery ([place])    Syncope 08/2009   a. ETT-Echo 1/11:  poor ex tol, submax exercise, no WMA or ECG changes c/w ischemia;     TIA (transient ischemic attack) 07/2021    Past Surgical History:  Procedure Laterality Date   None       Allergies  Allergies  Allergen Reactions   Bee Venom Anaphylaxis and Hives   Lipitor [Atorvastatin] Other (See Comments)    alopecia    History of Present Illness    75 y/o ? w/ a h/o dilated cardiomyopathy and HFimpEF, HTN, HL, PAF, carotid dzs, L subclavian stenosis, TIA, syncope, depression, tobacco abuse, and COPD.  She previously underwent stress testing in 2013 that showed no evidence of ischemia, though she did have transient complete heart block with Lexiscan  injection.  She was managed medically.  In July 2018, she was admitted with bradycardia and hypotension in the setting of septic shock and respiratory failure.  Troponin rose to 2.05.  Echocardiogram showed an EF of 45% with basal mid anteroseptal, basal mid inferoseptal, basal mid inferior, and apical hypokinesis and moderate MR.  During admission, she was noted to be bradycardic with rates into the 30s necessitating discontinuation of AV nodal blocking agents.  She did have a repeat echo April 27, 2017 which continues to show depressed EF of 45 to 50%.  She was offered diagnostic catheterization however, deferred.  She underwent repeat stress testing in September 2019 which showed no ischemia or infarct.  EF was recorded at 45% though visually it was felt to be closer to 55 to 60%.  Echo in September 2019 showed improved EF to 55 to 60%.  On October 19, she was admitted following an episode of garbled speech and difficulty word finding while riding.  Symptoms lasted about an hour before she went to bed and then went to the ED the next day.  CT of the head was without acute intracranial findings.  MRI/a were negative.  It was noted that she had significant difference between blood pressures in left and right arms and CTA of the neck was reevaluated and showed moderate to severe stenoses within the  proximal left subclavian artery.  Vascular surgery consultation was advised however patient wished to be discharged and outpatient vascular surgery appointment was to be had.  At discharge, she was placed on a 30-day event monitor to assess for arrhythmia in the setting of presumed TIA.  She was also discharged home on aspirin and Plavix.  On November 21, our call staff was contacted as patient had new onset atrial fibrillation.  Patient was contacted and advised to stop aspirin and Plavix and begin Eliquis 5 mg twice daily.    This morning, patient awoke around 6 AM feeling nauseated.  Sometime around 6:30 AM, she  vomited x1 and shortly thereafter felt lightheaded for a few seconds.  She then got dressed and came to her cardiology appointment this morning.  Initially, it was noted that with the exception of her vomiting episode this morning, she was doing well.  Our office was subsequently contacted to report that patient had an episode of complete heart block.  As result, patient was contacted and advised to present to the hospital for direct admission.  Patient says she has felt well throughout the day.  She denies chest pain, palpitations, dyspnea, pnd, orthopnea, n, v, dizziness, syncope, edema, weight gain, or early satiety.   Home Medications    Prior to Admission medications   Medication Sig Start Date End Date Taking? Authorizing Provider  alendronate (FOSAMAX) 70 MG tablet  02/27/18   [provider]  apixaban (ELIQUIS) 5 MG TABS tablet Take 1 tablet (5 mg total) by mouth 2 (two) times daily. 09/05/21   Barrett, Evelene Croon, PA-C  apixaban (ELIQUIS) 5 MG TABS tablet Take 1 tablet (5 mg total) by mouth 2 (two) times daily. 09/15/21   Lendon Colonel, NP  benzonatate (TESSALON) 100 MG capsule Take 1 capsule (100 mg total) by mouth 2 (two) times daily as needed for cough. 06/08/21   Janith Lima, MD  BREO ELLIPTA 100-25 MCG/INH AEPB Inhale 1 puff into the lungs daily. 12/06/20   Janith Lima, MD  Cholecalciferol 125 MCG (5000 UT) capsule Take 1 capsule (5,000 Units total) by mouth daily. 12/06/20   Janith Lima, MD  EPINEPHrine (EPIPEN JR) 0.15 MG/0.3ML injection Inject 15 mg into the muscle as needed. 05/28/17   [provider]  ergocalciferol (VITAMIN D2) 50000 units capsule Take 50,000 Units by mouth once a week.    [provider]  ezetimibe (ZETIA) 10 MG tablet Take 1 tablet (10 mg total) by mouth daily. 08/04/21   Elgergawy, Silver Huguenin, MD  guaiFENesin (MUCINEX) 600 MG 12 hr tablet Take by mouth as needed.    [provider]  LORazepam (ATIVAN) 1 MG tablet TAKE  1 TABLET(1 MG) BY MOUTH EVERY 8 HOURS AS NEEDED FOR ANXIETY 07/21/21   Janith Lima, MD  Multiple Vitamins-Minerals (MULTIVITAMIN GUMMIES ADULT PO) Take 2 tablets by mouth daily.    [provider]  rosuvastatin (CRESTOR) 10 MG tablet Take 1 tablet (10 mg total) by mouth at bedtime. 08/03/21   Elgergawy, Silver Huguenin, MD    Family History    Family History  Problem Relation Age of Onset   CAD Mother 13       Status post PCI   Breast cancer Mother    Lung cancer Father    Colon cancer Neg Hx    She indicated that her mother is deceased. She indicated that her father is deceased. She indicated that her maternal grandmother is deceased. She indicated  that her maternal grandfather is deceased. She indicated that her paternal grandmother is deceased. She indicated that her paternal grandfather is deceased. She indicated that the status of her neg hx is unknown.   Social History    Social History   Socioeconomic History   Marital status: Married    Spouse name: Not on file   Number of children: 2   Years of education: Not on file   Highest education level: Not on file  Occupational History   Not on file  Tobacco Use   Smoking status: Every Day    Packs/day: 0.50    Types: Cigarettes   Smokeless tobacco: Current  Vaping Use   Vaping Use: Never used  Substance and Sexual Activity   Alcohol use: Yes    Comment: glass wine once every 6 mos. per patient   Drug use: No   Sexual activity: Not on file  Other Topics Concern   Not on file  Social History Narrative   Lives with husband   Social Determinants of Health   Financial Resource Strain: Low Risk    Difficulty of Paying Living Expenses: Not hard at all  Food Insecurity: No Food Insecurity   Worried About Charity fundraiser in the Last Year: Never true   [place] in the Last Year: Never true  Transportation Needs: No Transportation Needs   Lack of Transportation (Medical): No   Lack of Transportation  (Non-Medical): No  Physical Activity: Inactive   Days of Exercise per Week: 0 days   Minutes of Exercise per Session: 0 min  Stress: No Stress Concern Present   Feeling of Stress : Not at all  Social Connections: Socially Integrated   Frequency of Communication with Friends and Family: More than three times a week   Frequency of Social Gatherings with Friends and Family: More than three times a week   Attends Religious Services: More than 4 times per year   Active Member of [employer] or Organizations: Yes   Attends Music therapist: More than 4 times per year   Marital Status: Married  Human resources officer Violence: Not At Risk   Fear of Current or Ex-Partner: No   Emotionally Abused: No   Physically Abused: No   Sexually Abused: No     Review of Systems    General:  No chills, fever, night sweats or weight changes.  Cardiovascular:  No chest pain, dyspnea on exertion, edema, orthopnea, palpitations, paroxysmal nocturnal dyspnea. +++  Brief episode of presyncope this AM. Dermatological: No rash, lesions/masses Respiratory: No cough, dyspnea Urologic: No hematuria, dysuria Abdominal:   +++ nausea, +++ vomiting this a.m., no diarrhea, bright red blood per rectum, melena, or hematemesis Neurologic:  No visual changes, wkns, changes in mental status. All other systems reviewed and are otherwise negative except as noted above.  Physical Exam    Blood pressure (!) 148/64, pulse 75, temperature 98.1 F (36.7 C), temperature source Oral, resp. rate 18, SpO2 100 %.  General: Pleasant, frail/thin, NAD Psych: Normal affect. Neuro: Alert and oriented X 3. Moves all extremities spontaneously. HEENT: Normal  Neck: Supple bilateral carotid bruits.  No JVD. Lungs:  Resp regular and unlabored, CTA. Heart: RRR no s3, s4, or murmurs. Abdomen: Soft, non-tender, non-distended, BS + x 4.  Extremities: No clubbing, cyanosis or edema. DP/PT 2+, Radials 2+ and equal bilaterally.  Labs     Cardiac Enzymes No results for input(s): TROPONINIHS in the last 720 hours.  Lab Results  Component Value Date   WBC 12.0 (H) 09/15/2021   HGB 12.1 09/15/2021   HCT 35.4 09/15/2021   MCV 88 09/15/2021   PLT 299 09/15/2021    Recent Labs  Lab 09/15/21 1014  NA 137  K 4.8  CL 102  CO2 22  BUN 23  CREATININE 1.22*  CALCIUM 9.5  GLUCOSE 79   Lab Results  Component Value Date   CHOL 239 (H) 08/03/2021   HDL 56 08/03/2021   LDLCALC 168 (H) 08/03/2021   TRIG 75 08/03/2021     Radiology Studies    No results found.  ECG & Cardiac Imaging    pending - personally reviewed.  Pre[employer]ice strips: 09/15/2021 6010: Sinus rhythm with PACs followed by complete heart block (strips to be placed on physical chart)  Assessment & Plan    1.  Complete heart block: Patient has been wearing an event monitor in the setting of recent TIA.  She was initially found to have paroxysmal atrial fibrillation on November 21 and presented for follow-up in our office today.  This morning, after awakening, she developed nausea followed by 1 episode of vomiting.  She says that following this episode, she did become lightheaded briefly.  After her office visit this morning, our office was contacted to report that she had an episode of complete heart block at 6:37 AM.  Patient was contacted and advised to present for admission.  On questioning, patient believes she was likely vomiting around that time.  She is not on any AV nodal blocking agents at home and does have a history of bradycardia when on AV nodal blocking agents in the past.  Also noted that she had a history of complete heart block during a Lexiscan Myoview back in 2018.  She will be admitted to progressive care and monitor on telemetry tonight.  Follow-up labs.  We will ask electrophysiology to evaluate patient and review strips in the morning.  2.  Paroxysmal atrial fibrillation: Recently diagnosed by event monitoring.  She is not on any AV  nodal blocking agents.  Watch telemetry.  Follow-up labs outlined above.  Continue Eliquis.  CHA2DS2-VASc equals 5-7 (recent TIA but not stroke).  3.  Chronic heart failure with improved ejection fraction: Patient with prior history of cardiomyopathy with EF of 45% in 2018.  EF noted to improve in 2019 and recent echo in October continues to show normal LV function.  Euvolemic on examination with stable heart rate and blood pressure.  4.  TIA:  recent admission.  CT head, mri/a brain/neck w/o acute findings.  Loud carotid bruits but no significant carotid dzs.  Cont eliquis in setting of above.  Asa/plavix recently d/c'd.  Continue statin therapy, though she notes she had not been taking this at home.  5.  L subclavian stenosis: Recently seen on CTA of the neck vessels.  Denies L arm claudication.  Per discharge summary, patient to have vascular surgical follow-up.  Continue statin therapy.  6.  Hyperlipidemia: On statin but has not been taking.  Discussed the importance of compliance given #4/5.  7.  Tobacco abuse: Still smoking half pack a day.  Complete cessation advised.  Signed, Murray Hodgkins, NP 09/15/2021, 8:52 PM   Patient seen and discussed with PA Nash Mantis, I agree with his documentation. 75 yo feamel history of TIA, hyperlipidemia, PAD/subclavian stenosis, PAF. She has been wearing a home monitor after her recent TIA which today showed an episode of complete heart blocker (strips printed  and placed in paper chart). She is not on any av nodal agents. From history it appears she may have been vomiting at the time of this rhythm and thus possible it was vagally mediated. Interestingly and of unclear relation notes indicate episode of transient complete heart block after receiving regadenson in the past. She denies any syncopal episodes. Tele here shows she is in NSR. Would monitor overnight, would anticipate likely discharge tomorrow with ongoing monitoring by her home monitor unless  significant recurrent bradyarrhythmias overnight. If recurrent heart block in absence of potential vagal trigger would need to consider pacemaker at that time.    Carlyle Dolly MD

## 2021-09-15 NOTE — Patient Instructions (Signed)
Medication Instructions:  No Changes *If you need a refill on your cardiac medications before your next appointment, please call your pharmacy*   Lab Work: BMET, CBC : Today If you have labs (blood work) drawn today and your tests are completely normal, you will receive your results only by: [place] (if you have MyChart) OR A paper copy in the mail If you have any lab test that is abnormal or we need to change your treatment, we will call you to review the results.   Testing/Procedures: No Testing   Follow-Up: At [facility], you and your health needs are our priority.  As part of our continuing mission to provide you with exceptional heart care, we have created designated Provider Care Teams.  These Care Teams include your primary Cardiologist (physician) and Advanced Practice Providers (APPs -  Physician Assistants and Nurse Practitioners) who all work together to provide you with the care you need, when you need it.  We recommend signing up for the patient portal called "MyChart".  Sign up information is provided on this After Visit Summary.  MyChart is used to connect with patients for Virtual Visits (Telemedicine).  Patients are able to view lab/test results, encounter notes, upcoming appointments, etc.  Non-urgent messages can be sent to your provider as well.   To learn more about what you can do with MyChart, go to [email or web address].    Your next appointment:   3 month(s)  The format for your next appointment:   In Person  Provider:   Jory Sims, DNP, ANP    Then, Kirk Ruths, MD will plan to see you again in 6 month(s).

## 2021-09-15 NOTE — Progress Notes (Signed)
Progress Note  Patient Name: Alexandra House Date of Encounter: 09/16/2021  [facility] Cardiologist: Kirk Ruths, MD   Subjective   Doing well this morning. No chest pain, SOB, lightheadedness or dizziness. Asking to go home today.  Inpatient Medications    Scheduled Meds:  Continuous Infusions:  PRN Meds:    Vital Signs    Vitals:   09/16/21 0010 09/16/21 0405 09/16/21 0524 09/16/21 0729  BP: (!) 92/54  (!) 122/51 (!) 99/49  Pulse: 74  63 60  Resp: 17  18 16   Temp: 98.3 F (36.8 C)  98.5 F (36.9 C) 98.7 F (37.1 C)  TempSrc: Oral  Oral Oral  SpO2: 98%  96% 94%  Weight:  38.6 kg    Height:       No intake or output data in the 24 hours ending 09/16/21 0952 Last 3 Weights 09/16/2021 09/15/2021 09/15/2021  Weight (lbs) 85 lb 1.6 oz 85 lb 3.2 oz 84 lb 3.2 oz  Weight (kg) 38.6 kg 38.646 kg 38.193 kg      Telemetry    NSR, occasional dropped beats; PACs; no CHB - Personally Reviewed  ECG    NSR with sinus arrhythmia - Personally Reviewed  Physical Exam   GEN: No acute distress.   Neck: No JVD Cardiac: RRR, no murmurs, rubs, or gallops.  Respiratory: Clear to auscultation bilaterally. GI: Soft, nontender, non-distended  MS: No edema; No deformity. Neuro:  Nonfocal  Psych: Normal affect   Labs    High Sensitivity Troponin:   Recent Labs  Lab 09/15/21 2153 09/16/21 0023  TROPONINIHS 11 9     Chemistry Recent Labs  Lab 09/15/21 1014 09/16/21 0023  NA 137 136  K 4.8 3.9  CL 102 107  CO2 22 23  GLUCOSE 79 87  BUN 23 23  CREATININE 1.22* 1.12*  CALCIUM 9.5 8.6*  MG  --  2.0  GFRNONAA  --  51*  ANIONGAP  --  6    Lipids No results for input(s): CHOL, TRIG, HDL, LABVLDL, LDLCALC, CHOLHDL in the last 168 hours.  Hematology Recent Labs  Lab 09/15/21 1014  WBC 12.0*  RBC 4.01  HGB 12.1  HCT 35.4  MCV 88  MCH 30.2  MCHC 34.2  RDW 12.7  PLT 299   Thyroid No results for input(s): TSH, FREET4 in the last 168 hours.  BNPNo  results for input(s): BNP, PROBNP in the last 168 hours.  DDimer No results for input(s): DDIMER in the last 168 hours.   Radiology    No results found.  Cardiac Studies   TTE 08/03/21: IMPRESSIONS   1. Left ventricular ejection fraction, by estimation, is 60 to 65%. The  left ventricle has normal function. The left ventricle has no regional  wall motion abnormalities. Left ventricular diastolic parameters are  consistent with Grade I diastolic  dysfunction (impaired relaxation).   2. Right ventricular systolic function is normal. The right ventricular  size is normal. There is normal pulmonary artery systolic pressure.   3. Left atrial size was mild to moderately dilated.   4. The mitral valve is normal in structure. Mild to moderate mitral valve  regurgitation. No evidence of mitral stenosis.   5. The aortic valve is normal in structure. There is mild calcification  of the aortic valve. Aortic valve regurgitation is trivial. No aortic  stenosis is present.   6. The inferior vena cava is normal in size with greater than 50%  respiratory variability, suggesting  right atrial pressure of 3 mmHg.   Myoview 06/2018: Nuclear stress EF: 45%. Visually the EF is normal - around 55-60%. There was no ST segment deviation noted during stress. The study is normal. no evidence of ischemia or previous infarction This is a low risk study.  Patient Profile     75 y.o. female with history of HFimpEF, paroxysmal Afib, HTN, and HLD who was found to have CHB on home monitor while vomiting now referred to [facility] hospital for further work-up.  Assessment & Plan    #Complete Heart Block: Detected on home monitor that she was wearing for recent TIA. She was found to have Afib as below, however, developed CHB in the setting of nausea and vomiting likely due to increased vagal tone (has had bradycardia with BB and CHB with lexiscan in the past). Otherwise, she denies history of lightheadedness, dizziness,  prior syncope. No heart block on monitor overnight. Discussed with EP and okay to go home giving reassuring tele and ECG overnight with no high risk features. Will plan for close follow-up with Dr. Stanford Breed. -Discussed with EP, no need for PPM at this time and reassuring tele/ECG with no high risk features -Suspect episode was due to high vagal tone with vomiting as patient has no reported history of lightheadedness, dizziness, syncope  -Okay to discharge home with follow-up with Dr. Stanford Breed -Continue home monitor (currently in place)  #Paroxysmal Afib: Recently diagnosed on cardiac monitor that was placed for TIA. Not on nodal agents due to bradycardia as above. On apixaban for [facility] for CHADs-vac 5-7. -Continue apixaban 5mg  BID -No nodal agents given history of bradycardia with them in the past  #Chronic HF with Improved EF: Has history of CM with EF 45% in 2018 managed conservatively as patient declined cath. Most recent TTE with LVEF 60-65%. Currently euvolemic and compsenated. -Follow-up with Dr. Stanford Breed as out-patient  #Recent TIA: CT head and MRI brain without acute findings. Monitor with Afib now on apixaban. -Continue apixaban for [facility]  #Left Subclavian Stenosis: Detected on CTA of the neck. Planned for vascular surgery follow-up as outpatient. -Follow-up with vascular as out-patient  #HLD: -Continue crestor 10mg  daily  For questions or updates, please contact [place] Please consult [email or web address] for contact info under        Signed, Freada Bergeron, MD  09/16/2021, 9:52 AM

## 2021-09-16 DIAGNOSIS — Z20822 Contact with and (suspected) exposure to covid-19: Secondary | ICD-10-CM | POA: Diagnosis not present

## 2021-09-16 DIAGNOSIS — J449 Chronic obstructive pulmonary disease, unspecified: Secondary | ICD-10-CM | POA: Diagnosis not present

## 2021-09-16 DIAGNOSIS — I442 Atrioventricular block, complete: Secondary | ICD-10-CM

## 2021-09-16 DIAGNOSIS — F17210 Nicotine dependence, cigarettes, uncomplicated: Secondary | ICD-10-CM | POA: Diagnosis not present

## 2021-09-16 DIAGNOSIS — I509 Heart failure, unspecified: Secondary | ICD-10-CM | POA: Diagnosis not present

## 2021-09-16 DIAGNOSIS — I480 Paroxysmal atrial fibrillation: Secondary | ICD-10-CM | POA: Diagnosis not present

## 2021-09-16 DIAGNOSIS — Z8673 Personal history of transient ischemic attack (TIA), and cerebral infarction without residual deficits: Secondary | ICD-10-CM | POA: Diagnosis not present

## 2021-09-16 DIAGNOSIS — Z7901 Long term (current) use of anticoagulants: Secondary | ICD-10-CM | POA: Diagnosis not present

## 2021-09-16 DIAGNOSIS — I110 Hypertensive heart disease with heart failure: Secondary | ICD-10-CM | POA: Diagnosis not present

## 2021-09-16 LAB — BASIC METABOLIC PANEL
Anion gap: 6 (ref 5–15)
BUN: 23 mg/dL (ref 8–23)
CO2: 23 mmol/L (ref 22–32)
Calcium: 8.6 mg/dL — ABNORMAL LOW (ref 8.9–10.3)
Chloride: 107 mmol/L (ref 98–111)
Creatinine, Ser: 1.12 mg/dL — ABNORMAL HIGH (ref 0.44–1.00)
GFR, Estimated: 51 mL/min — ABNORMAL LOW (ref >60)
Glucose, Bld: 87 mg/dL (ref 70–99)
Potassium: 3.9 mmol/L (ref 3.5–5.1)
Sodium: 136 mmol/L (ref 135–145)

## 2021-09-16 LAB — TROPONIN I (HIGH SENSITIVITY): Troponin I (High Sensitivity): 9 ng/L (ref <18)

## 2021-09-16 LAB — MAGNESIUM: Magnesium: 2 mg/dL (ref 1.7–2.4)

## 2021-09-16 NOTE — Discharge Instructions (Signed)

## 2021-09-16 NOTE — Discharge Summary (Addendum)
Discharge Summary    Patient ID: Alexandra House MRN: [id number]; DOB: May 07, 1946  Admit date: 09/15/2021 Discharge date: 09/16/2021  PCP:  Janith Lima, MD   [facility] Providers Cardiologist:  Kirk Ruths, MD     Discharge Diagnoses    Principal Problem:   Complete heart block [facility]) Active Problems:   Tobacco abuse   Hyperlipidemia with target LDL less than 130   Paroxysmal atrial fibrillation ([place])    Diagnostic Studies/Procedures    N/a  _____________   History of Present Illness     Alexandra House is a 75 y.o. female with a h/o dilated cardiomyopathy and HFimpEF, HTN, HL, PAF, carotid dzs, L subclavian stenosis, TIA, syncope, depression, tobacco abuse, and COPD.  She previously underwent stress testing in 2013 that showed no evidence of ischemia, though she did have transient complete heart block with Lexiscan injection.  She was managed medically.  In July 2018, she was admitted with bradycardia and hypotension in the setting of septic shock and respiratory failure.  Troponin rose to 2.05.  Echocardiogram showed an EF of 45% with basal mid anteroseptal, basal mid inferoseptal, basal mid inferior, and apical hypokinesis and moderate MR.  During admission, she was noted to be bradycardic with rates into the 30s necessitating discontinuation of AV nodal blocking agents.  She did have a repeat echo April 27, 2017 which continues to show depressed EF of 45 to 50%.  She was offered diagnostic catheterization however, deferred.  She underwent repeat stress testing in September 2019 which showed no ischemia or infarct.  EF was recorded at 45% though visually it was felt to be closer to 55 to 60%.  Echo in September 2019 showed improved EF to 55 to 60%.   On October 19, she was admitted following an episode of garbled speech and difficulty word finding while riding.  Symptoms lasted about an hour before she went to bed and then went to the ED the next day.  CT of the head was  without acute intracranial findings.  MRI/a were negative.  It was noted that she had significant difference between blood pressures in left and right arms and CTA of the neck was reevaluated and showed moderate to severe stenoses within the proximal left subclavian artery.  Vascular surgery consultation was advised however patient wished to be discharged and outpatient vascular surgery appointment was to be had.  At discharge, she was placed on a 30-day event monitor to assess for arrhythmia in the setting of presumed TIA.  She was also discharged home on aspirin and Plavix.  On November 21, our call staff was contacted as patient had new onset atrial fibrillation.  Patient was contacted and advised to stop aspirin and Plavix and begin Eliquis 5 mg twice daily.     This morning, patient awoke around 6 AM feeling nauseated.  Sometime around 6:30 AM, she vomited x1 and shortly thereafter felt lightheaded for a few seconds.  She then got dressed and came to her cardiology appointment this morning.  Initially, it was noted that with the exception of her vomiting episode this morning, she was doing well.  Our office was subsequently contacted to report that patient had an episode of complete heart block.  As result, patient was contacted and advised to present to the hospital for direct admission.  Patient says she has felt well throughout the day.  She denies chest pain, palpitations, dyspnea, pnd, orthopnea, n, v, dizziness, syncope, edema, weight gain, or early satiety.  Hospital Course     Complete heart block: Detected on home monitor that she was wearing for recent TIA. She was found to have Afib as below, however, developed CHB in the setting of nausea and vomiting likely due to increased vagal tone (has had bradycardia with BB and CHB with lexiscan in the past). Otherwise, she denies history of lightheadedness, dizziness, prior syncope. No heart block on monitor overnight. Case was discussed with EP by  Dr. Johney Frame and okay to go home given reassuring tele and ECG with no high risk features.  -- continue home monitor   Paroxysmal atrial fibrillation: Recently diagnosed by event monitoring. She is not on any AV nodal blocking agents.  -- Continue Eliquis.  CHA2DS2-VASc equals 5-7 (recent TIA but not stroke).  Chronic heart failure with improved ejection fraction: Patient with prior history of cardiomyopathy with EF of 45% in 2018.  EF noted to improve in 2019 and recent echo in October continues to show normal LV function. -- Euvolemic at discharge   TIA:  recent admission.  CT head, mri/a brain/neck w/o acute findings.  Loud carotid bruits but no significant carotid dzs.   -- Cont eliquis in setting of above.  Asa/plavix recently d/c'd.   -- Cont statin therapy   L subclavian stenosis: Recently seen on CTA of the neck vessels.  Denies L arm claudication.  -- Per prior discharge summary, patient to have vascular surgical follow-up. -- Continue statin therapy.  Hyperlipidemia: On statin   Tobacco abuse: Still smoking half pack a day.   -- Complete cessation advised.    Did the patient have an acute coronary syndrome (MI, NSTEMI, STEMI, etc) this admission?:  No                               Did the patient have a percutaneous coronary intervention (stent / angioplasty)?:  No.          _____________  Discharge Vitals Blood pressure (!) 99/49, pulse 60, temperature 98.7 F (37.1 C), temperature source Oral, resp. rate 16, height 4\' 11"  (1.499 m), weight 38.6 kg, SpO2 94 %.  Filed Weights   09/15/21 1955 09/16/21 0405  Weight: 38.6 kg 38.6 kg    Labs & Radiologic Studies    CBC Recent Labs    09/15/21 1014  WBC 12.0*  HGB 12.1  HCT 35.4  MCV 88  PLT 283   Basic Metabolic Panel Recent Labs    09/15/21 1014 09/16/21 0023  NA 137 136  K 4.8 3.9  CL 102 107  CO2 22 23  GLUCOSE 79 87  BUN 23 23  CREATININE 1.22* 1.12*  CALCIUM 9.5 8.6*  MG  --  2.0   Liver  Function Tests No results for input(s): AST, ALT, ALKPHOS, BILITOT, PROT, ALBUMIN in the last 72 hours. No results for input(s): LIPASE, AMYLASE in the last 72 hours. High Sensitivity Troponin:   Recent Labs  Lab 09/15/21 2153 09/16/21 0023  TROPONINIHS 11 9    BNP Invalid input(s): POCBNP D-Dimer No results for input(s): DDIMER in the last 72 hours. Hemoglobin A1C No results for input(s): HGBA1C in the last 72 hours. Fasting Lipid Panel No results for input(s): CHOL, HDL, LDLCALC, TRIG, CHOLHDL, LDLDIRECT in the last 72 hours. Thyroid Function Tests No results for input(s): TSH, T4TOTAL, T3FREE, THYROIDAB in the last 72 hours.  Invalid input(s): FREET3 _____________  No results found. Disposition   Pt is  being discharged home today in good condition.  Follow-up Plans & Appointments     Follow-up Information     Lelon Perla, MD Follow up.   Specialty: Cardiology Why: Office will call you with a follow up appt within 2-3 business days Contact information: [address] [place] [address] [phone]                Discharge Instructions     Diet - low sodium heart healthy   Complete by: As directed    Increase activity slowly   Complete by: As directed        Discharge Medications   Allergies as of 09/16/2021       Reactions   Bee Venom Anaphylaxis, Hives   Lipitor [atorvastatin] Other (See Comments)   alopecia        Medication List     STOP taking these medications    aspirin EC 81 MG tablet       TAKE these medications    apixaban 5 MG Tabs tablet Commonly known as: ELIQUIS Take 1 tablet (5 mg total) by mouth 2 (two) times daily. What changed:  how much to take when to take this   benzonatate 100 MG capsule Commonly known as: TESSALON Take 1 capsule (100 mg total) by mouth 2 (two) times daily as needed for cough. What changed: when to take this   Breo Ellipta 100-25 MCG/ACT Aepb Generic drug:  fluticasone furoate-vilanterol Inhale 1 puff into the lungs daily.   Cholecalciferol 125 MCG (5000 UT) capsule Take 1 capsule (5,000 Units total) by mouth daily.   EPINEPHrine 0.15 MG/0.3ML injection Commonly known as: EPIPEN JR Inject 15 mg into the muscle as needed.   ergocalciferol 1.25 MG (50000 UT) capsule Commonly known as: VITAMIN D2 Take 50,000 Units by mouth every Monday.   ezetimibe 10 MG tablet Commonly known as: ZETIA Take 1 tablet (10 mg total) by mouth daily.   LORazepam 1 MG tablet Commonly known as: ATIVAN TAKE 1 TABLET(1 MG) BY MOUTH EVERY 8 HOURS AS NEEDED FOR ANXIETY What changed: See the new instructions.   rosuvastatin 10 MG tablet Commonly known as: CRESTOR Take 1 tablet (10 mg total) by mouth at bedtime.           Outstanding Labs/Studies   N/a   Duration of Discharge Encounter   Greater than 30 minutes including physician time.  Signed, Reino Bellis, NP 09/16/2021, 10:29 AM   Patient seen and examined and agree with Reino Bellis, NP as detailed above. Please see progress note from today for full details.  Gwyndolyn Kaufman, MD

## 2021-09-18 NOTE — Progress Notes (Signed)
[facility] [place]  ASSESSMENT / PLAN: 75 y.o. female with left subclavian artery stenosis which is asymptomatic.  Patient has no symptoms of left arm claudication or ischemic rest pain.  She has no signs or symptoms of vertebrobasilar insufficiency.  I counseled her that this is a incidental finding.   Recommend best medical therapy for atherosclerosis: Complete cessation from all tobacco products. Blood glucose control with goal A1c < 7%. Blood pressure control with goal blood pressure < 140/90 mmHg. Lipid reduction therapy with goal LDL-C <100 mg/dL (<70 if symptomatic from PAD).  Aspirin 81mg  PO QD.  Atorvastatin 40-80mg  PO QD (or other "high intensity" statin therapy).  Follow-up with me as needed.  CHIEF COMPLAINT: Radiology finding follow-up  HISTORY OF PRESENT ILLNESS: Alexandra House is a 75 y.o. female currently admitted to the hospital for TIA.  The patient had a CT angiogram of her head neck.  This identified left subclavian artery stenosis.  She had a lower blood pressure in her left arm during her hospital stay.  She is otherwise asymptomatic from a left arm standpoint.  She can do overhead activities without cramping discomfort in her arm.  She has no resting pain in her left arm.  She has no dizziness when she uses left arm.  She reports only dizziness when changing positions (fairly classic orthostatic symptoms).  Past Medical History:  Diagnosis Date   (HFimpEF) heart failure with improved ejection fraction ([place])    a. Echo 12/12: EF 50-55%; b. Echo 11/13:  mild LVH, EF 60-65%; c. 04/2017 Echo: EF 45-50%; d. 06/2018 Echo EF 55-65%; e. 07/2021 Echo: EF 60-65%, no rwma, GrI DD, mild-mod LAD. Mild-mod MR.   Acute on chronic respiratory failure (HCC)    Anxiety    Caloric malnutrition (H[facility])    Carotid stenosis    Coronary artery disease    Fibromyalgia    HLD (hyperlipidemia)    Hx of cardiovascular stress test    a. Lex MV 11/13: + significant ECG  changes and CHB with infusion; EF 81%, no ischemia; b. 06/2018 MV: EF 45% (55-60% visually). No ischemia/infarct-->Low risk.   Hypertension    Neuropathy    Osteoporosis    Stenosis of left subclavian artery ([place])    Syncope 08/2009   a. ETT-Echo 1/11:  poor ex tol, submax exercise, no WMA or ECG changes c/w ischemia;     TIA (transient ischemic attack) 07/2021    Past Surgical History:  Procedure Laterality Date   None      Family History  Problem Relation Age of Onset   CAD Mother 68       Status post PCI   Breast cancer Mother    Lung cancer Father    Colon cancer Neg Hx     Social History   Socioeconomic History   Marital status: Married    Spouse name: Not on file   Number of children: 2   Years of education: Not on file   Highest education level: Not on file  Occupational History   Not on file  Tobacco Use   Smoking status: Every Day    Packs/day: 0.50    Types: Cigarettes   Smokeless tobacco: Current  Vaping Use   Vaping Use: Never used  Substance and Sexual Activity   Alcohol use: Yes    Comment: glass wine once every 6 mos. per patient   Drug use: No   Sexual activity: Not on file  Other Topics Concern  Not on file  Social History Narrative   Lives with husband   Social Determinants of Health   Financial Resource Strain: Low Risk    Difficulty of Paying Living Expenses: Not hard at all  Food Insecurity: No Food Insecurity   Worried About Charity fundraiser in the Last Year: Never true   Arboriculturist in the Last Year: Never true  Transportation Needs: No Transportation Needs   Lack of Transportation (Medical): No   Lack of Transportation (Non-Medical): No  Physical Activity: Inactive   Days of Exercise per Week: 0 days   Minutes of Exercise per Session: 0 min  Stress: No Stress Concern Present   Feeling of Stress : Not at all  Social Connections: Socially Integrated   Frequency of Communication with Friends and Family: More than three  times a week   Frequency of Social Gatherings with Friends and Family: More than three times a week   Attends Religious Services: More than 4 times per year   Active Member of [employer] or Organizations: Yes   Attends Music therapist: More than 4 times per year   Marital Status: Married  Human resources officer Violence: Not At Risk   Fear of Current or Ex-Partner: No   Emotionally Abused: No   Physically Abused: No   Sexually Abused: No    Allergies  Allergen Reactions   Bee Venom Anaphylaxis and Hives   Lipitor [Atorvastatin] Other (See Comments)    alopecia    Current Outpatient Medications  Medication Sig Dispense Refill   apixaban (ELIQUIS) 5 MG TABS tablet Take 1 tablet (5 mg total) by mouth 2 (two) times daily. (Patient taking differently: Take 10 mg by mouth at bedtime.) 60 tablet 5   benzonatate (TESSALON) 100 MG capsule Take 1 capsule (100 mg total) by mouth 2 (two) times daily as needed for cough. (Patient taking differently: Take 100 mg by mouth at bedtime.) 180 capsule 0   BREO ELLIPTA 100-25 MCG/INH AEPB Inhale 1 puff into the lungs daily. (Patient not taking: Reported on 09/15/2021) 120 each 1   Cholecalciferol 125 MCG (5000 UT) capsule Take 1 capsule (5,000 Units total) by mouth daily. (Patient not taking: Reported on 09/15/2021) 90 capsule 1   EPINEPHrine (EPIPEN JR) 0.15 MG/0.3ML injection Inject 15 mg into the muscle as needed. (Patient not taking: Reported on 09/15/2021)  0   ergocalciferol (VITAMIN D2) 50000 units capsule Take 50,000 Units by mouth every Monday.     ezetimibe (ZETIA) 10 MG tablet Take 1 tablet (10 mg total) by mouth daily. (Patient not taking: Reported on 09/15/2021) 30 tablet 0   LORazepam (ATIVAN) 1 MG tablet TAKE 1 TABLET(1 MG) BY MOUTH EVERY 8 HOURS AS NEEDED FOR ANXIETY (Patient not taking: Reported on 09/19/2021) 270 tablet 0   rosuvastatin (CRESTOR) 10 MG tablet Take 1 tablet (10 mg total) by mouth at bedtime. (Patient not taking: Reported on  09/15/2021) 30 tablet 0   No current facility-administered medications for this visit.    REVIEW OF SYSTEMS:  [X]  denotes positive finding, [ ]  denotes negative finding Cardiac  Comments:  Chest pain or chest pressure:    Shortness of breath upon exertion:    Short of breath when lying flat:    Irregular heart rhythm:        Vascular    Pain in calf, thigh, or hip brought on by ambulation:    Pain in feet at night that wakes you up from your sleep:  Blood clot in your veins:    Leg swelling:         Pulmonary    Oxygen at home:    Productive cough:     Wheezing:         Neurologic    Sudden weakness in arms or legs:     Sudden numbness in arms or legs:     Sudden onset of difficulty speaking or slurred speech:    Temporary loss of vision in one eye:     Problems with dizziness:         Gastrointestinal    Blood in stool:     Vomited blood:         Genitourinary    Burning when urinating:     Blood in urine:        Psychiatric    Major depression:         Hematologic    Bleeding problems:    Problems with blood clotting too easily:        Skin    Rashes or ulcers:        Constitutional    Fever or chills:      PHYSICAL EXAM Vitals:   09/19/21 1306  BP: (!) 144/67  Resp: 20  Temp: 98.5 F (36.9 C)  SpO2: 97%  Weight: 85 lb (38.6 kg)  Height: 4\' 11"  (1.499 m)    Constitutional: Elderly appearing. No distress. Appears well nourished.  Neurologic: CN intact. no focal findings. no sensory loss. Psychiatric:  Mood and affect symmetric and appropriate. Eyes:  No icterus. No conjunctival pallor. Ears, nose, throat:  mucous membranes moist. Midline trachea.  Cardiac:  rate and rhythm.  Respiratory:  unlabored. Abdominal:  soft, non-tender, non-distended.  Peripheral vascular: absent left radial pulse. 1+ right radial pulse Extremity: no edema. no cyanosis. no pallor.  Skin: no gangrene. no ulceration.  Lymphatic: no Stemmer's sign. no palpable  lymphadenopathy.  PERTINENT LABORATORY AND RADIOLOGIC DATA  Most recent CBC CBC Latest Ref Rng & Units 09/15/2021 08/02/2021 02/29/2020  WBC 3.4 - 10.8 x10E3/uL 12.0(H) 9.4 10.8  Hemoglobin 11.1 - 15.9 g/dL 12.1 11.7(L) 12.2  Hematocrit 34.0 - 46.6 % 35.4 35.3(L) 37  Platelets 150 - 450 x10E3/uL 299 315 300     Most recent CMP CMP Latest Ref Rng & Units 09/16/2021 09/15/2021 08/02/2021  Glucose 70 - 99 mg/dL 87 79 87  BUN 8 - 23 mg/dL 23 23 18   Creatinine 0.44 - 1.00 mg/dL 1.12(H) 1.22(H) 1.11(H)  Sodium 135 - 145 mmol/L 136 137 129(L)  Potassium 3.5 - 5.1 mmol/L 3.9 4.8 4.2  Chloride 98 - 111 mmol/L 107 102 96(L)  CO2 22 - 32 mmol/L 23 22 24   Calcium 8.9 - 10.3 mg/dL 8.6(L) 9.5 9.3  Total Protein 6.5 - 8.1 g/dL - - 7.1  Total Bilirubin 0.3 - 1.2 mg/dL - - 0.4  Alkaline Phos 38 - 126 U/L - - 67  AST 15 - 41 U/L - - 19  ALT 0 - 44 U/L - - 8    Renal function Estimated Creatinine Clearance: 25.6 mL/min (A) (by C-G formula based on SCr of 1.12 mg/dL (H)).  Hemoglobin A1C (no units)  Date Value  02/29/2020 5.0   Hgb A1c MFr Bld (%)  Date Value  08/03/2021 5.2    LDL Calculated  Date Value Ref Range Status  11/12/2017 90 0 - 99 mg/dL Final   LDL Cholesterol  Date Value Ref Range Status  08/03/2021 168 (  H) 0 - 99 mg/dL Final    Comment:           Total Cholesterol/HDL:CHD Risk Coronary Heart Disease Risk Table                     Men   Women  1/2 Average Risk   3.4   3.3  Average Risk       5.0   4.4  2 X Average Risk   9.6   7.1  3 X Average Risk  23.4   11.0        Use the calculated Patient Ratio above and the CHD Risk Table to determine the patient's CHD Risk.        ATP III CLASSIFICATION (LDL):  <100     mg/dL   Optimal  100-129  mg/dL   Near or Above                    Optimal  130-159  mg/dL   Borderline  160-189  mg/dL   High  >190     mg/dL   Very High Performed at [facility] [address], [place], [place] [address]     CT angiogram  reveals left subclavian artery stenosis prior to takeoff of left vertebral artery.   Yevonne Aline. Stanford Breed, MD [facility] Phone Number: [phone] 09/19/2021 3:18 PM  Total time spent on preparing this encounter including chart review, data review, collecting history, examining the patient, coordinating care for this new patient, 60 minutes.  Portions of this report may have been transcribed using voice recognition software.  Every effort has been made to ensure accuracy; however, inadvertent computerized transcription errors may still be present.

## 2021-09-19 ENCOUNTER — Encounter: Payer: Self-pay | Admitting: Vascular Surgery

## 2021-09-19 ENCOUNTER — Ambulatory Visit: Payer: Medicare HMO | Admitting: Vascular Surgery

## 2021-09-19 ENCOUNTER — Other Ambulatory Visit: Payer: Self-pay

## 2021-09-19 VITALS — BP 144/67 | Temp 98.5°F | Resp 20 | Ht 59.0 in | Wt 85.0 lb

## 2021-09-19 DIAGNOSIS — I771 Stricture of artery: Secondary | ICD-10-CM

## 2021-09-20 ENCOUNTER — Other Ambulatory Visit: Payer: Self-pay | Admitting: Internal Medicine

## 2021-09-20 DIAGNOSIS — E785 Hyperlipidemia, unspecified: Secondary | ICD-10-CM

## 2021-09-20 DIAGNOSIS — I2510 Atherosclerotic heart disease of native coronary artery without angina pectoris: Secondary | ICD-10-CM

## 2021-09-25 ENCOUNTER — Ambulatory Visit: Payer: Medicare HMO | Admitting: Cardiology

## 2021-09-25 ENCOUNTER — Encounter: Payer: Self-pay | Admitting: Cardiology

## 2021-09-25 ENCOUNTER — Other Ambulatory Visit: Payer: Self-pay

## 2021-09-25 VITALS — BP 120/62 | HR 60 | Ht 59.0 in | Wt 84.8 lb

## 2021-09-25 DIAGNOSIS — E785 Hyperlipidemia, unspecified: Secondary | ICD-10-CM | POA: Diagnosis not present

## 2021-09-25 DIAGNOSIS — E7800 Pure hypercholesterolemia, unspecified: Secondary | ICD-10-CM

## 2021-09-25 DIAGNOSIS — I480 Paroxysmal atrial fibrillation: Secondary | ICD-10-CM

## 2021-09-25 DIAGNOSIS — Z720 Tobacco use: Secondary | ICD-10-CM | POA: Diagnosis not present

## 2021-09-25 DIAGNOSIS — I2510 Atherosclerotic heart disease of native coronary artery without angina pectoris: Secondary | ICD-10-CM | POA: Diagnosis not present

## 2021-09-25 MED ORDER — APIXABAN 5 MG PO TABS
5.0000 mg | ORAL_TABLET | Freq: Two times a day (BID) | ORAL | 3 refills | Status: DC
Start: 2021-09-25 — End: 2022-07-18

## 2021-09-25 MED ORDER — ROSUVASTATIN CALCIUM 10 MG PO TABS: ORAL_TABLET | ORAL | 3 refills | Status: DC

## 2021-09-25 MED ORDER — EZETIMIBE 10 MG PO TABS: 10.0000 mg | ORAL_TABLET | Freq: Every day | ORAL | 3 refills | Status: DC

## 2021-09-25 NOTE — Progress Notes (Signed)
HPI:FU cardiomyopathy and atrial fibrillation. Myoview in Nov 2013 demonstrated no ischemia and an EF of 81%. She had a low-level exercise Lexiscan study and did develop significant ST abnormalities consistent with ischemia and transient complete heart block with infusion. She has been treated medically with low threshold for cath if recurrent symptoms. Carotid Dopplers September 2015 showed less than 40% bilateral stenosis.  Had prolonged hospitalization July 2018 secondary to respiratory failure felt secondary to emphysema and aspiration pneumonia.  She had elevated enzymes with troponin of 4.39.  Ischemia evaluation recommended as outpatient. Continued medical therapy versus catheterization discussed with patient and she preferred conservative management. Nuclear study September 2019 showed ejection fraction 45% and no ischemia or infarction.  Carotid Doppler September 2019 showed 1 to 39% bilateral stenosis.  Echo cardiogram October 2022 showed normal LV function, grade 1 diastolic dysfunction, mild to moderate left atrial enlargement, mild to moderate mitral regurgitation, trace aortic insufficiency.  CTA October 2022 showed left subclavian stenosis.  Prior to that admission she had had difficulties with speech.  She was discharged with a monitor that showed paroxysmal atrial fibrillation.  Monitor also showed an episode of complete heart block that occurred in the setting of nausea and vomiting.  This was felt to be vagal in etiology.  Since she was last seen she denies dyspnea, chest pain, palpitations, syncope or bleeding.  She recently lost her husband.  Current Outpatient Medications  Medication Sig Dispense Refill   apixaban (ELIQUIS) 5 MG TABS tablet Take 1 tablet (5 mg total) by mouth 2 (two) times daily. 60 tablet 5   benzonatate (TESSALON) 100 MG capsule Take 1 capsule (100 mg total) by mouth 2 (two) times daily as needed for cough. (Patient taking differently: Take 100 mg by mouth at  bedtime.) 180 capsule 0   BREO ELLIPTA 100-25 MCG/INH AEPB Inhale 1 puff into the lungs daily. 120 each 1   EPINEPHrine (EPIPEN JR) 0.15 MG/0.3ML injection Inject 15 mg into the muscle as needed.  0   ergocalciferol (VITAMIN D2) 50000 units capsule Take 50,000 Units by mouth every Monday.     ezetimibe (ZETIA) 10 MG tablet Take 1 tablet (10 mg total) by mouth daily. 30 tablet 0   LORazepam (ATIVAN) 1 MG tablet TAKE 1 TABLET(1 MG) BY MOUTH EVERY 8 HOURS AS NEEDED FOR ANXIETY 270 tablet 0   rosuvastatin (CRESTOR) 10 MG tablet TAKE 1 TABLET(10 MG) BY MOUTH AT BEDTIME 90 tablet 0   No current facility-administered medications for this visit.     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 Echo 12/12: EF 50-55%; b. Echo 11/13:  mild LVH, EF 60-65%; c. 04/2017 Echo: EF 45-50%; d. 06/2018 Echo EF 55-65%; e. 07/2021 Echo: EF 60-65%, no rwma, GrI DD, mild-mod LAD. Mild-mod MR.   Acute on chronic respiratory failure (HCC)    Anxiety    Caloric malnutrition (HCC)    Carotid stenosis    Coronary artery disease    Fibromyalgia    HLD (hyperlipidemia)    Hx of cardiovascular stress test    a. Lex MV 11/13: + significant ECG changes and CHB with infusion; EF 81%, no ischemia; b. 06/2018 MV: EF 45% (55-60% visually). No ischemia/infarct-->Low risk.   Hypertension    Neuropathy    Osteoporosis    Stenosis of left subclavian artery ([place])    Syncope 08/2009   a. ETT-Echo 1/11:  poor ex  tol, submax exercise, no WMA or ECG changes c/w ischemia;     TIA (transient ischemic attack) 07/2021    Past Surgical History:  Procedure Laterality Date   None      Social History   Socioeconomic History   Marital status: Married    Spouse name: Not on file   Number of children: 2   Years of education: Not on file   Highest education level: Not on file  Occupational History   Not on file  Tobacco Use   Smoking status: Every Day    Packs/day:  0.50    Types: Cigarettes   Smokeless tobacco: Current  Vaping Use   Vaping Use: Never used  Substance and Sexual Activity   Alcohol use: Yes    Comment: glass wine once every 6 mos. per patient   Drug use: No   Sexual activity: Not on file  Other Topics Concern   Not on file  Social History Narrative   Lives with husband   Social Determinants of Health   Financial Resource Strain: Low Risk    Difficulty of Paying Living Expenses: Not hard at all  Food Insecurity: No Food Insecurity   Worried About Charity fundraiser in the Last Year: Never true   [place] in the Last Year: Never true  Transportation Needs: No Transportation Needs   Lack of Transportation (Medical): No   Lack of Transportation (Non-Medical): No  Physical Activity: Inactive   Days of Exercise per Week: 0 days   Minutes of Exercise per Session: 0 min  Stress: No Stress Concern Present   Feeling of Stress : Not at all  Social Connections: Socially Integrated   Frequency of Communication with Friends and Family: More than three times a week   Frequency of Social Gatherings with Friends and Family: More than three times a week   Attends Religious Services: More than 4 times per year   Active Member of [employer] or Organizations: Yes   Attends Music therapist: More than 4 times per year   Marital Status: Married  Human resources officer Violence: Not At Risk   Fear of Current or Ex-Partner: No   Emotionally Abused: No   Physically Abused: No   Sexually Abused: No    Family History  Problem Relation Age of Onset   CAD Mother 41       Status post PCI   Breast cancer Mother    Lung cancer Father    Colon cancer Neg Hx     ROS: no fevers or chills, productive cough, hemoptysis, dysphasia, odynophagia, melena, hematochezia, dysuria, hematuria, rash, seizure activity, orthopnea, PND, pedal edema, claudication. Remaining systems are negative.  Physical Exam: Well-developed well-nourished in no  acute distress.  Skin is warm and dry.  HEENT is normal.  Neck is supple.  Chest is clear to auscultation with normal expansion.  Cardiovascular exam is regular rate and rhythm.  Abdominal exam nontender or distended. No masses palpated. Extremities show no edema. neuro grossly intact   A/P  1 paroxysmal atrial fibrillation-she has had problems with bradycardia in the past and we will avoid AV nodal blocking agents.  Continue apixaban.  2 recent episode of complete heart block-this is felt secondary to vagal episode.  No history of syncope.  We will follow.  3 left subclavian stenosis-noted on previous CTA.  Needs follow-up with vascular surgery.  Continue statin.  4 hyperlipidemia-continue Zetia and Crestor.  5 tobacco abuse-patient counseled on discontinuing.  6 history of elevated troponin-follow-up nuclear study showed no ischemia and patient denies chest pain.  7 history of cardiomyopathy-LV function normal on most recent echocardiogram.  8 mild to moderate mitral regurgitation-she will need follow-up echoes in the future.  Kirk Ruths, MD

## 2021-09-25 NOTE — Patient Instructions (Signed)

## 2021-09-27 ENCOUNTER — Inpatient Hospital Stay: Payer: Medicare HMO | Admitting: Adult Health

## 2021-09-29 ENCOUNTER — Other Ambulatory Visit: Payer: Self-pay | Admitting: Internal Medicine

## 2021-09-29 DIAGNOSIS — F411 Generalized anxiety disorder: Secondary | ICD-10-CM

## 2021-09-29 DIAGNOSIS — J411 Mucopurulent chronic bronchitis: Secondary | ICD-10-CM

## 2021-09-29 MED ORDER — BENZONATATE 100 MG PO CAPS: 100.0000 mg | ORAL_CAPSULE | Freq: Two times a day (BID) | ORAL | 0 refills | Status: DC | PRN

## 2021-09-30 MED ORDER — LORAZEPAM 1 MG PO TABS: 1.0000 mg | ORAL_TABLET | Freq: Three times a day (TID) | ORAL | 1 refills | Status: DC | PRN

## 2021-10-02 ENCOUNTER — Other Ambulatory Visit: Payer: Self-pay | Admitting: Student

## 2021-10-02 DIAGNOSIS — I679 Cerebrovascular disease, unspecified: Secondary | ICD-10-CM

## 2021-10-02 DIAGNOSIS — G459 Transient cerebral ischemic attack, unspecified: Secondary | ICD-10-CM

## 2021-10-02 DIAGNOSIS — I4891 Unspecified atrial fibrillation: Secondary | ICD-10-CM

## 2021-10-18 ENCOUNTER — Telehealth: Payer: Self-pay | Admitting: Cardiology

## 2021-10-18 NOTE — Telephone Encounter (Signed)
Spoke to patient's daughter n law Lenna Sciara she stated patient has a cold with a non productive cough.She tested negative for covid.Advised ok to take over the counter cold med with no decongestant.Advised if not better to call PCP.

## 2021-10-18 NOTE — Telephone Encounter (Signed)
Alexandra House's daughter in law Alexandra House is calling stating Alexandra House currently has a cold and cough. She is wanting to know what over the counter medications is okay for her to take for this.

## 2021-12-15 ENCOUNTER — Ambulatory Visit: Payer: Medicare HMO | Admitting: Adult Health

## 2021-12-18 ENCOUNTER — Other Ambulatory Visit: Payer: Self-pay | Admitting: Internal Medicine

## 2021-12-18 DIAGNOSIS — I2510 Atherosclerotic heart disease of native coronary artery without angina pectoris: Secondary | ICD-10-CM

## 2021-12-18 DIAGNOSIS — E785 Hyperlipidemia, unspecified: Secondary | ICD-10-CM

## 2021-12-19 ENCOUNTER — Other Ambulatory Visit: Payer: Self-pay | Admitting: Internal Medicine

## 2021-12-19 DIAGNOSIS — J411 Mucopurulent chronic bronchitis: Secondary | ICD-10-CM

## 2022-02-19 ENCOUNTER — Other Ambulatory Visit: Payer: Self-pay | Admitting: Internal Medicine

## 2022-02-19 ENCOUNTER — Telehealth: Payer: Self-pay | Admitting: Internal Medicine

## 2022-02-19 DIAGNOSIS — J411 Mucopurulent chronic bronchitis: Secondary | ICD-10-CM

## 2022-02-19 DIAGNOSIS — F411 Generalized anxiety disorder: Secondary | ICD-10-CM

## 2022-02-19 MED ORDER — BENZONATATE 100 MG PO CAPS: ORAL_CAPSULE | ORAL | 0 refills | Status: DC

## 2022-02-19 MED ORDER — LORAZEPAM 1 MG PO TABS: 1.0000 mg | ORAL_TABLET | Freq: Three times a day (TID) | ORAL | 0 refills | Status: DC | PRN

## 2022-02-19 NOTE — Telephone Encounter (Signed)
1.Medication Requested: ?LORazepam (ATIVAN) 1 MG tablet ?benzonatate (TESSALON) 100 MG capsule ? ?2. Pharmacy (Name[facility]): ?[place], [place] Phone:  [phone]  ?Fax:  [phone]  ?  ? ?3. On Med List: yes  ? ?4. Last Visit with PCP: ? ?5. Next visit date with PCP: ? ? ?Agent: Please be advised that RX refills may take up to 3 business days. We ask that you follow-up with your pharmacy.  ?

## 2022-03-20 NOTE — Progress Notes (Signed)
HPI:FU cardiomyopathy and atrial fibrillation. Myoview in Nov 2013 demonstrated no ischemia and an EF of 81%. She had a low-level exercise Lexiscan study and did develop significant ST abnormalities consistent with ischemia and transient complete heart block with infusion. She has been treated medically with low threshold for cath if recurrent symptoms. Carotid Dopplers September 2015 showed less than 40% bilateral stenosis.  Had prolonged hospitalization July 2018 secondary to respiratory failure felt secondary to emphysema and aspiration pneumonia.  She had elevated enzymes with troponin of 4.39.  Ischemia evaluation recommended as outpatient. Continued medical therapy versus catheterization discussed with patient and she preferred conservative management. Nuclear study September 2019 showed ejection fraction 45% and no ischemia or infarction.  Carotid Doppler September 2019 showed 1 to 39% bilateral stenosis. Echocardiogram October 2022 showed normal LV function, grade 1 diastolic dysfunction, mild to moderate left atrial enlargement, mild to moderate mitral regurgitation, trace aortic insufficiency.  CTA October 2022 showed left subclavian stenosis.  Prior to that admission she had had difficulties with speech.  She was discharged with a monitor that showed paroxysmal atrial fibrillation.  Monitor also showed an episode of complete heart block that occurred in the setting of nausea and vomiting.  This was felt to be vagal in etiology.  Since she was last seen she denies dyspnea, chest pain, palpitations, syncope or bleeding.  Current Outpatient Medications  Medication Sig Dispense Refill   apixaban (ELIQUIS) 5 MG TABS tablet Take 1 tablet (5 mg total) by mouth 2 (two) times daily. 180 tablet 3   benzonatate (TESSALON) 100 MG capsule TAKE 1 CAPSULE(100 MG) BY MOUTH TWICE DAILY AS NEEDED FOR COUGH 180 capsule 0   BREO ELLIPTA 100-25 MCG/INH AEPB Inhale 1 puff into the lungs daily. 120 each 1    EPINEPHrine (EPIPEN JR) 0.15 MG/0.3ML injection Inject 15 mg into the muscle as needed.  0   ergocalciferol (VITAMIN D2) 50000 units capsule Take 50,000 Units by mouth every Monday.     ezetimibe (ZETIA) 10 MG tablet Take 1 tablet (10 mg total) by mouth daily. 90 tablet 3   LORazepam (ATIVAN) 1 MG tablet Take 1 tablet (1 mg total) by mouth every 8 (eight) hours as needed for anxiety. 270 tablet 0   rosuvastatin (CRESTOR) 10 MG tablet TAKE 1 TABLET(10 MG) BY MOUTH AT BEDTIME 90 tablet 0   No current facility-administered medications for this visit.     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 Echo 12/12: EF 50-55%; b. Echo 11/13:  mild LVH, EF 60-65%; c. 04/2017 Echo: EF 45-50%; d. 06/2018 Echo EF 55-65%; e. 07/2021 Echo: EF 60-65%, no rwma, GrI DD, mild-mod LAD. Mild-mod MR.   Acute on chronic respiratory failure (HCC)    Anxiety    Caloric malnutrition (HCC)    Carotid stenosis    Coronary artery disease    Fibromyalgia    HLD (hyperlipidemia)    Hx of cardiovascular stress test    a. Lex MV 11/13: + significant ECG changes and CHB with infusion; EF 81%, no ischemia; b. 06/2018 MV: EF 45% (55-60% visually). No ischemia/infarct-->Low risk.   Hypertension    Neuropathy    Osteoporosis    Stenosis of left subclavian artery ([place])    Syncope 08/2009   a. ETT-Echo 1/11:  poor ex tol, submax exercise, no WMA or ECG changes c/w ischemia;     TIA (transient ischemic attack) 07/2021  Past Surgical History:  Procedure Laterality Date   None      Social History   Socioeconomic History   Marital status: Married    Spouse name: Not on file   Number of children: 2   Years of education: Not on file   Highest education level: Not on file  Occupational History   Not on file  Tobacco Use   Smoking status: Every Day    Packs/day: 0.50    Types: Cigarettes   Smokeless tobacco: Current  Vaping Use   Vaping Use: Never  used  Substance and Sexual Activity   Alcohol use: Yes    Comment: glass wine once every 6 mos. per patient   Drug use: No   Sexual activity: Not on file  Other Topics Concern   Not on file  Social History Narrative   Lives with husband   Social Determinants of Health   Financial Resource Strain: Low Risk    Difficulty of Paying Living Expenses: Not hard at all  Food Insecurity: No Food Insecurity   Worried About Charity fundraiser in the Last Year: Never true   [place] in the Last Year: Never true  Transportation Needs: No Transportation Needs   Lack of Transportation (Medical): No   Lack of Transportation (Non-Medical): No  Physical Activity: Inactive   Days of Exercise per Week: 0 days   Minutes of Exercise per Session: 0 min  Stress: No Stress Concern Present   Feeling of Stress : Not at all  Social Connections: Socially Integrated   Frequency of Communication with Friends and Family: More than three times a week   Frequency of Social Gatherings with Friends and Family: More than three times a week   Attends Religious Services: More than 4 times per year   Active Member of [employer] or Organizations: Yes   Attends Music therapist: More than 4 times per year   Marital Status: Married  Human resources officer Violence: Not At Risk   Fear of Current or Ex-Partner: No   Emotionally Abused: No   Physically Abused: No   Sexually Abused: No    Family History  Problem Relation Age of Onset   CAD Mother 45       Status post PCI   Breast cancer Mother    Lung cancer Father    Colon cancer Neg Hx     ROS: no fevers or chills, productive cough, hemoptysis, dysphasia, odynophagia, melena, hematochezia, dysuria, hematuria, rash, seizure activity, orthopnea, PND, pedal edema, claudication. Remaining systems are negative.  Physical Exam: Well-developed well-nourished in no acute distress.  Skin is warm and dry.  HEENT is normal.  Neck is supple.  Right carotid  bruit Chest is clear to auscultation with normal expansion.  Cardiovascular exam is regular rate and rhythm.  Abdominal exam nontender or distended. No masses palpated. Extremities show no edema. neuro grossly intact  A/P  1 paroxysmal atrial fibrillation-patient is in sinus rhythm today.  We have avoided AV nodal blocking agents as she has had some bradycardia in the past.  Continue apixaban.  Check hemoglobin and renal function.  2 previous episode of complete heart block-felt to be vagal in etiology.  She does not have any history of syncope and has had no symptoms since last office visit.  Continue to follow.  3 hyperlipidemia-continue Crestor and Zetia.  Check lipids and liver.  4 left subclavian stenosis-noted on previous CTA.  Continue statin.  Previously follow-up vascular  surgery.  5 history of elevated troponin-occurring in the setting of aspiration pneumonia.  Follow-up nuclear study showed no ischemia.  6 history of cardiomyopathy-LV function has improved on most recent echocardiogram.  7 history of mild to moderate mitral regurgitation-she will require follow-up echoes in the future.  8 tobacco abuse-patient counseled again on discontinuing.  9 right carotid bruit-we will plan carotid Dopplers to reassess.  Kirk Ruths, MD

## 2022-03-23 ENCOUNTER — Encounter: Payer: Self-pay | Admitting: Cardiology

## 2022-03-23 ENCOUNTER — Ambulatory Visit (INDEPENDENT_AMBULATORY_CARE_PROVIDER_SITE_OTHER): Payer: Medicare HMO | Admitting: Cardiology

## 2022-03-23 VITALS — BP 147/81 | HR 77 | Ht 59.0 in | Wt 89.0 lb

## 2022-03-23 DIAGNOSIS — E7800 Pure hypercholesterolemia, unspecified: Secondary | ICD-10-CM

## 2022-03-23 DIAGNOSIS — I2510 Atherosclerotic heart disease of native coronary artery without angina pectoris: Secondary | ICD-10-CM | POA: Diagnosis not present

## 2022-03-23 DIAGNOSIS — I480 Paroxysmal atrial fibrillation: Secondary | ICD-10-CM | POA: Diagnosis not present

## 2022-03-23 DIAGNOSIS — Z720 Tobacco use: Secondary | ICD-10-CM | POA: Diagnosis not present

## 2022-03-23 DIAGNOSIS — I5032 Chronic diastolic (congestive) heart failure: Secondary | ICD-10-CM

## 2022-03-23 DIAGNOSIS — R0989 Other specified symptoms and signs involving the circulatory and respiratory systems: Secondary | ICD-10-CM | POA: Diagnosis not present

## 2022-03-23 NOTE — Patient Instructions (Signed)
  Testing/Procedures:  Your physician has requested that you have a carotid duplex. This test is an ultrasound of the carotid arteries in your neck. It looks at blood flow through these arteries that supply the brain with blood. Allow one hour for this exam. There are no restrictions or special instructions. [facility]   Follow-Up: At [facility], you and your health needs are our priority.  As part of our continuing mission to provide you with exceptional heart care, we have created designated Provider Care Teams.  These Care Teams include your primary Cardiologist (physician) and Advanced Practice Providers (APPs -  Physician Assistants and Nurse Practitioners) who all work together to provide you with the care you need, when you need it.  We recommend signing up for the patient portal called "MyChart".  Sign up information is provided on this After Visit Summary.  MyChart is used to connect with patients for Virtual Visits (Telemedicine).  Patients are able to view lab/test results, encounter notes, upcoming appointments, etc.  Non-urgent messages can be sent to your provider as well.   To learn more about what you can do with MyChart, go to [email or web address].    Your next appointment:   12 month(s)  The format for your next appointment:   In Person  Provider:   Kirk Ruths, MD     CALL [phone] FOR PRIMARY CARE  Important Information About Sugar

## 2022-03-24 LAB — COMPREHENSIVE METABOLIC PANEL
ALT: 13 IU/L (ref 0–32)
AST: 20 IU/L (ref 0–40)
Albumin/Globulin Ratio: 1.3 (ref 1.2–2.2)
Albumin: 4.2 g/dL (ref 3.7–4.7)
Alkaline Phosphatase: 77 IU/L (ref 44–121)
BUN/Creatinine Ratio: 16 (ref 12–28)
BUN: 18 mg/dL (ref 8–27)
Bilirubin Total: 0.2 mg/dL (ref 0.0–1.2)
CO2: 19 mmol/L — ABNORMAL LOW (ref 20–29)
Calcium: 9.6 mg/dL (ref 8.7–10.3)
Chloride: 103 mmol/L (ref 96–106)
Creatinine, Ser: 1.16 mg/dL — ABNORMAL HIGH (ref 0.57–1.00)
Globulin, Total: 3.2 g/dL (ref 1.5–4.5)
Glucose: 95 mg/dL (ref 70–99)
Potassium: 5.2 mmol/L (ref 3.5–5.2)
Sodium: 139 mmol/L (ref 134–144)
Total Protein: 7.4 g/dL (ref 6.0–8.5)
eGFR: 48 mL/min/{1.73_m2} — ABNORMAL LOW (ref >59)

## 2022-03-24 LAB — CBC
Hematocrit: 34.8 % (ref 34.0–46.6)
Hemoglobin: 11.7 g/dL (ref 11.1–15.9)
MCH: 29.1 pg (ref 26.6–33.0)
MCHC: 33.6 g/dL (ref 31.5–35.7)
MCV: 87 fL (ref 79–97)
Platelets: 317 10*3/uL (ref 150–450)
RBC: 4.02 x10E6/uL (ref 3.77–5.28)
RDW: 12.6 % (ref 11.7–15.4)
WBC: 11 10*3/uL — ABNORMAL HIGH (ref 3.4–10.8)

## 2022-03-24 LAB — LIPID PANEL
Chol/HDL Ratio: 2.3 ratio (ref 0.0–4.4)
Cholesterol, Total: 138 mg/dL (ref 100–199)
HDL: 60 mg/dL (ref >39)
LDL Chol Calc (NIH): 60 mg/dL (ref 0–99)
Triglycerides: 95 mg/dL (ref 0–149)
VLDL Cholesterol Cal: 18 mg/dL (ref 5–40)

## 2022-03-26 ENCOUNTER — Ambulatory Visit (INDEPENDENT_AMBULATORY_CARE_PROVIDER_SITE_OTHER): Payer: Medicare HMO | Admitting: Internal Medicine

## 2022-03-26 ENCOUNTER — Encounter: Payer: Self-pay | Admitting: Internal Medicine

## 2022-03-26 VITALS — BP 140/82 | HR 67 | Temp 98.1°F | Ht 59.0 in | Wt 84.0 lb

## 2022-03-26 DIAGNOSIS — I739 Peripheral vascular disease, unspecified: Secondary | ICD-10-CM | POA: Diagnosis not present

## 2022-03-26 DIAGNOSIS — T63481A Toxic effect of venom of other arthropod, accidental (unintentional), initial encounter: Secondary | ICD-10-CM | POA: Diagnosis not present

## 2022-03-26 DIAGNOSIS — Z0001 Encounter for general adult medical examination with abnormal findings: Secondary | ICD-10-CM | POA: Diagnosis not present

## 2022-03-26 DIAGNOSIS — I2510 Atherosclerotic heart disease of native coronary artery without angina pectoris: Secondary | ICD-10-CM | POA: Diagnosis not present

## 2022-03-26 DIAGNOSIS — J411 Mucopurulent chronic bronchitis: Secondary | ICD-10-CM | POA: Diagnosis not present

## 2022-03-26 DIAGNOSIS — Z23 Encounter for immunization: Secondary | ICD-10-CM

## 2022-03-26 DIAGNOSIS — E785 Hyperlipidemia, unspecified: Secondary | ICD-10-CM

## 2022-03-26 MED ORDER — BOOSTRIX 5-2.5-18.5 LF-MCG/0.5 IM SUSP: 0.5000 mL | Freq: Once | INTRAMUSCULAR | 0 refills | Status: AC

## 2022-03-26 MED ORDER — TRELEGY ELLIPTA 100-62.5-25 MCG/ACT IN AEPB: 1.0000 | INHALATION_SPRAY | Freq: Every day | RESPIRATORY_TRACT | 1 refills | Status: DC

## 2022-03-26 MED ORDER — ROSUVASTATIN CALCIUM 10 MG PO TABS: 10.0000 mg | ORAL_TABLET | Freq: Every day | ORAL | 1 refills | Status: DC

## 2022-03-26 MED ORDER — SHINGRIX 50 MCG/0.5ML IM SUSR: 0.5000 mL | Freq: Once | INTRAMUSCULAR | 1 refills | Status: AC

## 2022-03-26 MED ORDER — EPINEPHRINE 0.15 MG/0.3ML IJ SOAJ: 15.0000 mg | INTRAMUSCULAR | 3 refills | Status: AC | PRN

## 2022-03-26 NOTE — Patient Instructions (Signed)

## 2022-03-26 NOTE — Progress Notes (Addendum)
Subjective:  Patient ID: Alexandra House, female    DOB: Oct 26, 1945  Age: 76 y.o. MRN: [id number]  CC: COPD and Annual Exam   HPI [name] presents for a CPX and f/up -   She complains of intermittent cough productive of clear to yellow phlegm. She is not using an inhaler. She complains that her feet feel cold and painful.  Outpatient Medications Prior to Visit  Medication Sig Dispense Refill   apixaban (ELIQUIS) 5 MG TABS tablet Take 1 tablet (5 mg total) by mouth 2 (two) times daily. 180 tablet 3   benzonatate (TESSALON) 100 MG capsule TAKE 1 CAPSULE(100 MG) BY MOUTH TWICE DAILY AS NEEDED FOR COUGH 180 capsule 0   ergocalciferol (VITAMIN D2) 50000 units capsule Take 50,000 Units by mouth every Monday.     ezetimibe (ZETIA) 10 MG tablet Take 1 tablet (10 mg total) by mouth daily. 90 tablet 3   LORazepam (ATIVAN) 1 MG tablet Take 1 tablet (1 mg total) by mouth every 8 (eight) hours as needed for anxiety. 270 tablet 0   BREO ELLIPTA 100-25 MCG/INH AEPB Inhale 1 puff into the lungs daily. 120 each 1   EPINEPHrine (EPIPEN JR) 0.15 MG/0.3ML injection Inject 15 mg into the muscle as needed.  0   rosuvastatin (CRESTOR) 10 MG tablet TAKE 1 TABLET(10 MG) BY MOUTH AT BEDTIME 90 tablet 0   No facility-administered medications prior to visit.    ROS Review of Systems  Constitutional: Negative.  Negative for chills, diaphoresis, fatigue and fever.  HENT: Negative.    Eyes: Negative.  Negative for discharge.  Respiratory:  Positive for cough. Negative for chest tightness, shortness of breath and wheezing.   Cardiovascular:  Negative for chest pain, palpitations and leg swelling.  Gastrointestinal:  Negative for abdominal pain, constipation, diarrhea, nausea and vomiting.  Endocrine: Negative.   Genitourinary: Negative.  Negative for difficulty urinating.  Musculoskeletal: Negative.  Negative for arthralgias and myalgias.  Skin: Negative.   Neurological: Negative.  Negative for  weakness.  Hematological:  Negative for adenopathy. Does not bruise/bleed easily.  Psychiatric/Behavioral:  Positive for sleep disturbance. Negative for confusion, decreased concentration, dysphoric mood, self-injury and suicidal ideas. The patient is nervous/anxious. The patient is not hyperactive.     Objective:  BP 140/82 (BP Location: Right Arm, Patient Position: Sitting, Cuff Size: Normal)   Pulse 67   Temp 98.1 F (36.7 C) (Oral)   Ht '4\' 11"'$  (1.499 m)   Wt 84 lb (38.1 kg)   LMP  (LMP Unknown)   SpO2 96%   BMI 16.97 kg/m   BP Readings from Last 3 Encounters:  03/26/22 140/82  03/23/22 (!) 147/81  09/25/21 120/62    Wt Readings from Last 3 Encounters:  03/26/22 84 lb (38.1 kg)  03/23/22 89 lb (40.4 kg)  09/25/21 84 lb 12.8 oz (38.5 kg)    Physical Exam Vitals reviewed.  HENT:     Nose: Nose normal.     Mouth/Throat:     Mouth: Mucous membranes are moist.  Eyes:     General: No scleral icterus.    Pupils: Pupils are equal, round, and reactive to light.  Cardiovascular:     Rate and Rhythm: Normal rate and regular rhythm.     Pulses:          Dorsalis pedis pulses are 1+ on the right side and 1+ on the left side.       Posterior tibial pulses are 0 on the right  side and 0 on the left side.     Heart sounds: No murmur heard. Pulmonary:     Effort: Pulmonary effort is normal.     Breath sounds: No stridor. No decreased breath sounds, wheezing, rhonchi or rales.  Abdominal:     General: Abdomen is flat.     Palpations: There is no mass.     Tenderness: There is no abdominal tenderness. There is no guarding.     Hernia: No hernia is present.  Musculoskeletal:        General: Normal range of motion.     Cervical back: Neck supple.     Right lower leg: No edema.     Left lower leg: No edema.  Feet:     Right foot:     Skin integrity: Skin integrity normal.     Toenail Condition: Right toenails are normal.     Left foot:     Skin integrity: Skin integrity  normal.     Toenail Condition: Left toenails are normal.  Lymphadenopathy:     Cervical: No cervical adenopathy.  Skin:    General: Skin is warm and dry.  Neurological:     General: No focal deficit present.     Mental Status: She is alert.  Psychiatric:        Mood and Affect: Mood normal.        Behavior: Behavior normal.     Lab Results  Component Value Date   WBC 11.0 (H) 03/23/2022   HGB 11.7 03/23/2022   HCT 34.8 03/23/2022   PLT 317 03/23/2022   GLUCOSE 95 03/23/2022   CHOL 138 03/23/2022   TRIG 95 03/23/2022   HDL 60 03/23/2022   LDLCALC 60 03/23/2022   ALT 13 03/23/2022   AST 20 03/23/2022   NA 139 03/23/2022   K 5.2 03/23/2022   CL 103 03/23/2022   CREATININE 1.16 (H) 03/23/2022   BUN 18 03/23/2022   CO2 19 (L) 03/23/2022   TSH 3.39 02/29/2020   INR 0.9 08/02/2021   HGBA1C 5.2 08/03/2021    No results found.  Assessment & Plan:   Daleen was seen today for copd and annual exam.  Diagnoses and all orders for this visit:  Encounter for general adult medical examination with abnormal findings- Exam completed, recent labs reviewed, vaccines reviewed and updated, no cancer screenings indicated, patient education material was given.  Mucopurulent chronic bronchitis ([place])- I recommended that she use a LABA/LAMA/ICS inhaler. -     Fluticasone-Umeclidin-Vilant (TRELEGY ELLIPTA) 100-62.5-25 MCG/ACT AEPB; Inhale 1 puff into the lungs daily.  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  Claudication, intermittent (HCC) -     VAS [place] ABI WITH/WO TBI; Future  Hyperlipidemia with target LDL less than 130- LDL goal achieved. Doing well on the statin  -     rosuvastatin (CRESTOR) 10 MG tablet; Take 1 tablet (10 mg total) by mouth daily.  Coronary artery disease involving native heart without angina pectoris, unspecified vessel or lesion type -     rosuvastatin (CRESTOR) 10 MG  tablet; Take 1 tablet (10 mg total) by mouth daily.  Need for prophylactic vaccination with combined diphtheria-tetanus-pertussis (DTP) vaccine -     Tdap (BOOSTRIX) 5-2.5-18.5 LF-MCG/0.5 injection; Inject 0.5 mLs into the muscle once for 1 dose.  Need for varicella vaccine -     Zoster Vaccine Adjuvanted [facility]) injection; Inject  0.5 mLs into the muscle once for 1 dose.  Other orders -     Pneumococcal polysaccharide vaccine 23-valent greater than or equal to 2yo subcutaneous/IM   I have discontinued Enid Derry A. Lye's Breo Ellipta. I have also changed her rosuvastatin. Additionally, I am having her start on Trelegy Ellipta, Boostrix, and Shingrix. Lastly, I am having her maintain her ergocalciferol, apixaban, ezetimibe, LORazepam, benzonatate, and EPINEPHrine.  Meds ordered this encounter  Medications   Fluticasone-Umeclidin-Vilant (TRELEGY ELLIPTA) 100-62.5-25 MCG/ACT AEPB    Sig: Inhale 1 puff into the lungs daily.    Dispense:  1120 each    Refill:  1   EPINEPHrine (EPIPEN JR) 0.15 MG/0.3ML injection    Sig: Inject 15 mg into the muscle as needed.    Dispense:  2 each    Refill:  3   rosuvastatin (CRESTOR) 10 MG tablet    Sig: Take 1 tablet (10 mg total) by mouth daily.    Dispense:  100 tablet    Refill:  1   Tdap (BOOSTRIX) 5-2.5-18.5 LF-MCG/0.5 injection    Sig: Inject 0.5 mLs into the muscle once for 1 dose.    Dispense:  0.5 mL    Refill:  0   Zoster Vaccine Adjuvanted [facility]) injection    Sig: Inject 0.5 mLs into the muscle once for 1 dose.    Dispense:  0.5 mL    Refill:  1     Follow-up: Return in about 6 months (around 09/25/2022).  Scarlette Calico, MD

## 2022-03-28 ENCOUNTER — Encounter: Payer: Self-pay | Admitting: *Deleted

## 2022-03-29 ENCOUNTER — Other Ambulatory Visit: Payer: Self-pay | Admitting: Internal Medicine

## 2022-03-29 DIAGNOSIS — F411 Generalized anxiety disorder: Secondary | ICD-10-CM

## 2022-04-04 ENCOUNTER — Ambulatory Visit (HOSPITAL_COMMUNITY)
Admission: RE | Admit: 2022-04-04 | Discharge: 2022-04-04 | Disposition: A | Discharge status: Home / Self Care | Payer: Medicare HMO | Source: Ambulatory Visit | Attending: Cardiovascular Disease | Admitting: Cardiovascular Disease

## 2022-04-04 ENCOUNTER — Ambulatory Visit (HOSPITAL_BASED_OUTPATIENT_CLINIC_OR_DEPARTMENT_OTHER)
Admission: RE | Admit: 2022-04-04 | Discharge: 2022-04-04 | Disposition: A | Discharge status: Home / Self Care | Payer: Medicare HMO | Source: Ambulatory Visit | Attending: Internal Medicine | Admitting: Internal Medicine

## 2022-04-04 DIAGNOSIS — I739 Peripheral vascular disease, unspecified: Secondary | ICD-10-CM | POA: Insufficient documentation

## 2022-04-04 DIAGNOSIS — R0989 Other specified symptoms and signs involving the circulatory and respiratory systems: Secondary | ICD-10-CM | POA: Insufficient documentation

## 2022-04-05 ENCOUNTER — Other Ambulatory Visit: Payer: Self-pay | Admitting: Internal Medicine

## 2022-04-09 ENCOUNTER — Encounter (HOSPITAL_COMMUNITY): Payer: Medicare HMO

## 2022-04-09 ENCOUNTER — Other Ambulatory Visit: Payer: Self-pay | Admitting: Internal Medicine

## 2022-04-09 ENCOUNTER — Encounter: Payer: Self-pay | Admitting: *Deleted

## 2022-04-23 ENCOUNTER — Telehealth: Payer: Self-pay | Admitting: Internal Medicine

## 2022-04-23 NOTE — Telephone Encounter (Signed)
LVM for pt to rtn my call to schedule AWV with [facility] call back # [phone] 

## 2022-05-01 ENCOUNTER — Other Ambulatory Visit (HOSPITAL_COMMUNITY): Payer: Self-pay | Admitting: Cardiology

## 2022-05-01 DIAGNOSIS — I779 Disorder of arteries and arterioles, unspecified: Secondary | ICD-10-CM

## 2022-05-07 ENCOUNTER — Ambulatory Visit (INDEPENDENT_AMBULATORY_CARE_PROVIDER_SITE_OTHER): Payer: Medicare HMO

## 2022-05-07 DIAGNOSIS — Z0000 Encounter for general adult medical examination without abnormal findings: Secondary | ICD-10-CM | POA: Diagnosis not present

## 2022-05-07 NOTE — Patient Instructions (Signed)
Ms. Alexandra House , Thank you for taking time to come for your Medicare Wellness Visit. I appreciate your ongoing commitment to your health goals. Please review the following plan we discussed and let me know if I can assist you in the future.   Screening recommendations/referrals: Colonoscopy: no longer required  Mammogram: no longer required  Bone Density: declined to discuss with PCP  Recommended yearly ophthalmology/optometry visit for glaucoma screening and checkup Recommended yearly dental visit for hygiene and checkup  Vaccinations: Influenza vaccine: declined  Pneumococcal vaccine: due  Tdap vaccine: due  Shingles vaccine: will consider     Advanced directives: yes   Conditions/risks identified: none   Next appointment: none    Preventive Care 46 Years and Older, Female Preventive care refers to lifestyle choices and visits with your health care provider that can promote health and wellness. What does preventive care include? A yearly physical exam. This is also called an annual well check. Dental exams once or twice a year. Routine eye exams. Ask your health care provider how often you should have your eyes checked. Personal lifestyle choices, including: Daily care of your teeth and gums. Regular physical activity. Eating a healthy diet. Avoiding tobacco and drug use. Limiting alcohol use. Practicing safe sex. Taking low-dose aspirin every day. Taking vitamin and mineral supplements as recommended by your health care provider. What happens during an annual well check? The services and screenings done by your health care provider during your annual well check will depend on your age, overall health, lifestyle risk factors, and family history of disease. Counseling  Your health care provider may ask you questions about your: Alcohol use. Tobacco use. Drug use. Emotional well-being. Home and relationship well-being. Sexual activity. Eating habits. History of  falls. Memory and ability to understand (cognition). Work and work Statistician. Reproductive health. Screening  You may have the following tests or measurements: Height, weight, and BMI. Blood pressure. Lipid and cholesterol levels. These may be checked every 5 years, or more frequently if you are over 33 years old. Skin check. Lung cancer screening. You may have this screening every year starting at age 89 if you have a 30-pack-year history of smoking and currently smoke or have quit within the past 15 years. Fecal occult blood test (FOBT) of the stool. You may have this test every year starting at age 69. Flexible sigmoidoscopy or colonoscopy. You may have a sigmoidoscopy every 5 years or a colonoscopy every 10 years starting at age 20. Hepatitis C blood test. Hepatitis B blood test. Sexually transmitted disease (STD) testing. Diabetes screening. This is done by checking your blood sugar (glucose) after you have not eaten for a while (fasting). You may have this done every 1-3 years. Bone density scan. This is done to screen for osteoporosis. You may have this done starting at age 85. Mammogram. This may be done every 1-2 years. Talk to your health care provider about how often you should have regular mammograms. Talk with your health care provider about your test results, treatment options, and if necessary, the need for more tests. Vaccines  Your health care provider may recommend certain vaccines, such as: Influenza vaccine. This is recommended every year. Tetanus, diphtheria, and acellular pertussis (Tdap, Td) vaccine. You may need a Td booster every 10 years. Zoster vaccine. You may need this after age 19. Pneumococcal 13-valent conjugate (PCV13) vaccine. One dose is recommended after age 39. Pneumococcal polysaccharide (PPSV23) vaccine. One dose is recommended after age 38. Talk to your health  care provider about which screenings and vaccines you need and how often you need  them. This information is not intended to replace advice given to you by your health care provider. Make sure you discuss any questions you have with your health care provider. Document Released: 10/28/2015 Document Revised: 06/20/2016 Document Reviewed: 08/02/2015 Elsevier Interactive Patient Education  2017 [employer] Prevention in the Home Falls can cause injuries. They can happen to people of all ages. There are many things you can do to make your home safe and to help prevent falls. What can I do on the outside of my home? Regularly fix the edges of walkways and driveways and fix any cracks. Remove anything that might make you trip as you walk through a door, such as a raised step or threshold. Trim any bushes or trees on the path to your home. Use bright outdoor lighting. Clear any walking paths of anything that might make someone trip, such as rocks or tools. Regularly check to see if handrails are loose or broken. Make sure that both sides of any steps have handrails. Any raised decks and porches should have guardrails on the edges. Have any leaves, snow, or ice cleared regularly. Use sand or salt on walking paths during winter. Clean up any spills in your garage right away. This includes oil or grease spills. What can I do in the bathroom? Use night lights. Install grab bars by the toilet and in the tub and shower. Do not use towel bars as grab bars. Use non-skid mats or decals in the tub or shower. If you need to sit down in the shower, use a plastic, non-slip stool. Keep the floor dry. Clean up any water that spills on the floor as soon as it happens. Remove soap buildup in the tub or shower regularly. Attach bath mats securely with double-sided non-slip rug tape. Do not have throw rugs and other things on the floor that can make you trip. What can I do in the bedroom? Use night lights. Make sure that you have a light by your bed that is easy to reach. Do not use  any sheets or blankets that are too big for your bed. They should not hang down onto the floor. Have a firm chair that has side arms. You can use this for support while you get dressed. Do not have throw rugs and other things on the floor that can make you trip. What can I do in the kitchen? Clean up any spills right away. Avoid walking on wet floors. Keep items that you use a lot in easy-to-reach places. If you need to reach something above you, use a strong step stool that has a grab bar. Keep electrical cords out of the way. Do not use floor polish or wax that makes floors slippery. If you must use wax, use non-skid floor wax. Do not have throw rugs and other things on the floor that can make you trip. What can I do with my stairs? Do not leave any items on the stairs. Make sure that there are handrails on both sides of the stairs and use them. Fix handrails that are broken or loose. Make sure that handrails are as long as the stairways. Check any carpeting to make sure that it is firmly attached to the stairs. Fix any carpet that is loose or worn. Avoid having throw rugs at the top or bottom of the stairs. If you do have throw rugs, attach them to  the floor with carpet tape. Make sure that you have a light switch at the top of the stairs and the bottom of the stairs. If you do not have them, ask someone to add them for you. What else can I do to help prevent falls? Wear shoes that: Do not have high heels. Have rubber bottoms. Are comfortable and fit you well. Are closed at the toe. Do not wear sandals. If you use a stepladder: Make sure that it is fully opened. Do not climb a closed stepladder. Make sure that both sides of the stepladder are locked into place. Ask someone to hold it for you, if possible. Clearly mark and make sure that you can see: Any grab bars or handrails. First and last steps. Where the edge of each step is. Use tools that help you move around (mobility aids)  if they are needed. These include: Canes. Walkers. Scooters. Crutches. Turn on the lights when you go into a dark area. Replace any light bulbs as soon as they burn out. Set up your furniture so you have a clear path. Avoid moving your furniture around. If any of your floors are uneven, fix them. If there are any pets around you, be aware of where they are. Review your medicines with your doctor. Some medicines can make you feel dizzy. This can increase your chance of falling. Ask your doctor what other things that you can do to help prevent falls. This information is not intended to replace advice given to you by your health care provider. Make sure you discuss any questions you have with your health care provider. Document Released: 07/28/2009 Document Revised: 03/08/2016 Document Reviewed: 11/05/2014 Elsevier Interactive Patient Education  2017 [employer].

## 2022-05-07 NOTE — Progress Notes (Signed)
Subjective:   Alexandra House is a 76 y.o. female who presents for Medicare Annual (Subsequent) preventive examination.   I connected with Alexandra House  today by telephone and verified that I am speaking with the correct person using two identifiers. Location patient: home Location provider: work Persons participating in the virtual visit: patient, provider.   I discussed the limitations, risks, security and privacy concerns of performing an evaluation and management service by telephone and the availability of in person appointments. I also discussed with the patient that there may be a patient responsible charge related to this service. The patient expressed understanding and verbally consented to this telephonic visit.    Interactive audio and video telecommunications were attempted between this provider and patient, however failed, due to patient having technical difficulties OR patient did not have access to video capability.  We continued and completed visit with audio only.    Review of Systems     Cardiac Risk Factors include: advanced age (>41mn, >>36women);dyslipidemia     Objective:    Today's Vitals   There is no height or weight on file to calculate BMI.     05/07/2022    1:08 PM 09/15/2021   10:00 PM 08/02/2021   11:51 AM 04/27/2021   11:15 AM 08/27/2018   11:29 AM 04/20/2017    3:37 PM 04/20/2017   10:35 AM  Advanced Directives  Does Patient Have a Medical Advance Directive? Yes No No Yes No No No  Type of AParamedicof AVassarLiving will   Living will     Does patient want to make changes to medical advance directive?    No - Patient declined     Copy of HCorydonin Chart? No - copy requested        Would patient like information on creating a medical advance directive?  No - Patient declined No - Patient declined   No - Patient declined No - Patient declined    Current Medications (verified) Outpatient Encounter  Medications as of 05/07/2022  Medication Sig   apixaban (ELIQUIS) 5 MG TABS tablet Take 1 tablet (5 mg total) by mouth 2 (two) times daily.   benzonatate (TESSALON) 100 MG capsule TAKE 1 CAPSULE(100 MG) BY MOUTH TWICE DAILY AS NEEDED FOR COUGH   EPINEPHrine (EPIPEN JR) 0.15 MG/0.3ML injection Inject 15 mg into the muscle as needed.   ergocalciferol (VITAMIN D2) 50000 units capsule Take 50,000 Units by mouth every Monday.   ezetimibe (ZETIA) 10 MG tablet Take 1 tablet (10 mg total) by mouth daily.   Fluticasone-Umeclidin-Vilant (TRELEGY ELLIPTA) 100-62.5-25 MCG/ACT AEPB Inhale 1 puff into the lungs daily.   LORazepam (ATIVAN) 1 MG tablet TAKE 1 TABLET(1 MG) BY MOUTH EVERY 8 HOURS AS NEEDED FOR ANXIETY   rosuvastatin (CRESTOR) 10 MG tablet Take 1 tablet (10 mg total) by mouth daily.   No facility-administered encounter medications on file as of 05/07/2022.    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 Echo 12/12: EF 50-55%; b. Echo 11/13:  mild LVH, EF 60-65%; c. 04/2017 Echo: EF 45-50%; d. 06/2018 Echo EF 55-65%; e. 07/2021 Echo: EF 60-65%, no rwma, GrI DD, mild-mod LAD. Mild-mod MR.   Acute on chronic respiratory failure (HCC)    Anxiety    Caloric malnutrition (HCC)    Carotid stenosis  Coronary artery disease    Fibromyalgia    HLD (hyperlipidemia)    Hx of cardiovascular stress test    a. Lex MV 11/13: + significant ECG changes and CHB with infusion; EF 81%, no ischemia; b. 06/2018 MV: EF 45% (55-60% visually). No ischemia/infarct-->Low risk.   Hypertension    Neuropathy    Osteoporosis    Stenosis of left subclavian artery ([place])    Syncope 08/2009   a. ETT-Echo 1/11:  poor ex tol, submax exercise, no WMA or ECG changes c/w ischemia;     TIA (transient ischemic attack) 07/2021   Past Surgical History:  Procedure Laterality Date   None      Family History  Problem Relation Age of Onset   CAD Mother 35       Status post PCI   Breast cancer Mother    Lung cancer Father    Colon cancer Neg Hx    Social History   Socioeconomic History   Marital status: Married    Spouse name: Not on file   Number of children: 2   Years of education: Not on file   Highest education level: Not on file  Occupational History   Not on file  Tobacco Use   Smoking status: Every Day    Packs/day: 0.50    Types: Cigarettes   Smokeless tobacco: Current  Vaping Use   Vaping Use: Never used  Substance and Sexual Activity   Alcohol use: Yes    Comment: glass wine once every 6 mos. per patient   Drug use: No   Sexual activity: Not on file  Other Topics Concern   Not on file  Social History Narrative   Lives with husband   Social Determinants of Health   Financial Resource Strain: Low Risk  (05/07/2022)   Overall Financial Resource Strain (CARDIA)    Difficulty of Paying Living Expenses: Not hard at all  Food Insecurity: No Food Insecurity (05/07/2022)   Hunger Vital Sign    Worried About Running Out of Food in the Last Year: Never true    Ran Out of Food in the Last Year: Never true  Transportation Needs: No Transportation Needs (05/07/2022)   PRAPARE - Hydrologist (Medical): No    Lack of Transportation (Non-Medical): No  Physical Activity: Inactive (05/07/2022)   Exercise Vital Sign    Days of Exercise per Week: 0 days    Minutes of Exercise per Session: 0 min  Stress: No Stress Concern Present (05/07/2022)   [place]    Feeling of Stress : Not at all  Social Connections: Moderately Isolated (05/07/2022)   Social Connection and Isolation Panel [NHANES]    Frequency of Communication with Friends and Family: Three times a week    Frequency of Social Gatherings with Friends and Family: Three times a week    Attends Religious Services:  More than 4 times per year    Active Member of Clubs or Organizations: No    Attends Archivist Meetings: Never    Marital Status: Widowed    Tobacco Counseling Ready to quit: Not Answered Counseling given: Not Answered   Clinical Intake:  Pre-visit preparation completed: Yes  Pain : No/denies pain     Nutritional Risks: None Diabetes: No  How often do you need to have someone help you when you read instructions, pamphlets, or other written materials from your doctor or pharmacy?: 1 -  Never  Diabetic?no   Interpreter Needed?: No  Information entered by :: L.Wilson,LPN   Activities of Daily Living    05/07/2022    1:12 PM 09/15/2021   10:00 PM  In your present state of health, do you have any difficulty performing the following activities:  Hearing? 0 0  Vision? 0 0  Difficulty concentrating or making decisions? 0 0  Walking or climbing stairs? 0 1  Dressing or bathing? 0 1  Doing errands, shopping? 0 1  Preparing Food and eating ? N   Using the Toilet? N   In the past six months, have you accidently leaked urine? N   Do you have problems with loss of bowel control? N   Managing your Medications? N   Managing your Finances? N   Housekeeping or managing your Housekeeping? N     Patient Care Team: Janith Lima, MD as PCP - General (Internal Medicine) Stanford Breed Denice Bors, MD as PCP - Cardiology (Cardiology) Stanford Breed Denice Bors, MD as Consulting Physician (Cardiology) Marica Otter, [place] as Consulting Physician (Optometry) Alanda Slim, Neena Rhymes, MD as Consulting Physician (Ophthalmology)  Indicate any recent Medical Services you may have received from other than [facility] providers in the past year (date may be approximate).     Assessment:   This is a routine wellness examination for [place].  Hearing/Vision screen Vision Screening - Comments:: Annual eye exams wear glasses   Dietary issues and exercise activities discussed: Current Exercise Habits:  The patient does not participate in regular exercise at present, Exercise limited by: None identified   Goals Addressed   None    Depression Screen    05/07/2022    1:10 PM 05/07/2022    1:07 PM 04/27/2021   11:20 AM 12/06/2020    1:26 PM  PHQ 2/9 Scores  PHQ - 2 Score 0 0 0 0    Fall Risk    05/07/2022    1:09 PM 04/27/2021   11:18 AM  Fall Risk   Falls in the past year? 0 0  Number falls in past yr: 0 0  Injury with Fall? 0 0  Risk for fall due to :  No Fall Risks  Risk for fall due to: Comment walker   Follow up Falls evaluation completed;Education provided Falls evaluation completed    [place]:  Any stairs in or around the home? Yes  If so, are there any without handrails? No  Home free of loose throw rugs in walkways, pet beds, electrical cords, etc? Yes  Adequate lighting in your home to reduce risk of falls? Yes   ASSISTIVE DEVICES UTILIZED TO PREVENT FALLS:  Life alert? No  Use of a cane, walker or w/c? Yes  Grab bars in the bathroom? Yes  Shower chair or bench in shower? Yes  Elevated toilet seat or a handicapped toilet? Yes     Cognitive Function:    Normal cognitive status assessed by telephone conversation by this Nurse Health Advisor. No abnormalities found.      Immunizations Immunization History  Administered Date(s) Administered   Influenza-Unspecified 08/15/2018   PFIZER(Purple Top)SARS-COV-2 Vaccination 12/20/2019, 01/19/2020, 10/28/2020   Pneumococcal Polysaccharide-23 03/26/2022    TDAP status: Due, Education has been provided regarding the importance of this vaccine. Advised may receive this vaccine at local pharmacy or Health Dept. Aware to provide a copy of the vaccination record if obtained from local pharmacy or Health Dept. Verbalized acceptance and understanding.  Flu Vaccine status:  Declined, Education has been provided regarding the importance of this vaccine but patient still declined. Advised  may receive this vaccine at local pharmacy or Health Dept. Aware to provide a copy of the vaccination record if obtained from local pharmacy or Health Dept. Verbalized acceptance and understanding.  Pneumococcal vaccine status: Due, Education has been provided regarding the importance of this vaccine. Advised may receive this vaccine at local pharmacy or Health Dept. Aware to provide a copy of the vaccination record if obtained from local pharmacy or Health Dept. Verbalized acceptance and understanding.  Covid-19 vaccine status: Completed vaccines  Qualifies for Shingles Vaccine? Yes   Zostavax completed No   Shingrix Completed?: No.    Education has been provided regarding the importance of this vaccine. Patient has been advised to call insurance company to determine out of pocket expense if they have not yet received this vaccine. Advised may also receive vaccine at local pharmacy or Health Dept. Verbalized acceptance and understanding.  Screening Tests Health Maintenance  Topic Date Due   Hepatitis C Screening  Never done   TETANUS/TDAP  Never done   Zoster Vaccines- Shingrix (1 of 2) Never done   DEXA SCAN  Never done   COVID-19 Vaccine (4 - Pfizer series) 12/23/2020   INFLUENZA VACCINE  05/15/2022   Pneumonia Vaccine 29+ Years old (2 - PCV) 03/27/2023   HPV VACCINES  Aged Out    Health Maintenance  Health Maintenance Due  Topic Date Due   Hepatitis C Screening  Never done   TETANUS/TDAP  Never done   Zoster Vaccines- Shingrix (1 of 2) Never done   DEXA SCAN  Never done   COVID-19 Vaccine (4 - Pfizer series) 12/23/2020    Colorectal cancer screening: No longer required.   Mammogram status: No longer required due to age.  Bone Density status: Ordered declined to discuss with PCP . Pt provided with contact info and advised to call to schedule appt.  Lung Cancer Screening: (Low Dose CT Chest recommended if Age 62-80 years, 30 pack-year currently smoking OR have quit w/in  15years.) does not qualify.   Lung Cancer Screening Referral: n/a  Additional Screening:  Hepatitis C Screening: does not qualify;  Vision Screening: Recommended annual ophthalmology exams for early detection of glaucoma and other disorders of the eye. Is the patient up to date with their annual eye exam?  Yes  Who is the provider or what is the name of the office in which the patient attends annual eye exams? Dr.Miller  If pt is not established with a provider, would they like to be referred to a provider to establish care? No .   Dental Screening: Recommended annual dental exams for proper oral hygiene  Community Resource Referral / Chronic Care Management: CRR required this visit?  No   CCM required this visit?  No      Plan:     I have personally reviewed and noted the following in the patient's chart:   Medical and social history Use of alcohol, tobacco or illicit drugs  Current medications and supplements including opioid prescriptions.  Functional ability and status Nutritional status Physical activity Advanced directives List of other physicians Hospitalizations, surgeries, and ER visits in previous 12 months Vitals Screenings to include cognitive, depression, and falls Referrals and appointments  In addition, I have reviewed and discussed with patient certain preventive protocols, quality metrics, and best practice recommendations. A written personalized care plan for preventive services as well as general preventive health recommendations  were provided to patient.     Randel Pigg, LPN   [id number]/81/[id number]   Nurse Notes: none

## 2022-05-09 ENCOUNTER — Other Ambulatory Visit: Payer: Self-pay | Admitting: Internal Medicine

## 2022-05-09 DIAGNOSIS — J411 Mucopurulent chronic bronchitis: Secondary | ICD-10-CM

## 2022-07-18 ENCOUNTER — Other Ambulatory Visit: Payer: Self-pay | Admitting: Cardiology

## 2022-07-18 DIAGNOSIS — I480 Paroxysmal atrial fibrillation: Secondary | ICD-10-CM

## 2022-07-18 NOTE — Telephone Encounter (Signed)
Eliquis '5mg'$  refill request received. Patient is 76 years old, weight-37.1kg, Crea-1.16 on 03/23/2022, Diagnosis-Afib, and last seen by Dr. Stanford Breed on 03/23/2022. Dose is appropriate based on dosing criteria. Will send in refill to requested pharmacy.

## 2022-08-27 ENCOUNTER — Other Ambulatory Visit: Payer: Self-pay | Admitting: Internal Medicine

## 2022-08-27 DIAGNOSIS — F411 Generalized anxiety disorder: Secondary | ICD-10-CM

## 2022-10-01 ENCOUNTER — Other Ambulatory Visit: Payer: Self-pay | Admitting: Internal Medicine

## 2022-10-01 DIAGNOSIS — E785 Hyperlipidemia, unspecified: Secondary | ICD-10-CM

## 2022-10-01 DIAGNOSIS — I2510 Atherosclerotic heart disease of native coronary artery without angina pectoris: Secondary | ICD-10-CM

## 2022-10-09 ENCOUNTER — Encounter: Payer: Self-pay | Admitting: Internal Medicine

## 2022-10-09 ENCOUNTER — Ambulatory Visit (INDEPENDENT_AMBULATORY_CARE_PROVIDER_SITE_OTHER): Payer: Medicare HMO | Admitting: Internal Medicine

## 2022-10-09 VITALS — BP 138/78 | HR 72 | Temp 98.3°F | Resp 16 | Ht 59.0 in | Wt 83.0 lb

## 2022-10-09 DIAGNOSIS — R634 Abnormal weight loss: Secondary | ICD-10-CM | POA: Insufficient documentation

## 2022-10-09 DIAGNOSIS — I5042 Chronic combined systolic (congestive) and diastolic (congestive) heart failure: Secondary | ICD-10-CM

## 2022-10-09 DIAGNOSIS — N184 Chronic kidney disease, stage 4 (severe): Secondary | ICD-10-CM

## 2022-10-09 DIAGNOSIS — I739 Peripheral vascular disease, unspecified: Secondary | ICD-10-CM

## 2022-10-09 DIAGNOSIS — R001 Bradycardia, unspecified: Secondary | ICD-10-CM | POA: Diagnosis not present

## 2022-10-09 DIAGNOSIS — Z23 Encounter for immunization: Secondary | ICD-10-CM | POA: Diagnosis not present

## 2022-10-09 DIAGNOSIS — E785 Hyperlipidemia, unspecified: Secondary | ICD-10-CM

## 2022-10-09 DIAGNOSIS — I480 Paroxysmal atrial fibrillation: Secondary | ICD-10-CM | POA: Diagnosis not present

## 2022-10-09 DIAGNOSIS — R112 Nausea with vomiting, unspecified: Secondary | ICD-10-CM | POA: Insufficient documentation

## 2022-10-09 LAB — BASIC METABOLIC PANEL
BUN: 27 mg/dL — ABNORMAL HIGH (ref 6–23)
CO2: 25 mEq/L (ref 19–32)
Calcium: 10 mg/dL (ref 8.4–10.5)
Chloride: 103 mEq/L (ref 96–112)
Creatinine, Ser: 1.3 mg/dL — ABNORMAL HIGH (ref 0.40–1.20)
GFR: 39.28 mL/min — ABNORMAL LOW (ref >60.00)
Glucose, Bld: 92 mg/dL (ref 70–99)
Potassium: 5.6 mEq/L — ABNORMAL HIGH (ref 3.5–5.1)
Sodium: 137 mEq/L (ref 135–145)

## 2022-10-09 LAB — CBC WITH DIFFERENTIAL/PLATELET
Basophils Absolute: 0.2 10*3/uL — ABNORMAL HIGH (ref 0.0–0.1)
Basophils Relative: 1.8 % (ref 0.0–3.0)
Eosinophils Absolute: 0.7 10*3/uL (ref 0.0–0.7)
Eosinophils Relative: 6.6 % — ABNORMAL HIGH (ref 0.0–5.0)
HCT: 37.2 % (ref 36.0–46.0)
Hemoglobin: 12.3 g/dL (ref 12.0–15.0)
Lymphocytes Relative: 32.2 % (ref 12.0–46.0)
Lymphs Abs: 3.5 10*3/uL (ref 0.7–4.0)
MCHC: 33.1 g/dL (ref 30.0–36.0)
MCV: 88.5 fl (ref 78.0–100.0)
Monocytes Absolute: 1 10*3/uL (ref 0.1–1.0)
Monocytes Relative: 9 % (ref 3.0–12.0)
Neutro Abs: 5.5 10*3/uL (ref 1.4–7.7)
Neutrophils Relative %: 50.4 % (ref 43.0–77.0)
Platelets: 321 10*3/uL (ref 150.0–400.0)
RBC: 4.21 Mil/uL (ref 3.87–5.11)
RDW: 14.4 % (ref 11.5–15.5)
WBC: 11 10*3/uL — ABNORMAL HIGH (ref 4.0–10.5)

## 2022-10-09 LAB — HEPATIC FUNCTION PANEL
ALT: 14 U/L (ref 0–35)
AST: 24 U/L (ref 0–37)
Albumin: 4.2 g/dL (ref 3.5–5.2)
Alkaline Phosphatase: 71 U/L (ref 39–117)
Bilirubin, Direct: 0 mg/dL (ref 0.0–0.3)
Total Bilirubin: 0.4 mg/dL (ref 0.2–1.2)
Total Protein: 8 g/dL (ref 6.0–8.3)

## 2022-10-09 LAB — TSH: TSH: 3.47 u[IU]/mL (ref 0.35–5.50)

## 2022-10-09 NOTE — Progress Notes (Signed)
Subjective:  Patient ID: Alexandra House, female    DOB: [birth year]  Age: 76 y.o. MRN: [id number]  CC: No chief complaint on file.   HPI ALINNA SIPLE presents for ***  Outpatient Medications Prior to Visit  Medication Sig Dispense Refill   apixaban (ELIQUIS) 5 MG TABS tablet TAKE 1 TABLET TWICE DAILY 180 tablet 2   benzonatate (TESSALON) 100 MG capsule TAKE 1 CAPSULE(100 MG) TWICE DAILY AS NEEDED FOR COUGH 180 capsule 1   EPINEPHrine (EPIPEN JR) 0.15 MG/0.3ML injection Inject 15 mg into the muscle as needed. 2 each 3   ergocalciferol (VITAMIN D2) 50000 units capsule Take 50,000 Units by mouth every Monday.     ezetimibe (ZETIA) 10 MG tablet Take 1 tablet (10 mg total) by mouth daily. 90 tablet 3   Fluticasone-Umeclidin-Vilant (TRELEGY ELLIPTA) 100-62.5-25 MCG/ACT AEPB Inhale 1 puff into the lungs daily. 1120 each 1   LORazepam (ATIVAN) 1 MG tablet TAKE 1 TABLET EVERY 8 HOURS AS NEEDED FOR ANXIETY 90 tablet 3   rosuvastatin (CRESTOR) 10 MG tablet TAKE 1 TABLET EVERY DAY 90 tablet 0   No facility-administered medications prior to visit.    ROS Review of Systems  Objective:  BP 118/62 (BP Location: Left Arm, Patient Position: Sitting, Cuff Size: Small)   Pulse 72   Temp 98.3 F (36.8 C) (Oral)   Ht '4\' 11"'$  (1.499 m)   Wt 83 lb (37.6 kg)   LMP  (LMP Unknown)   SpO2 98%   BMI 16.76 kg/m   BP Readings from Last 3 Encounters:  10/09/22 118/62  03/26/22 140/82  03/23/22 (!) 147/81    Wt Readings from Last 3 Encounters:  10/09/22 83 lb (37.6 kg)  03/26/22 84 lb (38.1 kg)  03/23/22 89 lb (40.4 kg)    Physical Exam  Lab Results  Component Value Date   WBC 11.0 (H) 03/23/2022   HGB 11.7 03/23/2022   HCT 34.8 03/23/2022   PLT 317 03/23/2022   GLUCOSE 95 03/23/2022   CHOL 138 03/23/2022   TRIG 95 03/23/2022   HDL 60 03/23/2022   LDLCALC 60 03/23/2022   ALT 13 03/23/2022   AST 20 03/23/2022   NA 139 03/23/2022   K 5.2 03/23/2022   CL 103 03/23/2022   CREATININE  1.16 (H) 03/23/2022   BUN 18 03/23/2022   CO2 19 (L) 03/23/2022   TSH 3.39 02/29/2020   INR 0.9 08/02/2021   HGBA1C 5.2 08/03/2021    VAS US CAROTID  Result Date: 04/05/2022 Carotid Arterial Duplex Study Patient Name:  Alexandra House  Date of Exam:   04/04/2022 Medical Rec #: [id number]        Accession #:    [id number] Date of Birth: January 29, 1946         Patient Gender: F Patient Age:   70 years Exam Location:  [facility] Procedure:      VAS US CAROTID Referring Phys: Aaron Edelman CRENSHAW --------------------------------------------------------------------------------  Indications:       Carotid artery disease and Patient denies cerebrovascular                    symptoms today. Risk Factors:      Hyperlipidemia, current smoker, prior MI, coronary artery                    disease, PAD. Comparison Study:  Prior carotid duplex exam on 06/23/2018 showed highest  velocities in right proximal ICA 138/23 cm/s and left                    proximal ICA 112/22 cm/s. Performing Technologist: Salvadore Dom RVT, RDCS (AE), RDMS  Examination Guidelines: A complete evaluation includes B-mode imaging, spectral Doppler, color Doppler, and power Doppler as needed of all accessible portions of each vessel. Bilateral testing is considered an integral part of a complete examination. Limited examinations for reoccurring indications may be performed as noted.  Right Carotid Findings: +----------+--------+--------+--------+---------------------+------------------+           PSV cm/sEDV cm/sStenosisPlaque Description   Comments           +----------+--------+--------+--------+---------------------+------------------+ CCA Prox  107     10                                                      +----------+--------+--------+--------+---------------------+------------------+ CCA Distal117     21      <50%    homogeneous and      tortuous                                             smooth                                   +----------+--------+--------+--------+---------------------+------------------+ ICA Prox  114     18      1-39%                        intimal thickening +----------+--------+--------+--------+---------------------+------------------+ ICA Mid   112     21                                                      +----------+--------+--------+--------+---------------------+------------------+ ICA Distal106     21                                   tortuous           +----------+--------+--------+--------+---------------------+------------------+ ECA       178     11              homogeneous, smooth                                                       and calcific                            +----------+--------+--------+--------+---------------------+------------------+ +----------+--------+-------+----------------+-------------------+           PSV cm/sEDV cmsDescribe        Arm Pressure (mmHG) +----------+--------+-------+----------------+-------------------+ [id number]            Multiphasic, [id number]                 +----------+--------+-------+----------------+-------------------+ +---------+--------+--+--------+--+---------+  VertebralPSV cm/s95EDV cm/s16Antegrade +---------+--------+--+--------+--+---------+ Right brachial artery, antecubital fossa 42 cm/s biphasic Left Carotid Findings: +----------+--------+--------+--------+---------------------+------------------+           PSV cm/sEDV cm/sStenosisPlaque Description   Comments           +----------+--------+--------+--------+---------------------+------------------+ CCA Prox  80      9                                    intimal thickening +----------+--------+--------+--------+---------------------+------------------+ CCA Distal130     31      <50%    homogeneous and      intimal thickening                                   smooth                                   +----------+--------+--------+--------+---------------------+------------------+ ICA Prox  120     27      1-39%   heterogenous         turbulent flow     +----------+--------+--------+--------+---------------------+------------------+ ICA Mid   112     26                                                      +----------+--------+--------+--------+---------------------+------------------+ ICA Distal128     24                                                      +----------+--------+--------+--------+---------------------+------------------+ ECA       186     11                                                      +----------+--------+--------+--------+---------------------+------------------+ +----------+--------+--------+---------+-------------------+           PSV cm/sEDV cm/sDescribe Arm Pressure (mmHG) +----------+--------+--------+---------+-------------------+ Subclavian202             Turbulent155                 +----------+--------+--------+---------+-------------------+ +---------+--------+--+--------+--+---------+ VertebralPSV cm/s85EDV cm/s25Antegrade +---------+--------+--+--------+--+---------+ Left brachial artery, antecubital fossa 40 cm/s biphasic  Summary: Right Carotid: Velocities in the right ICA are consistent with a 1-39% stenosis.                Non-hemodynamically significant plaque <50% noted in the CCA. Left Carotid: Velocities in the left ICA are consistent with a 1-39% stenosis.               Non-hemodynamically significant plaque <50% noted in the CCA. Vertebrals:  Bilateral vertebral arteries demonstrate antegrade flow. Subclavians: Left subclavian artery flow was disturbed. Normal flow hemodynamics              were seen in the right subclavian artery. *See table(s) above for measurements and observations. Suggest follow up study in 12 months. Electronically  signed by Larae Grooms MD on 04/05/2022 at 5:08:59 PM.    Final    VAS [place] ABI  WITH/WO TBI  Result Date: 04/04/2022  LOWER EXTREMITY DOPPLER STUDY Patient Name:  SHWANDA SOLTIS  Date of Exam:   04/04/2022 Medical Rec #: [id number]        Accession #:    [id number] Date of Birth: March 29, 1946         Patient Gender: F Patient Age:   28 years Exam Location:  [facility] Procedure:      VAS [place] ABI WITH/WO TBI Referring Phys: Scarlette Calico --------------------------------------------------------------------------------  Indications: Patient denies claudication and rest pain symptoms. Her main              complaint is numbness with legs and feet feeling cold all the time. High Risk Factors: Hyperlipidemia, current smoker, prior MI, coronary artery                    disease.  Comparison Study: Prior ABI 06/23/2018 right .83 left .66 Performing Technologist: Salvadore Dom RVT  Examination Guidelines: A complete evaluation includes at minimum, Doppler waveform signals and systolic blood pressure reading at the level of bilateral brachial, anterior tibial, and posterior tibial arteries, when vessel segments are accessible. Bilateral testing is considered an integral part of a complete examination. Photoelectric Plethysmograph (PPG) waveforms and toe systolic pressure readings are included as required and additional duplex testing as needed. Limited examinations for reoccurring indications may be performed as noted.  ABI Findings: +---------+------------------+-----+--------+--------+ Right    Rt Pressure (mmHg)IndexWaveformComment  +---------+------------------+-----+--------+--------+ Brachial 171                                     +---------+------------------+-----+--------+--------+ PTA      151               0.88 biphasic         +---------+------------------+-----+--------+--------+ PERO     147               0.86 biphasic         +---------+------------------+-----+--------+--------+ DP       146               0.85 biphasic          +---------+------------------+-----+--------+--------+ Great Toe73                0.43 Abnormal         +---------+------------------+-----+--------+--------+ +---------+------------------+-----+--------+-------+ Left     Lt Pressure (mmHg)IndexWaveformComment +---------+------------------+-----+--------+-------+ Brachial 155                                    +---------+------------------+-----+--------+-------+ PTA      152               0.89 biphasic        +---------+------------------+-----+--------+-------+ PERO     146               0.85 biphasic        +---------+------------------+-----+--------+-------+ DP       143               0.84 biphasic        +---------+------------------+-----+--------+-------+ Great Toe89                0.52 Abnormal        +---------+------------------+-----+--------+-------+ +-------+-----------+-----------+------------+------------+  ABI/TBIToday's ABIToday's TBIPrevious ABIPrevious TBI +-------+-----------+-----------+------------+------------+ Right  .88        .43        .83         .45          +-------+-----------+-----------+------------+------------+ Left   .89        .52        .84         .42          +-------+-----------+-----------+------------+------------+ Bilateral ABIs and TBIs appear slightly increased compared to prior study on 06/23/2018.  Summary: Right: Resting right ankle-brachial index indicates mild right lower extremity arterial disease. The right toe-brachial index is abnormal. Left: Resting left ankle-brachial index indicates mild left lower extremity arterial disease. The left toe-brachial index is abnormal. *See table(s) above for measurements and observations.  Electronically signed by Larae Grooms MD on 04/04/2022 at 1:33:01 PM.    Final     Assessment & Plan:   Diagnoses and all orders for this visit:  Paroxysmal atrial fibrillation ([place]) -     CBC with Differential/Platelet;  Future -     TSH; Future -     Basic metabolic panel; Future  Sinus bradycardia -     CBC with Differential/Platelet; Future -     TSH; Future -     Basic metabolic panel; Future  Systolic and diastolic CHF, chronic (HCC) -     CBC with Differential/Platelet; Future -     Basic metabolic panel; Future  Hyperlipidemia with target LDL less than 130 -     CBC with Differential/Platelet; Future -     Hepatic function panel; Future -     Basic metabolic panel; Future  Claudication, intermittent (HCC) -     CBC with Differential/Platelet; Future -     Basic metabolic panel; Future   I am having Ellysa A. Schlotzhauer maintain her ergocalciferol, ezetimibe, Trelegy Ellipta, EPINEPHrine, benzonatate, Eliquis, LORazepam, and rosuvastatin.  No orders of the defined types were placed in this encounter.    Follow-up: No follow-ups on file.  Scarlette Calico, MD

## 2022-10-10 DIAGNOSIS — Z23 Encounter for immunization: Secondary | ICD-10-CM | POA: Insufficient documentation

## 2022-10-10 DIAGNOSIS — N184 Chronic kidney disease, stage 4 (severe): Secondary | ICD-10-CM | POA: Insufficient documentation

## 2022-11-05 DIAGNOSIS — M62830 Muscle spasm of back: Secondary | ICD-10-CM | POA: Diagnosis not present

## 2022-11-26 ENCOUNTER — Other Ambulatory Visit: Payer: Self-pay | Admitting: Cardiology

## 2022-12-18 ENCOUNTER — Other Ambulatory Visit: Payer: Self-pay | Admitting: Internal Medicine

## 2022-12-18 DIAGNOSIS — E785 Hyperlipidemia, unspecified: Secondary | ICD-10-CM

## 2022-12-18 DIAGNOSIS — I2510 Atherosclerotic heart disease of native coronary artery without angina pectoris: Secondary | ICD-10-CM

## 2022-12-26 ENCOUNTER — Other Ambulatory Visit: Payer: Self-pay | Admitting: Internal Medicine

## 2022-12-26 DIAGNOSIS — J411 Mucopurulent chronic bronchitis: Secondary | ICD-10-CM

## 2023-03-05 ENCOUNTER — Other Ambulatory Visit: Payer: Self-pay | Admitting: Internal Medicine

## 2023-03-05 DIAGNOSIS — E785 Hyperlipidemia, unspecified: Secondary | ICD-10-CM

## 2023-03-05 DIAGNOSIS — I2510 Atherosclerotic heart disease of native coronary artery without angina pectoris: Secondary | ICD-10-CM

## 2023-03-14 ENCOUNTER — Other Ambulatory Visit: Payer: Self-pay | Admitting: Internal Medicine

## 2023-03-14 DIAGNOSIS — J411 Mucopurulent chronic bronchitis: Secondary | ICD-10-CM

## 2023-04-01 NOTE — Progress Notes (Signed)
HPI: FU cardiomyopathy and atrial fibrillation. Myoview in Nov 2013 demonstrated no ischemia and an EF of 81%. She had a low-level exercise Lexiscan study and did develop significant ST abnormalities consistent with ischemia and transient complete heart block with infusion. She has been treated medically with low threshold for cath if recurrent symptoms. Had prolonged hospitalization July 2018 secondary to respiratory failure felt secondary to emphysema and aspiration pneumonia.  She had elevated enzymes with troponin of 4.39.  Ischemia evaluation recommended as outpatient. Continued medical therapy versus catheterization discussed with patient and she preferred conservative management. Nuclear study September 2019 showed ejection fraction 45% and no ischemia or infarction. Echocardiogram October 2022 showed normal LV function, grade 1 diastolic dysfunction, mild to moderate left atrial enlargement, mild to moderate mitral regurgitation, trace aortic insufficiency.  CTA October 2022 showed left subclavian stenosis.  Prior to that admission she had had difficulties with speech.  She was discharged with a monitor that showed paroxysmal atrial fibrillation. Carotid Dopplers June 2023 showed 1 to 39% bilateral stenosis.  ABIs June 2023 mild on the right with abnormal toe brachial index and mild on the left with abnormal toe-brachial index.  Also with history of paroxysmal atrial fibrillation.  Since she was last seen she denies increased dyspnea, chest pain or syncope.  Current Outpatient Medications  Medication Sig Dispense Refill   apixaban (ELIQUIS) 5 MG TABS tablet TAKE 1 TABLET TWICE DAILY 180 tablet 2   benzonatate (TESSALON) 100 MG capsule TAKE 1 CAPSULE TWICE DAILY AS NEEDED FOR COUGH 180 capsule 0   EPINEPHrine (EPIPEN JR) 0.15 MG/0.3ML injection Inject 15 mg into the muscle as needed. 2 each 3   ergocalciferol (VITAMIN D2) 50000 units capsule Take 50,000 Units by mouth every Monday.      ezetimibe (ZETIA) 10 MG tablet TAKE 1 TABLET EVERY DAY 90 tablet 3   Fluticasone-Umeclidin-Vilant (TRELEGY ELLIPTA) 100-62.5-25 MCG/ACT AEPB Inhale 1 puff into the lungs daily. 1120 each 1   LORazepam (ATIVAN) 1 MG tablet TAKE 1 TABLET EVERY 8 HOURS AS NEEDED FOR ANXIETY 90 tablet 3   rosuvastatin (CRESTOR) 10 MG tablet TAKE 1 TABLET EVERY DAY 90 tablet 0   No current facility-administered medications for this visit.     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 Echo 12/12: EF 50-55%; b. Echo 11/13:  mild LVH, EF 60-65%; c. 04/2017 Echo: EF 45-50%; d. 06/2018 Echo EF 55-65%; e. 07/2021 Echo: EF 60-65%, no rwma, GrI DD, mild-mod LAD. Mild-mod MR.   Acute on chronic respiratory failure (HCC)    Anxiety    Caloric malnutrition (HCC)    Carotid stenosis    Coronary artery disease    Fibromyalgia    HLD (hyperlipidemia)    Hx of cardiovascular stress test    a. Lex MV 11/13: + significant ECG changes and CHB with infusion; EF 81%, no ischemia; b. 06/2018 MV: EF 45% (55-60% visually). No ischemia/infarct-->Low risk.   Hypertension    Neuropathy    Osteoporosis    Stenosis of left subclavian artery (HCC)    Syncope 08/2009   a. ETT-Echo 1/11:  poor ex tol, submax exercise, no WMA or ECG changes c/w ischemia;     TIA (transient ischemic attack) 07/2021    Past Surgical History:  Procedure Laterality Date   None      Social History   Socioeconomic History   Marital status: Married  Spouse name: Not on file   Number of children: 2   Years of education: Not on file   Highest education level: Not on file  Occupational History   Not on file  Tobacco Use   Smoking status: Every Day    Packs/day: .5    Types: Cigarettes   Smokeless tobacco: Current  Vaping Use   Vaping Use: Never used  Substance and Sexual Activity   Alcohol use: Yes    Comment: glass wine once every 6 mos. per patient   Drug use: No   Sexual  activity: Not on file  Other Topics Concern   Not on file  Social History Narrative   Lives with husband   Social Determinants of Health   Financial Resource Strain: Low Risk  (04/04/2023)   Overall Financial Resource Strain (CARDIA)    Difficulty of Paying Living Expenses: Not hard at all  Food Insecurity: No Food Insecurity (04/04/2023)   Hunger Vital Sign    Worried About Running Out of Food in the Last Year: Never true    Ran Out of Food in the Last Year: Never true  Transportation Needs: No Transportation Needs (04/04/2023)   PRAPARE - Administrator, Civil Service (Medical): No    Lack of Transportation (Non-Medical): No  Physical Activity: Inactive (04/04/2023)   Exercise Vital Sign    Days of Exercise per Week: 0 days    Minutes of Exercise per Session: 0 min  Stress: No Stress Concern Present (04/04/2023)   [employer] of Occupational Health - Occupational Stress Questionnaire    Feeling of Stress : Not at all  Social Connections: Moderately Isolated (04/04/2023)   Social Connection and Isolation Panel [NHANES]    Frequency of Communication with Friends and Family: Three times a week    Frequency of Social Gatherings with Friends and Family: Three times a week    Attends Religious Services: More than 4 times per year    Active Member of Clubs or Organizations: No    Attends Banker Meetings: Never    Marital Status: Widowed  Intimate Partner Violence: Not At Risk (04/04/2023)   Humiliation, Afraid, Rape, and Kick questionnaire    Fear of Current or Ex-Partner: No    Emotionally Abused: No    Physically Abused: No    Sexually Abused: No    Family History  Problem Relation Age of Onset   CAD Mother 44       Status post PCI   Breast cancer Mother    Lung cancer Father    Colon cancer Neg Hx     ROS: no fevers or chills, productive cough, hemoptysis, dysphasia, odynophagia, melena, hematochezia, dysuria, hematuria, rash, seizure activity,  orthopnea, PND, pedal edema, claudication. Remaining systems are negative.  Physical Exam: Well-developed well-nourished in no acute distress.  Skin is warm and dry.  HEENT is normal.  Neck is supple.  Chest is clear to auscultation with normal expansion.  Cardiovascular exam is regular rate and rhythm.  Abdominal exam nontender or distended. No masses palpated. Extremities show no edema. neuro grossly intact  EKG Interpretation  Date/Time:  Tuesday April 09 2023 08:15:25 EDT Ventricular Rate:  72 PR Interval:  140 QRS Duration: 96 QT Interval:  418 QTC Calculation: 457 R Axis:   78 Text Interpretation: Sinus rhythm with occasional Premature ventricular complexes and Premature atrial complexes Nonspecific ST and T wave abnormality When compared with ECG of 15-Sep-2021 22:22, Premature ventricular complexes are now  Present Premature atrial complexes are now Present Nonspecific T wave abnormality now evident in Inferior leads Nonspecific T wave abnormality, worse in Lateral leads Confirmed by Olga Millers ([id number]) on 04/09/2023 8:29:27 AM    A/P  1 paroxysmal atrial fibrillation-patient remains in sinus rhythm.  She is not on AV nodal blocking agents due to history of bradycardia.  Continue apixaban.  Check hemoglobin and renal function.  2 previous episode of complete heart block-noted on monitor.  This was felt to be vagal in etiology and she has no history of syncope.  Will continue to follow.  3 history of cardiomyopathy-LV function normal on most recent echocardiogram.  4 history of mild to moderate mitral regurgitation-will plan repeat echocardiogram.  At next office visit.  5 hyperlipidemia-continue Crestor and Zetia.  6 tobacco abuse-patient counseled on discontinuing.  7 history of left subclavian stenosis-this was noted on previous CTA.  Continue statin.  Olga Millers, MD

## 2023-04-04 ENCOUNTER — Ambulatory Visit (INDEPENDENT_AMBULATORY_CARE_PROVIDER_SITE_OTHER): Payer: Medicare HMO

## 2023-04-04 VITALS — Ht 59.0 in | Wt 82.0 lb

## 2023-04-04 DIAGNOSIS — Z0000 Encounter for general adult medical examination without abnormal findings: Secondary | ICD-10-CM

## 2023-04-04 NOTE — Progress Notes (Signed)
Subjective:   Alexandra House is a 77 y.o. female who presents for Medicare Annual (Subsequent) preventive examination.  Visit Complete: Virtual  I connected with  Alexandra House on 04/04/23 by a audio enabled telemedicine application and verified that I am speaking with the correct person using two identifiers.  Patient Location: Home  Provider Location: Office/Clinic  I discussed the limitations of evaluation and management by telemedicine. The patient expressed understanding and agreed to proceed.  Review of Systems     Cardiac Risk Factors include: advanced age (>84men, >51 women);dyslipidemia;family history of premature cardiovascular disease;sedentary lifestyle;smoking/ tobacco exposure     Objective:    Today's Vitals   04/04/23 1533  Weight: 82 lb (37.2 kg)  Height: 4\' 11"  (1.499 m)  PainSc: 7   PainLoc: Foot   Body mass index is 16.56 kg/m.     04/04/2023    3:36 PM 05/07/2022    1:08 PM 09/15/2021   10:00 PM 08/02/2021   11:51 AM 04/27/2021   11:15 AM 08/27/2018   11:29 AM 04/20/2017    3:37 PM  Advanced Directives  Does Patient Have a Medical Advance Directive? Yes Yes No No Yes No No  Type of Advance Directive Living will Healthcare Power of [place];Living will   Living will    Does patient want to make changes to medical advance directive?     No - Patient declined    Copy of Healthcare Power of Attorney in Chart?  No - copy requested       Would patient like information on creating a medical advance directive?   No - Patient declined No - Patient declined   No - Patient declined    Current Medications (verified) Outpatient Encounter Medications as of 04/04/2023  Medication Sig   apixaban (ELIQUIS) 5 MG TABS tablet TAKE 1 TABLET TWICE DAILY   ezetimibe (ZETIA) 10 MG tablet TAKE 1 TABLET EVERY DAY   Fluticasone-Umeclidin-Vilant (TRELEGY ELLIPTA) 100-62.5-25 MCG/ACT AEPB Inhale 1 puff into the lungs daily.   LORazepam (ATIVAN) 1 MG tablet TAKE 1 TABLET  EVERY 8 HOURS AS NEEDED FOR ANXIETY   benzonatate (TESSALON) 100 MG capsule TAKE 1 CAPSULE TWICE DAILY AS NEEDED FOR COUGH   EPINEPHrine (EPIPEN JR) 0.15 MG/0.3ML injection Inject 15 mg into the muscle as needed.   ergocalciferol (VITAMIN D2) 50000 units capsule Take 50,000 Units by mouth every Monday.   rosuvastatin (CRESTOR) 10 MG tablet TAKE 1 TABLET EVERY DAY   No facility-administered encounter medications on file as of 04/04/2023.    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 Echo 12/12: EF 50-55%; b. Echo 11/13:  mild LVH, EF 60-65%; c. 04/2017 Echo: EF 45-50%; d. 06/2018 Echo EF 55-65%; e. 07/2021 Echo: EF 60-65%, no rwma, GrI DD, mild-mod LAD. Mild-mod MR.   Acute on chronic respiratory failure (HCC)    Anxiety    Caloric malnutrition (HCC)    Carotid stenosis    Coronary artery disease    Fibromyalgia    HLD (hyperlipidemia)    Hx of cardiovascular stress test    a. Lex MV 11/13: + significant ECG changes and CHB with infusion; EF 81%, no ischemia; b. 06/2018 MV: EF 45% (55-60% visually). No ischemia/infarct-->Low risk.   Hypertension    Neuropathy    Osteoporosis    Stenosis of left subclavian artery (HCC)  Syncope 08/2009   a. ETT-Echo 1/11:  poor ex tol, submax exercise, no WMA or ECG changes c/w ischemia;     TIA (transient ischemic attack) 07/2021   Past Surgical History:  Procedure Laterality Date   None     Family History  Problem Relation Age of Onset   CAD Mother 71       Status post PCI   Breast cancer Mother    Lung cancer Father    Colon cancer Neg Hx    Social History   Socioeconomic History   Marital status: Married    Spouse name: Not on file   Number of children: 2   Years of education: Not on file   Highest education level: Not on file  Occupational History   Not on file  Tobacco Use   Smoking status:  Every Day    Packs/day: .5    Types: Cigarettes   Smokeless tobacco: Current  Vaping Use   Vaping Use: Never used  Substance and Sexual Activity   Alcohol use: Yes    Comment: glass wine once every 6 mos. per patient   Drug use: No   Sexual activity: Not on file  Other Topics Concern   Not on file  Social History Narrative   Lives with husband   Social Determinants of Health   Financial Resource Strain: Low Risk  (04/04/2023)   Overall Financial Resource Strain (CARDIA)    Difficulty of Paying Living Expenses: Not hard at all  Food Insecurity: No Food Insecurity (04/04/2023)   Hunger Vital Sign    Worried About Running Out of Food in the Last Year: Never true    Ran Out of Food in the Last Year: Never true  Transportation Needs: No Transportation Needs (04/04/2023)   PRAPARE - Administrator, Civil Service (Medical): No    Lack of Transportation (Non-Medical): No  Physical Activity: Inactive (04/04/2023)   Exercise Vital Sign    Days of Exercise per Week: 0 days    Minutes of Exercise per Session: 0 min  Stress: No Stress Concern Present (04/04/2023)   [employer] of Occupational Health - Occupational Stress Questionnaire    Feeling of Stress : Not at all  Social Connections: Moderately Isolated (04/04/2023)   Social Connection and Isolation Panel [NHANES]    Frequency of Communication with Friends and Family: Three times a week    Frequency of Social Gatherings with Friends and Family: Three times a week    Attends Religious Services: More than 4 times per year    Active Member of Clubs or Organizations: No    Attends Banker Meetings: Never    Marital Status: Widowed    Tobacco Counseling Ready to quit: Not Answered Counseling given: Not Answered   Clinical Intake:  Pre-visit preparation completed: Yes  Pain : 0-10 Pain Score: 7  Pain Type: Chronic pain Pain Location: Foot Pain Orientation: Left, Right     BMI - recorded:  16.56 Nutritional Status: BMI <19  Underweight Nutritional Risks: None Diabetes: No  How often do you need to have someone help you when you read instructions, pamphlets, or other written materials from your doctor or pharmacy?: 1 - Never What is the last grade level you completed in school?: declined  Interpreter Needed?: No  Information entered by :: Susie Cassette, LPN.   Activities of Daily Living    04/04/2023    3:41 PM 05/07/2022    1:12 PM  In your  present state of health, do you have any difficulty performing the following activities:  Hearing? 0 0  Vision? 0 0  Difficulty concentrating or making decisions? 0 0  Walking or climbing stairs? 1 0  Dressing or bathing? 0 0  Doing errands, shopping? 1 0  Preparing Food and eating ? N N  Using the Toilet? N N  In the past six months, have you accidently leaked urine? Y N  Do you have problems with loss of bowel control? N N  Managing your Medications? N N  Managing your Finances? N N  Housekeeping or managing your Housekeeping? N N    Patient Care Team: Etta Grandchild, MD as PCP - General (Internal Medicine) Jens Som Madolyn Frieze, MD as PCP - Cardiology (Cardiology) Jens Som Madolyn Frieze, MD as Consulting Physician (Cardiology) Blima Ledger, OD as Consulting Physician (Optometry) Genia Del, Daisy Blossom, MD as Consulting Physician (Ophthalmology)  Indicate any recent Medical Services you may have received from other than [facility] providers in the past year (date may be approximate).     Assessment:   This is a routine wellness examination for [place].  Hearing/Vision screen Hearing Screening - Comments:: Denies hearing difficulties.  Vision Screening - Comments:: Wears rx glasses - up to date with routine eye exams with Blima Ledger, OD.   Dietary issues and exercise activities discussed:     Goals Addressed             This Visit's Progress    Client understands the importance of follow-up with providers by  attending scheduled visits        Depression Screen    04/04/2023    3:42 PM 10/09/2022    1:29 PM 05/07/2022    1:10 PM 05/07/2022    1:07 PM 04/27/2021   11:20 AM 12/06/2020    1:26 PM  PHQ 2/9 Scores  PHQ - 2 Score 0 0 0 0 0 0  PHQ- 9 Score 6 0        Fall Risk    04/04/2023    3:37 PM 10/09/2022    1:29 PM 05/07/2022    1:09 PM 04/27/2021   11:18 AM  Fall Risk   Falls in the past year? 1 1 0 0  Number falls in past yr: 0 0 0 0  Injury with Fall? 0 0 0 0  Risk for fall due to : No Fall Risks No Fall Risks  No Fall Risks  Risk for fall due to: Comment   walker   Follow up  Falls evaluation completed Falls evaluation completed;Education provided Falls evaluation completed    MEDICARE RISK AT HOME:  Medicare Risk at Home - 04/04/23 1537     Any stairs in or around the home? Yes    If so, are there any without handrails? No    Home free of loose throw rugs in walkways, pet beds, electrical cords, etc? Yes    Adequate lighting in your home to reduce risk of falls? Yes    Life alert? No    Use of a cane, walker or w/c? Yes    Grab bars in the bathroom? Yes    Shower chair or bench in shower? Yes    Elevated toilet seat or a handicapped toilet? Yes             TIMED UP AND GO:  Was the test performed?  No    Cognitive Function:        04/04/2023  3:41 PM  6CIT Screen  What Year? 0 points  What month? 0 points  What time? 0 points  Count back from 20 0 points  Months in reverse 0 points  Repeat phrase 0 points  Total Score 0 points    Immunizations Immunization History  Administered Date(s) Administered   Fluad Quad(high Dose 65+) 10/09/2022   Influenza-Unspecified 08/15/2018   PFIZER(Purple Top)SARS-COV-2 Vaccination 12/20/2019, 01/19/2020, 10/28/2020   Pneumococcal Polysaccharide-23 03/26/2022    TDAP status: Due, Education has been provided regarding the importance of this vaccine. Advised may receive this vaccine at local pharmacy or Health  Dept. Aware to provide a copy of the vaccination record if obtained from local pharmacy or Health Dept. Verbalized acceptance and understanding.  Flu Vaccine status: Up to date  Pneumococcal vaccine status: Up to date  Covid-19 vaccine status: Completed vaccines  Qualifies for Shingles Vaccine? Yes   Zostavax completed No   Shingrix Completed?: No.    Education has been provided regarding the importance of this vaccine. Patient has been advised to call insurance company to determine out of pocket expense if they have not yet received this vaccine. Advised may also receive vaccine at local pharmacy or Health Dept. Verbalized acceptance and understanding.  Screening Tests Health Maintenance  Topic Date Due   DTaP/Tdap/Td (1 - Tdap) Never done   Zoster Vaccines- Shingrix (1 of 2) Never done   COVID-19 Vaccine (4 - 2023-24 season) 06/15/2022   Pneumonia Vaccine 32+ Years old (2 of 2 - PCV) 03/27/2023   DEXA SCAN  10/10/2023 (Originally 11/21/2008)   Hepatitis C Screening  10/10/2023 (Originally 11/21/1963)   INFLUENZA VACCINE  05/16/2023   Medicare Annual Wellness (AWV)  04/03/2024   HPV VACCINES  Aged Out    Health Maintenance  Health Maintenance Due  Topic Date Due   DTaP/Tdap/Td (1 - Tdap) Never done   Zoster Vaccines- Shingrix (1 of 2) Never done   COVID-19 Vaccine (4 - 2023-24 season) 06/15/2022   Pneumonia Vaccine 38+ Years old (2 of 2 - PCV) 03/27/2023    Colorectal cancer screening: No longer required.   Mammogram status: No longer required due to age.  Bone Density status: Postponed until 10/10/2023  Lung Cancer Screening: (Low Dose CT Chest recommended if Age 32-80 years, 20 pack-year currently smoking OR have quit w/in 15years.) does not qualify.   Lung Cancer Screening Referral: no  Additional Screening:  Hepatitis C Screening: does qualify; Postponed  Vision Screening: Recommended annual ophthalmology exams for early detection of glaucoma and other disorders of  the eye. Is the patient up to date with their annual eye exam?  Yes  Who is the provider or what is the name of the office in which the patient attends annual eye exams? Blima Ledger, OD. If pt is not established with a provider, would they like to be referred to a provider to establish care? No .   Dental Screening: Recommended annual dental exams for proper oral hygiene  Diabetic Foot Exam: Does not apply.  Community Resource Referral / Chronic Care Management: CRR required this visit?  No   CCM required this visit?  No     Plan:     I have personally reviewed and noted the following in the patient's chart:   Medical and social history Use of alcohol, tobacco or illicit drugs  Current medications and supplements including opioid prescriptions. Patient is not currently taking opioid prescriptions. Functional ability and status Nutritional status Physical activity Advanced directives List of  other physicians Hospitalizations, surgeries, and ER visits in previous 12 months Vitals Screenings to include cognitive, depression, and falls Referrals and appointments  In addition, I have reviewed and discussed with patient certain preventive protocols, quality metrics, and best practice recommendations. A written personalized care plan for preventive services as well as general preventive health recommendations were provided to patient.     Mickeal Needy, LPN   11/28/863   After Visit Summary: (Mail) Due to this being a telephonic visit, the after visit summary with patients personalized plan was offered to patient via mail   Nurse Notes: Normal cognitive status assessed by direct observation via telephone conversation by this Nurse Health Advisor. No abnormalities found.

## 2023-04-04 NOTE — Patient Instructions (Signed)
Alexandra House , Thank you for taking time to come for your Medicare Wellness Visit. I appreciate your ongoing commitment to your health goals. Please review the following plan we discussed and let me know if I can assist you in the future.   These are the goals we discussed:  Goals      Client understands the importance of follow-up with providers by attending scheduled visits        This is a list of the screening recommended for you and due dates:  Health Maintenance  Topic Date Due   DTaP/Tdap/Td vaccine (1 - Tdap) Never done   Zoster (Shingles) Vaccine (1 of 2) Never done   COVID-19 Vaccine (4 - 2023-24 season) 06/15/2022   Pneumonia Vaccine (2 of 2 - PCV) 03/27/2023   DEXA scan (bone density measurement)  10/10/2023*   Hepatitis C Screening  10/10/2023*   Flu Shot  05/16/2023   Medicare Annual Wellness Visit  04/03/2024   HPV Vaccine  Aged Out  *Topic was postponed. The date shown is not the original due date.    Advanced directives: Patient has a Living Will.  Conditions/risks identified: Yes  Next appointment: Follow up in one year for your annual wellness visit by calling [phone].   Preventive Care 29 Years and Older, Female Preventive care refers to lifestyle choices and visits with your health care provider that can promote health and wellness. What does preventive care include? A yearly physical exam. This is also called an annual well check. Dental exams once or twice a year. Routine eye exams. Ask your health care provider how often you should have your eyes checked. Personal lifestyle choices, including: Daily care of your teeth and gums. Regular physical activity. Eating a healthy diet. Avoiding tobacco and drug use. Limiting alcohol use. Practicing safe sex. Taking low-dose aspirin every day. Taking vitamin and mineral supplements as recommended by your health care provider. What happens during an annual well check? The services and screenings done by  your health care provider during your annual well check will depend on your age, overall health, lifestyle risk factors, and family history of disease. Counseling  Your health care provider may ask you questions about your: Alcohol use. Tobacco use. Drug use. Emotional well-being. Home and relationship well-being. Sexual activity. Eating habits. History of falls. Memory and ability to understand (cognition). Work and work Astronomer. Reproductive health. Screening  You may have the following tests or measurements: Height, weight, and BMI. Blood pressure. Lipid and cholesterol levels. These may be checked every 5 years, or more frequently if you are over 45 years old. Skin check. Lung cancer screening. You may have this screening every year starting at age 36 if you have a 30-pack-year history of smoking and currently smoke or have quit within the past 15 years. Fecal occult blood test (FOBT) of the stool. You may have this test every year starting at age 54. Flexible sigmoidoscopy or colonoscopy. You may have a sigmoidoscopy every 5 years or a colonoscopy every 10 years starting at age 55. Hepatitis C blood test. Hepatitis B blood test. Sexually transmitted disease (STD) testing. Diabetes screening. This is done by checking your blood sugar (glucose) after you have not eaten for a while (fasting). You may have this done every 1-3 years. Bone density scan. This is done to screen for osteoporosis. You may have this done starting at age 12. Mammogram. This may be done every 1-2 years. Talk to your health care provider about how often  you should have regular mammograms. Talk with your health care provider about your test results, treatment options, and if necessary, the need for more tests. Vaccines  Your health care provider may recommend certain vaccines, such as: Influenza vaccine. This is recommended every year. Tetanus, diphtheria, and acellular pertussis (Tdap, Td) vaccine. You  may need a Td booster every 10 years. Zoster vaccine. You may need this after age 65. Pneumococcal 13-valent conjugate (PCV13) vaccine. One dose is recommended after age 78. Pneumococcal polysaccharide (PPSV23) vaccine. One dose is recommended after age 21. Talk to your health care provider about which screenings and vaccines you need and how often you need them. This information is not intended to replace advice given to you by your health care provider. Make sure you discuss any questions you have with your health care provider. Document Released: 10/28/2015 Document Revised: 06/20/2016 Document Reviewed: 08/02/2015 Elsevier Interactive Patient Education  2017 [employer].  Fall Prevention in the Home Falls can cause injuries. They can happen to people of all ages. There are many things you can do to make your home safe and to help prevent falls. What can I do on the outside of my home? Regularly fix the edges of walkways and driveways and fix any cracks. Remove anything that might make you trip as you walk through a door, such as a raised step or threshold. Trim any bushes or trees on the path to your home. Use bright outdoor lighting. Clear any walking paths of anything that might make someone trip, such as rocks or tools. Regularly check to see if handrails are loose or broken. Make sure that both sides of any steps have handrails. Any raised decks and porches should have guardrails on the edges. Have any leaves, snow, or ice cleared regularly. Use sand or salt on walking paths during winter. Clean up any spills in your garage right away. This includes oil or grease spills. What can I do in the bathroom? Use night lights. Install grab bars by the toilet and in the tub and shower. Do not use towel bars as grab bars. Use non-skid mats or decals in the tub or shower. If you need to sit down in the shower, use a plastic, non-slip stool. Keep the floor dry. Clean up any water that spills  on the floor as soon as it happens. Remove soap buildup in the tub or shower regularly. Attach bath mats securely with double-sided non-slip rug tape. Do not have throw rugs and other things on the floor that can make you trip. What can I do in the bedroom? Use night lights. Make sure that you have a light by your bed that is easy to reach. Do not use any sheets or blankets that are too big for your bed. They should not hang down onto the floor. Have a firm chair that has side arms. You can use this for support while you get dressed. Do not have throw rugs and other things on the floor that can make you trip. What can I do in the kitchen? Clean up any spills right away. Avoid walking on wet floors. Keep items that you use a lot in easy-to-reach places. If you need to reach something above you, use a strong step stool that has a grab bar. Keep electrical cords out of the way. Do not use floor polish or wax that makes floors slippery. If you must use wax, use non-skid floor wax. Do not have throw rugs and other things on  the floor that can make you trip. What can I do with my stairs? Do not leave any items on the stairs. Make sure that there are handrails on both sides of the stairs and use them. Fix handrails that are broken or loose. Make sure that handrails are as long as the stairways. Check any carpeting to make sure that it is firmly attached to the stairs. Fix any carpet that is loose or worn. Avoid having throw rugs at the top or bottom of the stairs. If you do have throw rugs, attach them to the floor with carpet tape. Make sure that you have a light switch at the top of the stairs and the bottom of the stairs. If you do not have them, ask someone to add them for you. What else can I do to help prevent falls? Wear shoes that: Do not have high heels. Have rubber bottoms. Are comfortable and fit you well. Are closed at the toe. Do not wear sandals. If you use a stepladder: Make  sure that it is fully opened. Do not climb a closed stepladder. Make sure that both sides of the stepladder are locked into place. Ask someone to hold it for you, if possible. Clearly mark and make sure that you can see: Any grab bars or handrails. First and last steps. Where the edge of each step is. Use tools that help you move around (mobility aids) if they are needed. These include: Canes. Walkers. Scooters. Crutches. Turn on the lights when you go into a dark area. Replace any light bulbs as soon as they burn out. Set up your furniture so you have a clear path. Avoid moving your furniture around. If any of your floors are uneven, fix them. If there are any pets around you, be aware of where they are. Review your medicines with your doctor. Some medicines can make you feel dizzy. This can increase your chance of falling. Ask your doctor what other things that you can do to help prevent falls. This information is not intended to replace advice given to you by your health care provider. Make sure you discuss any questions you have with your health care provider. Document Released: 07/28/2009 Document Revised: 03/08/2016 Document Reviewed: 11/05/2014 Elsevier Interactive Patient Education  2017 [employer].

## 2023-04-09 ENCOUNTER — Encounter: Payer: Self-pay | Admitting: Cardiology

## 2023-04-09 ENCOUNTER — Ambulatory Visit (HOSPITAL_COMMUNITY): Payer: Medicare HMO | Attending: Cardiology | Admitting: Cardiology

## 2023-04-09 ENCOUNTER — Ambulatory Visit (HOSPITAL_COMMUNITY): Payer: Medicare HMO

## 2023-04-09 VITALS — BP 154/62 | HR 74 | Ht 59.0 in | Wt 86.0 lb

## 2023-04-09 DIAGNOSIS — I2510 Atherosclerotic heart disease of native coronary artery without angina pectoris: Secondary | ICD-10-CM

## 2023-04-09 DIAGNOSIS — I480 Paroxysmal atrial fibrillation: Secondary | ICD-10-CM

## 2023-04-09 DIAGNOSIS — Z720 Tobacco use: Secondary | ICD-10-CM

## 2023-04-09 DIAGNOSIS — E7800 Pure hypercholesterolemia, unspecified: Secondary | ICD-10-CM | POA: Diagnosis not present

## 2023-04-09 DIAGNOSIS — I509 Heart failure, unspecified: Secondary | ICD-10-CM | POA: Diagnosis not present

## 2023-04-09 NOTE — Patient Instructions (Signed)
    Follow-Up: At [facility], you and your health needs are our priority.  As part of our continuing mission to provide you with exceptional heart care, we have created designated Provider Care Teams.  These Care Teams include your primary Cardiologist (physician) and Advanced Practice Providers (APPs -  Physician Assistants and Nurse Practitioners) who all work together to provide you with the care you need, when you need it.  We recommend signing up for the patient portal called "MyChart".  Sign up information is provided on this After Visit Summary.  MyChart is used to connect with patients for Virtual Visits (Telemedicine).  Patients are able to view lab/test results, encounter notes, upcoming appointments, etc.  Non-urgent messages can be sent to your provider as well.   To learn more about what you can do with MyChart, go to [email or web address].    Your next appointment:   6 month(s)  Provider:   Brian Crenshaw, MD      

## 2023-04-10 ENCOUNTER — Encounter: Payer: Self-pay | Admitting: Internal Medicine

## 2023-04-10 ENCOUNTER — Ambulatory Visit (INDEPENDENT_AMBULATORY_CARE_PROVIDER_SITE_OTHER): Payer: Medicare HMO | Admitting: Internal Medicine

## 2023-04-10 ENCOUNTER — Encounter: Payer: Self-pay | Admitting: *Deleted

## 2023-04-10 ENCOUNTER — Ambulatory Visit (INDEPENDENT_AMBULATORY_CARE_PROVIDER_SITE_OTHER): Payer: Medicare HMO

## 2023-04-10 VITALS — BP 138/68 | HR 72 | Temp 98.3°F | Ht 59.0 in | Wt 85.0 lb

## 2023-04-10 DIAGNOSIS — I509 Heart failure, unspecified: Secondary | ICD-10-CM | POA: Diagnosis not present

## 2023-04-10 DIAGNOSIS — Z0001 Encounter for general adult medical examination with abnormal findings: Secondary | ICD-10-CM

## 2023-04-10 DIAGNOSIS — J449 Chronic obstructive pulmonary disease, unspecified: Secondary | ICD-10-CM | POA: Diagnosis not present

## 2023-04-10 DIAGNOSIS — Z0000 Encounter for general adult medical examination without abnormal findings: Secondary | ICD-10-CM | POA: Diagnosis not present

## 2023-04-10 DIAGNOSIS — R059 Cough, unspecified: Secondary | ICD-10-CM | POA: Diagnosis not present

## 2023-04-10 DIAGNOSIS — F411 Generalized anxiety disorder: Secondary | ICD-10-CM | POA: Diagnosis not present

## 2023-04-10 DIAGNOSIS — R053 Chronic cough: Secondary | ICD-10-CM | POA: Diagnosis not present

## 2023-04-10 DIAGNOSIS — Z23 Encounter for immunization: Secondary | ICD-10-CM | POA: Diagnosis not present

## 2023-04-10 DIAGNOSIS — N184 Chronic kidney disease, stage 4 (severe): Secondary | ICD-10-CM

## 2023-04-10 DIAGNOSIS — F331 Major depressive disorder, recurrent, moderate: Secondary | ICD-10-CM | POA: Diagnosis not present

## 2023-04-10 DIAGNOSIS — J411 Mucopurulent chronic bronchitis: Secondary | ICD-10-CM | POA: Diagnosis not present

## 2023-04-10 LAB — BASIC METABOLIC PANEL
BUN/Creatinine Ratio: 15 (ref 12–28)
BUN: 16 mg/dL (ref 8–27)
CO2: 21 mmol/L (ref 20–29)
Calcium: 9.6 mg/dL (ref 8.7–10.3)
Chloride: 99 mmol/L (ref 96–106)
Creatinine, Ser: 1.08 mg/dL — ABNORMAL HIGH (ref 0.57–1.00)
Glucose: 90 mg/dL (ref 70–99)
Potassium: 5 mmol/L (ref 3.5–5.2)
Sodium: 133 mmol/L — ABNORMAL LOW (ref 134–144)
eGFR: 52 mL/min/{1.73_m2} — ABNORMAL LOW (ref >59)

## 2023-04-10 LAB — CBC
Hematocrit: 37.1 % (ref 34.0–46.6)
Hemoglobin: 12.1 g/dL (ref 11.1–15.9)
MCH: 28.7 pg (ref 26.6–33.0)
MCHC: 32.6 g/dL (ref 31.5–35.7)
MCV: 88 fL (ref 79–97)
Platelets: 308 10*3/uL (ref 150–450)
RBC: 4.22 x10E6/uL (ref 3.77–5.28)
RDW: 12.8 % (ref 11.7–15.4)
WBC: 11.2 10*3/uL — ABNORMAL HIGH (ref 3.4–10.8)

## 2023-04-10 MED ORDER — TRELEGY ELLIPTA 100-62.5-25 MCG/ACT IN AEPB: 1.0000 | INHALATION_SPRAY | Freq: Every day | RESPIRATORY_TRACT | 1 refills | Status: AC

## 2023-04-10 MED ORDER — MIRTAZAPINE 7.5 MG PO TABS: 7.5000 mg | ORAL_TABLET | Freq: Every day | ORAL | 0 refills | Status: DC

## 2023-04-10 NOTE — Patient Instructions (Signed)

## 2023-04-10 NOTE — Progress Notes (Unsigned)
Subjective:  Patient ID: Alexandra House, female    DOB: Dec 22, 1945  Age: 77 y.o. MRN: [id number]  CC: Annual Exam, Hyperlipidemia, and COPD   HPI ALYJAH MILWARD presents for a CPX and f/up ----  Discussed the use of AI scribe software for clinical note transcription with the patient, who gave verbal consent to proceed.  History of Present Illness   The patient, with a history of peripheral artery disease, presents with a persistent cough and chronic coldness in the lower extremities, extending up to the mid-calf. She expresses a desire for an inhaler, suggesting the cough may be associated with shortness of breath or wheezing. The patient is currently on Trelegy, taken once daily.  Additionally, she reports excessive sleep and time spent in bed, though it is unclear if this is due to fatigue or other factors. The patient's spouse passed away approximately 18 months ago, which may contribute to her current state.  The patient also reports a corn or callus on her foot, which she has been self-treating. She denies significant pain in her calves when walking, but expresses concern about the persistent coldness in her feet and legs.       Outpatient Medications Prior to Visit  Medication Sig Dispense Refill   apixaban (ELIQUIS) 5 MG TABS tablet TAKE 1 TABLET TWICE DAILY 180 tablet 2   benzonatate (TESSALON) 100 MG capsule TAKE 1 CAPSULE TWICE DAILY AS NEEDED FOR COUGH 180 capsule 0   EPINEPHrine (EPIPEN JR) 0.15 MG/0.3ML injection Inject 15 mg into the muscle as needed. 2 each 3   ergocalciferol (VITAMIN D2) 50000 units capsule Take 50,000 Units by mouth every Monday.     ezetimibe (ZETIA) 10 MG tablet TAKE 1 TABLET EVERY DAY 90 tablet 3   LORazepam (ATIVAN) 1 MG tablet TAKE 1 TABLET EVERY 8 HOURS AS NEEDED FOR ANXIETY 90 tablet 3   rosuvastatin (CRESTOR) 10 MG tablet TAKE 1 TABLET EVERY DAY 90 tablet 0   Fluticasone-Umeclidin-Vilant (TRELEGY ELLIPTA) 100-62.5-25 MCG/ACT AEPB Inhale 1 puff  into the lungs daily. 1120 each 1   No facility-administered medications prior to visit.    ROS Review of Systems  Constitutional:  Negative for appetite change, diaphoresis, fatigue and unexpected weight change.  HENT: Negative.    Respiratory:  Positive for cough, shortness of breath and wheezing. Negative for chest tightness and stridor.   Cardiovascular:  Negative for chest pain, palpitations and leg swelling.  Gastrointestinal:  Negative for abdominal pain, diarrhea, nausea and vomiting.  Endocrine: Negative.   Genitourinary: Negative.  Negative for difficulty urinating.  Musculoskeletal: Negative.   Skin: Negative.   Neurological: Negative.  Negative for weakness and light-headedness.  Hematological: Negative.  Negative for adenopathy. Does not bruise/bleed easily.  Psychiatric/Behavioral: Negative.      Objective:  BP 138/68 (BP Location: Right Arm, Patient Position: Sitting, Cuff Size: Normal)   Pulse 72   Temp 98.3 F (36.8 C) (Oral)   Ht 4\' 11"  (1.499 m)   Wt 85 lb (38.6 kg)   LMP  (LMP Unknown)   SpO2 97%   BMI 17.17 kg/m   BP Readings from Last 3 Encounters:  04/10/23 138/68  04/09/23 (!) 154/62  10/09/22 138/78    Wt Readings from Last 3 Encounters:  04/10/23 85 lb (38.6 kg)  04/09/23 86 lb (39 kg)  04/04/23 82 lb (37.2 kg)    Physical Exam Vitals reviewed.  HENT:     Nose: Nose normal.     Mouth/Throat:  Mouth: Mucous membranes are moist.  Eyes:     General: No scleral icterus.    Conjunctiva/sclera: Conjunctivae normal.  Cardiovascular:     Rate and Rhythm: Normal rate and regular rhythm.     Pulses:          Dorsalis pedis pulses are 1+ on the right side.       Posterior tibial pulses are 0 on the right side and 0 on the left side.     Heart sounds: No murmur heard.    No friction rub. No gallop.  Pulmonary:     Effort: Pulmonary effort is normal.     Breath sounds: No stridor. No wheezing, rhonchi or rales.  Abdominal:     General:  Abdomen is flat.     Palpations: There is no mass.     Tenderness: There is no abdominal tenderness. There is no guarding.     Hernia: No hernia is present.  Musculoskeletal:        General: Normal range of motion.     Cervical back: Neck supple.     Right lower leg: No edema.     Left lower leg: No edema.  Feet:     Right foot:     Skin integrity: Callus present.     Toenail Condition: Right toenails are normal.     Left foot:     Skin integrity: Skin integrity normal. No callus.     Toenail Condition: Left toenails are normal.  Lymphadenopathy:     Cervical: No cervical adenopathy.  Skin:    General: Skin is dry.  Neurological:     General: No focal deficit present.     Mental Status: She is alert. Mental status is at baseline.  Psychiatric:        Mood and Affect: Mood normal.        Behavior: Behavior normal.     Lab Results  Component Value Date   WBC 11.2 (H) 04/09/2023   HGB 12.1 04/09/2023   HCT 37.1 04/09/2023   PLT 308 04/09/2023   GLUCOSE 90 04/09/2023   CHOL 138 03/23/2022   TRIG 95 03/23/2022   HDL 60 03/23/2022   LDLCALC 60 03/23/2022   ALT 14 10/09/2022   AST 24 10/09/2022   NA 133 (L) 04/09/2023   K 5.0 04/09/2023   CL 99 04/09/2023   CREATININE 1.08 (H) 04/09/2023   BUN 16 04/09/2023   CO2 21 04/09/2023   TSH 3.47 10/09/2022   INR 0.9 08/02/2021   HGBA1C 5.2 08/03/2021    VAS US CAROTID  Result Date: 04/05/2022 Carotid Arterial Duplex Study Patient Name:  CHRISSIE MCBURNEY  Date of Exam:   04/04/2022 Medical Rec #: [id number]        Accession #:    [id number] Date of Birth: 1945/12/13         Patient Gender: F Patient Age:   47 years Exam Location:  [facility] Procedure:      VAS US CAROTID Referring Phys: Arlys John CRENSHAW --------------------------------------------------------------------------------  Indications:       Carotid artery disease and Patient denies cerebrovascular                    symptoms today. Risk Factors:      Hyperlipidemia,  current smoker, prior MI, coronary artery                    disease, PAD. Comparison Study:  Prior carotid duplex exam on  06/23/2018 showed highest                    velocities in right proximal ICA 138/23 cm/s and left                    proximal ICA 112/22 cm/s. Performing Technologist: Carlos American RVT, RDCS (AE), RDMS  Examination Guidelines: A complete evaluation includes B-mode imaging, spectral Doppler, color Doppler, and power Doppler as needed of all accessible portions of each vessel. Bilateral testing is considered an integral part of a complete examination. Limited examinations for reoccurring indications may be performed as noted.  Right Carotid Findings: +----------+--------+--------+--------+---------------------+------------------+           PSV cm/sEDV cm/sStenosisPlaque Description   Comments           +----------+--------+--------+--------+---------------------+------------------+ CCA Prox  107     10                                                      +----------+--------+--------+--------+---------------------+------------------+ CCA Distal117     21      <50%    homogeneous and      tortuous                                             smooth                                  +----------+--------+--------+--------+---------------------+------------------+ ICA Prox  114     18      1-39%                        intimal thickening +----------+--------+--------+--------+---------------------+------------------+ ICA Mid   112     21                                                      +----------+--------+--------+--------+---------------------+------------------+ ICA Distal106     21                                   tortuous           +----------+--------+--------+--------+---------------------+------------------+ ECA       178     11              homogeneous, smooth                                                       and calcific                             +----------+--------+--------+--------+---------------------+------------------+ +----------+--------+-------+----------------+-------------------+           PSV cm/sEDV cmsDescribe        Arm  Pressure (mmHG) +----------+--------+-------+----------------+-------------------+ [id number]            Multiphasic, FAO130                 +----------+--------+-------+----------------+-------------------+ +---------+--------+--+--------+--+---------+ VertebralPSV cm/s95EDV cm/s16Antegrade +---------+--------+--+--------+--+---------+ Right brachial artery, antecubital fossa 42 cm/s biphasic Left Carotid Findings: +----------+--------+--------+--------+---------------------+------------------+           PSV cm/sEDV cm/sStenosisPlaque Description   Comments           +----------+--------+--------+--------+---------------------+------------------+ CCA Prox  80      9                                    intimal thickening +----------+--------+--------+--------+---------------------+------------------+ CCA Distal130     31      <50%    homogeneous and      intimal thickening                                   smooth                                  +----------+--------+--------+--------+---------------------+------------------+ ICA Prox  120     27      1-39%   heterogenous         turbulent flow     +----------+--------+--------+--------+---------------------+------------------+ ICA Mid   112     26                                                      +----------+--------+--------+--------+---------------------+------------------+ ICA Distal128     24                                                      +----------+--------+--------+--------+---------------------+------------------+ ECA       186     11                                                       +----------+--------+--------+--------+---------------------+------------------+ +----------+--------+--------+---------+-------------------+           PSV cm/sEDV cm/sDescribe Arm Pressure (mmHG) +----------+--------+--------+---------+-------------------+ Subclavian202             Turbulent155                 +----------+--------+--------+---------+-------------------+ +---------+--------+--+--------+--+---------+ VertebralPSV cm/s85EDV cm/s25Antegrade +---------+--------+--+--------+--+---------+ Left brachial artery, antecubital fossa 40 cm/s biphasic  Summary: Right Carotid: Velocities in the right ICA are consistent with a 1-39% stenosis.                Non-hemodynamically significant plaque <50% noted in the CCA. Left Carotid: Velocities in the left ICA are consistent with a 1-39% stenosis.               Non-hemodynamically significant plaque <50% noted in the CCA. Vertebrals:  Bilateral vertebral arteries demonstrate antegrade flow. Subclavians: Left subclavian artery flow was disturbed. Normal flow hemodynamics  were seen in the right subclavian artery. *See table(s) above for measurements and observations. Suggest follow up study in 12 months. Electronically signed by Lance Muss MD on 04/05/2022 at 5:08:59 PM.    Final    VAS [place] ABI WITH/WO TBI  Result Date: 04/04/2022  LOWER EXTREMITY DOPPLER STUDY Patient Name:  FERRARI ZEHM  Date of Exam:   04/04/2022 Medical Rec #: [id number]        Accession #:    [id number] Date of Birth: 1946-06-07         Patient Gender: F Patient Age:   37 years Exam Location:  [facility] Procedure:      VAS [place] ABI WITH/WO TBI Referring Phys: Sanda Linger --------------------------------------------------------------------------------  Indications: Patient denies claudication and rest pain symptoms. Her main              complaint is numbness with legs and feet feeling cold all the time. High Risk Factors: Hyperlipidemia, current smoker,  prior MI, coronary artery                    disease.  Comparison Study: Prior ABI 06/23/2018 right .83 left .84 Performing Technologist: Carlos American RVT  Examination Guidelines: A complete evaluation includes at minimum, Doppler waveform signals and systolic blood pressure reading at the level of bilateral brachial, anterior tibial, and posterior tibial arteries, when vessel segments are accessible. Bilateral testing is considered an integral part of a complete examination. Photoelectric Plethysmograph (PPG) waveforms and toe systolic pressure readings are included as required and additional duplex testing as needed. Limited examinations for reoccurring indications may be performed as noted.  ABI Findings: +---------+------------------+-----+--------+--------+ Right    Rt Pressure (mmHg)IndexWaveformComment  +---------+------------------+-----+--------+--------+ Brachial 171                                     +---------+------------------+-----+--------+--------+ PTA      151               0.88 biphasic         +---------+------------------+-----+--------+--------+ PERO     147               0.86 biphasic         +---------+------------------+-----+--------+--------+ DP       146               0.85 biphasic         +---------+------------------+-----+--------+--------+ Great Toe73                0.43 Abnormal         +---------+------------------+-----+--------+--------+ +---------+------------------+-----+--------+-------+ Left     Lt Pressure (mmHg)IndexWaveformComment +---------+------------------+-----+--------+-------+ Brachial 155                                    +---------+------------------+-----+--------+-------+ PTA      152               0.89 biphasic        +---------+------------------+-----+--------+-------+ PERO     146               0.85 biphasic        +---------+------------------+-----+--------+-------+ DP       143                0.84 biphasic        +---------+------------------+-----+--------+-------+ Penelope Coop  0.52 Abnormal        +---------+------------------+-----+--------+-------+ +-------+-----------+-----------+------------+------------+ ABI/TBIToday's ABIToday's TBIPrevious ABIPrevious TBI +-------+-----------+-----------+------------+------------+ Right  .88        .43        .83         .45          +-------+-----------+-----------+------------+------------+ Left   .89        .52        .84         .42          +-------+-----------+-----------+------------+------------+ Bilateral ABIs and TBIs appear slightly increased compared to prior study on 06/23/2018.  Summary: Right: Resting right ankle-brachial index indicates mild right lower extremity arterial disease. The right toe-brachial index is abnormal. Left: Resting left ankle-brachial index indicates mild left lower extremity arterial disease. The left toe-brachial index is abnormal. *See table(s) above for measurements and observations.  Electronically signed by Lance Muss MD on 04/04/2022 at 1:33:01 PM.    Final     DG Chest 2 View  Result Date: 04/16/2023 CLINICAL DATA:  cough EXAM: CHEST - 2 VIEW COMPARISON:  Chest radiograph from 12/06/2020. FINDINGS: Bilateral lungs appear hyperexpanded and hyperlucent with coarse bronchovascular markings, in keeping with COPD. Bilateral lungs otherwise appear clear. No dense consolidation. Redemonstration of biapical pleural caps. Bilateral costophrenic angles are clear. Normal cardio-mediastinal silhouette. No acute osseous abnormalities. The soft tissues are within normal limits. IMPRESSION: 1. COPD. No active cardiopulmonary disease. Electronically Signed   By: Jules Schick M.D.   On: 04/16/2023 10:59     Assessment & Plan:  Chronic renal disease, stage 4, severely decreased glomerular filtration rate (GFR) between 15-29 mL/min/1.73 square meter (HCC)  Mucopurulent chronic  bronchitis (HCC) -     Trelegy Ellipta; Inhale 1 puff into the lungs daily.  Dispense: 120 each; Refill: 1  Chronic cough -     DG Chest 2 View; Future  GAD (generalized anxiety disorder) -     Mirtazapine; Take 1 tablet (7.5 mg total) by mouth at bedtime.  Dispense: 90 tablet; Refill: 0  Moderate episode of recurrent major depressive disorder (HCC) -     Mirtazapine; Take 1 tablet (7.5 mg total) by mouth at bedtime.  Dispense: 90 tablet; Refill: 0  Encounter for general adult medical examination with abnormal findings  Other orders -     Pneumococcal polysaccharide vaccine 23-valent greater than or equal to 2yo subcutaneous/IM     Follow-up: Return in about 6 months (around 10/10/2023).  Sanda Linger, MD

## 2023-04-16 ENCOUNTER — Ambulatory Visit (HOSPITAL_COMMUNITY)
Admission: RE | Admit: 2023-04-16 | Discharge: 2023-04-16 | Disposition: A | Discharge status: Home / Self Care | Payer: Medicare HMO | Source: Ambulatory Visit | Attending: Cardiology | Admitting: Cardiology

## 2023-04-16 DIAGNOSIS — I779 Disorder of arteries and arterioles, unspecified: Secondary | ICD-10-CM | POA: Diagnosis not present

## 2023-04-17 ENCOUNTER — Other Ambulatory Visit: Payer: Self-pay | Admitting: *Deleted

## 2023-04-17 ENCOUNTER — Encounter: Payer: Self-pay | Admitting: *Deleted

## 2023-04-17 ENCOUNTER — Encounter (HOSPITAL_COMMUNITY): Payer: Medicare HMO

## 2023-04-17 DIAGNOSIS — I779 Disorder of arteries and arterioles, unspecified: Secondary | ICD-10-CM

## 2023-05-06 ENCOUNTER — Other Ambulatory Visit: Payer: Self-pay | Admitting: Internal Medicine

## 2023-05-06 DIAGNOSIS — F411 Generalized anxiety disorder: Secondary | ICD-10-CM

## 2023-05-08 ENCOUNTER — Other Ambulatory Visit: Payer: Self-pay

## 2023-05-08 DIAGNOSIS — I480 Paroxysmal atrial fibrillation: Secondary | ICD-10-CM

## 2023-05-08 MED ORDER — APIXABAN 5 MG PO TABS
5.0000 mg | ORAL_TABLET | Freq: Two times a day (BID) | ORAL | 2 refills | Status: DC
Start: 2023-05-08 — End: 2024-04-08

## 2023-05-08 NOTE — Telephone Encounter (Signed)
Prescription refill request for Eliquis received. Indication:afib Last office visit:6/24 Scr:1.08  6/24 Age: 77 Weight:38.6  kg  Prescription refilled

## 2023-05-23 DIAGNOSIS — R059 Cough, unspecified: Secondary | ICD-10-CM | POA: Diagnosis not present

## 2023-05-23 DIAGNOSIS — R519 Headache, unspecified: Secondary | ICD-10-CM | POA: Diagnosis not present

## 2023-05-23 DIAGNOSIS — J441 Chronic obstructive pulmonary disease with (acute) exacerbation: Secondary | ICD-10-CM | POA: Diagnosis not present

## 2023-05-23 DIAGNOSIS — J019 Acute sinusitis, unspecified: Secondary | ICD-10-CM | POA: Diagnosis not present

## 2023-05-31 ENCOUNTER — Other Ambulatory Visit: Payer: Self-pay | Admitting: Internal Medicine

## 2023-05-31 DIAGNOSIS — J411 Mucopurulent chronic bronchitis: Secondary | ICD-10-CM

## 2023-06-25 DIAGNOSIS — I739 Peripheral vascular disease, unspecified: Secondary | ICD-10-CM | POA: Diagnosis not present

## 2023-06-25 DIAGNOSIS — F331 Major depressive disorder, recurrent, moderate: Secondary | ICD-10-CM | POA: Diagnosis not present

## 2023-06-25 DIAGNOSIS — E785 Hyperlipidemia, unspecified: Secondary | ICD-10-CM | POA: Diagnosis not present

## 2023-06-25 DIAGNOSIS — I4891 Unspecified atrial fibrillation: Secondary | ICD-10-CM | POA: Diagnosis not present

## 2023-06-25 DIAGNOSIS — J411 Mucopurulent chronic bronchitis: Secondary | ICD-10-CM | POA: Diagnosis not present

## 2023-06-25 DIAGNOSIS — N1831 Chronic kidney disease, stage 3a: Secondary | ICD-10-CM | POA: Diagnosis not present

## 2023-06-25 DIAGNOSIS — I771 Stricture of artery: Secondary | ICD-10-CM | POA: Diagnosis not present

## 2023-07-10 ENCOUNTER — Other Ambulatory Visit: Payer: Self-pay | Admitting: Internal Medicine

## 2023-07-10 DIAGNOSIS — F411 Generalized anxiety disorder: Secondary | ICD-10-CM

## 2023-07-10 DIAGNOSIS — F331 Major depressive disorder, recurrent, moderate: Secondary | ICD-10-CM

## 2023-08-13 ENCOUNTER — Other Ambulatory Visit: Payer: Self-pay | Admitting: Internal Medicine

## 2023-08-13 DIAGNOSIS — I2510 Atherosclerotic heart disease of native coronary artery without angina pectoris: Secondary | ICD-10-CM

## 2023-08-13 DIAGNOSIS — E785 Hyperlipidemia, unspecified: Secondary | ICD-10-CM

## 2023-08-28 ENCOUNTER — Other Ambulatory Visit: Payer: Self-pay | Admitting: Internal Medicine

## 2023-08-28 DIAGNOSIS — J411 Mucopurulent chronic bronchitis: Secondary | ICD-10-CM

## 2023-10-14 ENCOUNTER — Ambulatory Visit: Payer: Medicare HMO | Admitting: Internal Medicine

## 2023-10-30 ENCOUNTER — Other Ambulatory Visit: Payer: Self-pay | Admitting: Cardiology

## 2023-11-13 ENCOUNTER — Other Ambulatory Visit: Payer: Self-pay | Admitting: Internal Medicine

## 2023-11-13 DIAGNOSIS — E785 Hyperlipidemia, unspecified: Secondary | ICD-10-CM

## 2023-11-13 DIAGNOSIS — I2510 Atherosclerotic heart disease of native coronary artery without angina pectoris: Secondary | ICD-10-CM

## 2023-12-06 ENCOUNTER — Telehealth: Payer: Self-pay

## 2023-12-06 NOTE — Telephone Encounter (Signed)
   Name: SHARENE KRIKORIAN  DOB: [birth year]  MRN: [id number]  Primary Cardiologist: Olga Millers, MD  Chart reviewed as part of pre-operative protocol coverage. Because of Ardra A Seawood's past medical history and time since last visit, she will require a follow-up in-office visit in order to better assess preoperative cardiovascular risk.  Pre-op covering staff: - Please schedule appointment and call patient to inform them. If patient already had an upcoming appointment within acceptable timeframe, please add "pre-op clearance" to the appointment notes so provider is aware. - Please contact requesting surgeon's office via preferred method (i.e, phone, fax) to inform them of need for appointment prior to surgery.  Per office protocol, patient can hold Eliquis for 2 days prior to procedure. Please restart when medically safe to do so.  Patient will not need bridging with Lovenox (enoxaparin) around procedure.    Sharlene Dory, PA-C  12/06/2023, 11:47 AM

## 2023-12-06 NOTE — Telephone Encounter (Signed)
Patient with diagnosis of atrial fibrillation on Eliquis for anticoagulation.    Procedure:  Dental Extraction - Amount of Teeth to be Pulled:  surgical ext #24,25,27,28   Date of Surgery:  Clearance 01/07/24    CHA2DS2-VASc Score = 7   This indicates a 11.2% annual risk of stroke. The patient's score is based upon: CHF History: 1 HTN History: 0 Diabetes History: 0 Stroke History: 2 Vascular Disease History: 1 Age Score: 2 Gender Score: 1   Chart notes TIA 07/2021, resolved before went to hospital  CrCl 25 Platelet count 308  Patient does not require pre-op antibiotics for dental procedure.  Per office protocol, patient can hold Eliquis for 2 days prior to procedure.   Patient will not need bridging with Lovenox (enoxaparin) around procedure.  **This guidance is not considered finalized until pre-operative APP has relayed final recommendations.**

## 2023-12-06 NOTE — Telephone Encounter (Signed)
Called patient to set up an pre-op clearance appt, no answer left a vm

## 2023-12-06 NOTE — Telephone Encounter (Signed)
   Pre-operative Risk Assessment    Patient Name: Alexandra House  DOB: September 15, 1946 MRN: [id number]   Date of last office visit: 04/09/23 Date of next office visit: n/a  Request for Surgical Clearance    Procedure:  Dental Extraction - Amount of Teeth to be Pulled:  surgical ext #24,25,27,28  Date of Surgery:  Clearance 01/07/24                                 Surgeon:  Dr. Algis Liming  Surgeon's Group or Practice Name:  [facility]  Phone number:  [phone] Fax number:  [phone]   Type of Clearance Requested:   - Medical  - Pharmacy:  Hold Apixaban (Eliquis) Not indicated    Type of Anesthesia:  Local with Epinephrine    Additional requests/questions:    [name]   12/06/2023, 10:09 AM

## 2023-12-09 NOTE — Telephone Encounter (Signed)
 I s/w the pt about an appt for preop clearance. Pt tells me she is in [place] and will not be back until around July. I asked if she see's a cardiologist or PCP while her stays in [place]. Pt no she does not. I stated I will need to re-present this back to the preop APP for further recommendations.

## 2023-12-09 NOTE — Telephone Encounter (Signed)
 I s/w the pt and informed her that clearance cannot be given by our office at this time as she is in [place]. The PAC, NP and MD's cannot practice in a state that they are not licensed in. Pt was advised to establish with a cardiologist in [place], especially as she goes down for multiple months at a time. Pt thanked [place] for our help.   I will also let the dental office know they will need to stay in close contact with the pt as she will need to establish with a cardiologist in [place] for the clearance.

## 2024-01-08 NOTE — Telephone Encounter (Signed)
 Dental office would like a c/b in regards to instructions on the pt medical clearance. Please advise

## 2024-01-08 NOTE — Telephone Encounter (Signed)
 Spoke with dental office and advised that pt must establish with a provider in [place] in order to receive guidance on holding Eliquis for tooth extraction.

## 2024-01-14 DIAGNOSIS — R69 Illness, unspecified: Secondary | ICD-10-CM | POA: Diagnosis not present

## 2024-01-22 ENCOUNTER — Other Ambulatory Visit: Payer: Self-pay | Admitting: Internal Medicine

## 2024-01-22 DIAGNOSIS — F331 Major depressive disorder, recurrent, moderate: Secondary | ICD-10-CM

## 2024-01-22 DIAGNOSIS — F411 Generalized anxiety disorder: Secondary | ICD-10-CM

## 2024-02-27 ENCOUNTER — Other Ambulatory Visit: Payer: Self-pay | Admitting: Internal Medicine

## 2024-02-27 DIAGNOSIS — F411 Generalized anxiety disorder: Secondary | ICD-10-CM

## 2024-02-27 DIAGNOSIS — F331 Major depressive disorder, recurrent, moderate: Secondary | ICD-10-CM

## 2024-04-07 DIAGNOSIS — E7849 Other hyperlipidemia: Secondary | ICD-10-CM | POA: Diagnosis not present

## 2024-04-08 ENCOUNTER — Other Ambulatory Visit: Payer: Self-pay | Admitting: Cardiology

## 2024-04-08 ENCOUNTER — Telehealth: Payer: Self-pay

## 2024-04-08 DIAGNOSIS — E785 Hyperlipidemia, unspecified: Secondary | ICD-10-CM | POA: Diagnosis not present

## 2024-04-08 DIAGNOSIS — R7301 Impaired fasting glucose: Secondary | ICD-10-CM | POA: Diagnosis not present

## 2024-04-08 DIAGNOSIS — I480 Paroxysmal atrial fibrillation: Secondary | ICD-10-CM

## 2024-04-08 DIAGNOSIS — N1831 Chronic kidney disease, stage 3a: Secondary | ICD-10-CM | POA: Diagnosis not present

## 2024-04-08 DIAGNOSIS — M810 Age-related osteoporosis without current pathological fracture: Secondary | ICD-10-CM | POA: Diagnosis not present

## 2024-04-08 NOTE — Telephone Encounter (Signed)
 Informed patient's son of Eliquis  dose reduction via VM.

## 2024-04-08 NOTE — Telephone Encounter (Signed)
 Prescription refill request for Eliquis  received. Indication:afib Last office visit:  needs visit Scr:1.08  6/24 Age: 78 Weight:38.6  kg  Under review

## 2024-04-14 DIAGNOSIS — I4891 Unspecified atrial fibrillation: Secondary | ICD-10-CM | POA: Diagnosis not present

## 2024-04-14 DIAGNOSIS — R32 Unspecified urinary incontinence: Secondary | ICD-10-CM | POA: Diagnosis not present

## 2024-04-14 DIAGNOSIS — F331 Major depressive disorder, recurrent, moderate: Secondary | ICD-10-CM | POA: Diagnosis not present

## 2024-04-14 DIAGNOSIS — I340 Nonrheumatic mitral (valve) insufficiency: Secondary | ICD-10-CM | POA: Diagnosis not present

## 2024-04-14 DIAGNOSIS — J411 Mucopurulent chronic bronchitis: Secondary | ICD-10-CM | POA: Diagnosis not present

## 2024-04-14 DIAGNOSIS — R946 Abnormal results of thyroid function studies: Secondary | ICD-10-CM | POA: Diagnosis not present

## 2024-04-14 DIAGNOSIS — Z1339 Encounter for screening examination for other mental health and behavioral disorders: Secondary | ICD-10-CM | POA: Diagnosis not present

## 2024-04-14 DIAGNOSIS — N1831 Chronic kidney disease, stage 3a: Secondary | ICD-10-CM | POA: Diagnosis not present

## 2024-04-14 DIAGNOSIS — I739 Peripheral vascular disease, unspecified: Secondary | ICD-10-CM | POA: Diagnosis not present

## 2024-04-14 DIAGNOSIS — E785 Hyperlipidemia, unspecified: Secondary | ICD-10-CM | POA: Diagnosis not present

## 2024-04-14 DIAGNOSIS — Z1331 Encounter for screening for depression: Secondary | ICD-10-CM | POA: Diagnosis not present

## 2024-04-14 DIAGNOSIS — Z0000 Encounter for general adult medical examination without abnormal findings: Secondary | ICD-10-CM | POA: Diagnosis not present

## 2024-05-16 ENCOUNTER — Other Ambulatory Visit: Payer: Self-pay | Admitting: Cardiology

## 2024-06-11 DIAGNOSIS — I4891 Unspecified atrial fibrillation: Secondary | ICD-10-CM | POA: Diagnosis not present

## 2024-06-11 DIAGNOSIS — M549 Dorsalgia, unspecified: Secondary | ICD-10-CM | POA: Diagnosis not present

## 2024-06-11 DIAGNOSIS — R350 Frequency of micturition: Secondary | ICD-10-CM | POA: Diagnosis not present

## 2024-06-11 DIAGNOSIS — N390 Urinary tract infection, site not specified: Secondary | ICD-10-CM | POA: Diagnosis not present

## 2024-06-11 DIAGNOSIS — J449 Chronic obstructive pulmonary disease, unspecified: Secondary | ICD-10-CM | POA: Diagnosis not present

## 2024-06-30 ENCOUNTER — Other Ambulatory Visit: Payer: Self-pay | Admitting: Cardiology

## 2024-06-30 DIAGNOSIS — I480 Paroxysmal atrial fibrillation: Secondary | ICD-10-CM

## 2024-07-01 DIAGNOSIS — M479 Spondylosis, unspecified: Secondary | ICD-10-CM | POA: Diagnosis not present

## 2024-07-01 DIAGNOSIS — M419 Scoliosis, unspecified: Secondary | ICD-10-CM | POA: Diagnosis not present

## 2024-07-01 NOTE — Telephone Encounter (Signed)
 Prescription refill request for Eliquis  received. Indication: PAF Last office visit: 04/09/23  KATHEE Shallow MD Scr: 1.08 on 04/09/23  1.08  Epic Age: 78 Weight: 39kg  Pt is taking Eliquis  5mg  twice daily.  Based on above findings Eliquis  2.5mg  twice daily is the appropriate dose.  Message sent to PharmD Pool to advise on dose adjustment.  Labs are past due .  Requested they be done at upcoming appt 07/16/24 with Dr Shallow.

## 2024-07-01 NOTE — Telephone Encounter (Signed)
 Called pt.  No answer.  Left message regarding Eliquis  dose reduction.  Informed pt to call back if she has any questions.  New Rx for Eliquis  2.5mg  tablets sent to pharmacy.

## 2024-07-07 DIAGNOSIS — M419 Scoliosis, unspecified: Secondary | ICD-10-CM | POA: Diagnosis not present

## 2024-07-07 DIAGNOSIS — M479 Spondylosis, unspecified: Secondary | ICD-10-CM | POA: Diagnosis not present

## 2024-07-13 DIAGNOSIS — M479 Spondylosis, unspecified: Secondary | ICD-10-CM | POA: Diagnosis not present

## 2024-07-13 DIAGNOSIS — M419 Scoliosis, unspecified: Secondary | ICD-10-CM | POA: Diagnosis not present

## 2024-07-13 NOTE — Progress Notes (Unsigned)
 HPI: FU cardiomyopathy and atrial fibrillation. Myoview  in Nov 2013 demonstrated no ischemia and an EF of 81%. She had a low-level exercise Lexiscan  study and did develop significant ST abnormalities consistent with ischemia and transient complete heart block with infusion. She has been treated medically with low threshold for cath if recurrent symptoms. Had prolonged hospitalization July 2018 secondary to respiratory failure felt secondary to emphysema and aspiration pneumonia.  She had elevated enzymes with troponin of 4.39.  Ischemia evaluation recommended as outpatient. Continued medical therapy versus catheterization discussed with patient and she preferred conservative management. Nuclear study September 2019 showed ejection fraction 45% and no ischemia or infarction. Echocardiogram October 2022 showed normal LV function, grade 1 diastolic dysfunction, mild to moderate left atrial enlargement, mild to moderate mitral regurgitation, trace aortic insufficiency.  CTA October 2022 showed left subclavian stenosis.  Prior to that admission she had had difficulties with speech.  She was discharged with a monitor that showed paroxysmal atrial fibrillation. ABIs June 2023 mild on the right with abnormal toe brachial index and mild on the left with abnormal toe-brachial index.  Carotid Dopplers July 2024 showed 1 to 39% right and left stenosis.  Since she was last seen patient denies dyspnea, chest pain, palpitations or syncope.  Current Outpatient Medications  Medication Sig Dispense Refill   apixaban  (ELIQUIS ) 2.5 MG TABS tablet Take 1 tablet (2.5 mg total) by mouth 2 (two) times daily. 180 tablet 1   apixaban  (ELIQUIS ) 2.5 MG TABS tablet Take 1 tablet (2.5 mg total) by mouth 2 (two) times daily. 60 tablet 5   benzonatate  (TESSALON ) 100 MG capsule TAKE 1 CAPSULE TWICE DAILY AS NEEDED FOR COUGH 180 capsule 0   EPINEPHrine  (EPIPEN  JR) 0.15 MG/0.3ML injection Inject 15 mg into the muscle as needed. 2 each  3   ergocalciferol  (VITAMIN D2) 50000 units capsule Take 50,000 Units by mouth every Monday.     ezetimibe  (ZETIA ) 10 MG tablet TAKE 1 TABLET EVERY DAY 90 tablet 0   Fluticasone -Umeclidin-Vilant (TRELEGY ELLIPTA ) 100-62.5-25 MCG/ACT AEPB Inhale 1 puff into the lungs daily. 120 each 1   LORazepam  (ATIVAN ) 1 MG tablet TAKE 1 TABLET EVERY 8 HOURS AS NEEDED FOR ANXIETY 90 tablet 5   mirtazapine  (REMERON ) 7.5 MG tablet Take 1 tablet (7.5 mg total) by mouth at bedtime. Schedule an appt for further refills 30 tablet 0   rosuvastatin  (CRESTOR ) 10 MG tablet TAKE 1 TABLET EVERY DAY 90 tablet 3   No current facility-administered medications for this visit.     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 Echo 12/12: EF 50-55%; b. Echo 11/13:  mild LVH, EF 60-65%; c. 04/2017 Echo: EF 45-50%; d. 06/2018 Echo EF 55-65%; e. 07/2021 Echo: EF 60-65%, no rwma, GrI DD, mild-mod LAD. Mild-mod MR.   Acute on chronic respiratory failure (HCC)    Anxiety    Caloric malnutrition    Carotid stenosis    Coronary artery disease    Fibromyalgia    HLD (hyperlipidemia)    Hx of cardiovascular stress test    a. Lex MV 11/13: + significant ECG changes and CHB with infusion; EF 81%, no ischemia; b. 06/2018 MV: EF 45% (55-60% visually). No ischemia/infarct-->Low risk.   Hypertension    Neuropathy    Osteoporosis    Stenosis of left subclavian artery    Syncope 08/2009   a. ETT-Echo 1/11:  poor ex tol, submax exercise,  no WMA or ECG changes c/w ischemia;     TIA (transient ischemic attack) 07/2021    Past Surgical History:  Procedure Laterality Date   None      Social History   Socioeconomic History   Marital status: Married    Spouse name: Not on file   Number of children: 2   Years of education: Not on file   Highest education level: Not on file  Occupational History   Not on file  Tobacco Use   Smoking status: Every Day    Current  packs/day: 0.50    Types: Cigarettes   Smokeless tobacco: Current  Vaping Use   Vaping status: Never Used  Substance and Sexual Activity   Alcohol  use: Yes    Comment: glass wine once every 6 mos. per patient   Drug use: No   Sexual activity: Not on file  Other Topics Concern   Not on file  Social History Narrative   Lives with husband   Social Drivers of Health   Financial Resource Strain: Low Risk  (04/04/2023)   Overall Financial Resource Strain (CARDIA)    Difficulty of Paying Living Expenses: Not hard at all  Food Insecurity: No Food Insecurity (04/04/2023)   Hunger Vital Sign    Worried About Running Out of Food in the Last Year: Never true    Ran Out of Food in the Last Year: Never true  Transportation Needs: No Transportation Needs (04/04/2023)   PRAPARE - Administrator, Civil Service (Medical): No    Lack of Transportation (Non-Medical): No  Physical Activity: Inactive (04/04/2023)   Exercise Vital Sign    Days of Exercise per Week: 0 days    Minutes of Exercise per Session: 0 min  Stress: No Stress Concern Present (04/04/2023)   [employer] of Occupational Health - Occupational Stress Questionnaire    Feeling of Stress : Not at all  Social Connections: Moderately Isolated (04/04/2023)   Social Connection and Isolation Panel    Frequency of Communication with Friends and Family: Three times a week    Frequency of Social Gatherings with Friends and Family: Three times a week    Attends Religious Services: More than 4 times per year    Active Member of Clubs or Organizations: No    Attends Banker Meetings: Never    Marital Status: Widowed  Intimate Partner Violence: Not At Risk (04/04/2023)   Humiliation, Afraid, Rape, and Kick questionnaire    Fear of Current or Ex-Partner: No    Emotionally Abused: No    Physically Abused: No    Sexually Abused: No    Family History  Problem Relation Age of Onset   CAD Mother 76       Status post  PCI   Breast cancer Mother    Lung cancer Father    Colon cancer Neg Hx     ROS: no fevers or chills, productive cough, hemoptysis, dysphasia, odynophagia, melena, hematochezia, dysuria, hematuria, rash, seizure activity, orthopnea, PND, pedal edema, claudication. Remaining systems are negative.  Physical Exam: Well-developed well-nourished in no acute distress.  Skin is warm and dry.  HEENT is normal.  Neck is supple.  Chest is clear to auscultation with normal expansion.  Cardiovascular exam is regular rate and rhythm.  Abdominal exam nontender or distended. No masses palpated. Extremities show no edema. neuro grossly intact  EKG Interpretation Date/Time:  Thursday July 16 2024 13:06:24 EDT Ventricular Rate:  61 PR Interval:  QRS Duration:  74 QT Interval:  412 QTC Calculation: 414 R Axis:   82  Text Interpretation: Sinus with PACs ST & T wave abnormality, consider inferior ischemia ST & T wave abnormality, consider anterolateral ischemia Confirmed by Pietro Rogue ([id number]) on 07/16/2024 1:14:18 PM    A/P  1 paroxysmal atrial fibrillation-patient is in sinus rhythm.  She is not on AV nodal blocking agents due to history of bradycardia.  Continue apixaban .  Check hemoglobin and renal function.  2 mitral regurgitation-mild to moderate on most recent echocardiogram.  Will repeat study.  3 history of cardiomyopathy-LV function has improved on most recent echocardiogram.  4 hyperlipidemia-continue Zetia  and Crestor .  5 tobacco abuse-patient counseled on discontinuing.  6 history of left subclavian stenosis-noted on previous CTA.  No symptoms.  Continue statin.  Rogue Pietro, MD

## 2024-07-16 ENCOUNTER — Encounter: Payer: Self-pay | Admitting: Cardiology

## 2024-07-16 ENCOUNTER — Ambulatory Visit: Attending: Cardiology | Admitting: Cardiology

## 2024-07-16 VITALS — BP 145/55 | HR 61 | Ht 59.0 in | Wt 82.0 lb

## 2024-07-16 DIAGNOSIS — I480 Paroxysmal atrial fibrillation: Secondary | ICD-10-CM

## 2024-07-16 DIAGNOSIS — I059 Rheumatic mitral valve disease, unspecified: Secondary | ICD-10-CM | POA: Diagnosis not present

## 2024-07-16 NOTE — Patient Instructions (Signed)
  Testing/Procedures:  Your physician has requested that you have an echocardiogram. Echocardiography is a painless test that uses sound waves to create images of your heart. It provides your doctor with information about the size and shape of your heart and how well your heart's chambers and valves are working. This procedure takes approximately one hour. There are no restrictions for this procedure. Please do NOT wear cologne, perfume, aftershave, or lotions (deodorant is allowed). Please arrive 15 minutes prior to your appointment time.  Please note: We ask at that you not bring children with you during ultrasound (echo/ vascular) testing. Due to room size and safety concerns, children are not allowed in the ultrasound rooms during exams. Our front office staff cannot provide observation of children in our lobby area while testing is being conducted. An adult accompanying a patient to their appointment will only be allowed in the ultrasound room at the discretion of the ultrasound technician under special circumstances. We apologize for any inconvenience. [address]  Follow-Up: At [facility], you and your health needs are our priority.  As part of our continuing mission to provide you with exceptional heart care, our providers are all part of one team.  This team includes your primary Cardiologist (physician) and Advanced Practice Providers or APPs (Physician Assistants and Nurse Practitioners) who all work together to provide you with the care you need, when you need it.  Your next appointment:   6 month(s)  Provider:   Redell Shallow, MD

## 2024-07-17 ENCOUNTER — Ambulatory Visit: Payer: Self-pay | Admitting: Cardiology

## 2024-07-17 LAB — BASIC METABOLIC PANEL WITH GFR
BUN/Creatinine Ratio: 14 (ref 12–28)
BUN: 16 mg/dL (ref 8–27)
CO2: 19 mmol/L — AB (ref 20–29)
Calcium: 9.4 mg/dL (ref 8.7–10.3)
Chloride: 100 mmol/L (ref 96–106)
Creatinine, Ser: 1.13 mg/dL — AB (ref 0.57–1.00)
Glucose: 86 mg/dL (ref 70–99)
Potassium: 4.8 mmol/L (ref 3.5–5.2)
Sodium: 136 mmol/L (ref 134–144)
eGFR: 49 mL/min/1.73 — AB (ref >59)

## 2024-07-17 LAB — CBC
Hematocrit: 37.3 % (ref 34.0–46.6)
Hemoglobin: 11.9 g/dL (ref 11.1–15.9)
MCH: 29.4 pg (ref 26.6–33.0)
MCHC: 31.9 g/dL (ref 31.5–35.7)
MCV: 92 fL (ref 79–97)
Platelets: 310 x10E3/uL (ref 150–450)
RBC: 4.05 x10E6/uL (ref 3.77–5.28)
RDW: 13.2 % (ref 11.7–15.4)
WBC: 11.9 x10E3/uL — ABNORMAL HIGH (ref 3.4–10.8)

## 2024-07-21 DIAGNOSIS — M419 Scoliosis, unspecified: Secondary | ICD-10-CM | POA: Diagnosis not present

## 2024-07-21 DIAGNOSIS — M479 Spondylosis, unspecified: Secondary | ICD-10-CM | POA: Diagnosis not present

## 2024-07-22 NOTE — Telephone Encounter (Signed)
 Letter of results sent to pt

## 2024-07-28 DIAGNOSIS — M419 Scoliosis, unspecified: Secondary | ICD-10-CM | POA: Diagnosis not present

## 2024-07-28 DIAGNOSIS — M479 Spondylosis, unspecified: Secondary | ICD-10-CM | POA: Diagnosis not present

## 2024-08-01 ENCOUNTER — Other Ambulatory Visit: Payer: Self-pay | Admitting: Cardiology

## 2024-08-05 DIAGNOSIS — I739 Peripheral vascular disease, unspecified: Secondary | ICD-10-CM | POA: Diagnosis not present

## 2024-08-05 DIAGNOSIS — M810 Age-related osteoporosis without current pathological fracture: Secondary | ICD-10-CM | POA: Diagnosis not present

## 2024-08-05 DIAGNOSIS — F411 Generalized anxiety disorder: Secondary | ICD-10-CM | POA: Diagnosis not present

## 2024-08-05 DIAGNOSIS — I099 Rheumatic heart disease, unspecified: Secondary | ICD-10-CM | POA: Diagnosis not present

## 2024-08-05 DIAGNOSIS — Z7901 Long term (current) use of anticoagulants: Secondary | ICD-10-CM | POA: Diagnosis not present

## 2024-08-05 DIAGNOSIS — I509 Heart failure, unspecified: Secondary | ICD-10-CM | POA: Diagnosis not present

## 2024-08-05 DIAGNOSIS — I130 Hypertensive heart and chronic kidney disease with heart failure and stage 1 through stage 4 chronic kidney disease, or unspecified chronic kidney disease: Secondary | ICD-10-CM | POA: Diagnosis not present

## 2024-08-05 DIAGNOSIS — I442 Atrioventricular block, complete: Secondary | ICD-10-CM | POA: Diagnosis not present

## 2024-08-05 DIAGNOSIS — I349 Nonrheumatic mitral valve disorder, unspecified: Secondary | ICD-10-CM | POA: Diagnosis not present

## 2024-08-05 DIAGNOSIS — R636 Underweight: Secondary | ICD-10-CM | POA: Diagnosis not present

## 2024-08-05 DIAGNOSIS — J4489 Other specified chronic obstructive pulmonary disease: Secondary | ICD-10-CM | POA: Diagnosis not present

## 2024-08-05 DIAGNOSIS — D6869 Other thrombophilia: Secondary | ICD-10-CM | POA: Diagnosis not present

## 2024-08-05 DIAGNOSIS — E785 Hyperlipidemia, unspecified: Secondary | ICD-10-CM | POA: Diagnosis not present

## 2024-08-05 DIAGNOSIS — N1831 Chronic kidney disease, stage 3a: Secondary | ICD-10-CM | POA: Diagnosis not present

## 2024-08-05 DIAGNOSIS — Z9181 History of falling: Secondary | ICD-10-CM | POA: Diagnosis not present

## 2024-08-05 DIAGNOSIS — I4891 Unspecified atrial fibrillation: Secondary | ICD-10-CM | POA: Diagnosis not present

## 2024-08-05 DIAGNOSIS — F325 Major depressive disorder, single episode, in full remission: Secondary | ICD-10-CM | POA: Diagnosis not present

## 2024-08-05 DIAGNOSIS — I679 Cerebrovascular disease, unspecified: Secondary | ICD-10-CM | POA: Diagnosis not present

## 2024-08-26 ENCOUNTER — Ambulatory Visit (HOSPITAL_COMMUNITY)
Admission: RE | Admit: 2024-08-26 | Discharge: 2024-08-26 | Disposition: A | Discharge status: Home / Self Care | Source: Ambulatory Visit | Attending: Internal Medicine | Admitting: Internal Medicine

## 2024-08-26 DIAGNOSIS — I059 Rheumatic mitral valve disease, unspecified: Secondary | ICD-10-CM | POA: Diagnosis not present

## 2024-08-26 LAB — ECHOCARDIOGRAM COMPLETE
Area-P 1/2: 3.2 cm2
S' Lateral: 2.7 cm

## 2024-09-07 ENCOUNTER — Encounter: Payer: Self-pay | Admitting: *Deleted

## 2024-09-13 ENCOUNTER — Other Ambulatory Visit: Payer: Self-pay | Admitting: Family

## 2024-09-13 DIAGNOSIS — I2510 Atherosclerotic heart disease of native coronary artery without angina pectoris: Secondary | ICD-10-CM

## 2024-09-13 DIAGNOSIS — E785 Hyperlipidemia, unspecified: Secondary | ICD-10-CM

## 2024-10-29 ENCOUNTER — Other Ambulatory Visit: Payer: Self-pay | Admitting: Internal Medicine

## 2024-10-29 ENCOUNTER — Other Ambulatory Visit: Payer: Self-pay | Admitting: Physician Assistant

## 2024-10-29 DIAGNOSIS — J411 Mucopurulent chronic bronchitis: Secondary | ICD-10-CM

## 2024-11-01 NOTE — Progress Notes (Unsigned)
 "  Cardiology Office Note    Date:  11/02/2024  ID:  Alexandra House, DOB Apr 28, 1946, MRN [id number] PCP:  Larnell Hamilton, MD  Cardiologist:  Redell Shallow, MD  Electrophysiologist:  None   Chief Complaint: Follow up for PAF   History of Present Illness: .    Alexandra House is a 79 y.o. female with visit-pertinent history of paroxysmal atrial fibrillation, mitral regurgitation, history of cardiomyopathy, hyperlipidemia, tobacco abuse and history of left subclavian stenosis.  Myoview  in 08/2012 demonstrated no ischemia and EF of 81% she previously had a low-level exercise Lexiscan  study and developed significant ST abnormalities consistent with ischemia and transient complete heart block with infusion, she has been treated medically with low threshold for cath if recurrent symptoms.  In July 2018 she had a prolonged hospitalization secondary to respiratory failure felt secondary to emphysema and aspiration pneumonia.  Patient had elevated enzymes with troponin of 4.39, ischemic evaluation recommended as outpatient.  Continued medical therapy versus catheterization was discussed with patient and she preferred conservative management.  Nuclear study in September 2019 showed EF 45% and no ischemia or infarction.  Echo in October 2022 showed normal LV function, G1 DD, mild to moderate left atrial enlargement, mild to moderate mitral regurgitation, trace aortic insufficiency.  CTA in October 2022 showed left subclavian stenosis.  During her previous admission she had difficulty with speech and was discharged with a cardiac monitor that showed paroxysmal atrial fibrillation.  Carotid Dopplers in July 2024 showed 1 to 39% right and left stenosis.  Patient was last seen in clinic by Dr. Shallow on 07/16/2024.  She had remained stable from a cardiac standpoint.  Echocardiogram was ordered for evaluation of mitral valve regurgitation.  Echocardiogram on 08/26/2024 indicated LVEF 60 to 65%, no RWMA,  diastolic primaries were normal, RV systolic function and size was normal, mild to moderate mitral regurgitation, no evidence of stenosis, aortic valve regurgitation not visualized, aortic valve sclerosis is present without evidence of stenosis.  Today she presents for follow up. She reports that she has been doing okay overall.  She denies any chest pain, shortness of breath, notes some intermittent ankle edema that has been stable, denies palpitations, presyncope or syncope.  Patient notes that she is not terribly active, reports that she lays in bed and watches TV frequently.  She is planning to go to [place]  next week, is planning to walk with Silver sneakers there, notes that she is not terribly active with cold weather.  She denies any cardiac concerns or complaints today.  Labwork independently reviewed: 07/16/2024: Hemoglobin 11.9, hematocrit 37.3, sodium 136, potassium 4.8, creatinine 1.13 ROS: .   Today she denies chest pain, shortness of breath, lower extremity edema, fatigue, palpitations, melena, hematuria, hemoptysis, diaphoresis, presyncope, syncope, orthopnea, and PND.  All other systems are reviewed and otherwise negative. Studies Reviewed: SABRA   EKG:  EKG is not ordered today.  CV Studies: Cardiac studies reviewed are outlined and summarized above. Otherwise please see EMR for full report. Cardiac Studies & Procedures   ______________________________________________________________________________________________   STRESS TESTS  MYOCARDIAL PERFUSION IMAGING 07/03/2018  Interpretation Summary  Nuclear stress EF: 45%. Visually the EF is normal - around 55-60%.  There was no ST segment deviation noted during stress.  The study is normal. no evidence of ischemia or previous infarction  This is a low risk study.   ECHOCARDIOGRAM  ECHOCARDIOGRAM COMPLETE 08/26/2024  Narrative ECHOCARDIOGRAM REPORT    Patient Name:   Alexandra House Date of Exam:  08/26/2024 Medical Rec  #:  [id number]       Height:       59.0 in Accession #:    [id number]      Weight:       82.0 lb Date of Birth:  06-Mar-1946        BSA:          1.263 m Patient Age:    78 years        BP:           145/55 mmHg Patient Gender: F               HR:           71 bpm. Exam Location:  [address]  Procedure: 2D Echo, Cardiac Doppler and Color Doppler (Both Spectral and Color Flow Doppler were utilized during procedure).  Indications:    I05.9 MV disorder  History:        Patient has prior history of Echocardiogram examinations, most recent 08/03/2021. CHF, TIA, MV disorder, Arrythmias:Paroxysmal atrial fibrillation; Risk Factors:Hypertension, Dyslipidemia and Current Smoker.  Sonographer:    Elsie Bohr RDCS Referring Phys: [id number] BRIAN S CRENSHAW  IMPRESSIONS   1. Left ventricular ejection fraction, by estimation, is 60 to 65%. The left ventricle has normal function. The left ventricle has no regional wall motion abnormalities. Left ventricular diastolic parameters were normal. 2. Right ventricular systolic function is normal. The right ventricular size is normal. 3. The mitral valve is normal in structure. Mild to moderate mitral valve regurgitation. No evidence of mitral stenosis. 4. The aortic valve is normal in structure. Aortic valve regurgitation is not visualized. Aortic valve sclerosis/calcification is present, without any evidence of aortic stenosis. 5. The inferior vena cava is normal in size with greater than 50% respiratory variability, suggesting right atrial pressure of 3 mmHg.  Comparison(s): No significant change from prior study.  FINDINGS Left Ventricle: Left ventricular ejection fraction, by estimation, is 60 to 65%. The left ventricle has normal function. The left ventricle has no regional wall motion abnormalities. The left ventricular internal cavity size was normal in size. There is no left ventricular hypertrophy. Left ventricular diastolic parameters were  normal.  Right Ventricle: The right ventricular size is normal. No increase in right ventricular wall thickness. Right ventricular systolic function is normal.  Left Atrium: Left atrial size was normal in size.  Right Atrium: Right atrial size was normal in size.  Pericardium: There is no evidence of pericardial effusion.  Mitral Valve: The mitral valve is normal in structure. Mild to moderate mitral valve regurgitation. No evidence of mitral valve stenosis.  Tricuspid Valve: The tricuspid valve is normal in structure. Tricuspid valve regurgitation is trivial. No evidence of tricuspid stenosis.  Aortic Valve: The aortic valve is normal in structure. Aortic valve regurgitation is not visualized. Aortic valve sclerosis/calcification is present, without any evidence of aortic stenosis.  Pulmonic Valve: The pulmonic valve was not well visualized. Pulmonic valve regurgitation is mild. No evidence of pulmonic stenosis.  Aorta: The aortic root is normal in size and structure.  Venous: The inferior vena cava is normal in size with greater than 50% respiratory variability, suggesting right atrial pressure of 3 mmHg.  IAS/Shunts: No atrial level shunt detected by color flow Doppler.   LEFT VENTRICLE PLAX 2D LVIDd:         3.80 cm   Diastology LVIDs:         2.70 cm   LV e' medial:  6.74 cm/s LV PW:         1.00 cm   LV E/e' medial:  19.3 LV IVS:        1.20 cm   LV e' lateral:   7.83 cm/s LVOT diam:     1.80 cm   LV E/e' lateral: 16.6 LV SV:         36 LV SV Index:   29 LVOT Area:     2.54 cm   RIGHT VENTRICLE             IVC RV S prime:     11.30 cm/s  IVC diam: 1.20 cm TAPSE (M-mode): 1.8 cm RVSP:           28.6 mmHg  LEFT ATRIUM             Index        RIGHT ATRIUM           Index LA diam:        3.20 cm 2.53 cm/m   RA Pressure: 3.00 mmHg LA Vol (A2C):   29.1 ml 23.05 ml/m  RA Area:     5.59 cm LA Vol (A4C):   29.5 ml 23.36 ml/m  RA Volume:   8.87 ml   7.02 ml/m LA  Biplane Vol: 30.1 ml 23.84 ml/m AORTIC VALVE LVOT Vmax:   62.20 cm/s LVOT Vmean:  42.000 cm/s LVOT VTI:    0.143 m  AORTA Ao Root diam: 2.60 cm Ao Asc diam:  2.70 cm  MITRAL VALVE                TRICUSPID VALVE MV Area (PHT): 3.20 cm     TR Peak grad:   25.6 mmHg MV Decel Time: 237 msec     TR Vmax:        253.00 cm/s MV E velocity: 130.00 cm/s  Estimated RAP:  3.00 mmHg MV A velocity: 106.00 cm/s  RVSP:           28.6 mmHg MV E/A ratio:  1.23 SHUNTS Systemic VTI:  0.14 m Systemic Diam: 1.80 cm  Joelle Azobou Tonleu Electronically signed by Joelle Cedars Tonleu Signature Date/Time: 08/26/2024/3:50:13 PM    Final    MONITORS  CARDIAC EVENT MONITOR 10/02/2021  Narrative Sinus bradycardia, NSR, sinus tachycardia, PACs, paroxysmal atrial fibrillation, transient complete heart block. Redell Shallow       ______________________________________________________________________________________________       Current Reported Medications:.    Active Medications[1]  Physical Exam:    VS:  BP (!) 116/52   Pulse 76   Ht 4' 11 (1.499 m)   Wt 79 lb 6.4 oz (36 kg)   LMP  (LMP Unknown)   SpO2 99%   BMI 16.04 kg/m    Wt Readings from Last 3 Encounters:  11/02/24 79 lb 6.4 oz (36 kg)  07/16/24 82 lb (37.2 kg)  04/10/23 85 lb (38.6 kg)    GEN: Well nourished, well developed in no acute distress NECK: No JVD; No carotid bruits CARDIAC: RRR, no murmurs, rubs, gallops RESPIRATORY:  Clear to auscultation without rales, wheezing or rhonchi  ABDOMEN: Soft, non-tender, non-distended EXTREMITIES:  No edema; No acute deformity     Asessement and Plan:.    Paroxysmal atrial fibrillation: Remains in sinus rhythm on auscultation. Patient is not on AV nodal blocking agents due to history of bradycardia.  Patient reports that she has been taking Eliquis  only once daily, discussed with patient's increased risk for stroke  given history of paroxysmal atrial fibrillation.  Stressed  with patient the need to take Eliquis  twice daily as prescribed, patient verbalized understanding.  She denies any bleeding problems on Eliquis .  Reviewed ED precautions.  Patient reports that she had labwork completed recently by PCP, will request.  Continue Eliquis  2.5 mg twice daily.  CAD: Prior CT angio of the chest showed mild coronary atherosclerosis of the LAD.  Stable with no anginal symptoms. No indication for ischemic evaluation.  Heart healthy diet and regular cardiovascular exercise encouraged.  Reviewed ED precautions.  No ASA given need for Eliquis .  Mitral regurgitation: Echo in 08/26/2024 indicated LVEF 60-65%, mild to moderate mitral valve regurgitation, no evidence of stenosis.  She denies any increased shortness of breath, orthopnea or PND.  History of cardiomyopathy: On prior nuclear study in 2019 EF was 45%, no evidence of ischemia or infarction.  Echo in 11/25 indicated LVEF 60-65%.  She appears euvolemic and well compensated on exam.  Hyperlipidemia: Continue Crestor  and Zetia .   Tobacco abuse: Smokes a pack a day, cessation encouraged.   Carotid artery disease/history of left subclavian stenosis: Carotid Dopplers in July 2024 showed 1 to 39% right and left stenosis.  Patient previously noted to have left subclavian stenosis on CTA, denies symptoms.  Will plan to check carotid dopplers on follow up as she is leaving for [place] .    Disposition: F/u with Dr. Pietro or Bernard Donahoo, NP in 5 months per patient request.   Signed, Terence Googe D Taran Hable, NP       [1]  Current Meds  Medication Sig   EPINEPHrine  (EPIPEN  JR) 0.15 MG/0.3ML injection Inject 15 mg into the muscle as needed.   LORazepam  (ATIVAN ) 1 MG tablet TAKE 1 TABLET EVERY 8 HOURS AS NEEDED FOR ANXIETY   [DISCONTINUED] apixaban  (ELIQUIS ) 2.5 MG TABS tablet Take 1 tablet (2.5 mg total) by mouth 2 (two) times daily.   [DISCONTINUED] apixaban  (ELIQUIS ) 2.5 MG TABS tablet Take 1 tablet (2.5 mg total) by mouth 2 (two)  times daily.   [DISCONTINUED] ezetimibe  (ZETIA ) 10 MG tablet TAKE 1 TABLET EVERY DAY   "

## 2024-11-02 ENCOUNTER — Encounter: Payer: Self-pay | Admitting: Cardiology

## 2024-11-02 ENCOUNTER — Ambulatory Visit: Admitting: Cardiology

## 2024-11-02 VITALS — BP 116/52 | HR 76 | Ht 59.0 in | Wt 79.4 lb

## 2024-11-02 DIAGNOSIS — E7800 Pure hypercholesterolemia, unspecified: Secondary | ICD-10-CM

## 2024-11-02 DIAGNOSIS — I480 Paroxysmal atrial fibrillation: Secondary | ICD-10-CM | POA: Diagnosis not present

## 2024-11-02 DIAGNOSIS — I349 Nonrheumatic mitral valve disorder, unspecified: Secondary | ICD-10-CM

## 2024-11-02 DIAGNOSIS — E785 Hyperlipidemia, unspecified: Secondary | ICD-10-CM

## 2024-11-02 DIAGNOSIS — I059 Rheumatic mitral valve disease, unspecified: Secondary | ICD-10-CM

## 2024-11-02 DIAGNOSIS — I779 Disorder of arteries and arterioles, unspecified: Secondary | ICD-10-CM | POA: Diagnosis not present

## 2024-11-02 DIAGNOSIS — I2510 Atherosclerotic heart disease of native coronary artery without angina pectoris: Secondary | ICD-10-CM

## 2024-11-02 DIAGNOSIS — Z720 Tobacco use: Secondary | ICD-10-CM | POA: Diagnosis not present

## 2024-11-02 MED ORDER — EZETIMIBE 10 MG PO TABS: 10.0000 mg | ORAL_TABLET | Freq: Every day | ORAL | 3 refills | Status: AC

## 2024-11-02 MED ORDER — ROSUVASTATIN CALCIUM 10 MG PO TABS: 10.0000 mg | ORAL_TABLET | Freq: Every day | ORAL | 3 refills | Status: AC

## 2024-11-02 MED ORDER — APIXABAN 2.5 MG PO TABS: 2.5000 mg | ORAL_TABLET | Freq: Two times a day (BID) | ORAL | 3 refills | Status: AC

## 2024-11-02 NOTE — Patient Instructions (Signed)
 Medication Instructions:  Your physician recommends that you continue on your current medications as directed. Please refer to the Current Medication list given to you today.  *If you need a refill on your cardiac medications before your next appointment, please call your pharmacy*  Lab Work: NONE If you have labs (blood work) drawn today and your tests are completely normal, you will receive your results only by: MyChart Message (if you have MyChart) OR A paper copy in the mail If you have any lab test that is abnormal or we need to change your treatment, we will call you to review the results.  Testing/Procedures: NONE  Follow-Up: At [facility], you and your health needs are our priority.  As part of our continuing mission to provide you with exceptional heart care, our providers are all part of one team.  This team includes your primary Cardiologist (physician) and Advanced Practice Providers or APPs (Physician Assistants and Nurse Practitioners) who all work together to provide you with the care you need, when you need it.  Your next appointment:   5 month(s)  Provider:   Redell Shallow, MD or Katlyn West, NP

## 2025-04-01 ENCOUNTER — Ambulatory Visit: Admitting: Cardiology
